# Patient Record
Sex: Female | Born: 1955 | Race: Black or African American | Hispanic: No | Marital: Married | Smoking: Former smoker
Health system: Southern US, Community
[De-identification: ages and names within clinical notes are randomized; demographics above are authoritative.]

## PROBLEM LIST (undated history)

## (undated) DIAGNOSIS — E119 Type 2 diabetes mellitus without complications: Secondary | ICD-10-CM

## (undated) DIAGNOSIS — E118 Type 2 diabetes mellitus with unspecified complications: Secondary | ICD-10-CM

## (undated) DIAGNOSIS — M069 Rheumatoid arthritis, unspecified: Secondary | ICD-10-CM

## (undated) DIAGNOSIS — H269 Unspecified cataract: Secondary | ICD-10-CM

## (undated) DIAGNOSIS — I10 Essential (primary) hypertension: Secondary | ICD-10-CM

## (undated) DIAGNOSIS — E11319 Type 2 diabetes mellitus with unspecified diabetic retinopathy without macular edema: Secondary | ICD-10-CM

## (undated) DIAGNOSIS — M1990 Unspecified osteoarthritis, unspecified site: Secondary | ICD-10-CM

## (undated) HISTORY — DX: Unspecified cataract: H26.9

## (undated) HISTORY — DX: Type 2 diabetes mellitus with unspecified diabetic retinopathy without macular edema: E11.319

## (undated) HISTORY — DX: Type 2 diabetes mellitus with unspecified complications: E11.8

## (undated) HISTORY — PX: OTHER SURGICAL HISTORY: SHX169

---

## 1998-09-16 ENCOUNTER — Encounter: Payer: Self-pay | Admitting: Internal Medicine

## 1998-09-16 ENCOUNTER — Ambulatory Visit (HOSPITAL_COMMUNITY): Admission: RE | Admit: 1998-09-16 | Discharge: 1998-09-16 | Payer: Self-pay | Admitting: Internal Medicine

## 1999-03-02 ENCOUNTER — Inpatient Hospital Stay (HOSPITAL_COMMUNITY): Admission: AD | Admit: 1999-03-02 | Discharge: 1999-03-02 | Payer: Self-pay | Admitting: Obstetrics

## 1999-03-14 ENCOUNTER — Other Ambulatory Visit: Admission: RE | Admit: 1999-03-14 | Discharge: 1999-03-14 | Payer: Self-pay | Admitting: Obstetrics

## 2000-05-04 ENCOUNTER — Inpatient Hospital Stay (HOSPITAL_COMMUNITY): Admission: AD | Admit: 2000-05-04 | Discharge: 2000-05-04 | Payer: Self-pay | Admitting: Obstetrics

## 2003-06-02 ENCOUNTER — Encounter: Admission: RE | Admit: 2003-06-02 | Discharge: 2003-06-02 | Payer: Self-pay | Admitting: Internal Medicine

## 2003-07-09 ENCOUNTER — Encounter: Admission: RE | Admit: 2003-07-09 | Discharge: 2003-07-09 | Payer: Self-pay | Admitting: Internal Medicine

## 2003-07-19 ENCOUNTER — Encounter: Admission: RE | Admit: 2003-07-19 | Discharge: 2003-10-17 | Payer: Self-pay | Admitting: Internal Medicine

## 2003-07-23 ENCOUNTER — Encounter: Admission: RE | Admit: 2003-07-23 | Discharge: 2003-07-23 | Payer: Self-pay | Admitting: Internal Medicine

## 2003-08-16 ENCOUNTER — Encounter: Admission: RE | Admit: 2003-08-16 | Discharge: 2003-08-16 | Payer: Self-pay | Admitting: Internal Medicine

## 2005-02-15 ENCOUNTER — Ambulatory Visit: Payer: Self-pay | Admitting: Internal Medicine

## 2005-03-14 ENCOUNTER — Ambulatory Visit: Payer: Self-pay | Admitting: Internal Medicine

## 2008-08-03 ENCOUNTER — Emergency Department (HOSPITAL_COMMUNITY): Admission: EM | Admit: 2008-08-03 | Discharge: 2008-08-03 | Payer: Self-pay | Admitting: Emergency Medicine

## 2009-08-03 ENCOUNTER — Emergency Department (HOSPITAL_COMMUNITY): Admission: EM | Admit: 2009-08-03 | Discharge: 2009-08-04 | Payer: Self-pay | Admitting: Emergency Medicine

## 2009-10-15 ENCOUNTER — Emergency Department (HOSPITAL_COMMUNITY): Admission: EM | Admit: 2009-10-15 | Discharge: 2009-10-16 | Payer: Self-pay | Admitting: Emergency Medicine

## 2009-10-17 ENCOUNTER — Inpatient Hospital Stay (HOSPITAL_COMMUNITY): Admission: EM | Admit: 2009-10-17 | Discharge: 2009-10-20 | Payer: Self-pay | Admitting: Emergency Medicine

## 2011-02-20 LAB — GLUCOSE, CAPILLARY
Glucose-Capillary: 106 mg/dL — ABNORMAL HIGH (ref 70–99)
Glucose-Capillary: 112 mg/dL — ABNORMAL HIGH (ref 70–99)
Glucose-Capillary: 141 mg/dL — ABNORMAL HIGH (ref 70–99)

## 2011-02-20 LAB — BASIC METABOLIC PANEL
BUN: 6 mg/dL (ref 6–23)
Creatinine, Ser: 0.7 mg/dL (ref 0.4–1.2)
GFR calc non Af Amer: >60 mL/min (ref >60)
Glucose, Bld: 125 mg/dL — ABNORMAL HIGH (ref 70–99)
Potassium: 3.6 mEq/L (ref 3.5–5.1)

## 2011-02-20 LAB — CBC
HCT: 44.1 % (ref 36.0–46.0)
MCV: 89.1 fL (ref 78.0–100.0)
Platelets: 232 10*3/uL (ref 150–400)
RDW: 13.1 % (ref 11.5–15.5)

## 2011-02-21 LAB — COMPREHENSIVE METABOLIC PANEL
ALT: 15 U/L (ref 0–35)
AST: 14 U/L (ref 0–37)
Albumin: 3.8 g/dL (ref 3.5–5.2)
BUN: 9 mg/dL (ref 6–23)
CO2: 27 mEq/L (ref 19–32)
Calcium: 8.8 mg/dL (ref 8.4–10.5)
Calcium: 8.9 mg/dL (ref 8.4–10.5)
Creatinine, Ser: 0.68 mg/dL (ref 0.4–1.2)
GFR calc Af Amer: >60 mL/min (ref >60)
GFR calc non Af Amer: >60 mL/min (ref >60)
Glucose, Bld: 202 mg/dL — ABNORMAL HIGH (ref 70–99)
Glucose, Bld: 218 mg/dL — ABNORMAL HIGH (ref 70–99)
Potassium: 3.5 mEq/L (ref 3.5–5.1)
Sodium: 139 mEq/L (ref 135–145)
Total Protein: 7.9 g/dL (ref 6.0–8.3)

## 2011-02-21 LAB — DIFFERENTIAL
Basophils Absolute: 0 10*3/uL (ref 0.0–0.1)
Basophils Absolute: 0 10*3/uL (ref 0.0–0.1)
Basophils Relative: 0 % (ref 0–1)
Basophils Relative: 0 % (ref 0–1)
Eosinophils Absolute: 0.1 10*3/uL (ref 0.0–0.7)
Eosinophils Relative: 0 % (ref 0–5)
Lymphocytes Relative: 5 % — ABNORMAL LOW (ref 12–46)
Lymphs Abs: 0.6 10*3/uL — ABNORMAL LOW (ref 0.7–4.0)
Neutro Abs: 8.6 10*3/uL — ABNORMAL HIGH (ref 1.7–7.7)
Neutrophils Relative %: 79 % — ABNORMAL HIGH (ref 43–77)
Neutrophils Relative %: 95 % — ABNORMAL HIGH (ref 43–77)

## 2011-02-21 LAB — POCT I-STAT, CHEM 8
BUN: 9 mg/dL (ref 6–23)
Calcium, Ion: 1.11 mmol/L — ABNORMAL LOW (ref 1.12–1.32)
Chloride: 99 mEq/L (ref 96–112)
Glucose, Bld: 206 mg/dL — ABNORMAL HIGH (ref 70–99)
HCT: 48 % — ABNORMAL HIGH (ref 36.0–46.0)
Potassium: 3.5 mEq/L (ref 3.5–5.1)

## 2011-02-21 LAB — BASIC METABOLIC PANEL
BUN: 6 mg/dL (ref 6–23)
CO2: 26 mEq/L (ref 19–32)
Creatinine, Ser: 0.58 mg/dL (ref 0.4–1.2)
GFR calc Af Amer: >60 mL/min (ref >60)
Glucose, Bld: 157 mg/dL — ABNORMAL HIGH (ref 70–99)
Sodium: 136 mEq/L (ref 135–145)

## 2011-02-21 LAB — CBC
HCT: 44.7 % (ref 36.0–46.0)
Hemoglobin: 14.3 g/dL (ref 12.0–15.0)
MCHC: 33.7 g/dL (ref 30.0–36.0)
MCHC: 33.9 g/dL (ref 30.0–36.0)
MCV: 88.9 fL (ref 78.0–100.0)
MCV: 89.2 fL (ref 78.0–100.0)
MCV: 89.8 fL (ref 78.0–100.0)
Platelets: 213 10*3/uL (ref 150–400)
Platelets: 231 10*3/uL (ref 150–400)
RBC: 4.74 MIL/uL (ref 3.87–5.11)
RDW: 12.7 % (ref 11.5–15.5)
RDW: 13.3 % (ref 11.5–15.5)

## 2011-02-21 LAB — URINALYSIS, ROUTINE W REFLEX MICROSCOPIC
Bilirubin Urine: NEGATIVE
Glucose, UA: 500 mg/dL — AB
Nitrite: NEGATIVE
Nitrite: NEGATIVE
Specific Gravity, Urine: 1.019 (ref 1.005–1.030)
Specific Gravity, Urine: 1.021 (ref 1.005–1.030)
pH: 7 (ref 5.0–8.0)
pH: 7.5 (ref 5.0–8.0)

## 2011-02-21 LAB — GLUCOSE, CAPILLARY
Glucose-Capillary: 129 mg/dL — ABNORMAL HIGH (ref 70–99)
Glucose-Capillary: 135 mg/dL — ABNORMAL HIGH (ref 70–99)
Glucose-Capillary: 144 mg/dL — ABNORMAL HIGH (ref 70–99)
Glucose-Capillary: 176 mg/dL — ABNORMAL HIGH (ref 70–99)

## 2011-02-21 LAB — APTT: aPTT: 31 seconds (ref 24–37)

## 2011-02-21 LAB — PROTIME-INR: INR: 1.1 (ref 0.00–1.49)

## 2011-02-21 LAB — URINE CULTURE
Colony Count: NO GROWTH
Culture: NO GROWTH

## 2011-02-21 LAB — HEMOGLOBIN A1C: Hgb A1c MFr Bld: 7.1 % — ABNORMAL HIGH (ref 4.6–6.1)

## 2011-02-21 LAB — LIPASE, BLOOD: Lipase: 15 U/L (ref 11–59)

## 2011-02-23 LAB — BASIC METABOLIC PANEL
BUN: 9 mg/dL (ref 6–23)
CO2: 26 mEq/L (ref 19–32)
Chloride: 106 mEq/L (ref 96–112)
Potassium: 3.7 mEq/L (ref 3.5–5.1)

## 2011-02-23 LAB — DIFFERENTIAL
Eosinophils Absolute: 0.2 10*3/uL (ref 0.0–0.7)
Eosinophils Relative: 2 % (ref 0–5)
Lymphs Abs: 1.9 10*3/uL (ref 0.7–4.0)
Monocytes Relative: 5 % (ref 3–12)

## 2011-02-23 LAB — URINALYSIS, ROUTINE W REFLEX MICROSCOPIC
Bilirubin Urine: NEGATIVE
Glucose, UA: NEGATIVE mg/dL
Hgb urine dipstick: NEGATIVE
Nitrite: POSITIVE — AB
Specific Gravity, Urine: 1.011 (ref 1.005–1.030)
pH: 6.5 (ref 5.0–8.0)

## 2011-02-23 LAB — CBC
HCT: 42.3 % (ref 36.0–46.0)
MCV: 90.1 fL (ref 78.0–100.0)
Platelets: 220 10*3/uL (ref 150–400)
RBC: 4.69 MIL/uL (ref 3.87–5.11)
WBC: 11 10*3/uL — ABNORMAL HIGH (ref 4.0–10.5)

## 2011-02-23 LAB — URINE MICROSCOPIC-ADD ON

## 2011-02-23 LAB — URINE CULTURE

## 2011-08-20 LAB — HEPATIC FUNCTION PANEL
Albumin: 3.7
Total Bilirubin: 0.8
Total Protein: 7.4

## 2011-08-20 LAB — URINALYSIS, ROUTINE W REFLEX MICROSCOPIC
Glucose, UA: NEGATIVE
Ketones, ur: NEGATIVE
Leukocytes, UA: NEGATIVE
Nitrite: POSITIVE — AB
Protein, ur: NEGATIVE
Urobilinogen, UA: 1

## 2011-08-20 LAB — CBC
MCHC: 33.7
MCV: 86.8
Platelets: 236

## 2011-08-20 LAB — DIFFERENTIAL
Basophils Relative: 1
Eosinophils Absolute: 0.2
Neutrophils Relative %: 63

## 2011-08-20 LAB — BASIC METABOLIC PANEL
BUN: 16
CO2: 23
Chloride: 109
Creatinine, Ser: 0.7

## 2011-08-20 LAB — LIPASE, BLOOD: Lipase: 33

## 2012-11-08 ENCOUNTER — Encounter (HOSPITAL_COMMUNITY): Payer: Self-pay | Admitting: *Deleted

## 2012-11-08 ENCOUNTER — Emergency Department (HOSPITAL_COMMUNITY)
Admission: EM | Admit: 2012-11-08 | Discharge: 2012-11-08 | Disposition: A | Discharge status: Home / Self Care | Payer: Self-pay | Attending: Emergency Medicine | Admitting: Emergency Medicine

## 2012-11-08 ENCOUNTER — Emergency Department (HOSPITAL_COMMUNITY): Payer: Self-pay

## 2012-11-08 DIAGNOSIS — F17200 Nicotine dependence, unspecified, uncomplicated: Secondary | ICD-10-CM | POA: Insufficient documentation

## 2012-11-08 DIAGNOSIS — E119 Type 2 diabetes mellitus without complications: Secondary | ICD-10-CM | POA: Insufficient documentation

## 2012-11-08 DIAGNOSIS — M25512 Pain in left shoulder: Secondary | ICD-10-CM

## 2012-11-08 DIAGNOSIS — M25519 Pain in unspecified shoulder: Secondary | ICD-10-CM | POA: Insufficient documentation

## 2012-11-08 DIAGNOSIS — I10 Essential (primary) hypertension: Secondary | ICD-10-CM | POA: Insufficient documentation

## 2012-11-08 DIAGNOSIS — Z8739 Personal history of other diseases of the musculoskeletal system and connective tissue: Secondary | ICD-10-CM | POA: Insufficient documentation

## 2012-11-08 DIAGNOSIS — Z7982 Long term (current) use of aspirin: Secondary | ICD-10-CM | POA: Insufficient documentation

## 2012-11-08 HISTORY — DX: Essential (primary) hypertension: I10

## 2012-11-08 HISTORY — DX: Unspecified osteoarthritis, unspecified site: M19.90

## 2012-11-08 HISTORY — DX: Type 2 diabetes mellitus without complications: E11.9

## 2012-11-08 MED ORDER — IBUPROFEN 800 MG PO TABS: 800.0000 mg | ORAL_TABLET | Freq: Three times a day (TID) | ORAL | Status: DC | PRN

## 2012-11-08 MED ORDER — HYDROCODONE-ACETAMINOPHEN 5-325 MG PO TABS: 1.0000 | ORAL_TABLET | Freq: Four times a day (QID) | ORAL | Status: DC | PRN

## 2012-11-08 MED ORDER — HYDROCODONE-ACETAMINOPHEN 5-325 MG PO TABS
1.0000 | ORAL_TABLET | Freq: Once | ORAL | Status: AC
  Administered 2012-11-08: 1 via ORAL
  Filled 2012-11-08: qty 1

## 2012-11-08 MED ORDER — KETOROLAC TROMETHAMINE 60 MG/2ML IM SOLN
60.0000 mg | Freq: Once | INTRAMUSCULAR | Status: AC
  Administered 2012-11-08: 60 mg via INTRAMUSCULAR
  Filled 2012-11-08: qty 2

## 2012-11-08 NOTE — ED Provider Notes (Signed)
History     CSN: [id number]  Arrival date & time 11/08/12  0114   First MD Initiated Contact with Patient 11/08/12 0139      Chief Complaint  Patient presents with  . Shoulder Pain    Left "shoulder blade"    (Consider location/radiation/quality/duration/timing/severity/associated sxs/prior treatment) HPI Patient presents to the emergency department with left shoulder blade pain. Patient states that she was straining to have a bowel movement 2 days ago, and then noticed shoulder pain several hours later. Patient denies shortness of breath, chest pain, nausea, vomiting, headache, neck pain, weakness, numbness, fever, or syncope.  Patient, states that movement makes the pain, worse.  Nothing seems to make the pain better.  Patient, states she didn't take anything prior to arrival, for her symptoms.  Past Medical History  Diagnosis Date  . Hypertension   . Diabetes mellitus without complication   . Arthritis     History reviewed. No pertinent past surgical history.  History reviewed. No pertinent family history.  History  Substance Use Topics  . Smoking status: Current Every Day Smoker -- 0.5 packs/day  . Smokeless tobacco: Never Used  . Alcohol Use: Yes     Comment: occ    OB History    Grav Para Term Preterm Abortions TAB SAB Ect Mult Living                  Review of Systems All other systems negative except as documented in the HPI. All pertinent positives and negatives as reviewed in the HPI.  Allergies  Review of patient's allergies indicates no known allergies.  Home Medications   Current Outpatient Rx  Name  Route  Sig  Dispense  Refill  . ASPIRIN EC 81 MG PO TBEC   Oral   Take 81 mg by mouth every Monday, Wednesday, and Friday.         . ALEVE PO   Oral   Take 1 tablet by mouth daily as needed. For pain         . PSEUDOEPHEDRINE HCL 30 MG PO TABS   Oral   Take 30 mg by mouth every 4 (four) hours as needed. For sinus         . SALINE 0.65 %  NA SOLN   Nasal   Place 1 spray into the nose daily as needed. For nasal drainage           BP 206/109  Pulse 92  Temp 98.5 F (36.9 C) (Oral)  Resp 15  Ht 5\' 3"  (1.6 m)  Wt 162 lb (73.483 kg)  BMI 28.70 kg/m2  SpO2 100%  Physical Exam  Constitutional: She is oriented to person, place, and time. She appears well-developed and well-nourished. No distress.  HENT:  Head: Normocephalic and atraumatic.  Eyes: Pupils are equal, round, and reactive to light.  Cardiovascular: Normal rate, regular rhythm and normal heart sounds.  Exam reveals no gallop and no friction rub.   No murmur heard. Pulmonary/Chest: Effort normal and breath sounds normal. No respiratory distress. She has no wheezes. She has no rales.  Musculoskeletal:       Arms: Neurological: She is alert and oriented to person, place, and time. She has normal strength. Coordination and gait normal. GCS eye subscore is 4. GCS verbal subscore is 5. GCS motor subscore is 6.  Skin: Skin is warm and dry. No rash noted.    ED Course  Procedures (including critical care time)  Labs Reviewed - No  data to display Dg Chest 2 View  11/08/2012  *RADIOLOGY REPORT*  Clinical Data: Left shoulder pain  CHEST - 2 VIEW  Comparison: 11/23/2009  Findings: Cardiomediastinal contours are unchanged, within normal range.  No confluent airspace opacity, pleural effusion, or pneumothorax.  No acute osseous finding.  IMPRESSION: No radiographic evidence of acute cardiopulmonary process.   Original Report Authenticated By: Jearld Lesch, M.D.      Patient be treated for pain involving the area of her left shoulder blade. the patient is  PERC negative and does not have any chest pain. The patient states that movement makes the pain worse. This seems most likely a musculoskeletal issue of her left shoulder and shoulder blade.   MDM   Date: 11/08/2012  Rate: 89  Rhythm: normal sinus rhythm  QRS Axis: normal  Intervals: normal  ST/T Wave  abnormalities: normal  Conduction Disutrbances:LVH  Narrative Interpretation:   Old EKG Reviewed: none available          Carlyle Dolly, PA-C 11/08/12 0350

## 2012-11-08 NOTE — ED Provider Notes (Signed)
Medical screening examination/treatment/procedure(s) were performed by non-physician practitioner and as supervising physician I was immediately available for consultation/collaboration.   Lyanne Co, MD 11/08/12 [phone]

## 2012-11-08 NOTE — ED Notes (Signed)
Pt from home with reports of left shoulder blade pain that started yesterday. Pt denies other symptoms but reports that she has been constipated with straining over the last few days and wonders if she has pulled a muscle. Pt reports hx of high blood pressure but recently lost health insurance so has not been able to afford medication over the last 6 months.

## 2016-11-29 ENCOUNTER — Encounter (HOSPITAL_COMMUNITY): Payer: Self-pay

## 2016-11-29 ENCOUNTER — Emergency Department (HOSPITAL_COMMUNITY)
Admission: EM | Admit: 2016-11-29 | Discharge: 2016-11-30 | Disposition: A | Discharge status: Home / Self Care | Payer: Medicare Other | Attending: Emergency Medicine | Admitting: Emergency Medicine

## 2016-11-29 ENCOUNTER — Emergency Department (HOSPITAL_COMMUNITY): Payer: Medicare Other

## 2016-11-29 DIAGNOSIS — F17200 Nicotine dependence, unspecified, uncomplicated: Secondary | ICD-10-CM | POA: Insufficient documentation

## 2016-11-29 DIAGNOSIS — Z79899 Other long term (current) drug therapy: Secondary | ICD-10-CM | POA: Insufficient documentation

## 2016-11-29 DIAGNOSIS — R110 Nausea: Secondary | ICD-10-CM | POA: Diagnosis not present

## 2016-11-29 DIAGNOSIS — I10 Essential (primary) hypertension: Secondary | ICD-10-CM | POA: Diagnosis not present

## 2016-11-29 DIAGNOSIS — E119 Type 2 diabetes mellitus without complications: Secondary | ICD-10-CM | POA: Diagnosis not present

## 2016-11-29 DIAGNOSIS — R1033 Periumbilical pain: Secondary | ICD-10-CM | POA: Diagnosis present

## 2016-11-29 DIAGNOSIS — R1084 Generalized abdominal pain: Secondary | ICD-10-CM | POA: Insufficient documentation

## 2016-11-29 DIAGNOSIS — Z7982 Long term (current) use of aspirin: Secondary | ICD-10-CM | POA: Diagnosis not present

## 2016-11-29 DIAGNOSIS — F17210 Nicotine dependence, cigarettes, uncomplicated: Secondary | ICD-10-CM | POA: Diagnosis not present

## 2016-11-29 DIAGNOSIS — R112 Nausea with vomiting, unspecified: Secondary | ICD-10-CM | POA: Diagnosis not present

## 2016-11-29 LAB — CBC WITH DIFFERENTIAL/PLATELET
BASOS ABS: 0 10*3/uL (ref 0.0–0.1)
BASOS PCT: 0 %
Eosinophils Absolute: 0 10*3/uL (ref 0.0–0.7)
Eosinophils Relative: 0 %
HEMATOCRIT: 44.1 % (ref 36.0–46.0)
HEMOGLOBIN: 14.9 g/dL (ref 12.0–15.0)
Lymphocytes Relative: 7 %
Lymphs Abs: 0.9 10*3/uL (ref 0.7–4.0)
MCH: 28.5 pg (ref 26.0–34.0)
MCHC: 33.8 g/dL (ref 30.0–36.0)
MCV: 84.5 fL (ref 78.0–100.0)
Monocytes Absolute: 0.2 10*3/uL (ref 0.1–1.0)
Monocytes Relative: 2 %
NEUTROS ABS: 11.6 10*3/uL — AB (ref 1.7–7.7)
NEUTROS PCT: 91 %
Platelets: 264 10*3/uL (ref 150–400)
RBC: 5.22 MIL/uL — ABNORMAL HIGH (ref 3.87–5.11)
RDW: 14.1 % (ref 11.5–15.5)
WBC: 12.7 10*3/uL — AB (ref 4.0–10.5)

## 2016-11-29 LAB — COMPREHENSIVE METABOLIC PANEL
ALBUMIN: 4 g/dL (ref 3.5–5.0)
ALK PHOS: 120 U/L (ref 38–126)
ALT: 11 U/L — AB (ref 14–54)
ANION GAP: 13 (ref 5–15)
AST: 19 U/L (ref 15–41)
BUN: 13 mg/dL (ref 6–20)
CALCIUM: 9.9 mg/dL (ref 8.9–10.3)
CHLORIDE: 102 mmol/L (ref 101–111)
CO2: 23 mmol/L (ref 22–32)
Creatinine, Ser: 0.95 mg/dL (ref 0.44–1.00)
GFR calc Af Amer: >60 mL/min (ref >60)
GFR calc non Af Amer: >60 mL/min (ref >60)
GLUCOSE: 265 mg/dL — AB (ref 65–99)
Potassium: 3.7 mmol/L (ref 3.5–5.1)
SODIUM: 138 mmol/L (ref 135–145)
Total Bilirubin: 1 mg/dL (ref 0.3–1.2)
Total Protein: 9 g/dL — ABNORMAL HIGH (ref 6.5–8.1)

## 2016-11-29 LAB — URINALYSIS, ROUTINE W REFLEX MICROSCOPIC
Bilirubin Urine: NEGATIVE
Glucose, UA: >=500 mg/dL — AB
KETONES UR: 20 mg/dL — AB
Leukocytes, UA: NEGATIVE
Nitrite: NEGATIVE
PROTEIN: 100 mg/dL — AB
Specific Gravity, Urine: 1.015 (ref 1.005–1.030)
pH: 6 (ref 5.0–8.0)

## 2016-11-29 LAB — I-STAT TROPONIN, ED: Troponin i, poc: 0 ng/mL (ref 0.00–0.08)

## 2016-11-29 LAB — CBC
HEMATOCRIT: 42.4 % (ref 36.0–46.0)
Hemoglobin: 14.3 g/dL (ref 12.0–15.0)
MCH: 28.8 pg (ref 26.0–34.0)
MCHC: 33.7 g/dL (ref 30.0–36.0)
MCV: 85.5 fL (ref 78.0–100.0)
Platelets: 275 10*3/uL (ref 150–400)
RBC: 4.96 MIL/uL (ref 3.87–5.11)
RDW: 14.2 % (ref 11.5–15.5)
WBC: 10.9 10*3/uL — ABNORMAL HIGH (ref 4.0–10.5)

## 2016-11-29 LAB — LIPASE, BLOOD: Lipase: 22 U/L (ref 11–51)

## 2016-11-29 LAB — SEDIMENTATION RATE: SED RATE: 80 mm/h — AB (ref 0–22)

## 2016-11-29 MED ORDER — ONDANSETRON 4 MG PO TBDP
ORAL_TABLET | ORAL | Status: DC
Start: 2016-11-29 — End: 2016-11-30
  Filled 2016-11-29: qty 1

## 2016-11-29 MED ORDER — IOPAMIDOL (ISOVUE-300) INJECTION 61%
INTRAVENOUS | Status: AC
  Administered 2016-11-29: 100 mL
  Filled 2016-11-29: qty 100

## 2016-11-29 MED ORDER — DIPHENHYDRAMINE HCL 50 MG/ML IJ SOLN
12.5000 mg | Freq: Once | INTRAMUSCULAR | Status: AC
  Administered 2016-11-29: 12.5 mg via INTRAVENOUS
  Filled 2016-11-29: qty 1

## 2016-11-29 MED ORDER — METOCLOPRAMIDE HCL 10 MG PO TABS: 5.0000 mg | ORAL_TABLET | Freq: Four times a day (QID) | ORAL | 0 refills | Status: DC | PRN

## 2016-11-29 MED ORDER — AMLODIPINE BESYLATE 10 MG PO TABS: 10.0000 mg | ORAL_TABLET | Freq: Every day | ORAL | 0 refills | Status: DC

## 2016-11-29 MED ORDER — AMLODIPINE BESYLATE 5 MG PO TABS
10.0000 mg | ORAL_TABLET | Freq: Once | ORAL | Status: AC
  Administered 2016-11-29: 10 mg via ORAL
  Filled 2016-11-29: qty 2

## 2016-11-29 MED ORDER — ONDANSETRON 4 MG PO TBDP
4.0000 mg | ORAL_TABLET | Freq: Once | ORAL | Status: AC | PRN
  Administered 2016-11-29: 4 mg via ORAL

## 2016-11-29 MED ORDER — DIPHENHYDRAMINE HCL 25 MG PO TABS: 12.5000 mg | ORAL_TABLET | Freq: Four times a day (QID) | ORAL | 0 refills | Status: DC | PRN

## 2016-11-29 MED ORDER — ONDANSETRON HCL 4 MG/2ML IJ SOLN
4.0000 mg | Freq: Four times a day (QID) | INTRAMUSCULAR | Status: DC | PRN
  Administered 2016-11-29: 4 mg via INTRAVENOUS
  Filled 2016-11-29: qty 2

## 2016-11-29 MED ORDER — METOCLOPRAMIDE HCL 5 MG/ML IJ SOLN
5.0000 mg | Freq: Once | INTRAMUSCULAR | Status: AC
  Administered 2016-11-29: 5 mg via INTRAVENOUS
  Filled 2016-11-29: qty 2

## 2016-11-29 NOTE — ED Provider Notes (Signed)
Patient was given 10 mg of Norvasc in the emergency department, after which she was monitored for a period time, her blood pressure came down to 163/90 at time of discharge, she was also given 5 mg of Reglan and 12.5 mg of Benadryl IV for persistent nausea with resolution of her symptoms.  She is sent home with prescriptions for all of these medicines by mouth.  She and her family state that she will follow-up with Dr. Eula Listen next week   Earley Favor, NP 11/29/16 2351    Nira Conn, MD 12/03/16 [phone]

## 2016-11-29 NOTE — ED Triage Notes (Signed)
Per Pt, Pt is coming from home with complaints of left abdominal pain since yesterday with nausea and vomiting that started today. Reports some diarrhea.

## 2016-11-29 NOTE — ED Notes (Signed)
Pt pulled out iv.

## 2016-11-29 NOTE — Discharge Instructions (Addendum)
It is very important that you take care of blood pressure medicine daily.  You've also been given prescription for medication called Reglan.  You can take this as needed every 6 hours for nausea.  I recommend that you take it with a Benadryl as well

## 2016-11-29 NOTE — ED Provider Notes (Signed)
[facility]-EMERGENCY DEPT Provider Note   CSN: [id number] Arrival date & time: 11/29/16  1447     History   Chief Complaint Chief Complaint  Patient presents with  . Abdominal Pain    HPI Maria Foster is a 61 y.o. female presenting with abdominal pain which started 24 hours ago and has been worsening she states that she woke up this morning and has had multiple nonbloody soft bowel movements and vomiting. She cannot point to a specific area of her stomach or it hurts. She generally points to the periumbilical area. It is worse when she sits up and moves slightly better when she lies on her side. She also endorses sinus congestion, chills and states that she is achy all over. She has not tried anything for pain and hasn't had any antipyretics today. She denies any history of ill contacts. Denies melena, dysuria, hematuria, chest pain, shortness of breath, or blood in her stool.  She states that her hypertension and diabetes had been managed with lifestyle changes she is not currently taking any medications. Her last PCP visit was more than a year ago.  HPI  Past Medical History:  Diagnosis Date  . Arthritis   . Diabetes mellitus without complication (HCC)   . Hypertension     There are no active problems to display for this patient.   History reviewed. No pertinent surgical history.  OB History    No data available       Home Medications    Prior to Admission medications   Medication Sig Start Date End Date Taking? Authorizing Provider  aspirin EC 81 MG tablet Take 81 mg by mouth every Monday, Wednesday, and Friday.   Yes Historical Provider, MD  naproxen sodium (ANAPROX) 220 MG tablet Take 220 mg by mouth daily as needed (pain).   Yes Historical Provider, MD    Family History No family history on file.  Social History Social History  Substance Use Topics  . Smoking status: Current Every Day Smoker    Packs/day: 0.50  . Smokeless tobacco: Never Used  . Alcohol use  Yes     Comment: occ     Allergies   Patient has no known allergies.   Review of Systems Review of Systems  Constitutional: Negative for chills and fever.  HENT: Positive for congestion and sinus pressure. Negative for ear pain, facial swelling, hearing loss, sinus pain, sneezing, sore throat and trouble swallowing.   Eyes: Negative for pain and visual disturbance.  Respiratory: Negative for cough, chest tightness, shortness of breath, wheezing and stridor.   Cardiovascular: Negative for chest pain, palpitations and leg swelling.  Gastrointestinal: Positive for abdominal pain, nausea and vomiting. Negative for abdominal distention, blood in stool and diarrhea.  Genitourinary: Negative for difficulty urinating, dysuria, flank pain, frequency, hematuria, pelvic pain and urgency.  Musculoskeletal: Positive for myalgias. Negative for arthralgias, back pain, neck pain and neck stiffness.  Skin: Negative for color change, pallor, rash and wound.  Neurological: Negative for dizziness, seizures, syncope, facial asymmetry, weakness, light-headedness, numbness and headaches.  All other systems reviewed and are negative.    Physical Exam Updated Vital Signs BP (!) 188/113 (BP Location: Left Arm)   Pulse 97   Temp 97.7 F (36.5 C) (Oral)   Resp 17   Ht 5\' 4"  (1.626 m)   Wt 73.5 kg   SpO2 100%   BMI 27.81 kg/m   Physical Exam  Constitutional: She appears well-developed and well-nourished. No distress.  Afebrile, nontoxic  appearing, lying in bed in discomfort  HENT:  Head: Normocephalic and atraumatic.  Mouth/Throat: Oropharynx is clear and moist. No oropharyngeal exudate.  No sinus tenderness  Eyes: Conjunctivae and EOM are normal. Pupils are equal, round, and reactive to light. Right eye exhibits no discharge. Left eye exhibits no discharge.  Neck: Normal range of motion. Neck supple.  Cardiovascular: Normal rate, regular rhythm and normal heart sounds.   No murmur  heard. Pulmonary/Chest: Effort normal and breath sounds normal. No respiratory distress. She has no wheezes. She has no rales.  Abdominal: Soft. Bowel sounds are normal. She exhibits no distension and no mass. There is tenderness. There is no rebound and no guarding.  Patient reports diffuse discomfort in her abdominal area. Negative Murphy sign, negative McBurney's point tenderness negative rebound.  Musculoskeletal: She exhibits no edema.  Neurological: She is alert. No cranial nerve deficit or sensory deficit. She exhibits normal muscle tone. Coordination normal.  Skin: Skin is warm and dry. No rash noted. She is not diaphoretic. No erythema. No pallor.  Psychiatric: She has a normal mood and affect. Her behavior is normal.  Nursing note and vitals reviewed.    ED Treatments / Results  Labs (all labs ordered are listed, but only abnormal results are displayed) Labs Reviewed  COMPREHENSIVE METABOLIC PANEL - Abnormal; Notable for the following:       Result Value   Glucose, Bld 265 (*)    Total Protein 9.0 (*)    ALT 11 (*)    All other components within normal limits  CBC - Abnormal; Notable for the following:    WBC 10.9 (*)    All other components within normal limits  URINALYSIS, ROUTINE W REFLEX MICROSCOPIC - Abnormal; Notable for the following:    APPearance HAZY (*)    Glucose, UA >=500 (*)    Hgb urine dipstick SMALL (*)    Ketones, ur 20 (*)    Protein, ur 100 (*)    Bacteria, UA RARE (*)    Squamous Epithelial / LPF 0-5 (*)    All other components within normal limits  CBC WITH DIFFERENTIAL/PLATELET - Abnormal; Notable for the following:    WBC 12.7 (*)    RBC 5.22 (*)    Neutro Abs 11.6 (*)    All other components within normal limits  LIPASE, BLOOD  SEDIMENTATION RATE  I-STAT TROPOININ, ED    EKG  EKG Interpretation None       Radiology Ct Abdomen Pelvis W Contrast  Result Date: 11/29/2016 CLINICAL DATA:  Diffuse abdominal pain with nausea vomiting.  EXAM: CT ABDOMEN AND PELVIS WITH CONTRAST TECHNIQUE: Multidetector CT imaging of the abdomen and pelvis was performed using the standard protocol following bolus administration of intravenous contrast. CONTRAST:  ISOVUE-300 IOPAMIDOL (ISOVUE-300) INJECTION 61% COMPARISON:  10/17/2009 FINDINGS: Lower chest:  Unremarkable. Hepatobiliary: Small area of low attenuation in the anterior liver, adjacent to the falciform ligament, is in a characteristic location for focal fatty deposition and is unchanged in the interval. Liver parenchyma otherwise unremarkable. There is no evidence for gallstones, gallbladder wall thickening, or pericholecystic fluid. No intrahepatic or extrahepatic biliary dilation. Pancreas: No focal mass lesion. No dilatation of the main duct. No intraparenchymal cyst. No peripancreatic edema. Spleen: No splenomegaly. No focal mass lesion. Adrenals/Urinary Tract: No adrenal nodule or mass. The kidneys are unremarkable. No evidence for hydroureter. The urinary bladder appears normal for the degree of distention. Stomach/Bowel: Tiny hiatal hernia. Stomach unremarkable. Duodenum is normally positioned as is  the ligament of Treitz. No small bowel wall thickening. No small bowel dilatation. The terminal ileum is normal. The appendix is normal. No gross colonic mass. No colonic wall thickening. No substantial diverticular change. Vascular/Lymphatic: There is abdominal aortic atherosclerosis without aneurysm. There is a cuff of soft tissue attenuation encasing the proximal superior mesenteric artery with some apparent thickening in the wall of the proximal SMA (well seen on axial image 22 of series 201 and coronal image 36 of series 206). Celiac axis appears largely preserved than the IMA has normal imaging features. Reproductive: The uterus has normal CT imaging appearance. There is no adnexal mass. Other: No intraperitoneal free fluid. Musculoskeletal: Bone windows reveal no worrisome lytic or  sclerotic osseous lesions. IMPRESSION: 1. Circumferential cuff of soft tissue attenuation with apparent wall thickening in the proximal SMA. These imaging features can be seen in cases of SMA vasculitis. This appearance can also be seen in cases of pancreatic adenocarcinoma, presenting with only abnormal soft tissue encasing the celiac axis or SMA and no identifiable intra pancreatic lesion. Close follow-up is recommended I discussed these results by telephone with Mathews Robinsons, PA , at approximately 2126 hours on 11/29/2016. Electronically Signed   By: Kennith Center M.D.   On: 11/29/2016 21:26    Procedures Procedures (including critical care time)  Medications Ordered in ED Medications  ondansetron (ZOFRAN-ODT) 4 MG disintegrating tablet (not administered)  amLODipine (NORVASC) tablet 10 mg (not administered)  ondansetron (ZOFRAN) injection 4 mg (4 mg Intravenous Given 11/29/16 2106)  ondansetron (ZOFRAN-ODT) disintegrating tablet 4 mg (4 mg Oral Given 11/29/16 1529)  iopamidol (ISOVUE-300) 61 % injection (100 mLs  Contrast Given 11/29/16 2030)     Initial Impression / Assessment and Plan / ED Course  I have reviewed the triage vital signs and the nursing notes.  Pertinent labs & imaging results that were available during my care of the patient were reviewed by me and considered in my medical decision making (see chart for details).  61 year old female presenting with 1 day of abdominal discomfort and multiple episodes of vomiting and non-bloody soft stool. Hypertension and diabetes not currently being managed and last PCP visit was more than a year ago.  Noted elevated blood pressure and elevated glucose in the Ed. Emphasized the importance of maintaining contact with PCP for management of Blood glucose and hypertension. Given 10mg  norvasc. Ordered set of vitals rechecked.  21:25- spoke to radiologist from CT who states that she has soft tissue surrounding her SMA which may be due to a  vasculitis or sometimes is the only sign of pancreatic cancer. Recommended obtaining a Sed rate and probably follow up with GI with emphasis on ensuring close follow up. Spoke with family regarding Ct results and need for follow up.  Discussed strict return precautions. Patient was advised to return to the emergency department if experiencing any worsening of symptoms. She understood instructions and agreed with anticipated discharge plan.  Transferred patient care at end of shift to Sabino Dick NP pending sed rate, ekg and troponin, bp recheck and PO trial. Anticipate discharge home with follow up with GI and close PCP follow up for management of chronic DM and HTN if all negative.  Patient was discussed with Dr. Eudelia Bunch who agrees with assessment and plan.  Final Clinical Impressions(s) / ED Diagnoses   Final diagnoses:  Nausea  Generalized abdominal pain    New Prescriptions New Prescriptions   No medications on file     Georgiana Shore, PA-C  11/29/16 2219    Nira Conn, MD 12/04/16 0000

## 2016-11-29 NOTE — ED Notes (Signed)
Pt transported to CT ?

## 2016-12-03 DIAGNOSIS — R112 Nausea with vomiting, unspecified: Secondary | ICD-10-CM | POA: Diagnosis not present

## 2016-12-03 DIAGNOSIS — R1084 Generalized abdominal pain: Secondary | ICD-10-CM | POA: Diagnosis not present

## 2016-12-03 DIAGNOSIS — R933 Abnormal findings on diagnostic imaging of other parts of digestive tract: Secondary | ICD-10-CM | POA: Diagnosis not present

## 2017-01-07 DIAGNOSIS — R1084 Generalized abdominal pain: Secondary | ICD-10-CM | POA: Diagnosis not present

## 2017-01-07 DIAGNOSIS — R933 Abnormal findings on diagnostic imaging of other parts of digestive tract: Secondary | ICD-10-CM | POA: Diagnosis not present

## 2017-01-07 DIAGNOSIS — R112 Nausea with vomiting, unspecified: Secondary | ICD-10-CM | POA: Diagnosis not present

## 2017-07-16 DIAGNOSIS — H11152 Pinguecula, left eye: Secondary | ICD-10-CM | POA: Diagnosis not present

## 2017-07-16 DIAGNOSIS — E113593 Type 2 diabetes mellitus with proliferative diabetic retinopathy without macular edema, bilateral: Secondary | ICD-10-CM | POA: Diagnosis not present

## 2017-07-16 DIAGNOSIS — H4313 Vitreous hemorrhage, bilateral: Secondary | ICD-10-CM | POA: Diagnosis not present

## 2017-07-16 DIAGNOSIS — H25813 Combined forms of age-related cataract, bilateral: Secondary | ICD-10-CM | POA: Diagnosis not present

## 2017-07-23 ENCOUNTER — Encounter (INDEPENDENT_AMBULATORY_CARE_PROVIDER_SITE_OTHER): Payer: Self-pay | Admitting: Ophthalmology

## 2017-07-23 ENCOUNTER — Ambulatory Visit (INDEPENDENT_AMBULATORY_CARE_PROVIDER_SITE_OTHER): Payer: Medicare Other | Admitting: Ophthalmology

## 2017-07-23 DIAGNOSIS — H4312 Vitreous hemorrhage, left eye: Secondary | ICD-10-CM

## 2017-07-23 DIAGNOSIS — E113593 Type 2 diabetes mellitus with proliferative diabetic retinopathy without macular edema, bilateral: Secondary | ICD-10-CM

## 2017-07-23 DIAGNOSIS — H25813 Combined forms of age-related cataract, bilateral: Secondary | ICD-10-CM | POA: Diagnosis not present

## 2017-07-23 MED ORDER — PREDNISOLONE ACETATE 1 % OP SUSP: 1.0000 [drp] | Freq: Four times a day (QID) | OPHTHALMIC | 0 refills | Status: DC

## 2017-07-23 NOTE — Progress Notes (Signed)
[facility] - Clinic Note  07/23/2017     CHIEF COMPLAINT Patient presents for Retina Evaluation and Diabetic Eye Exam   HISTORY OF PRESENT ILLNESS: Maria Foster is a 61 y.o. female who presents to the clinic today for:   HPI    Retina Evaluation  In left eye.  This started 1 week ago.  Duration of 1 week (Pt states that floater is constant).  Associated Symptoms Floaters and Blind Spot.  Negative for Flashes, Pain, Distortion, Redness, Trauma, Shoulder/Hip pain, Fatigue, Glare, Jaw Claudication, Weight Loss, Photophobia, Scalp Tenderness and Fever.  Context:  distance vision, mid-range vision, near vision, reading, watching TV, computer work, driving, night driving and dim lighting.  Treatments tried include no treatments.  I, the attending physician,  performed the HPI with the patient and updated documentation appropriately.        Diabetic Eye Exam  Vision is stable.  Associated Symptoms Floaters and Blind Spot.  Negative for Flashes, Photophobia, Fever, Scalp Tenderness, Pain, Glare, Jaw Claudication, Weight Loss, Distortion, Redness, Trauma, Shoulder/Hip pain and Fatigue.  Diabetes characteristics include Type 2.  This started 10 years ago.  Blood sugar level is controlled.  Last Blood Glucose 190.  Last A1C: unknown.  I, the attending physician,  performed the HPI with the patient and updated documentation appropriately.        Comments  Pt states that last week she woke up with a floater OS; Pt states that floater is constant and moves with her; Pt denies flashes, denies wavy VA, denies ocular pain; Pt denies gtts usage, denies eye vits;   Pt states that she has not been seeing a PCP for a few years due to lack of insurance. Is now employed and is scheduled to see her PCP tomorrow.     Last edited by Rennis Chris, MD on 07/23/2017 10:36 AM. (History)      Referring physician: Sallye Lat, MD [address] [place], [place]  [id number]  HISTORICAL INFORMATION:   Selected notes from the MEDICAL RECORD NUMBER Referral from Dr. Sallye Lat for PDR OU w/ [facility] OU. - pt with history of diet controlled DMII, HTN, RA - initially presented to Dr. Dione Booze on 8.28.18 for recent onset floater OS.   CURRENT MEDICATIONS: Current Outpatient Prescriptions (Ophthalmic Drugs)  Medication Sig  . prednisoLONE acetate (PRED FORTE) 1 % ophthalmic suspension Place 1 drop into the right eye 4 (four) times daily.   No current facility-administered medications for this visit.  (Ophthalmic Drugs)   Current Outpatient Prescriptions (Other)  Medication Sig  . amLODipine (NORVASC) 10 MG tablet Take 1 tablet (10 mg total) by mouth daily. (Patient not taking: Reported on 07/23/2017)  . aspirin EC 81 MG tablet Take 81 mg by mouth every Monday, Wednesday, and Friday.  . diphenhydrAMINE (BENADRYL) 25 MG tablet Take 0.5 tablets (12.5 mg total) by mouth every 6 (six) hours as needed (take with Reglan). (Patient not taking: Reported on 07/23/2017)  . metoCLOPramide (REGLAN) 10 MG tablet Take 0.5 tablets (5 mg total) by mouth every 6 (six) hours as needed for nausea. (Patient not taking: Reported on 07/23/2017)  . naproxen sodium (ANAPROX) 220 MG tablet Take 220 mg by mouth daily as needed (pain).   No current facility-administered medications for this visit.  (Other)      REVIEW OF SYSTEMS: ROS    Positive for: Endocrine, Eyes   Negative for: Constitutional, Gastrointestinal, Neurological, Skin, Genitourinary, Musculoskeletal, HENT, Cardiovascular, Respiratory,  Psychiatric, Allergic/Imm, Heme/Lymph   Last edited by Rennis Chris, MD on 07/23/2017  9:59 AM. (History)       ALLERGIES No Known Allergies  PAST MEDICAL HISTORY Past Medical History:  Diagnosis Date  . Arthritis   . Cataract   . Diabetes mellitus without complication (HCC)   . Hypertension    History reviewed. No pertinent surgical history.  FAMILY HISTORY Family History   Problem Relation Age of Onset  . Amblyopia Neg Hx   . Blindness Neg Hx   . Cataracts Neg Hx   . Glaucoma Neg Hx   . Macular degeneration Neg Hx   . Retinal detachment Neg Hx   . Strabismus Neg Hx   . Retinitis pigmentosa Neg Hx     SOCIAL HISTORY Social History  Substance Use Topics  . Smoking status: Current Every Day Smoker    Packs/day: 0.50  . Smokeless tobacco: Never Used  . Alcohol use Yes     Comment: occ         OPHTHALMIC EXAM:  Base Eye Exam    Visual Acuity (Snellen - Linear)      Right Left   Dist [place] 20/25 20/30   Dist ph [place] 20/20 20/20       Tonometry (Applanation, 9:23 AM)      Right Left   Pressure 17 19       Pupils      Dark Light Shape React APD   Right 3 2 Round Sluggish None   Left 3 2 Round Sluggish None       Visual Fields (Counting fingers)      Left Right    Full Full       Extraocular Movement      Right Left    Full, Ortho Full, Ortho       Neuro/Psych    Oriented x3:  Yes       Dilation    Both eyes:  1.0% Mydriacyl, 2.5% Phenylephrine @ 9:23 AM   Phen 10% instilled in OU @ 9:41am        Slit Lamp and Fundus Exam    External Exam      Right Left   External Normal Normal       Slit Lamp Exam      Right Left   Lids/Lashes Normal Normal   Conjunctiva/Sclera Temporal and Nasal Pinguecula; racial melanosis Temporal and Nasal Pinguecula; racial melanosis   Cornea Clear Clear   Anterior Chamber Deep and quiet Deep and quiet   Iris dilated to 69mm; no NVI dilated to 6.31mm; no NVI   Lens 2+ Nuclear sclerosis, 3+ Cortical cataract 2+ Nuclear sclerosis, 3+ Cortical cataract   Vitreous Vitreous syneresis Vitreous syneresis; small clumps of VH inferiorlly        Fundus Exam      Right Left   Disc fine NVD Normal; sharp   C/D Ratio 0.6 0.5   Macula DBH; early NVE temporally cotton wool spots sup; DBH; temporal NVE   Vessels NVE along inferior arcade, superonasal to disc  NVE at 1030   Periphery DBH; attached;  DBH;  attached        Refraction    Manifest Refraction      Sphere Cylinder Dist VA   Right -0.50 Sphere 20/20   Left [place]  20/25          IMAGING AND PROCEDURES  Imaging and Procedures for 07/23/17  OCT, Retina - OU - Both Eyes  Right Eye Central Foveal Thickness: 257. Progression has no prior data. Findings include normal foveal contour, no IRF, no SRF (Mild inf edema).   Left Eye Central Foveal Thickness: 266. Progression has no prior data. Findings include normal foveal contour, no IRF, no SRF.   Notes Images taken, stored on drive   Diagnosis / Impression:  No DME OU  Clinical management:  See below  Abbreviations: NFP - Normal foveal profile. CME - cystoid macular edema. PED - pigment epithelial detachment. IRF - intraretinal fluid. SRF - subretinal fluid. EZ - ellipsoid zone. ERM - epiretinal membrane. ORA - outer retinal atrophy. ORT - outer retinal tubulation. SRHM - subretinal hyper-reflective material       Fluorescein Angiography Optos (Transit OS)     Right Eye Progression has no prior data. Early phase findings include neovascularization disc, microaneurysm, retinal neovascularization, vascular perfusion defect. Mid/Late phase findings include microaneurysm, neovascularization disc, retinal neovascularization, vascular perfusion defect.   Left Eye Progression has no prior data. Early phase findings include microaneurysm, retinal neovascularization, vascular perfusion defect, neovascularization disc. Mid/Late phase findings include microaneurysm, neovascularization disc, retinal neovascularization, vascular perfusion defect.      Panretinal Photocoagulation - OD - Right Eye     Time Out Confirmed correct patient, procedure, site, and patient consented.   Notes LASER PROCEDURE NOTE  Diagnosis:   Proliferative Diabetic Retinopathy, RIGHT EYE  Procedure:  Pan-retinal photocoagulation using slit lamp laser, RIGHT EYE  Anesthesia:   Topical  Surgeon: Rennis Chris, MD, PhD   Informed consent obtained, operative eye marked, and time out performed prior to initiation of laser.   Lumenis [id number] slit lamp laser Pattern: 2x2 square, 3x3 square Power: 240 mW Duration: 30 msec  Spot size: 200 microns  # spots: 1848 spots nasal hemisphere  Complications: None.  Notes: cortical cataract obscuring some peripheral view and preventing laser up take peripherally  RTC: 2 wks for PRP, left eye                ASSESSMENT/PLAN:    ICD-10-CM   1. 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 Vitreous hemorrhage of left eye (HCC) H43.12 Fluorescein Angiography Optos (Transit OS)  3. 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  Proliferative diabetic retinopathy, both eyes The incidence, risk factors for progression, natural history and treatment options for diabetic retinopathy  were discussed with patient.  The need for close monitoring of blood glucose, blood pressure, and serum lipids, avoiding cigarette or any type of tobacco, and the need for long term follow up was also discussed with patient. - significant NVD and NVE OU on exam -- confirmed by fluorescein angiography - significant peripheral nonperfusion / capillary drop out - No CSME, both eyes  - recommend PRP OU -- OD first today 9.4.18 - see procedure note above - start PF QID OD x7 days - f/u in 2 wks for PRP OS (will use subconj lido block), then 2 wks for PRP fill in OD  2. Vitreous hemorrhage OS secondary to #1 above - causing pts symptoms - discussed findings and prognosis - VH precautions reviewed -- minimize activities, keep head elevated, avoid ASA/NSAIDs/blood thinners as able  3. NSC, CC OU - being monitored by Dr.  Sallye Lat - The symptoms  of cataract, surgical options, and treatments and risks were discussed with patient. - discussed diagnosis and progression - not yet visually significant - continue to monitor for now   Ophthalmic Meds Ordered this visit:  Meds ordered this encounter  Medications  . prednisoLONE acetate (PRED FORTE) 1 % ophthalmic suspension    Sig: Place 1 drop into the right eye 4 (four) times daily.    Dispense:  15 mL    Refill:  0       Return in about 2 weeks (around 08/06/2017) for Laser - PRP OS.  There are no Patient Instructions on file for this visit.   Explained the diagnoses, plan, and follow up with the patient and they expressed understanding.  Patient expressed understanding of the importance of proper follow up care.   Karie Chimera, M.D., Ph.D. Vitreoretinal Surgeon [facility] 07/23/17     Abbreviations: M myopia (nearsighted); A astigmatism; H hyperopia (farsighted); P presbyopia; Mrx spectacle prescription;  CTL contact lenses; OD right eye; OS left eye; OU both eyes  XT exotropia; ET esotropia; PEK punctate epithelial keratitis; PEE punctate epithelial erosions; DES dry eye syndrome; MGD meibomian gland dysfunction; ATs artificial tears; PFAT's preservative free artificial tears; NSC nuclear sclerotic cataract; PSC posterior subcapsular cataract; ERM epi-retinal membrane; PVD posterior vitreous detachment; RD retinal detachment; DM diabetes mellitus; DR diabetic retinopathy; NPDR non-proliferative diabetic retinopathy; PDR proliferative diabetic retinopathy; CSME clinically significant macular edema; DME diabetic macular edema; dbh dot blot hemorrhages; CWS cotton wool spot; POAG primary open angle glaucoma; C/D cup-to-disc ratio; HVF humphrey visual field; GVF goldmann visual field; OCT optical coherence tomography; IOP intraocular pressure; BRVO Branch retinal vein occlusion; CRVO central retinal vein occlusion; CRAO  central retinal artery occlusion; BRAO branch retinal artery occlusion; RT retinal tear; SB scleral buckle; PPV pars plana vitrectomy; VH Vitreous hemorrhage; PRP panretinal laser photocoagulation; IVK intravitreal kenalog; VMT vitreomacular traction; MH Macular hole;  NVD neovascularization of the disc; NVE neovascularization elsewhere; AREDS age related eye disease study; ARMD age related macular degeneration; POAG primary open angle glaucoma; EBMD epithelial/anterior basement membrane dystrophy; ACIOL anterior chamber intraocular lens; IOL intraocular lens; PCIOL posterior chamber intraocular lens; Phaco/IOL phacoemulsification with intraocular lens placement; PRK photorefractive keratectomy; LASIK laser assisted in situ keratomileusis; HTN hypertension; DM diabetes mellitus; COPD chronic obstructive pulmonary disease

## 2017-07-30 DIAGNOSIS — E0800 Diabetes mellitus due to underlying condition with hyperosmolarity without nonketotic hyperglycemic-hyperosmolar coma (NKHHC): Secondary | ICD-10-CM | POA: Diagnosis not present

## 2017-07-30 DIAGNOSIS — I10 Essential (primary) hypertension: Secondary | ICD-10-CM | POA: Diagnosis not present

## 2017-08-05 NOTE — Progress Notes (Signed)
[facility] - Clinic Note  08/06/2017     CHIEF COMPLAINT Patient presents for Eye Exam   HISTORY OF PRESENT ILLNESS: Maria Foster is a 61 y.o. female who presents to the clinic today for:   HPI    Eye Exam  In both eyes.  I, the attending physician,  performed the HPI with the patient and updated documentation appropriately.        Comments  Pt presents for PRP OS; Pt wishes to proceed; Pt states that OD is healing well and tolerated procedure well, pt states that OD VA is stable, reports that she finished gtts as directed; Pt reports last CBG 150's; Pt states that CBG is normal; Pt denies floaters, denies flashes, denies wavy VA; Pt denies gtts usage, denies taking eye vits;      Last edited by Rennis Chris, MD on 08/06/2017  8:35 AM. (History)      Referring physician: No referring provider defined for this encounter.  HISTORICAL INFORMATION:   Selected notes from the MEDICAL RECORD NUMBER Referral from Dr. Sallye Lat for PDR OU w/ [facility] OU. - pt with history of diet controlled DMII, HTN, RA - initially presented to Dr. Dione Booze on 8.28.18 for recent onset floater OS.   CURRENT MEDICATIONS: Current Outpatient Prescriptions (Ophthalmic Drugs)  Medication Sig  . prednisoLONE acetate (PRED FORTE) 1 % ophthalmic suspension Place 1 drop into the right eye 4 (four) times daily.   No current facility-administered medications for this visit.  (Ophthalmic Drugs)   Current Outpatient Prescriptions (Other)  Medication Sig  . amLODipine (NORVASC) 10 MG tablet Take 1 tablet (10 mg total) by mouth daily. (Patient not taking: Reported on 07/23/2017)  . aspirin EC 81 MG tablet Take 81 mg by mouth every Monday, Wednesday, and Friday.  . diphenhydrAMINE (BENADRYL) 25 MG tablet Take 0.5 tablets (12.5 mg total) by mouth every 6 (six) hours as needed (take with Reglan). (Patient not taking: Reported on 07/23/2017)  . metoCLOPramide (REGLAN) 10 MG tablet Take 0.5  tablets (5 mg total) by mouth every 6 (six) hours as needed for nausea. (Patient not taking: Reported on 07/23/2017)  . naproxen sodium (ANAPROX) 220 MG tablet Take 220 mg by mouth daily as needed (pain).   No current facility-administered medications for this visit.  (Other)      REVIEW OF SYSTEMS: ROS    Positive for: Endocrine, Eyes   Negative for: Constitutional, Gastrointestinal, Neurological, Skin, Genitourinary, Musculoskeletal, HENT, Cardiovascular, Respiratory, Psychiatric, Allergic/Imm, Heme/Lymph   Last edited by Murtis Sink on 08/06/2017  8:14 AM. (History)       ALLERGIES No Known Allergies  PAST MEDICAL HISTORY Past Medical History:  Diagnosis Date  . Arthritis   . Cataract   . Diabetes mellitus without complication (HCC)   . Hypertension    History reviewed. No pertinent surgical history.  FAMILY HISTORY Family History  Problem Relation Age of Onset  . Amblyopia Neg Hx   . Blindness Neg Hx   . Cataracts Neg Hx   . Glaucoma Neg Hx   . Macular degeneration Neg Hx   . Retinal detachment Neg Hx   . Strabismus Neg Hx   . Retinitis pigmentosa Neg Hx     SOCIAL HISTORY Social History  Substance Use Topics  . Smoking status: Current Every Day Smoker    Packs/day: 0.50  . Smokeless tobacco: Never Used  . Alcohol use Yes     Comment: occ  OPHTHALMIC EXAM:  Base Eye Exam    Visual Acuity (Snellen - Linear)      Right Left   Dist [place] 20/25 20/25 -2   Dist ph [place] 20/20 20/20       Tonometry (Tonopen, 8:19 AM)      Right Left   Pressure 15 16       Pupils      Dark Light Shape React APD   Right 3 2 Round Sluggish None   Left 3 2 Round Sluggish None       Visual Fields      Left Right    Full Full       Extraocular Movement      Right Left    Full, Ortho Full, Ortho       Neuro/Psych    Oriented x3:  Yes   Mood/Affect:  Normal       Dilation    Both eyes:  1.0% Mydriacyl, 2.5% Phenylephrine @ 8:19 AM        Slit  Lamp and Fundus Exam    External Exam      Right Left   External Normal Normal       Slit Lamp Exam      Right Left   Lids/Lashes Normal Normal   Conjunctiva/Sclera Temporal and Nasal Pinguecula; racial melanosis Temporal and Nasal Pinguecula; racial melanosis   Cornea Clear Clear   Anterior Chamber Deep and quiet Deep and quiet   Iris dilated to 29mm; no NVI dilated to 6.55mm; no NVI   Lens 2+ Nuclear sclerosis, 3+ Cortical cataract 2+ Nuclear sclerosis, 3+ Cortical cataract   Vitreous Vitreous syneresis Vitreous syneresis; small clumps of VH inferiorlly        Fundus Exam      Right Left   Disc fine NVD sharp; fine NVD   C/D Ratio 0.6 0.5   Macula DBH; early NVE temporally cotton wool spots sup; DBH; temporal NVE   Vessels NVE along inferior arcade, superonasal to disc  NVE at 1030   Periphery DBH; attached; nasal laser scars DBH; attached          IMAGING AND PROCEDURES  Imaging and Procedures for 08/06/17  Panretinal Photocoagulation - OS - Left Eye     LASER PROCEDURE NOTE  Diagnosis:   Proliferative Diabetic Retinopathy, LEFT EYE  Procedure:  Pan-retinal photocoagulation using slit lamp laser, LEFT EYE  Anesthesia:  Topical tetracaine and subconj lidocaine  Surgeon: Rennis Chris, MD, PhD   Informed consent obtained, operative eye marked, and time out performed prior to initiation of laser.   Lumenis [id number] slit lamp laser Pattern: 2x2 square, 3x3 square Power: 340-460 mW Duration: 30 msec  Spot size: 200 microns  # spots: 1061 spots  Complications: None.  Notes: significant cortical cataract obscuring view and preventing laser uptake peripherally  RTC: 2 wks for PRP fill in, right eye                ASSESSMENT/PLAN:    ICD-10-CM   1. 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  Vitreous hemorrhage of left eye (HCC) H43.12 Panretinal Photocoagulation - OS - Left Eye  3. 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  Proliferative diabetic retinopathy, both eyes The incidence, risk factors for progression, natural history and  treatment options for diabetic retinopathy  were discussed with patient.  The need for close monitoring of blood glucose, blood pressure, and serum lipids, avoiding cigarette or any type of tobacco, and the need for long term follow up was also discussed with patient. - significant NVD and NVE OU on exam -- confirmed by fluorescein angiography - significant peripheral nonperfusion / capillary drop out - No CSME, both eyes  - recommend PRP OU -- OD on 9.4.18 - today, laser OD looks good but needs fill in - here today for PRP OS (9.18.18) - see procedure note above - start PF QID OS x7 days - f/u in 2 wks for fill in PRP OD (will use subconj lido block again)  2. Vitreous hemorrhage OS secondary to #1 above - causing pts symptoms - discussed findings and prognosis - VH precautions reviewed -- minimize activities, keep head elevated, avoid ASA/NSAIDs/blood thinners as able  3. NSC, CC OU - being monitored by Dr. Sallye Lat - The symptoms of cataract, surgical options, and treatments and risks were discussed with patient. - discussed diagnosis and progression - not yet visually significant - continue to monitor for now   Ophthalmic Meds Ordered this visit:  No orders of the defined types were placed in this encounter.      Return in about 2 weeks (around 08/20/2017) for Laser PRP fill in OD.  Patient Instructions  Use Prednisolone acetate (Pred-Forte) -- 1 drop, 4x/day in LEFT EYE    Explained the diagnoses, plan, and follow up with the patient and they expressed understanding.  Patient expressed understanding of the importance of proper follow up care.   Karie Chimera, M.D., Ph.D. Vitreoretinal Surgeon [facility] 08/06/17     Abbreviations: M myopia (nearsighted); A astigmatism; H hyperopia (farsighted); P presbyopia; Mrx spectacle prescription;  CTL contact lenses; OD right eye; OS left eye; OU both eyes  XT exotropia; ET esotropia; PEK punctate epithelial keratitis; PEE punctate epithelial erosions; DES dry eye syndrome; MGD meibomian gland dysfunction; ATs artificial tears; PFAT's preservative free artificial tears; NSC nuclear sclerotic cataract; PSC posterior subcapsular cataract; ERM epi-retinal membrane; PVD posterior vitreous detachment; RD retinal detachment; DM diabetes mellitus; DR diabetic retinopathy; NPDR non-proliferative diabetic retinopathy; PDR proliferative diabetic retinopathy; CSME clinically significant macular edema; DME diabetic macular edema; dbh dot blot hemorrhages; CWS cotton wool spot; POAG primary open angle glaucoma; C/D cup-to-disc ratio; HVF humphrey visual field; GVF goldmann visual field; OCT optical coherence tomography; IOP intraocular pressure; BRVO Branch retinal vein occlusion; CRVO central retinal vein occlusion; CRAO central retinal artery occlusion; BRAO branch retinal artery occlusion; RT retinal tear; SB scleral buckle; PPV pars plana vitrectomy; VH Vitreous hemorrhage; PRP panretinal laser photocoagulation; IVK intravitreal kenalog; VMT vitreomacular traction; MH Macular hole;  NVD neovascularization of the disc; NVE neovascularization elsewhere; AREDS age related eye disease study; ARMD age related macular degeneration; POAG primary open angle glaucoma; EBMD epithelial/anterior basement membrane dystrophy; ACIOL anterior chamber intraocular lens; IOL intraocular lens; PCIOL posterior chamber intraocular lens; Phaco/IOL phacoemulsification with intraocular lens placement; PRK photorefractive keratectomy; LASIK laser assisted in situ keratomileusis; HTN hypertension; DM diabetes mellitus; COPD chronic obstructive pulmonary disease

## 2017-08-06 ENCOUNTER — Ambulatory Visit (INDEPENDENT_AMBULATORY_CARE_PROVIDER_SITE_OTHER): Payer: Medicare Other | Admitting: Ophthalmology

## 2017-08-06 ENCOUNTER — Encounter (INDEPENDENT_AMBULATORY_CARE_PROVIDER_SITE_OTHER): Payer: Self-pay | Admitting: Ophthalmology

## 2017-08-06 ENCOUNTER — Encounter (INDEPENDENT_AMBULATORY_CARE_PROVIDER_SITE_OTHER): Payer: Medicare Other | Admitting: Ophthalmology

## 2017-08-06 DIAGNOSIS — E113593 Type 2 diabetes mellitus with proliferative diabetic retinopathy without macular edema, bilateral: Secondary | ICD-10-CM

## 2017-08-06 DIAGNOSIS — H25813 Combined forms of age-related cataract, bilateral: Secondary | ICD-10-CM | POA: Diagnosis not present

## 2017-08-06 DIAGNOSIS — H4312 Vitreous hemorrhage, left eye: Secondary | ICD-10-CM | POA: Diagnosis not present

## 2017-08-06 NOTE — Patient Instructions (Signed)
Use Prednisolone acetate (Pred-Forte) -- 1 drop, 4x/day in LEFT EYE

## 2017-08-19 NOTE — Progress Notes (Signed)
[facility] - Clinic Note  08/20/2017     CHIEF COMPLAINT Patient presents for Retina Follow Up   HISTORY OF PRESENT ILLNESS: Maria Foster is a 61 y.o. female who presents to the clinic today for:   HPI    Retina Follow Up  Patient presents with  Diabetic Retinopathy.  In both eyes.  This started years ago.  Severity is moderate.  Since onset it is gradually improving.  I, the attending physician,  performed the HPI with the patient and updated documentation appropriately.        Comments  F/U PDR OU; S/P PRP OU (OD 07/23/2017 and OS 08/06/2017);  Pt states she is able to see slightly better; Pt states OS is slightly "tender"; Pt denies floaters, denies flashes, denies wavy VA; Pt reports CBG - unknown (this AM);     Last edited by Rennis Chris, MD on 08/20/2017  2:29 PM. (History)      Referring physician: Sallye Lat, MD [address] [place], [place] [address]  HISTORICAL INFORMATION:   Selected notes from the MEDICAL RECORD NUMBER Referral from Dr. Sallye Lat for PDR OU w/ [facility] OU. - pt with history of diet controlled DMII, HTN, RA - initially presented to Dr. Dione Booze on 8.28.18 for recent onset floater OS.   CURRENT MEDICATIONS: Current Outpatient Prescriptions (Ophthalmic Drugs)  Medication Sig  . prednisoLONE acetate (PRED FORTE) 1 % ophthalmic suspension Place 1 drop into the right eye 4 (four) times daily.   No current facility-administered medications for this visit.  (Ophthalmic Drugs)   Current Outpatient Prescriptions (Other)  Medication Sig  . amLODipine (NORVASC) 10 MG tablet Take 1 tablet (10 mg total) by mouth daily. (Patient not taking: Reported on 07/23/2017)  . aspirin EC 81 MG tablet Take 81 mg by mouth every Monday, Wednesday, and Friday.  . diphenhydrAMINE (BENADRYL) 25 MG tablet Take 0.5 tablets (12.5 mg total) by mouth every 6 (six) hours as needed (take with Reglan). (Patient not taking: Reported on  07/23/2017)  . metoCLOPramide (REGLAN) 10 MG tablet Take 0.5 tablets (5 mg total) by mouth every 6 (six) hours as needed for nausea. (Patient not taking: Reported on 07/23/2017)  . naproxen sodium (ANAPROX) 220 MG tablet Take 220 mg by mouth daily as needed (pain).   No current facility-administered medications for this visit.  (Other)      REVIEW OF SYSTEMS: ROS    Positive for: Endocrine, Eyes   Negative for: Constitutional, Gastrointestinal, Neurological, Skin, Genitourinary, Musculoskeletal, HENT, Cardiovascular, Respiratory, Psychiatric, Allergic/Imm, Heme/Lymph   Last edited by Murtis Sink on 08/20/2017  1:13 PM. (History)       ALLERGIES No Known Allergies  PAST MEDICAL HISTORY Past Medical History:  Diagnosis Date  . Arthritis   . Cataract   . Diabetes mellitus without complication (HCC)   . Hypertension    Past Surgical History:  Procedure Laterality Date  . PRP Bilateral OD - 07/23/2017 OS - 08/06/2017    FAMILY HISTORY Family History  Problem Relation Age of Onset  . Amblyopia Neg Hx   . Blindness Neg Hx   . Cataracts Neg Hx   . Glaucoma Neg Hx   . Macular degeneration Neg Hx   . Retinal detachment Neg Hx   . Strabismus Neg Hx   . Retinitis pigmentosa Neg Hx     SOCIAL HISTORY Social History  Substance Use Topics  . Smoking status: Current Every Day Smoker    Packs/day:  0.50  . Smokeless tobacco: Never Used  . Alcohol use Yes     Comment: occ         OPHTHALMIC EXAM:  Base Eye Exam    Visual Acuity (Snellen - Linear)      Right Left   Dist [place] 20/25 20/25 -1   Dist ph [place] 20/20 20/20       Tonometry (Applanation, 1:16 PM)      Right Left   Pressure 18 18       Visual Fields (Counting fingers)      Left Right    Full Full       Extraocular Movement      Right Left    Full, Ortho Full, Ortho       Neuro/Psych    Oriented x3:  Yes   Mood/Affect:  Normal       Dilation    Both eyes:  1.0% Mydriacyl, 2.5% Phenylephrine @  1:16 PM        Slit Lamp and Fundus Exam    External Exam      Right Left   External Normal Normal       Slit Lamp Exam      Right Left   Lids/Lashes Normal Normal   Conjunctiva/Sclera Temporal and Nasal Pinguecula; racial melanosis Temporal and Nasal Pinguecula; racial melanosis   Cornea Clear Clear   Anterior Chamber Deep and quiet Deep and quiet   Iris dilated to 62mm; no NVI dilated to 6.26mm; no NVI   Lens 2+ Nuclear sclerosis, 3+ Cortical cataract 2+ Nuclear sclerosis, 3+ Cortical cataract   Vitreous Vitreous syneresis Vitreous syneresis; small clumps of VH inferiorlly; VH settling inferiorly        Fundus Exam      Right Left   Disc fine NVD regressing sharp; fine NVD regressing   C/D Ratio 0.6 0.5   Macula DBH; early NVE temporally; punctate heme just nasal to fovea;  cotton wool spots sup; DBH; temporal NVE   Vessels NVE along inferior arcade, superonasal to disc  NVE at 1030   Periphery DBH; attached; scattered dot and blot hemorrhages 360; scattered PRP 360 w/ room for fill-in; pre-retinal heme above disc;  DBH; attached; scattered dot and blot hemorrhages 360; PRP laser, room for fill in temporally and inferiorly;           IMAGING AND PROCEDURES  Imaging and Procedures for 08/20/17  OCT, Retina - OU - Both Eyes     Right Eye Quality was good. Central Foveal Thickness: 262. Progression has been stable. Findings include normal foveal contour, no IRF, no SRF (Mild inf edema).   Left Eye Quality was good. Central Foveal Thickness: 273. Progression has been stable. Findings include normal foveal contour, no IRF, no SRF.   Notes Images taken, stored on drive   Diagnosis / Impression:  No DME OU  Clinical management:  See below  Abbreviations: NFP - Normal foveal profile. CME - cystoid macular edema. PED - pigment epithelial detachment. IRF - intraretinal fluid. SRF - subretinal fluid. EZ - ellipsoid zone. ERM - epiretinal membrane. ORA - outer retinal  atrophy. ORT - outer retinal tubulation. SRHM - subretinal hyper-reflective material       Panretinal Photocoagulation - OD - Right Eye     LASER PROCEDURE NOTE  Diagnosis:   Proliferative Diabetic Retinopathy, RIGHT EYE  Procedure:  Pan-retinal photocoagulation using slit lamp laser, RIGHT EYE, fill-in  Anesthesia:  Topical, subconjunctival block  Surgeon: Rennis Chris,  MD, PhD   Informed consent obtained, operative eye marked, and time out performed prior to initiation of laser.   Lumenis [id number] slit lamp laser Pattern: 3x3 square Power: 260-300 mW Duration: 30 msec  Spot size: 200 microns  # spots: 792 spots fill-in  Complications: None.  Notes: significant cortical cataract obscuring view and preventing laser up take in far periphery  RTC: 4 wks                ASSESSMENT/PLAN:    ICD-10-CM   1. 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 Vitreous hemorrhage of left eye (HCC) H43.12   3. 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  Proliferative diabetic retinopathy, both eyes The incidence, risk factors for progression, natural history and treatment options for diabetic retinopathy  were discussed with patient.  The need for close monitoring of blood glucose, blood pressure, and serum lipids, avoiding cigarette or any type of tobacco, and the need for long term follow up was also discussed with patient. - significant NVD and NVE OU on exam -- confirmed by fluorescein angiography - significant peripheral nonperfusion / capillary drop out - No CSME, both eyes  - s/p PRP OD on 9.4.18, PRP OS 9.18.18 - today, laser looks good OU but needs fill in - here today for fill in PRP OD (10.2.18) - see procedure note above - start PF QID OD x7 days - f/u in 4 wks for DFE, possible fill in  PRP OS with LIO (will use subconj lido block again)  - after this next visit will send back to [facility] for cataract eval  2. Vitreous hemorrhage OS secondary to #1 above - causing pts symptoms - discussed findings and prognosis - VH precautions reviewed -- minimize activities, keep head elevated, avoid ASA/NSAIDs/blood thinners as able  3. NSC, CC OU - being monitored by Dr. Sallye Lat - The symptoms of cataract, surgical options, and treatments and risks were discussed with patient. - discussed diagnosis and progression - approaching visual significance and impeding PRP laser to periphery in the treatment of PDR above - will send back to Dr. Dione Booze once we have as much PRP laser in place as possible   Ophthalmic Meds Ordered this visit:  No orders of the defined types were placed in this encounter.      Return in about 4 weeks (around 09/17/2017) for F/U PDR OU; PRP fill-in OS.  There are no Patient Instructions on file for this visit.   Explained the diagnoses, plan, and follow up with the patient and they expressed understanding.  Patient expressed understanding of the importance of proper follow up care.   Karie Chimera, M.D., Ph.D. Vitreoretinal Surgeon [facility] 08/20/17     Abbreviations: M myopia (nearsighted); A astigmatism; H hyperopia (farsighted); P presbyopia; Mrx spectacle prescription;  CTL contact lenses; OD right eye; OS left eye; OU both eyes  XT exotropia; ET esotropia; PEK punctate epithelial keratitis; PEE punctate epithelial erosions; DES dry eye syndrome; MGD meibomian gland dysfunction; ATs artificial tears; PFAT's preservative free artificial tears; NSC nuclear sclerotic cataract; PSC posterior subcapsular cataract; ERM epi-retinal membrane; PVD posterior vitreous detachment; RD retinal detachment; DM diabetes mellitus; DR diabetic retinopathy; NPDR non-proliferative diabetic retinopathy; PDR proliferative diabetic  retinopathy; CSME clinically significant macular edema;  DME diabetic macular edema; dbh dot blot hemorrhages; CWS cotton wool spot; POAG primary open angle glaucoma; C/D cup-to-disc ratio; HVF humphrey visual field; GVF goldmann visual field; OCT optical coherence tomography; IOP intraocular pressure; BRVO Branch retinal vein occlusion; CRVO central retinal vein occlusion; CRAO central retinal artery occlusion; BRAO branch retinal artery occlusion; RT retinal tear; SB scleral buckle; PPV pars plana vitrectomy; VH Vitreous hemorrhage; PRP panretinal laser photocoagulation; IVK intravitreal kenalog; VMT vitreomacular traction; MH Macular hole;  NVD neovascularization of the disc; NVE neovascularization elsewhere; AREDS age related eye disease study; ARMD age related macular degeneration; POAG primary open angle glaucoma; EBMD epithelial/anterior basement membrane dystrophy; ACIOL anterior chamber intraocular lens; IOL intraocular lens; PCIOL posterior chamber intraocular lens; Phaco/IOL phacoemulsification with intraocular lens placement; [place] photorefractive keratectomy; LASIK laser assisted in situ keratomileusis; HTN hypertension; DM diabetes mellitus; COPD chronic obstructive pulmonary disease

## 2017-08-20 ENCOUNTER — Ambulatory Visit (INDEPENDENT_AMBULATORY_CARE_PROVIDER_SITE_OTHER): Payer: Medicare Other | Admitting: Ophthalmology

## 2017-08-20 ENCOUNTER — Encounter (INDEPENDENT_AMBULATORY_CARE_PROVIDER_SITE_OTHER): Payer: Self-pay | Admitting: Ophthalmology

## 2017-08-20 DIAGNOSIS — H25813 Combined forms of age-related cataract, bilateral: Secondary | ICD-10-CM

## 2017-08-20 DIAGNOSIS — H4312 Vitreous hemorrhage, left eye: Secondary | ICD-10-CM

## 2017-08-20 DIAGNOSIS — E113593 Type 2 diabetes mellitus with proliferative diabetic retinopathy without macular edema, bilateral: Secondary | ICD-10-CM | POA: Diagnosis not present

## 2017-09-03 ENCOUNTER — Encounter (INDEPENDENT_AMBULATORY_CARE_PROVIDER_SITE_OTHER): Payer: Medicare Other | Admitting: Ophthalmology

## 2017-09-19 NOTE — Progress Notes (Deleted)
[facility] - Clinic Note  09/23/2017     CHIEF COMPLAINT Patient presents for No chief complaint on file.   HISTORY OF PRESENT ILLNESS: Maria Foster is a 61 y.o. female who presents to the clinic today for:     Referring physician: No referring provider defined for this encounter.  HISTORICAL INFORMATION:   Selected notes from the MEDICAL RECORD NUMBER Referral from Dr. Sallye Lat for PDR OU w/ [facility] OU. - pt with history of diet controlled DMII, HTN, RA - initially presented to Dr. Dione Booze on 8.28.18 for recent onset floater OS.   CURRENT MEDICATIONS: Current Outpatient Prescriptions (Ophthalmic Drugs)  Medication Sig   prednisoLONE acetate (PRED FORTE) 1 % ophthalmic suspension Place 1 drop into the right eye 4 (four) times daily.   No current facility-administered medications for this visit.  (Ophthalmic Drugs)   Current Outpatient Prescriptions (Other)  Medication Sig   amLODipine (NORVASC) 10 MG tablet Take 1 tablet (10 mg total) by mouth daily. (Patient not taking: Reported on 07/23/2017)   aspirin EC 81 MG tablet Take 81 mg by mouth every Monday, Wednesday, and Friday.   diphenhydrAMINE (BENADRYL) 25 MG tablet Take 0.5 tablets (12.5 mg total) by mouth every 6 (six) hours as needed (take with Reglan). (Patient not taking: Reported on 07/23/2017)   metoCLOPramide (REGLAN) 10 MG tablet Take 0.5 tablets (5 mg total) by mouth every 6 (six) hours as needed for nausea. (Patient not taking: Reported on 07/23/2017)   naproxen sodium (ANAPROX) 220 MG tablet Take 220 mg by mouth daily as needed (pain).   No current facility-administered medications for this visit.  (Other)      REVIEW OF SYSTEMS:    ALLERGIES No Known Allergies  PAST MEDICAL HISTORY Past Medical History:  Diagnosis Date   Arthritis    Cataract    Diabetes mellitus without complication (HCC)    Hypertension    Past Surgical History:  Procedure Laterality Date    PRP Bilateral OD - 07/23/2017 OS - 08/06/2017    FAMILY HISTORY Family History  Problem Relation Age of Onset   Amblyopia Neg Hx    Blindness Neg Hx    Cataracts Neg Hx    Glaucoma Neg Hx    Macular degeneration Neg Hx    Retinal detachment Neg Hx    Strabismus Neg Hx    Retinitis pigmentosa Neg Hx     SOCIAL HISTORY Social History  Substance Use Topics   Smoking status: Current Every Day Smoker    Packs/day: 0.50   Smokeless tobacco: Never Used   Alcohol use Yes     Comment: occ         OPHTHALMIC EXAM:   Not recorded      IMAGING AND PROCEDURES  Imaging and Procedures for 09/19/17           ASSESSMENT/PLAN:    ICD-10-CM   1. 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 Vitreous hemorrhage of left eye (HCC) H43.12   3. 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  Proliferative diabetic retinopathy, both eyes The incidence, risk factors for progression, natural history and treatment options for diabetic retinopathy  were discussed with patient.  The need for close monitoring of blood glucose, blood pressure, and serum lipids, avoiding cigarette or any type of tobacco, and the need for long  term follow up was also discussed with patient. - significant NVD and NVE OU on exam -- confirmed by fluorescein angiography - significant peripheral nonperfusion / capillary drop out - No CSME, both eyes  - s/p PRP OD on 9.4.18, fill-in PRP OD 10.2.18, PRP OS 9.18.18 - today, laser looks good OU but needs fill in - here today for fill in PRP OS (11.5.18) - see procedure note above - start PF QID OD x7 days -  - f/u in 4 wks for DFE, possible fill in PRP OS with LIO (will use subconj lido block again)  - after this next visit will send back to [facility] for cataract eval  2. Vitreous hemorrhage OS secondary to #1 above - causing pts  symptoms - discussed findings and prognosis - VH precautions reviewed -- minimize activities, keep head elevated, avoid ASA/NSAIDs/blood thinners as able  3. NSC, CC OU - being monitored by Dr. Sallye Lat - The symptoms of cataract, surgical options, and treatments and risks were discussed with patient. - discussed diagnosis and progression - approaching visual significance and impeding PRP laser to periphery in the treatment of PDR above - will send back to Dr. Dione Booze once we have as much PRP laser in place as possible   Ophthalmic Meds Ordered this visit:  No orders of the defined types were placed in this encounter.      No Follow-up on file.  There are no Patient Instructions on file for this visit.   Explained the diagnoses, plan, and follow up with the patient and they expressed understanding.  Patient expressed understanding of the importance of proper follow up care.   Karie Chimera, M.D., Ph.D. Diseases & Surgery of the Retina and Vitreous [facility] 09/19/17       Abbreviations: M myopia (nearsighted); A astigmatism; H hyperopia (farsighted); P presbyopia; Mrx spectacle prescription;  CTL contact lenses; OD right eye; OS left eye; OU both eyes  XT exotropia; ET esotropia; PEK punctate epithelial keratitis; PEE punctate epithelial erosions; DES dry eye syndrome; MGD meibomian gland dysfunction; ATs artificial tears; PFAT's preservative free artificial tears; NSC nuclear sclerotic cataract; PSC posterior subcapsular cataract; ERM epi-retinal membrane; PVD posterior vitreous detachment; RD retinal detachment; DM diabetes mellitus; DR diabetic retinopathy; NPDR non-proliferative diabetic retinopathy; PDR proliferative diabetic retinopathy; CSME clinically significant macular edema; DME diabetic macular edema; dbh dot blot hemorrhages; CWS cotton wool spot; POAG primary open angle glaucoma; C/D cup-to-disc ratio; HVF humphrey visual field; GVF  goldmann visual field; OCT optical coherence tomography; IOP intraocular pressure; BRVO Branch retinal vein occlusion; CRVO central retinal vein occlusion; CRAO central retinal artery occlusion; BRAO branch retinal artery occlusion; RT retinal tear; SB scleral buckle; PPV pars plana vitrectomy; VH Vitreous hemorrhage; PRP panretinal laser photocoagulation; IVK intravitreal kenalog; VMT vitreomacular traction; MH Macular hole;  NVD neovascularization of the disc; NVE neovascularization elsewhere; AREDS age related eye disease study; ARMD age related macular degeneration; POAG primary open angle glaucoma; EBMD epithelial/anterior basement membrane dystrophy; ACIOL anterior chamber intraocular lens; IOL intraocular lens; PCIOL posterior chamber intraocular lens; Phaco/IOL phacoemulsification with intraocular lens placement; PRK photorefractive keratectomy; LASIK laser assisted in situ keratomileusis; HTN hypertension; DM diabetes mellitus; COPD chronic obstructive pulmonary disease

## 2017-09-23 ENCOUNTER — Encounter (INDEPENDENT_AMBULATORY_CARE_PROVIDER_SITE_OTHER): Payer: Medicare Other | Admitting: Ophthalmology

## 2018-01-31 IMAGING — CT CT ABD-PELV W/ CM
2 of 5 series · 11 of 46 positions shown, 12 images · IV contrast (Iodine)
Comparison: 10/17/2009

CLINICAL DATA: Diffuse abdominal pain with nausea vomiting.

EXAM:
CT ABDOMEN AND PELVIS WITH CONTRAST
TECHNIQUE: Multidetector CT imaging of the abdomen and pelvis was performed
using the standard protocol following bolus administration of
intravenous contrast.
CONTRAST:  100mL 1K7OJ1-MPP IOPAMIDOL (1K7OJ1-MPP) INJECTION 61%

[Series 201: routine, idose (2) · axial · 0.78mm/px · z∈[+94,+409]mm · 8 of 82 slices shown, 9 images]
[im 10/82  soft-tissue]
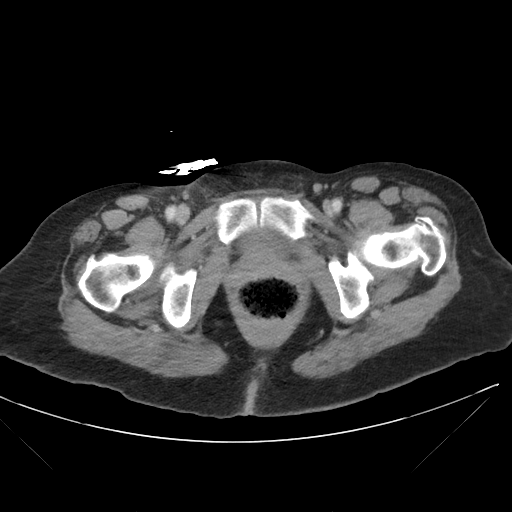
[im 10/82  bone]
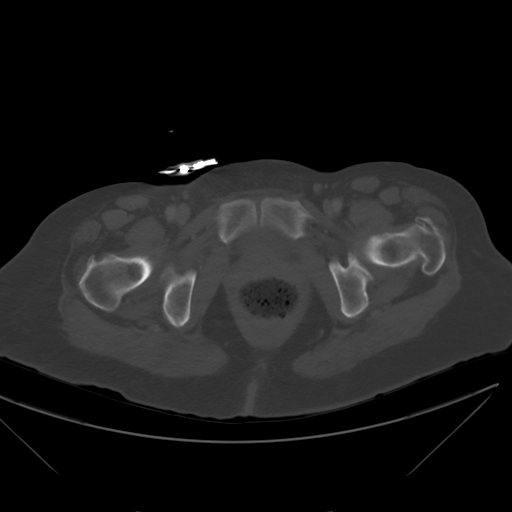
[im 19/82  soft-tissue]
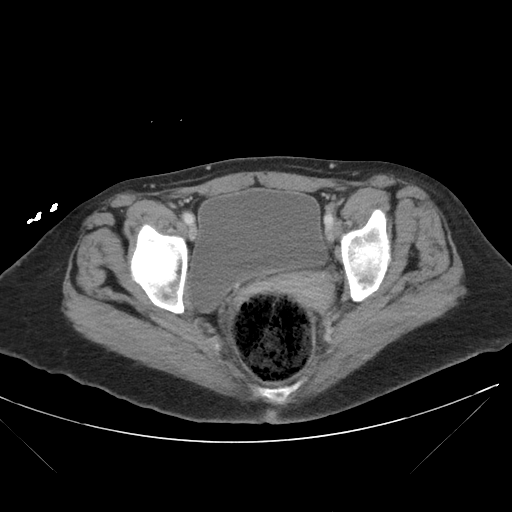
[im 28/82  soft-tissue]
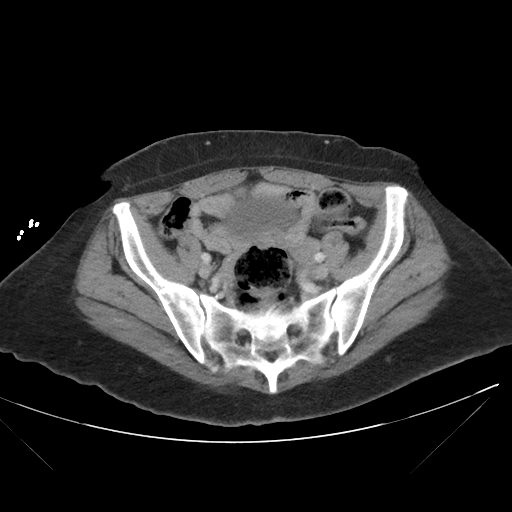
[im 37/82  soft-tissue]
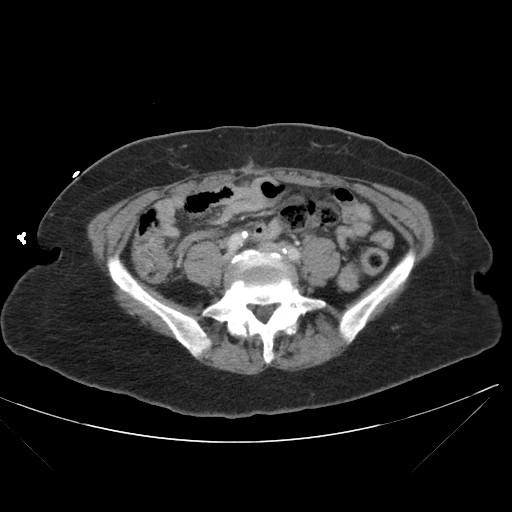
[im 46/82  soft-tissue]
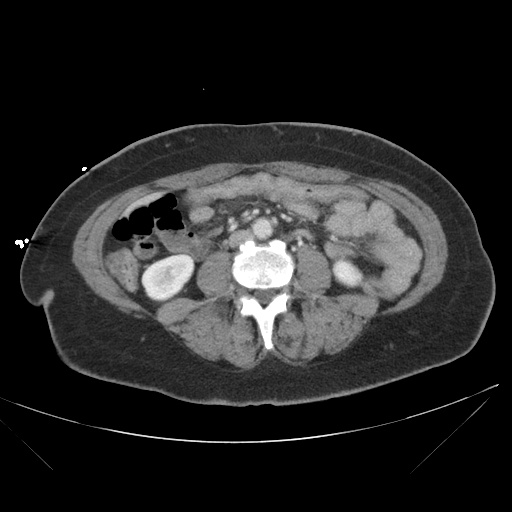
[im 55/82  soft-tissue]
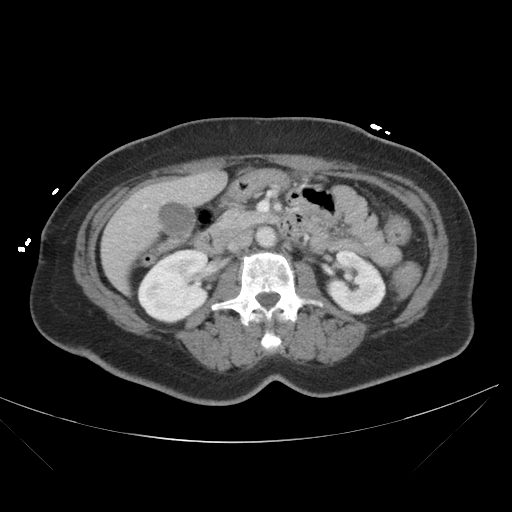
[im 64/82  soft-tissue]
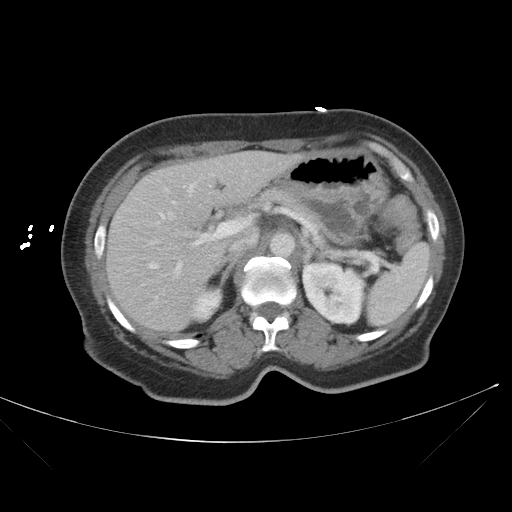
[im 73/82  soft-tissue]
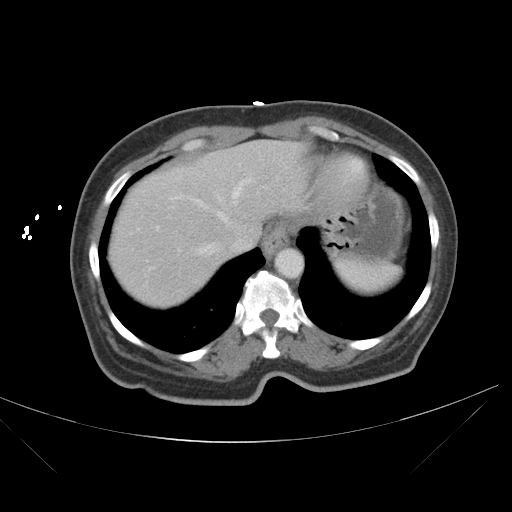

[Series 206: coronals, idose (2) · coronal · 0.45mm/px · 3 of 94 slices shown]
[im 32/94  soft-tissue]
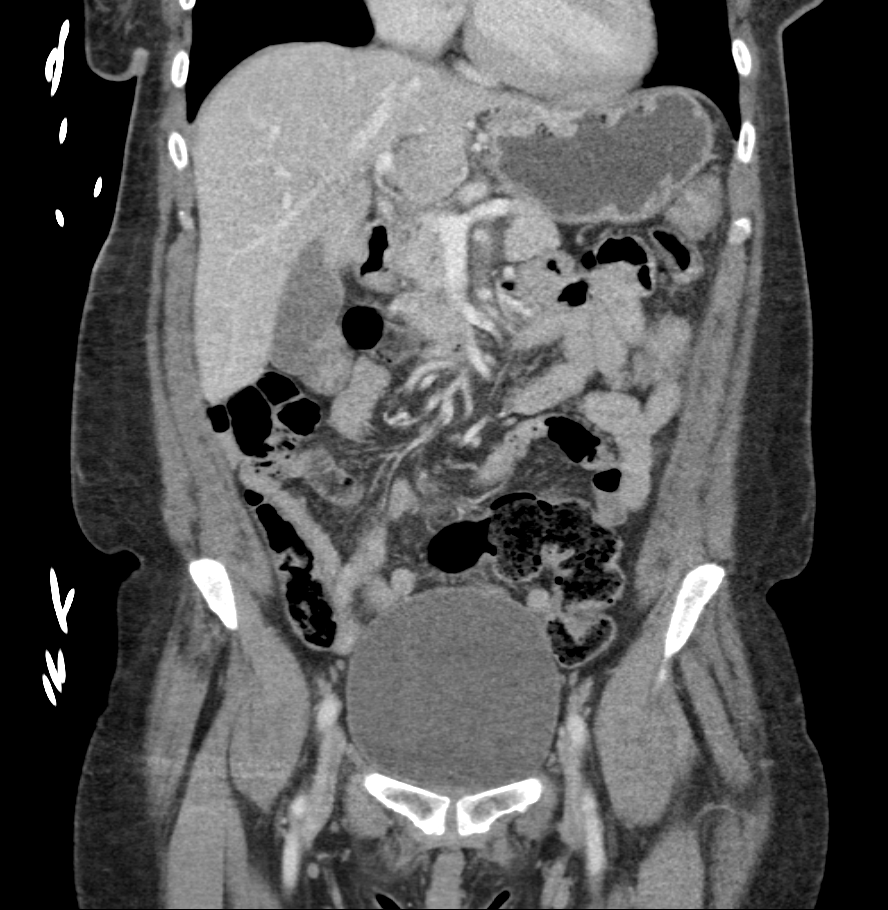
[im 42/94  soft-tissue]
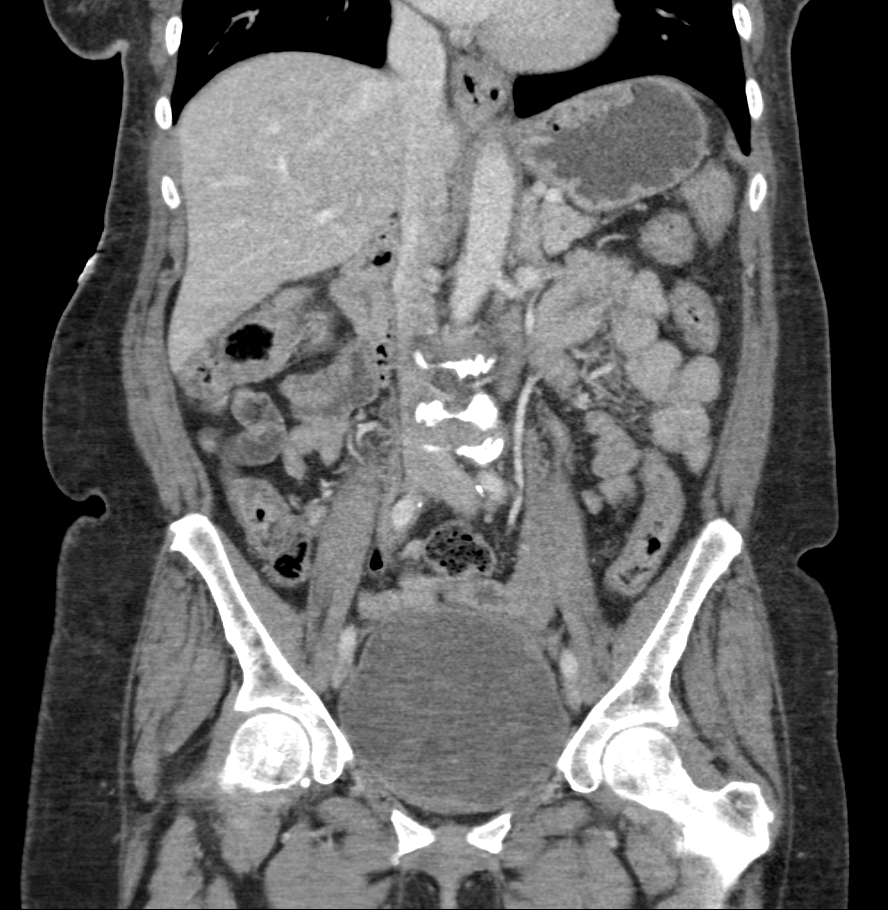
[im 52/94  soft-tissue]
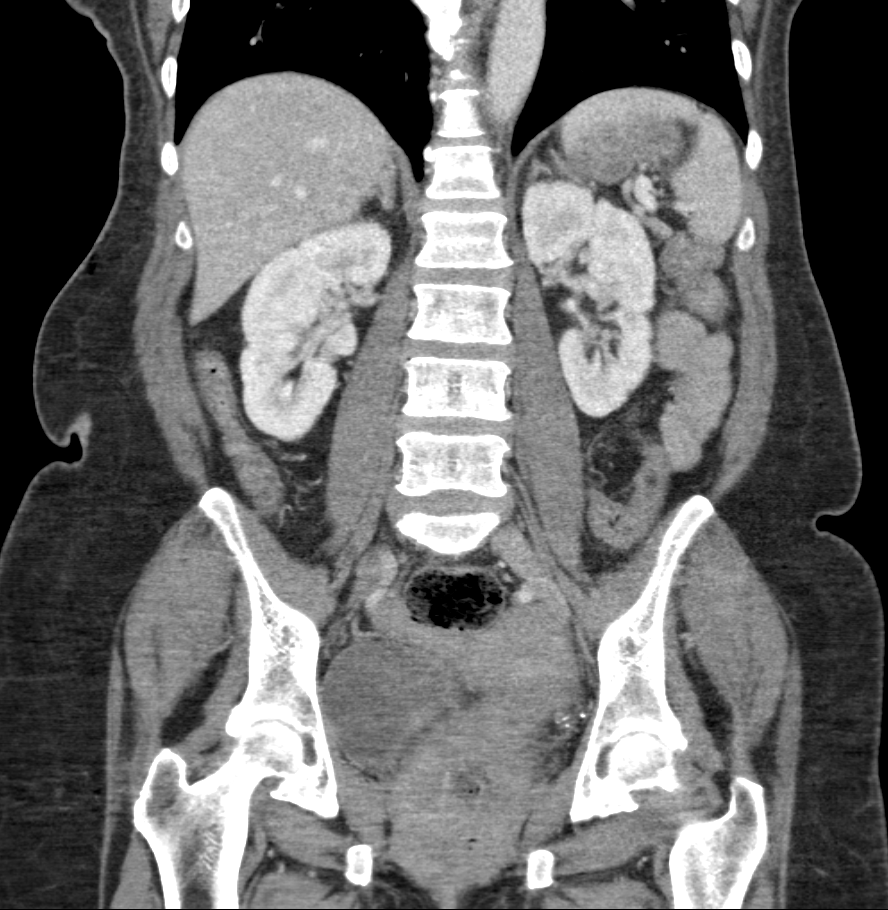

[11 of 46 positions shown; findings below may reference images not displayed]

FINDINGS: Lower chest:  Unremarkable.

Hepatobiliary: Small area of low attenuation in the anterior liver,
adjacent to the falciform ligament, is in a characteristic location
for focal fatty deposition and is unchanged in the interval. Liver
parenchyma otherwise unremarkable. There is no evidence for
gallstones, gallbladder wall thickening, or pericholecystic fluid.
No intrahepatic or extrahepatic biliary dilation.

Pancreas: No focal mass lesion. No dilatation of the main duct. No
intraparenchymal cyst. No peripancreatic edema.

Spleen: No splenomegaly. No focal mass lesion.

Adrenals/Urinary Tract: No adrenal nodule or mass. The kidneys are
unremarkable. No evidence for hydroureter. The urinary bladder
appears normal for the degree of distention.

Stomach/Bowel: Tiny hiatal hernia. Stomach unremarkable. Duodenum is
normally positioned as is the ligament of Treitz. No small bowel
wall thickening. No small bowel dilatation. The terminal ileum is
normal. The appendix is normal. No gross colonic mass. No colonic
wall thickening. No substantial diverticular change.

Vascular/Lymphatic: There is abdominal aortic atherosclerosis
without aneurysm. There is a cuff of soft tissue attenuation
encasing the proximal superior mesenteric artery with some apparent
thickening in the wall of the proximal SMA (well seen on axial image
22 of series 201 and coronal image 36 of series 206). Celiac axis
appears largely preserved than the IMA has normal imaging features.

Reproductive: The uterus has normal CT imaging appearance. There is
no adnexal mass.

Other: No intraperitoneal free fluid.

Musculoskeletal: Bone windows reveal no worrisome lytic or sclerotic
osseous lesions.
IMPRESSION: 1. Circumferential cuff of soft tissue attenuation with apparent
wall thickening in the proximal SMA. These imaging features can be
seen in cases of SMA vasculitis. This appearance can also be seen in
cases of pancreatic adenocarcinoma, presenting with only abnormal
soft tissue encasing the celiac axis or SMA and no identifiable
intra pancreatic lesion. Close follow-up is recommended
I discussed these results by telephone with Botsoe Afriye, PA ,

## 2020-11-05 ENCOUNTER — Ambulatory Visit: Payer: Medicare Other | Attending: Internal Medicine

## 2020-11-05 DIAGNOSIS — Z23 Encounter for immunization: Secondary | ICD-10-CM

## 2020-11-05 NOTE — Progress Notes (Signed)
   Covid-19 Vaccination Clinic  Name:  Maria Foster    MRN: [id number] DOB: [birth year]  11/05/2020  Maria Foster was observed post Covid-19 immunization for 15 minutes without incident. She was provided with Vaccine Information Sheet and instruction to access the V-Safe system.   Maria Foster was instructed to call 911 with any severe reactions post vaccine: Marland Kitchen Difficulty breathing  . Swelling of face and throat  . A fast heartbeat  . A bad rash all over body  . Dizziness and weakness   Immunizations Administered    Name Date Dose VIS Date Route   Pfizer COVID-19 Vaccine 11/05/2020 11:32 AM 0.3 mL 09/07/2020 Intramuscular   Manufacturer: [employer], [employer]   Lot: [id number]   NDC: [address]-2

## 2020-12-02 ENCOUNTER — Other Ambulatory Visit: Payer: Self-pay

## 2020-12-02 ENCOUNTER — Other Ambulatory Visit: Payer: Self-pay | Admitting: Family

## 2020-12-02 ENCOUNTER — Ambulatory Visit
Admission: RE | Admit: 2020-12-02 | Discharge: 2020-12-02 | Disposition: A | Discharge status: Home / Self Care | Payer: Medicare Other | Source: Ambulatory Visit | Attending: Family | Admitting: Family

## 2020-12-02 DIAGNOSIS — K5900 Constipation, unspecified: Secondary | ICD-10-CM

## 2020-12-18 ENCOUNTER — Inpatient Hospital Stay (HOSPITAL_BASED_OUTPATIENT_CLINIC_OR_DEPARTMENT_OTHER)
Admission: EM | Admit: 2020-12-18 | Discharge: 2020-12-23 | DRG: 291 | Disposition: A | Discharge status: Home / Self Care | Payer: Medicare Other | Attending: Family Medicine | Admitting: Family Medicine

## 2020-12-18 ENCOUNTER — Other Ambulatory Visit: Payer: Self-pay

## 2020-12-18 ENCOUNTER — Emergency Department (HOSPITAL_BASED_OUTPATIENT_CLINIC_OR_DEPARTMENT_OTHER): Payer: Medicare Other

## 2020-12-18 ENCOUNTER — Encounter (HOSPITAL_BASED_OUTPATIENT_CLINIC_OR_DEPARTMENT_OTHER): Payer: Self-pay | Admitting: Emergency Medicine

## 2020-12-18 DIAGNOSIS — F17210 Nicotine dependence, cigarettes, uncomplicated: Secondary | ICD-10-CM | POA: Diagnosis present

## 2020-12-18 DIAGNOSIS — N179 Acute kidney failure, unspecified: Secondary | ICD-10-CM | POA: Diagnosis not present

## 2020-12-18 DIAGNOSIS — E11319 Type 2 diabetes mellitus with unspecified diabetic retinopathy without macular edema: Secondary | ICD-10-CM | POA: Diagnosis present

## 2020-12-18 DIAGNOSIS — I130 Hypertensive heart and chronic kidney disease with heart failure and stage 1 through stage 4 chronic kidney disease, or unspecified chronic kidney disease: Secondary | ICD-10-CM | POA: Diagnosis not present

## 2020-12-18 DIAGNOSIS — I10 Essential (primary) hypertension: Secondary | ICD-10-CM | POA: Diagnosis present

## 2020-12-18 DIAGNOSIS — I088 Other rheumatic multiple valve diseases: Secondary | ICD-10-CM | POA: Diagnosis present

## 2020-12-18 DIAGNOSIS — E119 Type 2 diabetes mellitus without complications: Secondary | ICD-10-CM

## 2020-12-18 DIAGNOSIS — M1990 Unspecified osteoarthritis, unspecified site: Secondary | ICD-10-CM | POA: Diagnosis present

## 2020-12-18 DIAGNOSIS — K5900 Constipation, unspecified: Secondary | ICD-10-CM | POA: Diagnosis present

## 2020-12-18 DIAGNOSIS — Z8249 Family history of ischemic heart disease and other diseases of the circulatory system: Secondary | ICD-10-CM | POA: Diagnosis not present

## 2020-12-18 DIAGNOSIS — Z7982 Long term (current) use of aspirin: Secondary | ICD-10-CM | POA: Diagnosis not present

## 2020-12-18 DIAGNOSIS — I5043 Acute on chronic combined systolic (congestive) and diastolic (congestive) heart failure: Secondary | ICD-10-CM | POA: Diagnosis present

## 2020-12-18 DIAGNOSIS — K5909 Other constipation: Secondary | ICD-10-CM | POA: Diagnosis not present

## 2020-12-18 DIAGNOSIS — I272 Pulmonary hypertension, unspecified: Secondary | ICD-10-CM | POA: Diagnosis present

## 2020-12-18 DIAGNOSIS — E1122 Type 2 diabetes mellitus with diabetic chronic kidney disease: Secondary | ICD-10-CM | POA: Diagnosis not present

## 2020-12-18 DIAGNOSIS — H4311 Vitreous hemorrhage, right eye: Secondary | ICD-10-CM | POA: Diagnosis present

## 2020-12-18 DIAGNOSIS — I248 Other forms of acute ischemic heart disease: Secondary | ICD-10-CM | POA: Diagnosis not present

## 2020-12-18 DIAGNOSIS — Z79899 Other long term (current) drug therapy: Secondary | ICD-10-CM | POA: Diagnosis not present

## 2020-12-18 DIAGNOSIS — Z20822 Contact with and (suspected) exposure to covid-19: Secondary | ICD-10-CM | POA: Diagnosis not present

## 2020-12-18 DIAGNOSIS — I5041 Acute combined systolic (congestive) and diastolic (congestive) heart failure: Secondary | ICD-10-CM

## 2020-12-18 DIAGNOSIS — R0602 Shortness of breath: Secondary | ICD-10-CM | POA: Diagnosis present

## 2020-12-18 DIAGNOSIS — N189 Chronic kidney disease, unspecified: Secondary | ICD-10-CM | POA: Diagnosis present

## 2020-12-18 DIAGNOSIS — I161 Hypertensive emergency: Secondary | ICD-10-CM

## 2020-12-18 DIAGNOSIS — I509 Heart failure, unspecified: Secondary | ICD-10-CM

## 2020-12-18 DIAGNOSIS — I5021 Acute systolic (congestive) heart failure: Secondary | ICD-10-CM

## 2020-12-18 DIAGNOSIS — R778 Other specified abnormalities of plasma proteins: Secondary | ICD-10-CM

## 2020-12-18 DIAGNOSIS — R7989 Other specified abnormal findings of blood chemistry: Secondary | ICD-10-CM

## 2020-12-18 NOTE — ED Triage Notes (Signed)
Reports generalized weakness onset this week, reports that she's been taking a lot of medications for constipation. Last bowel movement today.

## 2020-12-19 DIAGNOSIS — K5900 Constipation, unspecified: Secondary | ICD-10-CM | POA: Diagnosis not present

## 2020-12-19 DIAGNOSIS — I248 Other forms of acute ischemic heart disease: Secondary | ICD-10-CM | POA: Diagnosis not present

## 2020-12-19 DIAGNOSIS — Z79899 Other long term (current) drug therapy: Secondary | ICD-10-CM | POA: Diagnosis not present

## 2020-12-19 DIAGNOSIS — I509 Heart failure, unspecified: Secondary | ICD-10-CM

## 2020-12-19 DIAGNOSIS — N179 Acute kidney failure, unspecified: Secondary | ICD-10-CM | POA: Diagnosis not present

## 2020-12-19 DIAGNOSIS — M1990 Unspecified osteoarthritis, unspecified site: Secondary | ICD-10-CM | POA: Diagnosis not present

## 2020-12-19 DIAGNOSIS — Z8249 Family history of ischemic heart disease and other diseases of the circulatory system: Secondary | ICD-10-CM | POA: Diagnosis not present

## 2020-12-19 DIAGNOSIS — H4311 Vitreous hemorrhage, right eye: Secondary | ICD-10-CM | POA: Diagnosis not present

## 2020-12-19 DIAGNOSIS — I10 Essential (primary) hypertension: Secondary | ICD-10-CM | POA: Diagnosis not present

## 2020-12-19 DIAGNOSIS — Z20822 Contact with and (suspected) exposure to covid-19: Secondary | ICD-10-CM | POA: Diagnosis not present

## 2020-12-19 DIAGNOSIS — I130 Hypertensive heart and chronic kidney disease with heart failure and stage 1 through stage 4 chronic kidney disease, or unspecified chronic kidney disease: Secondary | ICD-10-CM | POA: Diagnosis not present

## 2020-12-19 DIAGNOSIS — E11319 Type 2 diabetes mellitus with unspecified diabetic retinopathy without macular edema: Secondary | ICD-10-CM | POA: Diagnosis not present

## 2020-12-19 DIAGNOSIS — K5909 Other constipation: Secondary | ICD-10-CM | POA: Diagnosis not present

## 2020-12-19 DIAGNOSIS — R0602 Shortness of breath: Secondary | ICD-10-CM | POA: Diagnosis present

## 2020-12-19 DIAGNOSIS — F17210 Nicotine dependence, cigarettes, uncomplicated: Secondary | ICD-10-CM | POA: Diagnosis not present

## 2020-12-19 DIAGNOSIS — E119 Type 2 diabetes mellitus without complications: Secondary | ICD-10-CM

## 2020-12-19 DIAGNOSIS — I5043 Acute on chronic combined systolic (congestive) and diastolic (congestive) heart failure: Secondary | ICD-10-CM | POA: Diagnosis not present

## 2020-12-19 DIAGNOSIS — I272 Pulmonary hypertension, unspecified: Secondary | ICD-10-CM | POA: Diagnosis not present

## 2020-12-19 DIAGNOSIS — N189 Chronic kidney disease, unspecified: Secondary | ICD-10-CM | POA: Diagnosis not present

## 2020-12-19 DIAGNOSIS — E1122 Type 2 diabetes mellitus with diabetic chronic kidney disease: Secondary | ICD-10-CM | POA: Diagnosis not present

## 2020-12-19 DIAGNOSIS — Z7982 Long term (current) use of aspirin: Secondary | ICD-10-CM | POA: Diagnosis not present

## 2020-12-19 DIAGNOSIS — I088 Other rheumatic multiple valve diseases: Secondary | ICD-10-CM | POA: Diagnosis not present

## 2020-12-19 LAB — TROPONIN I (HIGH SENSITIVITY)
Troponin I (High Sensitivity): 45 ng/L — ABNORMAL HIGH (ref <18)
Troponin I (High Sensitivity): 47 ng/L — ABNORMAL HIGH (ref <18)

## 2020-12-19 LAB — COMPREHENSIVE METABOLIC PANEL
ALT: 32 U/L (ref 0–44)
AST: 35 U/L (ref 15–41)
Albumin: 3.3 g/dL — ABNORMAL LOW (ref 3.5–5.0)
Alkaline Phosphatase: 122 U/L (ref 38–126)
Anion gap: 12 (ref 5–15)
BUN: 27 mg/dL — ABNORMAL HIGH (ref 8–23)
CO2: 21 mmol/L — ABNORMAL LOW (ref 22–32)
Calcium: 8.7 mg/dL — ABNORMAL LOW (ref 8.9–10.3)
Chloride: 103 mmol/L (ref 98–111)
Creatinine, Ser: 1.47 mg/dL — ABNORMAL HIGH (ref 0.44–1.00)
GFR, Estimated: 40 mL/min — ABNORMAL LOW (ref >60)
Glucose, Bld: 280 mg/dL — ABNORMAL HIGH (ref 70–99)
Potassium: 4.2 mmol/L (ref 3.5–5.1)
Sodium: 136 mmol/L (ref 135–145)
Total Bilirubin: 1.5 mg/dL — ABNORMAL HIGH (ref 0.3–1.2)
Total Protein: 7.5 g/dL (ref 6.5–8.1)

## 2020-12-19 LAB — CBC WITH DIFFERENTIAL/PLATELET
Abs Immature Granulocytes: 0.02 10*3/uL (ref 0.00–0.07)
Basophils Absolute: 0.1 10*3/uL (ref 0.0–0.1)
Basophils Relative: 1 %
Eosinophils Absolute: 0.1 10*3/uL (ref 0.0–0.5)
Eosinophils Relative: 1 %
HCT: 36.2 % (ref 36.0–46.0)
Hemoglobin: 11 g/dL — ABNORMAL LOW (ref 12.0–15.0)
Immature Granulocytes: 0 %
Lymphocytes Relative: 16 %
Lymphs Abs: 1.1 10*3/uL (ref 0.7–4.0)
MCH: 24.4 pg — ABNORMAL LOW (ref 26.0–34.0)
MCHC: 30.4 g/dL (ref 30.0–36.0)
MCV: 80.3 fL (ref 80.0–100.0)
Monocytes Absolute: 0.4 10*3/uL (ref 0.1–1.0)
Monocytes Relative: 6 %
Neutro Abs: 5 10*3/uL (ref 1.7–7.7)
Neutrophils Relative %: 76 %
Platelets: 310 10*3/uL (ref 150–400)
RBC: 4.51 MIL/uL (ref 3.87–5.11)
RDW: 18.3 % — ABNORMAL HIGH (ref 11.5–15.5)
WBC: 6.6 10*3/uL (ref 4.0–10.5)
nRBC: 0 % (ref 0.0–0.2)

## 2020-12-19 LAB — SARS CORONAVIRUS 2 (TAT 6-24 HRS): SARS Coronavirus 2: NEGATIVE

## 2020-12-19 LAB — BRAIN NATRIURETIC PEPTIDE: B Natriuretic Peptide: 3073.4 pg/mL — ABNORMAL HIGH (ref 0.0–100.0)

## 2020-12-19 MED ORDER — DOCUSATE SODIUM 100 MG PO CAPS
100.0000 mg | ORAL_CAPSULE | Freq: Every day | ORAL | Status: DC
Start: 2020-12-20 — End: 2020-12-23
  Administered 2020-12-20 – 2020-12-23 (×4): 100 mg via ORAL
  Filled 2020-12-19 (×4): qty 1

## 2020-12-19 MED ORDER — FUROSEMIDE 10 MG/ML IJ SOLN: 20.0000 mg | Freq: Two times a day (BID) | INTRAMUSCULAR | Status: DC

## 2020-12-19 MED ORDER — ENOXAPARIN SODIUM 40 MG/0.4ML ~~LOC~~ SOLN
40.0000 mg | SUBCUTANEOUS | Status: DC
  Administered 2020-12-20 – 2020-12-21 (×2): 40 mg via SUBCUTANEOUS
  Filled 2020-12-19 (×2): qty 0.4

## 2020-12-19 MED ORDER — SODIUM CHLORIDE 0.9% FLUSH: 3.0000 mL | INTRAVENOUS | Status: DC | PRN

## 2020-12-19 MED ORDER — HYDRALAZINE HCL 20 MG/ML IJ SOLN
10.0000 mg | Freq: Once | INTRAMUSCULAR | Status: AC
  Administered 2020-12-19: 10 mg via INTRAVENOUS
  Filled 2020-12-19: qty 1

## 2020-12-19 MED ORDER — ACETAMINOPHEN 325 MG PO TABS
650.0000 mg | ORAL_TABLET | ORAL | Status: DC | PRN
  Administered 2020-12-20: 650 mg via ORAL
  Filled 2020-12-19: qty 2

## 2020-12-19 MED ORDER — FUROSEMIDE 10 MG/ML IJ SOLN
20.0000 mg | Freq: Once | INTRAMUSCULAR | Status: AC
  Administered 2020-12-19: 20 mg via INTRAVENOUS
  Filled 2020-12-19: qty 2

## 2020-12-19 MED ORDER — ASPIRIN 81 MG PO CHEW
324.0000 mg | CHEWABLE_TABLET | Freq: Once | ORAL | Status: AC
  Administered 2020-12-19: 324 mg via ORAL
  Filled 2020-12-19: qty 4

## 2020-12-19 MED ORDER — SODIUM CHLORIDE 0.9 % IV SOLN: 250.0000 mL | INTRAVENOUS | Status: DC | PRN

## 2020-12-19 MED ORDER — METOPROLOL TARTRATE 25 MG PO TABS
25.0000 mg | ORAL_TABLET | Freq: Two times a day (BID) | ORAL | Status: AC
  Administered 2020-12-20 – 2020-12-22 (×7): 25 mg via ORAL
  Filled 2020-12-19 (×7): qty 1

## 2020-12-19 MED ORDER — FUROSEMIDE 10 MG/ML IJ SOLN
20.0000 mg | Freq: Two times a day (BID) | INTRAMUSCULAR | Status: DC
  Administered 2020-12-19 – 2020-12-21 (×5): 20 mg via INTRAVENOUS
  Filled 2020-12-19 (×5): qty 2

## 2020-12-19 MED ORDER — ASPIRIN EC 81 MG PO TBEC
81.0000 mg | DELAYED_RELEASE_TABLET | ORAL | Status: DC
Start: 2020-12-21 — End: 2020-12-23
  Administered 2020-12-21: 81 mg via ORAL
  Filled 2020-12-19 (×2): qty 1

## 2020-12-19 MED ORDER — SODIUM CHLORIDE 0.9% FLUSH
3.0000 mL | Freq: Two times a day (BID) | INTRAVENOUS | Status: DC
  Administered 2020-12-20 – 2020-12-22 (×7): 3 mL via INTRAVENOUS

## 2020-12-19 MED ORDER — INSULIN ASPART 100 UNIT/ML ~~LOC~~ SOLN
0.0000 [IU] | Freq: Three times a day (TID) | SUBCUTANEOUS | Status: DC
  Administered 2020-12-20: 3 [IU] via SUBCUTANEOUS
  Administered 2020-12-20: 2 [IU] via SUBCUTANEOUS
  Administered 2020-12-20: 3 [IU] via SUBCUTANEOUS
  Administered 2020-12-21: 1 [IU] via SUBCUTANEOUS
  Administered 2020-12-22 (×2): 2 [IU] via SUBCUTANEOUS

## 2020-12-19 MED ORDER — LORAZEPAM 1 MG PO TABS
1.0000 mg | ORAL_TABLET | Freq: Once | ORAL | Status: AC
  Administered 2020-12-19: 1 mg via ORAL
  Filled 2020-12-19: qty 1

## 2020-12-19 MED ORDER — VITAMIN D (ERGOCALCIFEROL) 1.25 MG (50000 UNIT) PO CAPS
50000.0000 [IU] | ORAL_CAPSULE | ORAL | Status: DC
  Administered 2020-12-20: 50000 [IU] via ORAL
  Filled 2020-12-19: qty 1

## 2020-12-19 MED ORDER — HYDRALAZINE HCL 20 MG/ML IJ SOLN
10.0000 mg | Freq: Four times a day (QID) | INTRAMUSCULAR | Status: DC | PRN
  Administered 2020-12-19 – 2020-12-21 (×2): 10 mg via INTRAVENOUS
  Filled 2020-12-19 (×2): qty 1

## 2020-12-19 MED ORDER — ONDANSETRON HCL 4 MG/2ML IJ SOLN: 4.0000 mg | Freq: Four times a day (QID) | INTRAMUSCULAR | Status: DC | PRN

## 2020-12-19 NOTE — ED Notes (Signed)
Pt resting comfortably on stretcher. No complaints of pain. BP elivated.

## 2020-12-19 NOTE — H&P (Signed)
History and Physical   Maria Foster DOB: 02-24-1956 DOA: 12/18/2020  Referring MD/NP/PA: Dr. Wilkie Aye  PCP: Raymon Mutton., FNP   Outpatient Specialists: None  Patient coming from: Home via med [facility]  Chief Complaint: Constipation and shortness of breath  HPI: Maria Foster is a 66 y.o. female with medical history significant of hypertension, diet-controlled diabetes, osteoarthritis who presented to med [facility] mainly complaining of significant constipation.  Patient has been on multiple laxatives without relief.  She was being worked up in the ER at [facility] when she complained of significant exertional dyspnea some PND and orthopnea.  She has longstanding hypertension and diabetes.  Has been taking amlodipine on and off but not consistently.  Patient also noted to have lower extremity edema.  Other laboratory and x-ray findings of new onset congestive heart failure.  She is admitted to the hospital for further evaluation and treatment.  Patient denied any prior heart disease.  No chest pain.  Has family history of heart disease in general but not sure of MI.  Has not been on any medications for her diabetes as well..  ED Course: Temperature 97.5 blood pressure 191/98 pulse 102 respirate 33 and initial oxygen sats 88% on room air.  Glucose is 280 sodium 136 potassium 4.2 chloride 103 CO2 21.  BUN 27 creatinine 1.47.  GFR of 40.  BNP 3073 initial troponin 45-second set is 47.  CBC largely within normal.  Chest x-ray showed enlarged cardiac shadow with mild improved aeration.  This is compared to one on urinary 14 2022 that showed CHF.  Review of Systems: As per HPI otherwise 10 point review of systems negative.    Past Medical History:  Diagnosis Date  . Arthritis   . Cataract   . Diabetes mellitus without complication (HCC)   . Hypertension     Past Surgical History:  Procedure Laterality Date  . PRP Bilateral OD - 07/23/2017 OS -  08/06/2017     reports that she has been smoking. She has been smoking about 0.50 packs per day. She has never used smokeless tobacco. She reports current alcohol use. She reports that she does not use drugs.  No Known Allergies  Family History  Problem Relation Age of Onset  . Amblyopia Neg Hx   . Blindness Neg Hx   . Cataracts Neg Hx   . Glaucoma Neg Hx   . Macular degeneration Neg Hx   . Retinal detachment Neg Hx   . Strabismus Neg Hx   . Retinitis pigmentosa Neg Hx      Prior to Admission medications   Medication Sig Start Date End Date Taking? Authorizing Provider  amLODipine (NORVASC) 10 MG tablet Take 1 tablet (10 mg total) by mouth daily. 11/29/16  Yes Earley Favor, NP  aspirin EC 81 MG tablet Take 81 mg by mouth every Monday, Wednesday, and Friday.   Yes [provider]  docusate sodium (COLACE) 100 MG capsule Take 100 mg by mouth daily. 12/12/20  Yes [provider]  naproxen sodium (ANAPROX) 220 MG tablet Take 220 mg by mouth daily as needed (pain).   Yes [provider]  Vitamin D, Ergocalciferol, (DRISDOL) 1.25 MG (50000 UNIT) CAPS capsule Take 50,000 Units by mouth once a week. 12/09/20  Yes [provider]  diphenhydrAMINE (BENADRYL) 25 MG tablet Take 0.5 tablets (12.5 mg total) by mouth every 6 (six) hours as needed (take with Reglan). Patient not taking:  No sig reported 11/29/16   Earley Favor, NP  metoCLOPramide (REGLAN) 10 MG tablet Take 0.5 tablets (5 mg total) by mouth every 6 (six) hours as needed for nausea. 11/29/16   Earley Favor, NP  prednisoLONE acetate (PRED FORTE) 1 % ophthalmic suspension Place 1 drop into the right eye 4 (four) times daily. 07/23/17   Rennis Chris, MD    Physical Exam: Vitals:   12/19/20 2030 12/19/20 2130 12/19/20 2220 12/19/20 2315  BP: (!) 179/114 (!) 186/92 (!) 183/108 (!) 184/105  Pulse: 98 96 97 (!) 102  Resp: (!) 24 17 16 18   Temp:  98.2 F (36.8 C) 98.2 F (36.8 C) (!) 97.5 F (36.4 C)   TempSrc:  Oral Oral Oral  SpO2: 97% 96% 97% 92%      Constitutional: Acutely ill looking, no distress Vitals:   12/19/20 2030 12/19/20 2130 12/19/20 2220 12/19/20 2315  BP: (!) 179/114 (!) 186/92 (!) 183/108 (!) 184/105  Pulse: 98 96 97 (!) 102  Resp: (!) 24 17 16 18   Temp:  98.2 F (36.8 C) 98.2 F (36.8 C) (!) 97.5 F (36.4 C)  TempSrc:  Oral Oral Oral  SpO2: 97% 96% 97% 92%   Eyes: PERRL, lids and conjunctivae normal ENMT: Mucous membranes are moist. Posterior pharynx clear of any exudate or lesions.Normal dentition.  Neck: normal, supple, no masses, no thyromegaly Respiratory: clear to auscultation bilaterally, no wheezing, no crackles. Normal respiratory effort. No accessory muscle use.  Cardiovascular: Regular rate and rhythm, no murmurs / rubs / gallops.  1+ pedal edema. 2+ pedal pulses. No carotid bruits.  Abdomen: no tenderness, no masses palpated. No hepatosplenomegaly. Bowel sounds positive.  Musculoskeletal: no clubbing / cyanosis. No joint deformity upper and lower extremities. Good ROM, no contractures. Normal muscle tone.  Skin: no rashes, lesions, ulcers. No induration Neurologic: CN 2-12 grossly intact. Sensation intact, DTR normal. Strength 5/5 in all 4.  Psychiatric: Normal judgment and insight. Alert and oriented x 3. Normal mood.     Labs on Admission: I have personally reviewed following labs and imaging studies  CBC: Recent Labs  Lab 12/19/20 0030  WBC 6.6  NEUTROABS 5.0  HGB 11.0*  HCT 36.2  MCV 80.3  PLT 310   Basic Metabolic Panel: Recent Labs  Lab 12/19/20 0030  NA 136  K 4.2  CL 103  CO2 21*  GLUCOSE 280*  BUN 27*  CREATININE 1.47*  CALCIUM 8.7*   GFR: CrCl cannot be calculated (Unknown ideal weight.). Liver Function Tests: Recent Labs  Lab 12/19/20 0030  AST 35  ALT 32  ALKPHOS 122  BILITOT 1.5*  PROT 7.5  ALBUMIN 3.3*   No results for input(s): LIPASE, AMYLASE in the last 168 hours. No results for input(s):  AMMONIA in the last 168 hours. Coagulation Profile: No results for input(s): INR, PROTIME in the last 168 hours. Cardiac Enzymes: No results for input(s): CKTOTAL, CKMB, CKMBINDEX, TROPONINI in the last 168 hours. BNP (last 3 results) No results for input(s): PROBNP in the last 8760 hours. HbA1C: No results for input(s): HGBA1C in the last 72 hours. CBG: No results for input(s): GLUCAP in the last 168 hours. Lipid Profile: No results for input(s): CHOL, HDL, LDLCALC, TRIG, CHOLHDL, LDLDIRECT in the last 72 hours. Thyroid Function Tests: No results for input(s): TSH, T4TOTAL, FREET4, T3FREE, THYROIDAB in the last 72 hours. Anemia Panel: No results for input(s): VITAMINB12, FOLATE, FERRITIN, TIBC, IRON, RETICCTPCT in the last 72 hours. Urine analysis:    Component  Value Date/Time   COLORURINE YELLOW 11/29/2016 1940   APPEARANCEUR HAZY (A) 11/29/2016 1940   LABSPEC 1.015 11/29/2016 1940   PHURINE 6.0 11/29/2016 1940   GLUCOSEU >=500 (A) 11/29/2016 1940   HGBUR SMALL (A) 11/29/2016 1940   BILIRUBINUR NEGATIVE 11/29/2016 1940   KETONESUR 20 (A) 11/29/2016 1940   PROTEINUR 100 (A) 11/29/2016 1940   UROBILINOGEN 1.0 10/17/2009 1021   NITRITE NEGATIVE 11/29/2016 1940   LEUKOCYTESUR NEGATIVE 11/29/2016 1940   Sepsis Labs: @LABRCNTIP (procalcitonin:4,lacticidven:4) ) Recent Results (from the past 240 hour(s))  SARS CORONAVIRUS 2 (TAT 6-24 HRS) Nasopharyngeal Nasopharyngeal Swab     Status: None   Collection Time: 12/19/20 12:32 AM   Specimen: Nasopharyngeal Swab  Result Value Ref Range Status   SARS Coronavirus 2 NEGATIVE NEGATIVE Final    Comment: (NOTE) SARS-CoV-2 target nucleic acids are NOT DETECTED.  The SARS-CoV-2 RNA is generally detectable in upper and lower respiratory specimens during the acute phase of infection. Negative results do not preclude SARS-CoV-2 infection, do not rule out co-infections with other pathogens, and should not be used as the sole basis for  treatment or other patient management decisions. Negative results must be combined with clinical observations, patient history, and epidemiological information. The expected result is Negative.  Fact Sheet for Patients: HairSlick.no  Fact Sheet for Healthcare Providers: quierodirigir.com  This test is not yet approved or cleared by the [place] FDA and  has been authorized for detection and/or diagnosis of SARS-CoV-2 by FDA under an Emergency Use Authorization (EUA). This EUA will remain  in effect (meaning this test can be used) for the duration of the COVID-19 declaration under Se ction 564(b)(1) of the Act, 21 U.S.C. section 360bbb-3(b)(1), unless the authorization is terminated or revoked sooner.  Performed at [facility], [address] [place], [place] [address]      Radiological Exams on Admission: DG Abdomen Acute W/Chest  Result Date: 12/19/2020 CLINICAL DATA:  Shortness of breath and constipation EXAM: DG ABDOMEN ACUTE WITH 1 VIEW CHEST COMPARISON:  12/02/2020 FINDINGS: Cardiac shadow is mildly enlarged. Aortic calcifications are seen. Patchy airspace opacity is noted although significantly improved when compared with prior exam. Scattered large and small bowel gas is noted. No free air is seen. No abnormal mass or abnormal calcifications are noted. Mild retained fecal material consistent with constipation is seen although no obstructive changes noted. No bony abnormality is seen. IMPRESSION: Improved aeration in the lungs bilaterally. Changes of mild constipation. Electronically Signed   By: Alcide Clever M.D.   On: 12/19/2020 00:26    EKG: Independently reviewed.  Sinus rhythm no acute findings  Assessment/Plan Principal Problem:   Acute congestive heart failure (HCC) Active Problems:   Benign essential HTN   Diabetes (HCC)   Constipation   AKI (acute kidney injury) (HCC)     #1 acute congestive  heart failure: Probably diastolic.  No prior echocardiogram.  Patient will be admitted.  Initiate diuresis.  Get echocardiogram in the morning.  May get cardiology consultation if indicated.  #2 hypertension: Currently markedly elevated.  Not in any medicine.  Due to lower extremity edema I will hold off on amlodipine.  Initiate diuretics.  Beta-blocker.  Avoid ACEI due to renal insufficiency.  We may use hydralazine if needed.  #3 diabetes: Not on any medicine but blood sugar markedly elevated.  Initiate sliding scale insulin and monitor.  #4 acute on chronic constipation: Patient will be placed on scheduled laxatives.  She has been treated initially.  Seems to have had some bowel movement so far.  #5 AKI: Suspected chronic kidney disease as well due to diabetes and hypertension.  Monitor renal function.   DVT prophylaxis: Lovenox Code Status: Full code Family Communication: No family at bedside Disposition Plan: Home Consults called: None Admission status: Inpatient  Severity of Illness: The appropriate patient status for this patient is INPATIENT. Inpatient status is judged to be reasonable and necessary in order to provide the required intensity of service to ensure the patient's safety. The patient's presenting symptoms, physical exam findings, and initial radiographic and laboratory data in the context of their chronic comorbidities is felt to place them at high risk for further clinical deterioration. Furthermore, it is not anticipated that the patient will be medically stable for discharge from the hospital within 2 midnights of admission. The following factors support the patient status of inpatient.   " The patient's presenting symptoms include constipation and shortness of breath. " The worrisome physical exam findings include lower extremity edema with basilar crackles in the lung. " The initial radiographic and laboratory data are worrisome because of evidence of CHF with markedly  elevated BNP. " The chronic co-morbidities include hypertension diabetes.   * I certify that at the point of admission it is my clinical judgment that the patient will require inpatient hospital care spanning beyond 2 midnights from the point of admission due to high intensity of service, high risk for further deterioration and high frequency of surveillance required.Lonia Blood MD [facility] Pager [phone]  If 7PM-7AM, please contact night-coverage [email or web address] Password [facility]  12/19/2020, 11:45 PM

## 2020-12-19 NOTE — ED Notes (Signed)
Husband went home to take care of the animals, will call when bed assigned.

## 2020-12-19 NOTE — ED Notes (Signed)
Pt urinated in bed. Complete bed and gown change

## 2020-12-19 NOTE — ED Notes (Signed)
RN rounding on pt. Pt found to be sitting up in bed, very tearful. Pt states she feels hot and feels like "everything is on me". VSS. EDP to bedside to reassess. Orders given.

## 2020-12-19 NOTE — ED Provider Notes (Addendum)
[facility] EMERGENCY DEPARTMENT Provider Note   CSN: [id number] Arrival date & time: 12/18/20  2314     History Chief Complaint  Patient presents with  . Weakness    Maria Foster is a 65 y.o. female.  HPI     This is a 65 year old female with a history of diabetes and hypertension who presents with generalized weakness and constipation.  Patient reports several week history of constipation.  She saw her primary physician and was started on Maalox, MiraLAX, and Metamucil.  She has been able to have a bowel movement with this but "it still does not seem normal."  She reports over the last week she has had progressive generalized weakness.  She describes not being able to get up and go normally.  She also describes some shortness of breath.  She states that she feels short of breath with minimal exertion.  No chest pain.  She has had some lower extremity swelling.  No known history of heart failure.  She denies any recent fevers, cough.  No known sick contacts or Covid exposures.  Past Medical History:  Diagnosis Date  . Arthritis   . Cataract   . Diabetes mellitus without complication (HCC)   . Hypertension     There are no problems to display for this patient.   Past Surgical History:  Procedure Laterality Date  . PRP Bilateral OD - 07/23/2017 OS - 08/06/2017     OB History   No obstetric history on file.     Family History  Problem Relation Age of Onset  . Amblyopia Neg Hx   . Blindness Neg Hx   . Cataracts Neg Hx   . Glaucoma Neg Hx   . Macular degeneration Neg Hx   . Retinal detachment Neg Hx   . Strabismus Neg Hx   . Retinitis pigmentosa Neg Hx     Social History   Tobacco Use  . Smoking status: Current Every Day Smoker    Packs/day: 0.50  . Smokeless tobacco: Never Used  Substance Use Topics  . Alcohol use: Yes    Comment: occ  . Drug use: No    Home Medications Prior to Admission medications   Medication Sig Start Date End Date  Taking? Authorizing Provider  amLODipine (NORVASC) 10 MG tablet Take 1 tablet (10 mg total) by mouth daily. Patient not taking: Reported on 07/23/2017 11/29/16   Earley Favor, NP  aspirin EC 81 MG tablet Take 81 mg by mouth every Monday, Wednesday, and Friday.    [provider]  diphenhydrAMINE (BENADRYL) 25 MG tablet Take 0.5 tablets (12.5 mg total) by mouth every 6 (six) hours as needed (take with Reglan). Patient not taking: Reported on 07/23/2017 11/29/16   Earley Favor, NP  metoCLOPramide (REGLAN) 10 MG tablet Take 0.5 tablets (5 mg total) by mouth every 6 (six) hours as needed for nausea. Patient not taking: Reported on 07/23/2017 11/29/16   Earley Favor, NP  naproxen sodium (ANAPROX) 220 MG tablet Take 220 mg by mouth daily as needed (pain).    [provider]  prednisoLONE acetate (PRED FORTE) 1 % ophthalmic suspension Place 1 drop into the right eye 4 (four) times daily. 07/23/17   Rennis Chris, MD    Allergies    Patient has no known allergies.  Review of Systems   Review of Systems  Constitutional: Negative for fever.  Respiratory: Positive for shortness of breath. Negative for cough.   Cardiovascular: Positive for leg swelling. Negative  for chest pain.  Gastrointestinal: Positive for constipation. Negative for abdominal pain, nausea and vomiting.  Genitourinary: Negative for dysuria.  All other systems reviewed and are negative.   Physical Exam Updated Vital Signs BP (!) 191/112   Pulse 93   Temp 97.6 F (36.4 C)   Resp 19   SpO2 96%   Physical Exam Vitals and nursing note reviewed.  Constitutional:      Appearance: She is well-developed and well-nourished.     Comments: Tearful, nontoxic-appearing  HENT:     Head: Normocephalic and atraumatic.     Nose: Nose normal.     Mouth/Throat:     Mouth: Mucous membranes are moist.  Eyes:     Pupils: Pupils are equal, round, and reactive to light.  Cardiovascular:     Rate and Rhythm: Normal rate and regular  rhythm.     Heart sounds: Normal heart sounds.  Pulmonary:     Effort: Pulmonary effort is normal. No respiratory distress.     Breath sounds: No wheezing.  Abdominal:     General: Bowel sounds are normal.     Palpations: Abdomen is soft.     Tenderness: There is no abdominal tenderness. There is no guarding or rebound.  Musculoskeletal:     Cervical back: Neck supple.     Right lower leg: Edema present.     Left lower leg: Edema present.  Skin:    General: Skin is warm and dry.  Neurological:     Mental Status: She is alert and oriented to person, place, and time.  Psychiatric:        Mood and Affect: Mood and affect normal.     Comments: Anxious and tearful     ED Results / Procedures / Treatments   Labs (all labs ordered are listed, but only abnormal results are displayed) Labs Reviewed  CBC WITH DIFFERENTIAL/PLATELET - Abnormal; Notable for the following components:      Result Value   Hemoglobin 11.0 (*)    MCH 24.4 (*)    RDW 18.3 (*)    All other components within normal limits  COMPREHENSIVE METABOLIC PANEL - Abnormal; Notable for the following components:   CO2 21 (*)    Glucose, Bld 280 (*)    BUN 27 (*)    Creatinine, Ser 1.47 (*)    Calcium 8.7 (*)    Albumin 3.3 (*)    Total Bilirubin 1.5 (*)    GFR, Estimated 40 (*)    All other components within normal limits  BRAIN NATRIURETIC PEPTIDE - Abnormal; Notable for the following components:   B Natriuretic Peptide 3,073.4 (*)    All other components within normal limits  TROPONIN I (HIGH SENSITIVITY) - Abnormal; Notable for the following components:   Troponin I (High Sensitivity) 45 (*)    All other components within normal limits  SARS CORONAVIRUS 2 (TAT 6-24 HRS)  URINALYSIS, ROUTINE W REFLEX MICROSCOPIC  TROPONIN I (HIGH SENSITIVITY)    EKG EKG Interpretation  Date/Time:  Monday December 19 2020 00:03:10 EST Ventricular Rate:  96 PR Interval:    QRS Duration: 104 QT Interval:  390 QTC  Calculation: 493 R Axis:   -66 Text Interpretation: Sinus rhythm LAD, consider left anterior fascicular block Left ventricular hypertrophy Borderline prolonged QT interval Baseline wander in lead(s) V1 Confirmed by Ross Marcus ([id number]) on 12/19/2020 1:01:32 AM   Radiology DG Abdomen Acute W/Chest  Result Date: 12/19/2020 CLINICAL DATA:  Shortness of breath and constipation EXAM:  DG ABDOMEN ACUTE WITH 1 VIEW CHEST COMPARISON:  12/02/2020 FINDINGS: Cardiac shadow is mildly enlarged. Aortic calcifications are seen. Patchy airspace opacity is noted although significantly improved when compared with prior exam. Scattered large and small bowel gas is noted. No free air is seen. No abnormal mass or abnormal calcifications are noted. Mild retained fecal material consistent with constipation is seen although no obstructive changes noted. No bony abnormality is seen. IMPRESSION: Improved aeration in the lungs bilaterally. Changes of mild constipation. Electronically Signed   By: Alcide Clever M.D.   On: 12/19/2020 00:26    Procedures Procedures   CRITICAL CARE Performed by: Shon Baton   Total critical care time:45 minutes  Critical care time was exclusive of separately billable procedures and treating other patients.  Critical care was necessary to treat or prevent imminent or life-threatening deterioration.  Critical care was time spent personally by me on the following activities: development of treatment plan with patient and/or surrogate as well as nursing, discussions with consultants, evaluation of patient's response to treatment, examination of patient, obtaining history from patient or surrogate, ordering and performing treatments and interventions, ordering and review of laboratory studies, ordering and review of radiographic studies, pulse oximetry and re-evaluation of patient's condition.   Medications Ordered in ED Medications  aspirin chewable tablet 324 mg (has no  administration in time range)  hydrALAZINE (APRESOLINE) injection 10 mg (has no administration in time range)  furosemide (LASIX) injection 20 mg (has no administration in time range)  hydrALAZINE (APRESOLINE) injection 10 mg (has no administration in time range)  furosemide (LASIX) injection 20 mg (20 mg Intravenous Given 12/19/20 0219)    ED Course  I have reviewed the triage vital signs and the nursing notes.  Pertinent labs & imaging results that were available during my care of the patient were reviewed by me and considered in my medical decision making (see chart for details).  Clinical Course as of 12/19/20 0339  Mon Dec 19, 2020  0259 Discussed work-up with patient.  She is notably hypertensive.  She states she does not normally take blood pressure medications.  Her work-up is concerning for new onset heart failure versus hypertensive emergency.  Troponin is elevated but she has not had any chest pain and her EKG does not show any acute evidence of ischemia.  May be related to AKI and ongoing hypertension.  She was given 20 mg of Lasix.  Will admit for optimization and further evaluation for echocardiogram.  Patient was given IV hydralazine [CH]  0301 Of note, nursing did note that patient reported some shortness of breath positionally and not being able to get comfortable. [CH]  [phone] Spoke with Dr. Martyn Malay.  We will plan [facility] admission to [facility].  Will start on Lasix 20 mg twice daily and as needed hydralazine for blood pressure. [CH]    Clinical Course User Index [CH] Xaiver Roskelley, Mayer Masker, MD   MDM Rules/Calculators/A&P                          Patient presents with generalized weakness.  She also states she has recently been constipated and short of breath.  She is tearful but nontoxic.  Vital signs notable for blood pressure of 193/105.  History of high blood pressure but does not take blood pressure medications per the patient.  She does have some soft signs of possible heart  failure with edema and shortness of breath.  She is not  hypoxic or in respiratory distress.  She is not currently having active chest pain.  Considerations include but not limited to, heart failure, atypical ACS, pneumonia or infection.  EKG shows no evidence of acute ischemia or arrhythmia.  There are some LVH changes.  BNP is greater than 3000.  Metabolic panel with slightly elevated creatinine at 1.47.  Baseline 0.7 2.9.  She clinically however, appears volume overloaded.  Troponin is 45.  No active chest pain.  May be related to hypertension and demand.  On multiple rechecks, patient is comfortable.  She was given 20 mg of Lasix as she is Lasix nave and has slight AKI.  She was also given full dose aspirin.  She is not currently taking any blood pressure medications although previously has had amlodipine listed.  IV hydralazine ordered for hypertensive urgency/emergency.  Will plan for admission to the hospital for diuresis, echocardiogram, further cardiac evaluation with trending of troponins.  At this time I do not believe her presentation is consistent with ACS but more likely new onset heart failure and complications related to ongoing hypertension.  Doubt infectious process and Covid testing is pending.   Final Clinical Impression(s) / ED Diagnoses Final diagnoses:  Hypertensive emergency  Acute heart failure, unspecified heart failure type (HCC)  Elevated troponin    Rx / DC Orders ED Discharge Orders    None       Yassen Kinnett, Mayer Masker, MD 12/19/20 4193    Shon Baton, MD 12/19/20 [phone]

## 2020-12-20 ENCOUNTER — Observation Stay (HOSPITAL_COMMUNITY): Payer: Medicare Other

## 2020-12-20 DIAGNOSIS — Z79899 Other long term (current) drug therapy: Secondary | ICD-10-CM | POA: Diagnosis not present

## 2020-12-20 DIAGNOSIS — Z7982 Long term (current) use of aspirin: Secondary | ICD-10-CM | POA: Diagnosis not present

## 2020-12-20 DIAGNOSIS — I340 Nonrheumatic mitral (valve) insufficiency: Secondary | ICD-10-CM | POA: Diagnosis not present

## 2020-12-20 DIAGNOSIS — E1122 Type 2 diabetes mellitus with diabetic chronic kidney disease: Secondary | ICD-10-CM | POA: Diagnosis present

## 2020-12-20 DIAGNOSIS — F17210 Nicotine dependence, cigarettes, uncomplicated: Secondary | ICD-10-CM | POA: Diagnosis present

## 2020-12-20 DIAGNOSIS — I130 Hypertensive heart and chronic kidney disease with heart failure and stage 1 through stage 4 chronic kidney disease, or unspecified chronic kidney disease: Secondary | ICD-10-CM | POA: Diagnosis present

## 2020-12-20 DIAGNOSIS — I088 Other rheumatic multiple valve diseases: Secondary | ICD-10-CM | POA: Diagnosis present

## 2020-12-20 DIAGNOSIS — I351 Nonrheumatic aortic (valve) insufficiency: Secondary | ICD-10-CM

## 2020-12-20 DIAGNOSIS — R778 Other specified abnormalities of plasma proteins: Secondary | ICD-10-CM | POA: Diagnosis not present

## 2020-12-20 DIAGNOSIS — E11319 Type 2 diabetes mellitus with unspecified diabetic retinopathy without macular edema: Secondary | ICD-10-CM | POA: Diagnosis present

## 2020-12-20 DIAGNOSIS — I509 Heart failure, unspecified: Secondary | ICD-10-CM

## 2020-12-20 DIAGNOSIS — I248 Other forms of acute ischemic heart disease: Secondary | ICD-10-CM | POA: Diagnosis present

## 2020-12-20 DIAGNOSIS — I5041 Acute combined systolic (congestive) and diastolic (congestive) heart failure: Secondary | ICD-10-CM | POA: Diagnosis not present

## 2020-12-20 DIAGNOSIS — N189 Chronic kidney disease, unspecified: Secondary | ICD-10-CM | POA: Diagnosis present

## 2020-12-20 DIAGNOSIS — Z20822 Contact with and (suspected) exposure to covid-19: Secondary | ICD-10-CM | POA: Diagnosis present

## 2020-12-20 DIAGNOSIS — K5909 Other constipation: Secondary | ICD-10-CM | POA: Diagnosis present

## 2020-12-20 DIAGNOSIS — I272 Pulmonary hypertension, unspecified: Secondary | ICD-10-CM | POA: Diagnosis present

## 2020-12-20 DIAGNOSIS — R0602 Shortness of breath: Secondary | ICD-10-CM | POA: Diagnosis present

## 2020-12-20 DIAGNOSIS — N179 Acute kidney failure, unspecified: Secondary | ICD-10-CM | POA: Diagnosis present

## 2020-12-20 DIAGNOSIS — M1990 Unspecified osteoarthritis, unspecified site: Secondary | ICD-10-CM | POA: Diagnosis present

## 2020-12-20 DIAGNOSIS — H4311 Vitreous hemorrhage, right eye: Secondary | ICD-10-CM | POA: Diagnosis present

## 2020-12-20 DIAGNOSIS — I10 Essential (primary) hypertension: Secondary | ICD-10-CM | POA: Diagnosis not present

## 2020-12-20 DIAGNOSIS — Z8249 Family history of ischemic heart disease and other diseases of the circulatory system: Secondary | ICD-10-CM | POA: Diagnosis not present

## 2020-12-20 DIAGNOSIS — I5043 Acute on chronic combined systolic (congestive) and diastolic (congestive) heart failure: Secondary | ICD-10-CM | POA: Diagnosis present

## 2020-12-20 DIAGNOSIS — I361 Nonrheumatic tricuspid (valve) insufficiency: Secondary | ICD-10-CM

## 2020-12-20 LAB — CBC
HCT: 33.7 % — ABNORMAL LOW (ref 36.0–46.0)
Hemoglobin: 10.4 g/dL — ABNORMAL LOW (ref 12.0–15.0)
MCH: 24.4 pg — ABNORMAL LOW (ref 26.0–34.0)
MCHC: 30.9 g/dL (ref 30.0–36.0)
MCV: 79.1 fL — ABNORMAL LOW (ref 80.0–100.0)
Platelets: 245 10*3/uL (ref 150–400)
RBC: 4.26 MIL/uL (ref 3.87–5.11)
RDW: 18.1 % — ABNORMAL HIGH (ref 11.5–15.5)
WBC: 7.4 10*3/uL (ref 4.0–10.5)
nRBC: 0 % (ref 0.0–0.2)

## 2020-12-20 LAB — GLUCOSE, CAPILLARY
Glucose-Capillary: 180 mg/dL — ABNORMAL HIGH (ref 70–99)
Glucose-Capillary: 226 mg/dL — ABNORMAL HIGH (ref 70–99)
Glucose-Capillary: 232 mg/dL — ABNORMAL HIGH (ref 70–99)
Glucose-Capillary: 238 mg/dL — ABNORMAL HIGH (ref 70–99)
Glucose-Capillary: 79 mg/dL (ref 70–99)
Glucose-Capillary: 82 mg/dL (ref 70–99)

## 2020-12-20 LAB — ECHOCARDIOGRAM COMPLETE
Area-P 1/2: 4.21 cm2
Height: 63 in
P 1/2 time: 391 msec
S' Lateral: 3.7 cm
Weight: 2271.62 oz

## 2020-12-20 LAB — BASIC METABOLIC PANEL
Anion gap: 10 (ref 5–15)
BUN: 23 mg/dL (ref 8–23)
CO2: 23 mmol/L (ref 22–32)
Calcium: 8.5 mg/dL — ABNORMAL LOW (ref 8.9–10.3)
Chloride: 103 mmol/L (ref 98–111)
Creatinine, Ser: 1.23 mg/dL — ABNORMAL HIGH (ref 0.44–1.00)
GFR, Estimated: 49 mL/min — ABNORMAL LOW (ref >60)
Glucose, Bld: 284 mg/dL — ABNORMAL HIGH (ref 70–99)
Potassium: 3.3 mmol/L — ABNORMAL LOW (ref 3.5–5.1)
Sodium: 136 mmol/L (ref 135–145)

## 2020-12-20 LAB — HEMOGLOBIN A1C
Hgb A1c MFr Bld: 8.6 % — ABNORMAL HIGH (ref 4.8–5.6)
Mean Plasma Glucose: 200.12 mg/dL

## 2020-12-20 LAB — HIV ANTIBODY (ROUTINE TESTING W REFLEX): HIV Screen 4th Generation wRfx: NONREACTIVE

## 2020-12-20 MED ORDER — INSULIN GLARGINE 100 UNIT/ML ~~LOC~~ SOLN
12.0000 [IU] | Freq: Every day | SUBCUTANEOUS | Status: DC
  Administered 2020-12-20 – 2020-12-21 (×2): 12 [IU] via SUBCUTANEOUS
  Filled 2020-12-20 (×2): qty 0.12

## 2020-12-20 MED ORDER — POTASSIUM CHLORIDE CRYS ER 20 MEQ PO TBCR
40.0000 meq | EXTENDED_RELEASE_TABLET | Freq: Every day | ORAL | Status: DC
  Administered 2020-12-20 – 2020-12-23 (×4): 40 meq via ORAL
  Filled 2020-12-20 (×4): qty 2

## 2020-12-20 MED ORDER — METFORMIN HCL 850 MG PO TABS
850.0000 mg | ORAL_TABLET | Freq: Two times a day (BID) | ORAL | Status: DC
  Administered 2020-12-20 – 2020-12-21 (×2): 850 mg via ORAL
  Filled 2020-12-20 (×3): qty 1

## 2020-12-20 NOTE — Plan of Care (Signed)
  Problem: Activity: Goal: Capacity to carry out activities will improve Outcome: Progressing   Problem: Clinical Measurements: Goal: Diagnostic test results will improve Outcome: Progressing   Problem: Pain Managment: Goal: General experience of comfort will improve Outcome: Progressing   Problem: Elimination: Goal: Will not experience complications related to bowel motility Outcome: Progressing

## 2020-12-20 NOTE — Progress Notes (Signed)
  Echocardiogram 2D Echocardiogram has been performed.  Maria Foster 12/20/2020, 11:32 AM

## 2020-12-20 NOTE — Progress Notes (Signed)
Inpatient Diabetes Program Recommendations  AACE/ADA: New Consensus Statement on Inpatient Glycemic Control (2015)  Target Ranges:  Prepandial:   less than 140 mg/dL      Peak postprandial:   less than 180 mg/dL (1-2 hours)      Critically ill patients:  140 - 180 mg/dL   Lab Results  Component Value Date   GLUCAP 232 (H) 12/20/2020   HGBA1C 8.6 (H) 12/20/2020    Review of Glycemic Control  Diabetes history: DM2 Outpatient Diabetes medications: None Current orders for Inpatient glycemic control: Novolog 0-9 units TID with meals  HgbA1C - 8.6% Blood sugars elevated above goal of 140-180 mg/dL. Needs basal and meal coverage insulin.  Inpatient Diabetes Program Recommendations:     Add Lantus 10 units QD Add Novolog 3 units TID for meal coverage insulin if eating > 50% meal Add CHO mod to heart healthy diet.  Continue to follow.  Thank you. Ailene Ards, RD, LDN, CDE Inpatient Diabetes Coordinator [phone]

## 2020-12-20 NOTE — Progress Notes (Signed)
Initial Nutrition Assessment  DOCUMENTATION CODES:   Not applicable  INTERVENTION:  - will monitor for nutrition-related needs prior to d/c.  NUTRITION DIAGNOSIS:   Increased nutrient needs related to acute illness as evidenced by estimated needs.  GOAL:   Patient will meet greater than or equal to 90% of their needs  MONITOR:   PO intake,Labs,Weight trends,I & O's  REASON FOR ASSESSMENT:   Malnutrition Screening Tool  ASSESSMENT:   65 y.o. female with medical history of HTN, diet-controlled DM, and osteoarthritis. She presented to [facility] complaining of constipation; she tried multiple laxatives without relief. In the ED she was noted to have BLE edema and CXR showed new onset CHF.  She consumed 100% of breakfast this AM (262 kcal, 13 grams protein). Lunch had not yet been delivered at the time of visit.   Patient reports severe constipation with no BM for several days prior to ED visit. Due to this, she was having abdominal distention and cramping which led to decreased appetite/desire to eat and associated decreased PO intake.   Weight today is 142 lb and PTA the most recently documented weight was on 11/08/12 when she weighed 162 lb. Non-pitting edema noted to BLE; flow sheet documentation indicates the had very deep pitting edema to BLE in the ED.   DM Coordinator saw patient this AM.    Labs reviewed; CBGs: 238, 232, 226 mg/dl, K: 3.3 mmol/l, creatinine: 1.23 mg/dl, Ca: 8.5 mg/dl, GFR: 49 ml/min.  Medications reviewed; 100 mg colace once/day, 20 mg IV lasix BID, sliding scale novolog, 12 units lantus/day, 850 mg metformin BID, 40 mEq Klor-Con/day, 50000 units drisdol weekly.    Diet Order:   Diet Order            Diet Heart Room service appropriate? Yes; Fluid consistency: Thin  Diet effective now                 EDUCATION NEEDS:   Not appropriate for education at this time  Skin:  Skin Assessment: Reviewed RN Assessment  Last BM:   1/30  Height:   Ht Readings from Last 1 Encounters:  12/20/20 5\' 3"  (1.6 m)    Weight:   Wt Readings from Last 1 Encounters:  12/20/20 64.4 kg    Estimated Nutritional Needs:  Kcal:  1400-1600 kcal Protein:  60-75 grams Fluid:  >/= 1.5 L/day      02/17/21, MS, RD, LDN, CNSC Inpatient Clinical Dietitian RD pager # available in AMION  After hours/weekend pager # available in [facility]

## 2020-12-20 NOTE — Progress Notes (Signed)
PROGRESS NOTE   Maria Foster  ZGY:[id number] DOB: 26-Apr-1956 DOA: 12/18/2020 PCP: Raymon Mutton., FNP  Brief Narrative:  28 fem from home DM TY 2 with retinopathy both eyes and vitreous hemorrhage on the right, prior admission for diverticulitis in 2011 HTN Prior colonoscopy Dr. Elnoria Howard in the past Admitted to med [facility] mainly with constipation found to have lower extremity edema that is subacute bnp was 3000 troponin 45 cbc normal Oxygen saturation 88% on room air     Assessment & Plan:   Principal Problem:   Acute congestive heart failure (HCC) Active Problems:   Benign essential HTN   Diabetes (HCC)   Constipation   AKI (acute kidney injury) (HCC)   1. Acute decompensated HFpEF likely a. Give Lasix 20 IV twice daily as does not take Lasix at home continue metoprolol 25 twice daily b. Consider addition of ACE inhibitor c. Strict I's and O's d. Last weight according to the patient was 2. Diabetes mellitus with retinopathy-A1c 8.6 a. Continue sliding scale coverage sensitive going forward b. Not on home meds previously has been on Metformin which we will resume c. Recommending Lantus 12 units to start in addition to the Metformin 850 twice daily started this admission and will need teaching of the same 3. Vitreous hemorrhage a. Follow-up as needed with Dr. Kerry Fort prednisolone eyedrops 4. HTN a. As above  DVT prophylaxis: Lovenox Code Status: Full Family Communication: None Disposition:  Status is: Observation  The patient will require care spanning > 2 midnights and should be moved to inpatient because: Persistent severe electrolyte disturbances, IV treatments appropriate due to intensity of illness or inability to take PO and Inpatient level of care appropriate due to severity of illness  Dispo: The patient is from: Home              Anticipated d/c is to: Home              Anticipated d/c date is: 3 days              Patient currently is  not medically stable to d/c.   Difficult to place patient No       Consultants:   None  Procedures: No  Antimicrobials: None   Subjective: Awake coherent no distress getting echo at this time no chest pain no fever has had swelling in the lower extremity for about 3 weeks no nausea no vomiting  Objective: Vitals:   12/20/20 0043 12/20/20 0048 12/20/20 0400 12/20/20 0444  BP: (!) 181/89 (!) 181/89 139/84   Pulse: (!) 102 (!) 103 83   Resp:   18   Temp:   97.7 F (36.5 C)   TempSrc:   Oral   SpO2:  98% 97%   Weight:    64.4 kg  Height:    5\' 3"  (1.6 m)    Intake/Output Summary (Last 24 hours) at 12/20/2020 02/17/2021 Last data filed at 12/20/2020 0400 Gross per 24 hour  Intake 3 ml  Output 1700 ml  Net -1697 ml   Filed Weights   12/19/20 2350 12/20/20 0444  Weight: 64.4 kg 64.4 kg    Examination: EOMI Arcus senilis no distress  S1-S2 no murmur no rub no gallop monitor is benign CTA B no added sound no rales no rhonchi mild lower extremity edema Neurologically intact no focal deficit Has various cysts on right upper extremity and forearm and on finger   Data Reviewed: I have personally reviewed following  labs and imaging studies  Potassium 4.2-->3.3 BUN/creatinine 27/1.4-->1.2 White count 7.4 hemoglobin 10.4 platelet 245  COVID-19 Labs  No results for input(s): DDIMER, FERRITIN, LDH, CRP in the last 72 hours.  Lab Results  Component Value Date   SARSCOV2NAA NEGATIVE 12/19/2020     Radiology Studies: DG Abdomen Acute W/Chest  Result Date: 12/19/2020 CLINICAL DATA:  Shortness of breath and constipation EXAM: DG ABDOMEN ACUTE WITH 1 VIEW CHEST COMPARISON:  12/02/2020 FINDINGS: Cardiac shadow is mildly enlarged. Aortic calcifications are seen. Patchy airspace opacity is noted although significantly improved when compared with prior exam. Scattered large and small bowel gas is noted. No free air is seen. No abnormal mass or abnormal calcifications are noted. Mild  retained fecal material consistent with constipation is seen although no obstructive changes noted. No bony abnormality is seen. IMPRESSION: Improved aeration in the lungs bilaterally. Changes of mild constipation. Electronically Signed   By: Alcide Clever M.D.   On: 12/19/2020 00:26     Scheduled Meds: . [START ON 12/21/2020] aspirin EC  81 mg Oral Q M,W,F  . docusate sodium  100 mg Oral Daily  . enoxaparin (LOVENOX) injection  40 mg Subcutaneous Q24H  . furosemide  20 mg Intravenous BID  . insulin aspart  0-9 Units Subcutaneous TID WC  . metoprolol tartrate  25 mg Oral BID  . sodium chloride flush  3 mL Intravenous Q12H  . Vitamin D (Ergocalciferol)  50,000 Units Oral Weekly   Continuous Infusions: . sodium chloride       LOS: 0 days    Time spent: 54  Rhetta Mura, MD [facility] To contact the attending provider between 7A-7P or the covering provider during after hours 7P-7A, please log into the web site [email or web address] and access using universal [place] password for that web site. If you do not have the password, please call the hospital operator.  12/20/2020, 7:29 AM

## 2020-12-21 DIAGNOSIS — N179 Acute kidney failure, unspecified: Secondary | ICD-10-CM | POA: Diagnosis not present

## 2020-12-21 DIAGNOSIS — E11319 Type 2 diabetes mellitus with unspecified diabetic retinopathy without macular edema: Secondary | ICD-10-CM | POA: Diagnosis not present

## 2020-12-21 DIAGNOSIS — R778 Other specified abnormalities of plasma proteins: Secondary | ICD-10-CM

## 2020-12-21 DIAGNOSIS — I5041 Acute combined systolic (congestive) and diastolic (congestive) heart failure: Secondary | ICD-10-CM

## 2020-12-21 DIAGNOSIS — I5021 Acute systolic (congestive) heart failure: Secondary | ICD-10-CM

## 2020-12-21 DIAGNOSIS — R7989 Other specified abnormal findings of blood chemistry: Secondary | ICD-10-CM

## 2020-12-21 LAB — CBC WITH DIFFERENTIAL/PLATELET
Abs Immature Granulocytes: 0.03 10*3/uL (ref 0.00–0.07)
Basophils Absolute: 0.1 10*3/uL (ref 0.0–0.1)
Basophils Relative: 1 %
Eosinophils Absolute: 0 10*3/uL (ref 0.0–0.5)
Eosinophils Relative: 1 %
HCT: 37.1 % (ref 36.0–46.0)
Hemoglobin: 11 g/dL — ABNORMAL LOW (ref 12.0–15.0)
Immature Granulocytes: 0 %
Lymphocytes Relative: 19 %
Lymphs Abs: 1.3 10*3/uL (ref 0.7–4.0)
MCH: 24.5 pg — ABNORMAL LOW (ref 26.0–34.0)
MCHC: 29.6 g/dL — ABNORMAL LOW (ref 30.0–36.0)
MCV: 82.6 fL (ref 80.0–100.0)
Monocytes Absolute: 0.6 10*3/uL (ref 0.1–1.0)
Monocytes Relative: 9 %
Neutro Abs: 4.8 10*3/uL (ref 1.7–7.7)
Neutrophils Relative %: 70 %
Platelets: 311 10*3/uL (ref 150–400)
RBC: 4.49 MIL/uL (ref 3.87–5.11)
RDW: 18.7 % — ABNORMAL HIGH (ref 11.5–15.5)
WBC: 6.8 10*3/uL (ref 4.0–10.5)
nRBC: 0 % (ref 0.0–0.2)

## 2020-12-21 LAB — COMPREHENSIVE METABOLIC PANEL
ALT: 31 U/L (ref 0–44)
AST: 30 U/L (ref 15–41)
Albumin: 3.1 g/dL — ABNORMAL LOW (ref 3.5–5.0)
Alkaline Phosphatase: 119 U/L (ref 38–126)
Anion gap: 12 (ref 5–15)
BUN: 26 mg/dL — ABNORMAL HIGH (ref 8–23)
CO2: 25 mmol/L (ref 22–32)
Calcium: 8.9 mg/dL (ref 8.9–10.3)
Chloride: 103 mmol/L (ref 98–111)
Creatinine, Ser: 1.46 mg/dL — ABNORMAL HIGH (ref 0.44–1.00)
GFR, Estimated: 40 mL/min — ABNORMAL LOW (ref >60)
Glucose, Bld: 56 mg/dL — ABNORMAL LOW (ref 70–99)
Potassium: 3.8 mmol/L (ref 3.5–5.1)
Sodium: 140 mmol/L (ref 135–145)
Total Bilirubin: 1.8 mg/dL — ABNORMAL HIGH (ref 0.3–1.2)
Total Protein: 7.5 g/dL (ref 6.5–8.1)

## 2020-12-21 LAB — GLUCOSE, CAPILLARY
Glucose-Capillary: 105 mg/dL — ABNORMAL HIGH (ref 70–99)
Glucose-Capillary: 133 mg/dL — ABNORMAL HIGH (ref 70–99)
Glucose-Capillary: 71 mg/dL (ref 70–99)
Glucose-Capillary: 140 mg/dL — ABNORMAL HIGH (ref 70–99)

## 2020-12-21 LAB — TSH: TSH: 1.514 u[IU]/mL (ref 0.350–4.500)

## 2020-12-21 MED ORDER — INSULIN GLARGINE 100 UNIT/ML ~~LOC~~ SOLN
8.0000 [IU] | Freq: Every day | SUBCUTANEOUS | Status: DC
  Administered 2020-12-22: 8 [IU] via SUBCUTANEOUS
  Filled 2020-12-21 (×2): qty 0.08

## 2020-12-21 MED ORDER — FUROSEMIDE 10 MG/ML IJ SOLN
40.0000 mg | Freq: Two times a day (BID) | INTRAMUSCULAR | Status: DC
  Administered 2020-12-21 – 2020-12-22 (×2): 40 mg via INTRAVENOUS
  Filled 2020-12-21 (×2): qty 4

## 2020-12-21 NOTE — Consult Note (Addendum)
Cardiology Consultation:   Patient ID: KEVA DARTY MRN: [id number]; DOB: 22-Aug-1956  Admit date: 12/18/2020 Date of Consult: 12/21/2020  Primary Care Provider: Raymon Mutton., FNP [facility] Cardiologist: Chilton Si, MD new [facility] Electrophysiologist:  None    Patient Profile:   Maria Foster is a 65 y.o. female with a hx of chronic diastolic heart failure, DM with bilateral diabetic retinopathy, and HTN who is being seen today for the evaluation of new CHF at the request of Dr. Irene Limbo.  History of Present Illness:   Ms. Profeta does not currently follow with a cardiologist. Pt presented with weakness and constipation found to be hypertensive. She also reported shortness of breath that was positional. She does not take BP medications at home.   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   She was started on 20 mg IV lasix BID and 25 mg metoprolol. 1.7 L urine output was recorded yesterday. Cardiology was consulted for new systolic heart failure.   She has struggled with constipation in the past. She reports feeling "weaker" over the past two weeks. When questioned further, she describes dyspnea on exertion, lower extremity swelling, abdominal fullness, and palpitations. She started getting constipated Jan 14 and visited her PCP. She has been increasing her fiber and water intake. Over the last two weeks, she started having palpitations 2-3 times per week that lasted a few minutes. Over the last week, she reports swelling in both feet, orthopnea, and abdominal fullness that she attributed to increased water intake for constipation. She feels her breathing is improved after lasix. She denies chest pain and  prior cardiac history.   She is married and has two children, 5 grandchildren, and 1 great grandchild. She lives at home with her husband. She cooks and rarely eats out. She has a family history of heart disease in her mother and 2 sisters. She has not traveled outside of the [place].     Past Medical History:  Diagnosis Date  . Arthritis   . Cataract   . Diabetes mellitus without complication (HCC)   . Hypertension     Past Surgical History:  Procedure Laterality Date  . PRP Bilateral OD - 07/23/2017 OS - 08/06/2017     Home Medications:  Prior to Admission medications   Medication Sig Start Date End Date Taking? Authorizing Provider  amLODipine (NORVASC) 10 MG tablet Take 1 tablet (10 mg total) by mouth daily. 11/29/16  Yes Earley Favor, NP  aspirin EC 81 MG tablet Take 81 mg by mouth every Monday, Wednesday, and Friday.   Yes [provider]  docusate sodium (COLACE) 100 MG capsule Take 100 mg by mouth daily. 12/12/20  Yes [provider]  naproxen sodium (ANAPROX) 220 MG tablet Take 220 mg by mouth daily as needed (pain).   Yes [provider]  Vitamin D, Ergocalciferol, (DRISDOL) 1.25 MG (50000 UNIT) CAPS capsule Take 50,000 Units by mouth once a week. 12/09/20  Yes [provider]  diphenhydrAMINE (BENADRYL) 25 MG tablet Take 0.5 tablets (12.5 mg total) by mouth every 6 (six) hours as needed (take with Reglan). Patient not taking: No sig reported 11/29/16   Earley Favor, NP  metoCLOPramide [facility])  10 MG tablet Take 0.5 tablets (5 mg total) by mouth every 6 (six) hours as needed for nausea. 11/29/16   Earley Favor, NP  prednisoLONE acetate (PRED FORTE) 1 % ophthalmic suspension Place 1 drop into the right eye 4 (four) times daily. 07/23/17   Rennis Chris, MD    Inpatient Medications: Scheduled Meds: . aspirin EC  81 mg Oral Q M,W,F  . docusate sodium  100 mg Oral Daily  . enoxaparin (LOVENOX) injection  40 mg Subcutaneous Q24H  . furosemide  20 mg  Intravenous BID  . insulin aspart  0-9 Units Subcutaneous TID WC  . insulin glargine  12 Units Subcutaneous Daily  . metFORMIN  850 mg Oral BID WC  . metoprolol tartrate  25 mg Oral BID  . potassium chloride  40 mEq Oral Daily  . sodium chloride flush  3 mL Intravenous Q12H  . Vitamin D (Ergocalciferol)  50,000 Units Oral Weekly   Continuous Infusions: . sodium chloride     PRN Meds: sodium chloride, acetaminophen, hydrALAZINE, ondansetron (ZOFRAN) IV, sodium chloride flush  Allergies:   No Known Allergies  Social History:   Social History   Socioeconomic History  . Marital status: Married    Spouse name: Not on file  . Number of children: Not on file  . Years of education: Not on file  . Highest education level: Not on file  Occupational History  . Not on file  Tobacco Use  . Smoking status: Current Every Day Smoker    Packs/day: 0.50  . Smokeless tobacco: Never Used  Substance and Sexual Activity  . Alcohol use: Yes    Comment: occ  . Drug use: No  . Sexual activity: Not on file  Other Topics Concern  . Not on file  Social History Narrative  . Not on file   Social Determinants of Health   Financial Resource Strain: Not on file  Food Insecurity: Not on file  Transportation Needs: Not on file  Physical Activity: Not on file  Stress: Not on file  Social Connections: Not on file  Intimate Partner Violence: Not on file    Family History:    Family History  Problem Relation Age of Onset  . Amblyopia Neg Hx   . Blindness Neg Hx   . Cataracts Neg Hx   . Glaucoma Neg Hx   . Macular degeneration Neg Hx   . Retinal detachment Neg Hx   . Strabismus Neg Hx   . Retinitis pigmentosa Neg Hx      ROS:  Please see the history of present illness.   All other ROS reviewed and negative.     Physical Exam/Data:   Vitals:   12/20/20 2046 12/21/20 0533 12/21/20 0700 12/21/20 0840  BP: 133/82 (!) 159/91  131/83  Pulse: 73 70    Resp: 18 20    Temp: 98.2 F (36.8  C) 97.6 F (36.4 C)    TempSrc: Oral Oral    SpO2: 100% 100%    Weight:   64.5 kg   Height:        Intake/Output Summary (Last 24 hours) at 12/21/2020 1026 Last data filed at 12/21/2020 0700 Gross per 24 hour  Intake 450 ml  Output 50 ml  Net 400 ml   Last 3 Weights 12/21/2020 12/20/2020 12/19/2020  Weight (lbs) 142 lb 3.2 oz 141 lb 15.6 oz 141 lb 15.6 oz  Weight (kg) 64.5 kg 64.4 kg 64.4 kg     Body mass index  is 25.19 kg/m.  General:  Well nourished, well developed, in no acute distress HEENT: normal Neck: + JVD Vascular: No carotid bruits Cardiac:  normal S1, S2; RRR; + murmur Lungs:  clear to auscultation bilaterally, no wheezing, rhonchi or rales  Abd: soft, nontender, no hepatomegaly  Ext: no edema Musculoskeletal:  No deformities, BUE and BLE strength normal and equal Skin: warm and dry  Neuro:  CNs 2-12 intact, no focal abnormalities noted Psych:  Normal affect   EKG:  The EKG was personally reviewed and demonstrates:  Sinus rhythm with HR 96, LVH Telemetry:  Telemetry was personally reviewed and demonstrates:  Sinus rhythm in the 60-70s  Relevant CV Studies:  Echo 12/20/20: 1. Left ventricular ejection fraction, by estimation, is 35 to 40%. The  left ventricle has moderately decreased function. The left ventricle  demonstrates global hypokinesis. Left ventricular diastolic parameters are  consistent with Grade II diastolic  dysfunction (pseudonormalization). Elevated left atrial pressure. There is  the interventricular septum is flattened in diastole ('D' shaped left  ventricle), consistent with right ventricular volume overload.  2. Right ventricular systolic function is mildly reduced. The right  ventricular size is mildly enlarged. There is moderately elevated  pulmonary artery systolic pressure.  3. Left atrial size was moderately dilated.  4. 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  5. Tricuspid valve regurgitation is moderate to  severe.  6. The aortic valve is tricuspid. Aortic valve regurgitation is mild to  moderate. No aortic stenosis is present.  7. The inferior vena cava is dilated in size with <50% respiratory  variability, suggesting right atrial pressure of 15 mmHg.   Laboratory Data:  High Sensitivity Troponin:   Recent Labs  Lab 12/19/20 0216 12/19/20 0522  TROPONINIHS 45* 47*     Chemistry Recent Labs  Lab 12/19/20 0030 12/20/20 0551 12/21/20 0608  NA 136 136 140  K 4.2 3.3* 3.8  CL 103 103 103  CO2 21* 23 25  GLUCOSE 280* 284* 56*  BUN 27* 23 26*  CREATININE 1.47* 1.23* 1.46*  CALCIUM 8.7* 8.5* 8.9  GFRNONAA 40* 49* 40*  ANIONGAP Recent Labs  Lab 12/19/20 0030 12/21/20 0608  PROT 7.5 7.5  ALBUMIN 3.3* 3.1*  AST 35 30  ALT 32 31  ALKPHOS 122 119  BILITOT 1.5* 1.8*   Hematology Recent Labs  Lab 12/19/20 0030 12/20/20 0551 12/21/20 0608  WBC 6.6 7.4 6.8  RBC 4.51 4.26 4.49  HGB 11.0* 10.4* 11.0*  HCT 36.2 33.7* 37.1  MCV 80.3 79.1* 82.6  MCH 24.4* 24.4* 24.5*  MCHC 30.4 30.9 29.6*  RDW 18.3* 18.1* 18.7*  PLT 310 245 311   BNP Recent Labs  Lab 12/19/20 0030  BNP 3,073.4*    DDimer No results for input(s): DDIMER in the last 168 hours.   Radiology/Studies:  DG Abdomen Acute W/Chest  Result Date: 12/19/2020 CLINICAL DATA:  Shortness of breath and constipation EXAM: DG ABDOMEN ACUTE WITH 1 VIEW CHEST COMPARISON:  12/02/2020 FINDINGS: Cardiac shadow is mildly enlarged. Aortic calcifications are seen. Patchy airspace opacity is noted although significantly improved when compared with prior exam. Scattered large and small bowel gas is noted. No free air is seen. No abnormal mass or abnormal calcifications are noted. Mild retained fecal material consistent with constipation is seen although no obstructive changes noted. No bony abnormality is seen. IMPRESSION: Improved aeration in the lungs bilaterally. Changes of mild constipation. Electronically Signed    By: Loraine Leriche  Lukens M.D.   On: 12/19/2020 00:26   ECHOCARDIOGRAM COMPLETE  Result Date: 12/20/2020    ECHOCARDIOGRAM REPORT   Patient Name:   ANGELEEN HORNEY Date of Exam: 12/20/2020 Medical Rec #:  [id number]       Height:       63.0 in Accession #:    [id number]      Weight:       142.0 lb Date of Birth:  03/31/1956       BSA:          1.672 m Patient Age:    64 years        BP:           143/88 mmHg Patient Gender: F               HR:           75 bpm. Exam Location:  Inpatient Procedure: 2D Echo, Cardiac Doppler and Color Doppler Indications:    CHF  History:        Patient has no prior history of Echocardiogram examinations.                 CHF, Signs/Symptoms:Shortness of Breath; Risk Factors:Diabetes,                 LE edema and Hypertension.  Sonographer:    Lavenia Atlas Referring Phys: [id number] MOHAMMAD L GARBA IMPRESSIONS  1. Left ventricular ejection fraction, by estimation, is 35 to 40%. The left ventricle has moderately decreased function. The left ventricle demonstrates global hypokinesis. Left ventricular diastolic parameters are consistent with Grade II diastolic dysfunction (pseudonormalization). Elevated left atrial pressure. There is the interventricular septum is flattened in diastole ('D' shaped left ventricle), consistent with right ventricular volume overload.  2. Right ventricular systolic function is mildly reduced. The right ventricular size is mildly enlarged. There is moderately elevated pulmonary artery systolic pressure.  3. Left atrial size was moderately dilated.  4. 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  5. Tricuspid valve regurgitation is moderate to severe.  6. The aortic valve is tricuspid. Aortic valve regurgitation is mild to moderate. No aortic stenosis is present.  7. The inferior vena cava is dilated in size with <50% respiratory variability, suggesting right atrial pressure of 15 mmHg. FINDINGS  Left Ventricle: Left ventricular ejection fraction,  by estimation, is 35 to 40%. The left ventricle has moderately decreased function. The left ventricle demonstrates global hypokinesis. The left ventricular internal cavity size was normal in size. There is borderline concentric left ventricular hypertrophy. The interventricular septum is flattened in diastole ('D' shaped left ventricle), consistent with right ventricular volume overload. Left ventricular diastolic parameters are consistent with Grade II diastolic dysfunction (pseudonormalization). Elevated left atrial pressure. Right Ventricle: The right ventricular size is mildly enlarged. No increase in right ventricular wall thickness. Right ventricular systolic function is mildly reduced. There is moderately elevated pulmonary artery systolic pressure. The tricuspid regurgitant velocity is 3.12 m/s, and with an assumed right atrial pressure of 15 mmHg, the estimated right ventricular systolic pressure is 53.9 mmHg. Left Atrium: Left atrial size was moderately dilated. Right Atrium: Right atrial size was normal in size. Pericardium: There is no evidence of pericardial effusion. Mitral Valve: The mitral valve is rheumatic. There is mild thickening of the mitral valve leaflet(s). Moderate mitral valve regurgitation, with centrally-directed jet. Tricuspid Valve: The tricuspid valve is normal in structure. Tricuspid valve regurgitation is moderate to severe. Aortic Valve: The aortic valve is  tricuspid. Aortic valve regurgitation is mild to moderate. Aortic regurgitation PHT measures 391 msec. No aortic stenosis is present. Pulmonic Valve: The pulmonic valve was normal in structure. Pulmonic valve regurgitation is mild to moderate. Aorta: The aortic root and ascending aorta are structurally normal, with no evidence of dilitation. Venous: The inferior vena cava is dilated in size with less than 50% respiratory variability, suggesting right atrial pressure of 15 mmHg. IAS/Shunts: No atrial level shunt detected by color  flow Doppler.  LEFT VENTRICLE PLAX 2D LVIDd:         4.80 cm  Diastology LVIDs:         3.70 cm  LV e' medial:    4.24 cm/s LV PW:         1.10 cm  LV E/e' medial:  31.4 LV IVS:        1.20 cm  LV e' lateral:   6.53 cm/s LVOT diam:     1.90 cm  LV E/e' lateral: 20.4 LV SV:         43 LV SV Index:   25 LVOT Area:     2.84 cm  RIGHT VENTRICLE RV Basal diam:  3.50 cm RV S prime:     8.92 cm/s TAPSE (M-mode): 2.3 cm LEFT ATRIUM             Index       RIGHT ATRIUM           Index LA diam:        3.70 cm 2.21 cm/m  RA Area:     18.30 cm LA Vol (A2C):   69.7 ml 41.70 ml/m RA Volume:   54.40 ml  32.54 ml/m LA Vol (A4C):   76.7 ml 45.88 ml/m LA Biplane Vol: 76.5 ml 45.76 ml/m  AORTIC VALVE LVOT Vmax:   86.70 cm/s LVOT Vmean:  47.500 cm/s LVOT VTI:    0.150 m AI PHT:      391 msec  AORTA Ao Root diam: 2.60 cm MITRAL VALVE                TRICUSPID VALVE MV Area (PHT): 4.21 cm     TR Peak grad:   38.9 mmHg MV Decel Time: 180 msec     TR Vmax:        312.00 cm/s MV E velocity: 133.00 cm/s MV A velocity: 67.80 cm/s   SHUNTS MV E/A ratio:  1.96         Systemic VTI:  0.15 m                             Systemic Diam: 1.90 cm Mihai Croitoru MD Electronically signed by Thurmon Fair MD Signature Date/Time: 12/20/2020/3:22:00 PM    Final      Assessment and Plan:   Acute systolic and diastolic heart failure - new diagnosis Mild RV dysfunction - BNP > 3000 - echo with EF 35-40%, grade 2 DD, moderately elevated PA pressure - diuresing on 20 mg IV lasix BID with only modest diuresis - no reduction in weight - appears hypervolemic on exam - consider increasing lasix - 40 mg IV BID - discussed low salt diet - consider ischemic evaluation once more euvolemic and renal function has normalized, may need NST - mild troponin elevation consistent with demand ischemia in the setting of CHF   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 - review echo with attending -  will need to monitor MR with echo in  6-12 months   Hypertension Hypertensive urgency - required IV hydralazine x 3 - now on 25 mg lopressor BID  - consider bidil if pressure still elevated - given DM, would ideally start on ARB/ARNI +/- spiro when renal function improves - follow pressures   Palpitations - she reports 1 month of palpitations, no syncope - no ectopy or arrhythmia on telemetry, although she does report brief palpitations since admission - consider zio patch at discharge - will check a TSH give palpitations and constipation   DM - complications with retinopathy  - A1c 8.1% - insulin and metformin per primary    Risk Assessment/Risk Scores:   New York Heart Association (NYHA) Functional Class NYHA Class III-IV        For questions or updates, please contact [facility] Please consult [email or web address] for contact info under    Signed, Marcelino Duster, PA  12/21/2020 10:26 AM

## 2020-12-21 NOTE — Hospital Course (Addendum)
64yow PMH diabetes not on medication, hypertension presented with constipation, exertional dyspnea, admitted for presumed new onset CHF.  Echo revealed new systolic dysfunction.  A & P   Acute systolic/diastolic CHF, new dx; moderate to severe TR. Echo Echo LVEF 35-40%, global hypokinesis, grade II diastolic dysfunction, mod pulm HTN, mod MR, moderate to sever TR --Clinically improved.  Weight down 4 kg.  Renal function has bumped.  Stop diuretic. --Continue metoprolol.  BiDil added by cardiology. --Heart cath tentatively planned for tomorrow --Follow-up in 3 months with cardiology to reassess valves.  Acute kidney injury presumed, last creatinine on file 2018 was 0.95. --Creatinine slightly up with diuresis.  Diuretics stopped.  Repeat BMP in a.m.  DM type 2 w/ bilateral retinopathy, vitreous hemorrhage on right. Hgb A1c 8.6. Not on home medications.  Avoid Metformin. --CBG well controlled.  Continue long-acting insulin.  Aggressive diabetic teaching. --Continue prednisolone drops  Essential hypertension not on any medications --Continue metoprolol.  Now changed to succinate.  Acute on chronic constipation --Aggressive bowel regimen

## 2020-12-21 NOTE — Plan of Care (Signed)
  Problem: Activity: Goal: Capacity to carry out activities will improve Outcome: Progressing   Problem: Cardiac: Goal: Ability to achieve and maintain adequate cardiopulmonary perfusion will improve Outcome: Progressing   Problem: Health Behavior/Discharge Planning: Goal: Ability to manage health-related needs will improve Outcome: Progressing   Problem: Activity: Goal: Risk for activity intolerance will decrease Outcome: Progressing

## 2020-12-21 NOTE — Progress Notes (Signed)
PROGRESS NOTE  Maria Foster YQM:[id number] DOB: [birth year] DOA: 12/18/2020 PCP: Raymon Mutton., FNP  Brief History   64yow PMH diabetes not on medication, hypertension presented with constipation, exertional dyspnea, admitted for presumed new onset CHF. A & P   Acute systolic/diastolic CHF, new dx; moderate to severe TR. Echo Echo LVEF 35-40%, global hypokinesis, grade II diastolic dysfunction, mod pulm HTN, mod MR, moderate to sever TR --Continue diuresis.  Subjectively improved. --Heart cath planned when more euvolemic and renal function improved. --Follow-up in 3 months with cardiology to reassess valves.  Acute kidney injury presumed, last creatinine on file 2018 was 0.95. --Diuresis may improve renal function.  Continue daily BMP.  DM type 2 w/ bilateral retinopathy, vitreous hemorrhage on right. Hgb A1c 8.6. Hemoglobin A1c 8.6.  Not on home medications.  Avoid Metformin. --Blood sugars now well controlled with fasting blood sugar 105 and afternoon blood sugar was 71.  Decrease Lantus.  Aggressive diabetic teaching. --Continue prednisolone drops  Essential hypertension not on any medications --Continue metoprolol.  Acute on chronic constipation --Aggressive of bowel regimen  Nutritional Assessment: Body mass index is 25.19 kg/m.Marland Kitchen Seen by dietician.  I agree with the assessment and plan as outlined below: Nutrition Status: Nutrition Problem: Increased nutrient needs Etiology: acute illness Signs/Symptoms: estimated needs Interventions: Refer to RD note for recommendations  Disposition Plan:  Discussion: As above  Status is: Inpatient  Remains inpatient appropriate because:IV treatments appropriate due to intensity of illness or inability to take PO and Inpatient level of care appropriate due to severity of illness   Dispo: The patient is from: Home              Anticipated d/c is to: Home              Anticipated d/c date is: 3 days              Patient  currently is not medically stable to d/c.   Difficult to place patient No DVT prophylaxis: enoxaparin (LOVENOX) injection 40 mg Start: 12/20/20 1000   Code Status: Full Code Level of care: Telemetry Family Communication: none  Brendia Sacks, MD  [facility] Direct contact: see [email or web address] (further directions at bottom of note if needed) 7PM-7AM contact night coverage as at bottom of note 12/21/2020, 5:13 PM  LOS: 1 day   Significant Hospital Events   .    Consults:  . Cardiology    Procedures:  .   Significant Diagnostic Tests:  . Echo LVEF 35-40%, global hypokinesis, grade II diastolic dysfunction, mod pulm HTN, mod MR, moderate to sever TR   Micro Data:  .    Antimicrobials:  .   Interval History/Subjective  CC: f/u SOB  Feels better, SOB at times. Eating well. No n/v. No swelling. No PMH of heart problems, MI or CHF.  Objective   Vitals:  Vitals:   12/21/20 0840 12/21/20 1300  BP: 131/83 128/82  Pulse:  67  Resp:    Temp:  98.2 F (36.8 C)  SpO2:  99%    Exam:  Constitutional:   . Appears calm and comfortable in bed eating breakfast ENMT:  . grossly normal hearing  . Lips appear normal . Tongue appears normal Respiratory:  . CTA bilaterally, no w/r/r.  . Respiratory effort normal. Cardiovascular:  . RRR, no m/r/g . No LE extremity edema   . Telemetry SR Musculoskeletal:  . Digits/nails BUE: cyanosis, petechiae, infection; MCP distortion bilaterally, most prominent in first bilaterally Psychiatric:  .  Mental status o Mood, affect appropriate  I have personally reviewed the following:   Today's Data  . Creatinine stable 1.46 . T bili 1.8 . Hgb stable 11.0  Scheduled Meds: . aspirin EC  81 mg Oral Q M,W,F  . docusate sodium  100 mg Oral Daily  . enoxaparin (LOVENOX) injection  40 mg Subcutaneous Q24H  . furosemide  40 mg Intravenous BID  . insulin aspart  0-9 Units Subcutaneous TID WC  . [START ON 12/22/2020] insulin glargine  8  Units Subcutaneous Daily  . metoprolol tartrate  25 mg Oral BID  . potassium chloride  40 mEq Oral Daily  . sodium chloride flush  3 mL Intravenous Q12H  . Vitamin D (Ergocalciferol)  50,000 Units Oral Weekly   Continuous Infusions: . sodium chloride      Principal Problem:   Acute combined systolic and diastolic heart failure (HCC) Active Problems:   Benign essential HTN   Diabetes (HCC)   Constipation   AKI (acute kidney injury) (HCC)   Elevated troponin   LOS: 1 day   How to contact the [facility] Attending or Consulting provider 7A - 7P or covering provider during after hours 7P -7A, for this patient?  1. Check the care team in [facility] and look for a) attending/consulting TR[facility] provider listed and b) the [facility] team listed 2. Log into [email or web address] and use [place]'s universal password to access. If you do not have the password, please contact the hospital operator. 3. Locate the [facility] provider you are looking for under [facility] and page to a number that you can be directly reached. 4. If you still have difficulty reaching the provider, please page the [facility] (Director on Call) for the Hospitalists listed on amion for assistance.

## 2020-12-21 NOTE — Plan of Care (Signed)
  Problem: Activity: Goal: Capacity to carry out activities will improve Outcome: Progressing   Problem: Cardiac: Goal: Ability to achieve and maintain adequate cardiopulmonary perfusion will improve Outcome: Progressing   Problem: Activity: Goal: Risk for activity intolerance will decrease Outcome: Progressing   Problem: Education: Goal: Knowledge of General Education information will improve Description: Including pain rating scale, medication(s)/side effects and non-pharmacologic comfort measures Outcome: Completed/Met

## 2020-12-22 DIAGNOSIS — E11319 Type 2 diabetes mellitus with unspecified diabetic retinopathy without macular edema: Secondary | ICD-10-CM | POA: Diagnosis not present

## 2020-12-22 DIAGNOSIS — N179 Acute kidney failure, unspecified: Secondary | ICD-10-CM | POA: Diagnosis not present

## 2020-12-22 DIAGNOSIS — I5041 Acute combined systolic (congestive) and diastolic (congestive) heart failure: Secondary | ICD-10-CM | POA: Diagnosis not present

## 2020-12-22 LAB — BASIC METABOLIC PANEL
Anion gap: 14 (ref 5–15)
BUN: 33 mg/dL — ABNORMAL HIGH (ref 8–23)
CO2: 22 mmol/L (ref 22–32)
Calcium: 8.6 mg/dL — ABNORMAL LOW (ref 8.9–10.3)
Chloride: 101 mmol/L (ref 98–111)
Creatinine, Ser: 1.64 mg/dL — ABNORMAL HIGH (ref 0.44–1.00)
GFR, Estimated: 35 mL/min — ABNORMAL LOW (ref >60)
Glucose, Bld: 87 mg/dL (ref 70–99)
Potassium: 4 mmol/L (ref 3.5–5.1)
Sodium: 137 mmol/L (ref 135–145)

## 2020-12-22 LAB — GLUCOSE, CAPILLARY
Glucose-Capillary: 78 mg/dL (ref 70–99)
Glucose-Capillary: 180 mg/dL — ABNORMAL HIGH (ref 70–99)
Glucose-Capillary: 137 mg/dL — ABNORMAL HIGH (ref 70–99)
Glucose-Capillary: 164 mg/dL — ABNORMAL HIGH (ref 70–99)

## 2020-12-22 MED ORDER — SENNA 8.6 MG PO TABS
1.0000 | ORAL_TABLET | Freq: Every day | ORAL | Status: DC
  Administered 2020-12-22: 8.6 mg via ORAL
  Filled 2020-12-22: qty 1

## 2020-12-22 MED ORDER — SODIUM CHLORIDE 0.9 % IV SOLN: INTRAVENOUS | Status: DC

## 2020-12-22 MED ORDER — ASPIRIN 81 MG PO CHEW
81.0000 mg | CHEWABLE_TABLET | ORAL | Status: AC
  Administered 2020-12-23: 81 mg via ORAL
  Filled 2020-12-22: qty 1

## 2020-12-22 MED ORDER — PREDNISOLONE ACETATE 1 % OP SUSP
1.0000 [drp] | Freq: Four times a day (QID) | OPHTHALMIC | Status: DC
  Filled 2020-12-22: qty 5

## 2020-12-22 MED ORDER — SODIUM CHLORIDE 0.9% FLUSH
3.0000 mL | Freq: Two times a day (BID) | INTRAVENOUS | Status: DC
  Administered 2020-12-22 – 2020-12-23 (×2): 3 mL via INTRAVENOUS

## 2020-12-22 MED ORDER — ENOXAPARIN SODIUM 30 MG/0.3ML ~~LOC~~ SOLN
30.0000 mg | SUBCUTANEOUS | Status: DC
  Administered 2020-12-22 – 2020-12-23 (×2): 30 mg via SUBCUTANEOUS
  Filled 2020-12-22 (×2): qty 0.3

## 2020-12-22 MED ORDER — POLYETHYLENE GLYCOL 3350 17 G PO PACK
17.0000 g | PACK | Freq: Two times a day (BID) | ORAL | Status: DC
  Administered 2020-12-22 – 2020-12-23 (×2): 17 g via ORAL
  Filled 2020-12-22 (×2): qty 1

## 2020-12-22 MED ORDER — SODIUM CHLORIDE 0.9% FLUSH: 3.0000 mL | INTRAVENOUS | Status: DC | PRN

## 2020-12-22 MED ORDER — SODIUM CHLORIDE 0.9 % IV SOLN: 250.0000 mL | INTRAVENOUS | Status: DC | PRN

## 2020-12-22 MED ORDER — ISOSORB DINITRATE-HYDRALAZINE 20-37.5 MG PO TABS
1.0000 | ORAL_TABLET | Freq: Three times a day (TID) | ORAL | Status: DC
  Administered 2020-12-22 (×2): 1 via ORAL
  Filled 2020-12-22 (×4): qty 1

## 2020-12-22 MED ORDER — METOPROLOL SUCCINATE ER 50 MG PO TB24
50.0000 mg | ORAL_TABLET | Freq: Every day | ORAL | Status: DC
  Administered 2020-12-23: 50 mg via ORAL
  Filled 2020-12-22: qty 1

## 2020-12-22 NOTE — Progress Notes (Signed)
Received report from [facility]. Patient alert, resting in bed with respirations even and unlabored, bed low and locked. Call bell within reach. Will continue to monitor.

## 2020-12-22 NOTE — TOC Initial Note (Signed)
Transition of Care [facility]) - Initial/Assessment Note    Patient Details  Name: Maria Foster MRN: [id number] Date of Birth: 29-Sep-1956  Transition of Care [facility]) CM/SW Contact:    Lanier Clam, RN Phone Number: 12/22/2020, 11:51 AM  Clinical Narrative: Provided with pcp resource for [facility]-patient can make own appt-patient voiced understanding.                   Expected Discharge Plan: Home/Self Care Barriers to Discharge: No Barriers Identified   Patient Goals and CMS Choice Patient states their goals for this hospitalization and ongoing recovery are:: go home CMS Medicare.gov Compare Post Acute Care list provided to:: Patient    Expected Discharge Plan and Services Expected Discharge Plan: Home/Self Care   Discharge Planning Services: CM Consult   Living arrangements for the past 2 months: Single Family Home                                      Prior Living Arrangements/Services Living arrangements for the past 2 months: Single Family Home Lives with:: Spouse Patient language and need for interpreter reviewed:: Yes Do you feel safe going back to the place where you live?: Yes      Need for Family Participation in Patient Care: No (Comment) Care giver support system in place?: Yes (comment)   Criminal Activity/Legal Involvement Pertinent to Current Situation/Hospitalization: No - Comment as needed  Activities of Daily Living Home Assistive Devices/Equipment: None ADL Screening (condition at time of admission) Patient's cognitive ability adequate to safely complete daily activities?: No Is the patient deaf or have difficulty hearing?: No Does the patient have difficulty seeing, even when wearing glasses/contacts?: No Does the patient have difficulty concentrating, remembering, or making decisions?: No Patient able to express need for assistance with ADLs?: Yes Does the patient have difficulty dressing or bathing?: Yes Independently performs ADLs?:  No Communication: Appropriate for developmental age Dressing (OT): Needs assistance Is this a change from baseline?: Pre-admission baseline Grooming: Needs assistance Is this a change from baseline?: Pre-admission baseline Feeding: Independent Toileting: Needs assistance Is this a change from baseline?: Pre-admission baseline In/Out Bed: Needs assistance Is this a change from baseline?: Pre-admission baseline Walks in Home: Independent Does the patient have difficulty walking or climbing stairs?: No Weakness of Legs: Right Weakness of Arms/Hands: None  Permission Sought/Granted Permission sought to share information with : Case Manager Permission granted to share information with : Yes, Verbal Permission Granted  Share Information with NAME: Case Management           Emotional Assessment Appearance:: Appears stated age Attitude/Demeanor/Rapport: Gracious Affect (typically observed): Accepting Orientation: : Oriented to Self,Oriented to Place,Oriented to  Time,Oriented to Situation Alcohol / Substance Use: Not Applicable Psych Involvement: No (comment)  Admission diagnosis:  Acute congestive heart failure (HCC) [I50.9] Elevated troponin [R77.8] Hypertensive emergency [I16.1] Acute heart failure, unspecified heart failure type (HCC) [I50.9] Acute decompensated heart failure (HCC) [I50.9] Patient Active Problem List   Diagnosis Date Noted  . Elevated troponin   . Acute combined systolic and diastolic heart failure (HCC)   . Benign essential HTN 12/19/2020  . Diabetes (HCC) 12/19/2020  . Constipation 12/19/2020  . AKI (acute kidney injury) (HCC) 12/19/2020   PCP:  Raymon Mutton., FNP Pharmacy:   [facility] [phone] - [place], [place] - [address] AT [address] [place]  [address] Phone: [phone] Fax: [phone]  [id number] [place],  - [address] AT [facility] [address] [place] [address] Phone: [phone] Fax: [phone]     Social Determinants of Health (SDOH) Interventions    Readmission Risk Interventions No flowsheet data found.

## 2020-12-22 NOTE — Progress Notes (Signed)
Progress Note  Patient Name: Maria Foster Date of Encounter: 12/22/2020  [facility] Cardiologist: Chilton Si, MD   Subjective   She feels much better, much less "weak." I positioned the bed flat and she was comfortable without dyspnea. She is ambulating to the bathroom well, but has not walked in halls yet.  She is down 9 lbs.   Inpatient Medications    Scheduled Meds: . aspirin EC  81 mg Oral Q M,W,F  . docusate sodium  100 mg Oral Daily  . enoxaparin (LOVENOX) injection  30 mg Subcutaneous Q24H  . insulin aspart  0-9 Units Subcutaneous TID WC  . insulin glargine  8 Units Subcutaneous Daily  . metoprolol tartrate  25 mg Oral BID  . potassium chloride  40 mEq Oral Daily  . sodium chloride flush  3 mL Intravenous Q12H  . Vitamin D (Ergocalciferol)  50,000 Units Oral Weekly   Continuous Infusions: . sodium chloride     PRN Meds: sodium chloride, acetaminophen, hydrALAZINE, ondansetron (ZOFRAN) IV, sodium chloride flush   Vital Signs    Vitals:   12/21/20 2155 12/22/20 0507 12/22/20 0800 12/22/20 1019  BP: (!) 147/87 (!) 148/83 132/86 (!) 150/86  Pulse: 71 70 69 70  Resp: 20 18    Temp:  97.9 F (36.6 C) 98 F (36.7 C)   TempSrc: Oral Oral Oral   SpO2: 96% 98% 100% 100%  Weight:  60.3 kg    Height:        Intake/Output Summary (Last 24 hours) at 12/22/2020 1107 Last data filed at 12/22/2020 0936 Gross per 24 hour  Intake 600 ml  Output 1250 ml  Net -650 ml   Last 3 Weights 12/22/2020 12/21/2020 12/20/2020  Weight (lbs) 133 lb 142 lb 3.2 oz 141 lb 15.6 oz  Weight (kg) 60.328 kg 64.5 kg 64.4 kg      Telemetry    Sinus rhythm in the 60s - Personally Reviewed  ECG    No new tracings - Personally Reviewed  Physical Exam   GEN: No acute distress.   Neck: + JVD Cardiac: RRR, no murmurs, rubs, or gallops.  Respiratory: Clear to auscultation bilaterally. GI: Soft, nontender, non-distended  MS: No edema; No deformity. Neuro:  Nonfocal  Psych:  Normal affect   Labs    High Sensitivity Troponin:   Recent Labs  Lab 12/19/20 0216 12/19/20 0522  TROPONINIHS 45* 47*      Chemistry Recent Labs  Lab 12/19/20 0030 12/20/20 0551 12/21/20 0608 12/22/20 0500  NA 136 136 140 137  K 4.2 3.3* 3.8 4.0  CL 103 103 103 101  CO2 21* 23 25 22   GLUCOSE 280* 284* 56* 87  BUN 27* 23 26* 33*  CREATININE 1.47* 1.23* 1.46* 1.64*  CALCIUM 8.7* 8.5* 8.9 8.6*  PROT 7.5  --  7.5  --   ALBUMIN 3.3*  --  3.1*  --   AST 35  --  30  --   ALT 32  --  31  --   ALKPHOS 122  --  119  --   BILITOT 1.5*  --  1.8*  --   GFRNONAA 40* 49* 40* 35*  ANIONGAP 12 10 12 14      Hematology Recent Labs  Lab 12/19/20 0030 12/20/20 0551 12/21/20 0608  WBC 6.6 7.4 6.8  RBC 4.51 4.26 4.49  HGB 11.0* 10.4* 11.0*  HCT 36.2 33.7* 37.1  MCV 80.3 79.1* 82.6  MCH 24.4* 24.4* 24.5*  MCHC  30.4 30.9 29.6*  RDW 18.3* 18.1* 18.7*  PLT 310 245 311    BNP Recent Labs  Lab 12/19/20 0030  BNP 3,073.4*     DDimer No results for input(s): DDIMER in the last 168 hours.   Radiology    ECHOCARDIOGRAM COMPLETE  Result Date: 12/20/2020    ECHOCARDIOGRAM REPORT   Patient Name:   Maria Foster Date of Exam: 12/20/2020 Medical Rec #:  [id number]       Height:       63.0 in Accession #:    [id number]      Weight:       142.0 lb Date of Birth:  04/03/1956       BSA:          1.672 m Patient Age:    64 years        BP:           143/88 mmHg Patient Gender: F               HR:           75 bpm. Exam Location:  Inpatient Procedure: 2D Echo, Cardiac Doppler and Color Doppler Indications:    CHF  History:        Patient has no prior history of Echocardiogram examinations.                 CHF, Signs/Symptoms:Shortness of Breath; Risk Factors:Diabetes,                 LE edema and Hypertension.  Sonographer:    Lavenia Atlas Referring Phys: [id number] MOHAMMAD L GARBA IMPRESSIONS  1. Left ventricular ejection fraction, by estimation, is 35 to 40%. The left ventricle has moderately  decreased function. The left ventricle demonstrates global hypokinesis. Left ventricular diastolic parameters are consistent with Grade II diastolic dysfunction (pseudonormalization). Elevated left atrial pressure. There is the interventricular septum is flattened in diastole ('D' shaped left ventricle), consistent with right ventricular volume overload.  2. Right ventricular systolic function is mildly reduced. The right ventricular size is mildly enlarged. There is moderately elevated pulmonary artery systolic pressure.  3. Left atrial size was moderately dilated.  4. 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  5. Tricuspid valve regurgitation is moderate to severe.  6. The aortic valve is tricuspid. Aortic valve regurgitation is mild to moderate. No aortic stenosis is present.  7. The inferior vena cava is dilated in size with <50% respiratory variability, suggesting right atrial pressure of 15 mmHg. FINDINGS  Left Ventricle: Left ventricular ejection fraction, by estimation, is 35 to 40%. The left ventricle has moderately decreased function. The left ventricle demonstrates global hypokinesis. The left ventricular internal cavity size was normal in size. There is borderline concentric left ventricular hypertrophy. The interventricular septum is flattened in diastole ('D' shaped left ventricle), consistent with right ventricular volume overload. Left ventricular diastolic parameters are consistent with Grade II diastolic dysfunction (pseudonormalization). Elevated left atrial pressure. Right Ventricle: The right ventricular size is mildly enlarged. No increase in right ventricular wall thickness. Right ventricular systolic function is mildly reduced. There is moderately elevated pulmonary artery systolic pressure. The tricuspid regurgitant velocity is 3.12 m/s, and with an assumed right atrial pressure of 15 mmHg, the estimated right ventricular systolic pressure is 53.9 mmHg. Left Atrium:  Left atrial size was moderately dilated. Right Atrium: Right atrial size was normal in size. Pericardium: There is no evidence of pericardial effusion. Mitral Valve: The mitral valve is  rheumatic. There is mild thickening of the mitral valve leaflet(s). Moderate mitral valve regurgitation, with centrally-directed jet. Tricuspid Valve: The tricuspid valve is normal in structure. Tricuspid valve regurgitation is moderate to severe. Aortic Valve: The aortic valve is tricuspid. Aortic valve regurgitation is mild to moderate. Aortic regurgitation PHT measures 391 msec. No aortic stenosis is present. Pulmonic Valve: The pulmonic valve was normal in structure. Pulmonic valve regurgitation is mild to moderate. Aorta: The aortic root and ascending aorta are structurally normal, with no evidence of dilitation. Venous: The inferior vena cava is dilated in size with less than 50% respiratory variability, suggesting right atrial pressure of 15 mmHg. IAS/Shunts: No atrial level shunt detected by color flow Doppler.  LEFT VENTRICLE PLAX 2D LVIDd:         4.80 cm  Diastology LVIDs:         3.70 cm  LV e' medial:    4.24 cm/s LV PW:         1.10 cm  LV E/e' medial:  31.4 LV IVS:        1.20 cm  LV e' lateral:   6.53 cm/s LVOT diam:     1.90 cm  LV E/e' lateral: 20.4 LV SV:         43 LV SV Index:   25 LVOT Area:     2.84 cm  RIGHT VENTRICLE RV Basal diam:  3.50 cm RV S prime:     8.92 cm/s TAPSE (M-mode): 2.3 cm LEFT ATRIUM             Index       RIGHT ATRIUM           Index LA diam:        3.70 cm 2.21 cm/m  RA Area:     18.30 cm LA Vol (A2C):   69.7 ml 41.70 ml/m RA Volume:   54.40 ml  32.54 ml/m LA Vol (A4C):   76.7 ml 45.88 ml/m LA Biplane Vol: 76.5 ml 45.76 ml/m  AORTIC VALVE LVOT Vmax:   86.70 cm/s LVOT Vmean:  47.500 cm/s LVOT VTI:    0.150 m AI PHT:      391 msec  AORTA Ao Root diam: 2.60 cm MITRAL VALVE                TRICUSPID VALVE MV Area (PHT): 4.21 cm     TR Peak grad:   38.9 mmHg MV Decel Time: 180 msec      TR Vmax:        312.00 cm/s MV E velocity: 133.00 cm/s MV A velocity: 67.80 cm/s   SHUNTS MV E/A ratio:  1.96         Systemic VTI:  0.15 m                             Systemic Diam: 1.90 cm Thurmon Fair MD Electronically signed by Thurmon Fair MD Signature Date/Time: 12/20/2020/3:22:00 PM    Final     Cardiac Studies   Echo 12/20/20: 1. Left ventricular ejection fraction, by estimation, is 35 to 40%. The  left ventricle has moderately decreased function. The left ventricle  demonstrates global hypokinesis. Left ventricular diastolic parameters are  consistent with Grade II diastolic  dysfunction (pseudonormalization). Elevated left atrial pressure. There is  the interventricular septum is flattened in diastole ('D' shaped left  ventricle), consistent with right ventricular volume overload.  2. Right ventricular systolic function  is mildly reduced. The right  ventricular size is mildly enlarged. There is moderately elevated  pulmonary artery systolic pressure.  3. Left atrial size was moderately dilated.  4. 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  5. Tricuspid valve regurgitation is moderate to severe.  6. The aortic valve is tricuspid. Aortic valve regurgitation is mild to  moderate. No aortic stenosis is present.  7. The inferior vena cava is dilated in size with <50% respiratory  variability, suggesting right atrial pressure of 15 mmHg.   Patient Profile     65 y.o. female with a hx of chronic diastolic heart failure, DM with bilateral diabetic retinopathy, and HTN who was seen for new systolic heart failure.    Assessment & Plan    Acute systolic and diastolic heart failure Mild RV dysfunction - new diagnosis this admission - BNP > 3000 - EF 35-40%, grade 2 DD, moderately elevated PA pressure - lasix were increased to 40 mg IV BID yesterday, she received a dose last evening and this morning, no further doses are currently scheduled  - sCr  increased to 1.64 (1.46), K 4.0 - she feels much better and is able to lie flat - she reports good diuresis, but only about 1000 cc urine output reported - she is down 9 lbs overnight - can consider reducing lasix to 40 mg IV daily starting tomorrow - if renal function stable in the morning, may consider angiography tomorrow   Acute kidney insufficiency - sCr 1.64 (1.46) - K 4.0 - unclear if she now has baseline CKD, will need to follow closely   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

## 2020-12-22 NOTE — Progress Notes (Signed)
Pt is scheduled tomorrow at noon for right and left heart cath pending renal function. Will review BMP in the morning. NPO at MN.

## 2020-12-22 NOTE — Progress Notes (Signed)
PROGRESS NOTE  Maria Foster URK:[id number] DOB: January 16, 1956 DOA: 12/18/2020 PCP: Raymon Mutton., FNP  Brief History   64yow PMH diabetes not on medication, hypertension presented with constipation, exertional dyspnea, admitted for presumed new onset CHF.  Echo revealed new systolic dysfunction.  A & P   Acute systolic/diastolic CHF, new dx; moderate to severe TR. Echo Echo LVEF 35-40%, global hypokinesis, grade II diastolic dysfunction, mod pulm HTN, mod MR, moderate to sever TR --Clinically improved.  Weight down 4 kg.  Renal function has bumped.  Stop diuretic. --Continue metoprolol.  BiDil added by cardiology. --Heart cath tentatively planned for tomorrow --Follow-up in 3 months with cardiology to reassess valves.  Acute kidney injury presumed, last creatinine on file 2018 was 0.95. --Creatinine slightly up with diuresis.  Diuretics stopped.  Repeat BMP in a.m.  DM type 2 w/ bilateral retinopathy, vitreous hemorrhage on right. Hgb A1c 8.6. Not on home medications.  Avoid Metformin. --CBG well controlled.  Continue long-acting insulin.  Aggressive diabetic teaching. --Continue prednisolone drops  Essential hypertension not on any medications --Continue metoprolol.  Now changed to succinate.  Acute on chronic constipation --Aggressive bowel regimen  Nutritional Assessment: Body mass index is 23.56 kg/m.Marland Kitchen Seen by dietician.  I agree with the assessment and plan as outlined below: Nutrition Status: Nutrition Problem: Increased nutrient needs Etiology: acute illness Signs/Symptoms: estimated needs Interventions: Refer to RD note for recommendations  Disposition Plan:  Discussion: As above  Status is: Inpatient  Remains inpatient appropriate because:IV treatments appropriate due to intensity of illness or inability to take PO and Inpatient level of care appropriate due to severity of illness   Dispo: The patient is from: Home              Anticipated d/c is to: Home               Anticipated d/c date is: 3 days              Patient currently is not medically stable to d/c.   Difficult to place patient No DVT prophylaxis: enoxaparin (LOVENOX) injection 30 mg Start: 12/22/20 1000   Code Status: Full Code Level of care: Telemetry Family Communication: none  Brendia Sacks, MD  [facility] Direct contact: see [email or web address] (further directions at bottom of note if needed) 7PM-7AM contact night coverage as at bottom of note 12/22/2020, 5:31 PM  LOS: 2 days   Significant Hospital Events   .    Consults:  . Cardiology    Procedures:  .   Significant Diagnostic Tests:  . Echo LVEF 35-40%, global hypokinesis, grade II diastolic dysfunction, mod pulm HTN, mod MR, moderate to sever TR   Micro Data:  .    Antimicrobials:  .   Interval History/Subjective  CC: f/u SOB  Feels good, breathing well, no complaints.  Objective   Vitals:  Vitals:   12/22/20 1339 12/22/20 1628  BP: 138/77 139/89  Pulse: 68 66  Resp: 20   Temp: (!) 97.4 F (36.3 C) 97.6 F (36.4 C)  SpO2: 100% 100%    Exam:  Constitutional:   . Appears calm and comfortable ENMT:  . grossly normal hearing  Respiratory:  . CTA bilaterally, no w/r/r.  Respiratory effort normal.  Cardiovascular:  . RRR, no m/r/g . No LE extremity edema   . Telemetry SR Psychiatric:  . Mental status o Mood, affect appropriate  I have personally reviewed the following:   Today's Data  . CBG stable . Creatinine  up to 1.64  Scheduled Meds: . aspirin EC  81 mg Oral Q M,W,F  . docusate sodium  100 mg Oral Daily  . enoxaparin (LOVENOX) injection  30 mg Subcutaneous Q24H  . insulin aspart  0-9 Units Subcutaneous TID WC  . insulin glargine  8 Units Subcutaneous Daily  . isosorbide-hydrALAZINE  1 tablet Oral TID  . [START ON 12/23/2020] metoprolol succinate  50 mg Oral Daily  . metoprolol tartrate  25 mg Oral BID  . polyethylene glycol  17 g Oral BID  . potassium chloride  40 mEq  Oral Daily  . senna  1 tablet Oral QHS  . sodium chloride flush  3 mL Intravenous Q12H  . Vitamin D (Ergocalciferol)  50,000 Units Oral Weekly   Continuous Infusions: . sodium chloride      Principal Problem:   Acute combined systolic and diastolic heart failure (HCC) Active Problems:   Benign essential HTN   Diabetes (HCC)   Constipation   AKI (acute kidney injury) (HCC)   Elevated troponin   LOS: 2 days   How to contact the [facility] Attending or Consulting provider 7A - 7P or covering provider during after hours 7P -7A, for this patient?  1. Check the care team in [facility] and look for a) attending/consulting TRH provider listed and b) the [facility] team listed 2. Log into [email or web address] and use 's universal password to access. If you do not have the password, please contact the hospital operator. 3. Locate the [facility] provider you are looking for under [facility] and page to a number that you can be directly reached. 4. If you still have difficulty reaching the provider, please page the [facility] (Director on Call) for the Hospitalists listed on amion for assistance.

## 2020-12-23 ENCOUNTER — Encounter (HOSPITAL_COMMUNITY): Payer: Self-pay | Admitting: Family Medicine

## 2020-12-23 ENCOUNTER — Telehealth: Payer: Self-pay | Admitting: Physician Assistant

## 2020-12-23 DIAGNOSIS — I5041 Acute combined systolic (congestive) and diastolic (congestive) heart failure: Secondary | ICD-10-CM | POA: Diagnosis not present

## 2020-12-23 DIAGNOSIS — N179 Acute kidney failure, unspecified: Secondary | ICD-10-CM | POA: Diagnosis not present

## 2020-12-23 DIAGNOSIS — E11319 Type 2 diabetes mellitus with unspecified diabetic retinopathy without macular edema: Secondary | ICD-10-CM | POA: Diagnosis not present

## 2020-12-23 LAB — GLUCOSE, CAPILLARY
Glucose-Capillary: 51 mg/dL — ABNORMAL LOW (ref 70–99)
Glucose-Capillary: 120 mg/dL — ABNORMAL HIGH (ref 70–99)
Glucose-Capillary: 70 mg/dL (ref 70–99)

## 2020-12-23 LAB — BASIC METABOLIC PANEL
Anion gap: 10 (ref 5–15)
BUN: 35 mg/dL — ABNORMAL HIGH (ref 8–23)
CO2: 25 mmol/L (ref 22–32)
Calcium: 8.6 mg/dL — ABNORMAL LOW (ref 8.9–10.3)
Chloride: 102 mmol/L (ref 98–111)
Creatinine, Ser: 1.69 mg/dL — ABNORMAL HIGH (ref 0.44–1.00)
GFR, Estimated: 34 mL/min — ABNORMAL LOW (ref >60)
Glucose, Bld: 57 mg/dL — ABNORMAL LOW (ref 70–99)
Potassium: 4 mmol/L (ref 3.5–5.1)
Sodium: 137 mmol/L (ref 135–145)

## 2020-12-23 SURGERY — RIGHT/LEFT HEART CATH AND CORONARY ANGIOGRAPHY
Anesthesia: LOCAL

## 2020-12-23 MED ORDER — DAPAGLIFLOZIN PROPANEDIOL 10 MG PO TABS: 10.0000 mg | ORAL_TABLET | Freq: Every day | ORAL | Status: DC

## 2020-12-23 MED ORDER — HYDRALAZINE HCL 50 MG PO TABS
50.0000 mg | ORAL_TABLET | Freq: Three times a day (TID) | ORAL | Status: DC
  Administered 2020-12-23: 50 mg via ORAL
  Filled 2020-12-23: qty 1

## 2020-12-23 MED ORDER — ISOSORB DINITRATE-HYDRALAZINE 20-37.5 MG PO TABS
2.0000 | ORAL_TABLET | Freq: Three times a day (TID) | ORAL | Status: DC
  Filled 2020-12-23: qty 2

## 2020-12-23 MED ORDER — LINAGLIPTIN 5 MG PO TABS
5.0000 mg | ORAL_TABLET | Freq: Every day | ORAL | 1 refills | Status: DC
Start: 2020-12-23 — End: 2021-03-07

## 2020-12-23 MED ORDER — ISOSORBIDE MONONITRATE ER 60 MG PO TB24: 60.0000 mg | ORAL_TABLET | Freq: Every day | ORAL | 2 refills | Status: DC

## 2020-12-23 MED ORDER — METOPROLOL SUCCINATE ER 50 MG PO TB24: 50.0000 mg | ORAL_TABLET | Freq: Every day | ORAL | 2 refills | Status: DC

## 2020-12-23 MED ORDER — DEXTROSE 50 % IV SOLN
25.0000 g | INTRAVENOUS | Status: AC
  Administered 2020-12-23: 25 g via INTRAVENOUS

## 2020-12-23 MED ORDER — HYDRALAZINE HCL 50 MG PO TABS: 50.0000 mg | ORAL_TABLET | Freq: Three times a day (TID) | ORAL | 2 refills | Status: DC

## 2020-12-23 MED ORDER — ISOSORB DINITRATE-HYDRALAZINE 20-37.5 MG PO TABS: 2.0000 | ORAL_TABLET | Freq: Three times a day (TID) | ORAL | 6 refills | Status: DC

## 2020-12-23 MED ORDER — DEXTROSE 50 % IV SOLN
INTRAVENOUS | Status: AC
  Filled 2020-12-23: qty 50

## 2020-12-23 MED ORDER — ISOSORBIDE MONONITRATE ER 60 MG PO TB24
60.0000 mg | ORAL_TABLET | Freq: Every day | ORAL | Status: DC
  Administered 2020-12-23: 60 mg via ORAL
  Filled 2020-12-23: qty 1

## 2020-12-23 NOTE — Progress Notes (Addendum)
Heart Failure Nurse Navigator Progress Note  PCP: Raymon Mutton., FNP PCP-Cardiologist: Chilton Si, MD Admission Diagnosis: CHF Admitted from: home with spouse  Referral from A. Duke, PA appreciated.  Presentation:   [name] presented with [facility] and constipation via [facility]. Noted to have BLE edema. Planned DC today and f/u with outpt R/L heart cath as SCr improves. Referral for pharmacy assistance with medication cost.   ECHO/ LVEF: 35-40%  Clinical Course:  Past Medical History:  Diagnosis Date  . Arthritis   . Cataract   . Diabetes mellitus without complication (HCC)   . Hypertension     Social History   Socioeconomic History  . Marital status: Married    Spouse name: Not on file  . Number of children: Not on file  . Years of education: Not on file  . Highest education level: Not on file  Occupational History  . Not on file  Tobacco Use  . Smoking status: Former Smoker    Packs/day: 0.50    Types: Cigarettes    Quit date: 11/19/2020    Years since quitting: 0.0  . Smokeless tobacco: Never Used  Vaping Use  . Vaping Use: Never used  Substance and Sexual Activity  . Alcohol use: Not Currently  . Drug use: No  . Sexual activity: Not on file  Other Topics Concern  . Not on file  Social History Narrative  . Not on file   Social Determinants of Health   Financial Resource Strain: Medium Risk  . Difficulty of Paying Living Expenses: Somewhat hard  Food Insecurity: No Food Insecurity  . Worried About Programme researcher, broadcasting/film/video in the Last Year: Never true  . Ran Out of Food in the Last Year: Never true  Transportation Needs: No Transportation Needs  . Lack of Transportation (Medical): No  . Lack of Transportation (Non-Medical): No  Physical Activity: Not on file  Stress: Not on file  Social Connections: Not on file    High Risk Criteria for Readmission and/or Poor Patient Outcomes:  Heart failure hospital admissions (last 6 months): 1  No Show  rate: 0%  Difficult social situation: no  Demonstrates medication adherence: yes  Primary Language: English  Literacy level: able to read/write and comprehend  Barriers of Care:   -medication regimen changes  Considerations/Referrals:   Referral made to Heart Failure Pharmacist Stewardship: yes, appreciated Referral made to Heart Impact TOC clinic: yes, appt 2/9 @ 11  Items for Follow-up on DC/TOC: -needs scale -needs pill box -medication cost/assistance -education  Ozella Rocks, RN, BSN Heart Failure TEFL teacher Team [phone]

## 2020-12-23 NOTE — TOC Transition Note (Signed)
Transition of Care [facility]) - CM/SW Discharge Note   Patient Details  Name: Maria Foster MRN: [id number] Date of Birth: 09/14/1956  [facility] CM/SW Contact:  Lanier Clam, RN Phone Number: 12/23/2020, 12:38 PM   Clinical Narrative: Spoke to patient again-patient will make own pcp appt-Monday;she has prescription coverage-informed her she is obligated to her co pay-she voiced understanding.No further CM needs.      Final next level of care: Home/Self Care Barriers to Discharge: No Barriers Identified   Patient Goals and CMS Choice Patient states their goals for this hospitalization and ongoing recovery are:: go home CMS Medicare.gov Compare Post Acute Care list provided to:: Patient    Discharge Placement                       Discharge Plan and Services   Discharge Planning Services: CM Consult                                 Social Determinants of Health (SDOH) Interventions     Readmission Risk Interventions No flowsheet data found.

## 2020-12-23 NOTE — Progress Notes (Signed)
Pt discharged home today per Dr. Goodrich. Pt's IV site D/C'd and WDL. Pt's VSS. Pt provided with home medication list, discharge instructions and prescriptions. Verbalized understanding. Pt left floor via WC in stable condition accompanied by NT. 

## 2020-12-23 NOTE — Discharge Summary (Signed)
Physician Discharge Summary  JERICCA RUSSETT AOZ:[id number] DOB: Mar 22, 1956 DOA: 12/18/2020  PCP: Raymon Mutton., FNP  Admit date: 12/18/2020 Discharge date: 12/23/2020  Recommendations for Outpatient Follow-up:   Acute systolic/diastolic CHF, new dx; moderate to severe TR. Echo Echo LVEF 35-40%, global hypokinesis, grade II diastolic dysfunction, mod pulm HTN, mod MR, moderate to sever TR --Responded well to diuresis.  Outpatient work-up per cardiology including catheterization.  Outpatient echocardiogram and follow-up to reassess valves.  Acute kidney injury presumed, last creatinine on file 2018 was 0.95. --Creatinine appears to be plateauing.  Expect will improve as outpatient.  No diuretic at this point.  Follow-up with cardiology with repeat BMP next week.  DM type 2 w/ bilateral retinopathy, vitreous hemorrhage on right. Hgb A1c 8.6. Not on home medications.  Avoid Metformin. --Well-controlled with small dose of insulin.  As above patient desires oral therapy at this point.  Close monitoring, consider initiating insulin if fails to meet goal.    Follow-up Information    Primary Care Physician. Schedule an appointment as soon as possible for a visit.        Chilton Si, MD. Schedule an appointment as soon as possible for a visit in 1 week(s).   Specialty: Cardiology Why: 1 week for lab draw and make appointment Contact information: [address] [place] [address] [place] [address] [phone]        [facility]. Go on 12/26/2020.   Specialty: Cardiology Why: AT 10AM with [facility]. Entrance C (free valet parking)  Please bring all medicaitons with you to your appointment. You will see a medical provider, pharmacist at your 1 hour appt.  Contact information: [address] [id number] m[place] [address] [phone]               Discharge Diagnoses: Principal diagnosis is  #1 Principal Problem:   Acute combined systolic and diastolic heart failure (HCC) Active Problems:   Benign essential HTN   Diabetes (HCC)   Constipation   AKI (acute kidney injury) (HCC)   Elevated troponin   Discharge Condition: improved Disposition: home  Diet recommendation:  Diet Orders (From admission, onward)    Start     Ordered   12/23/20 1048  Diet heart healthy/carb modified Room service appropriate? Yes; Fluid consistency: Thin  Diet effective now       Question Answer Comment  Diet-HS Snack? Nothing   Room service appropriate? Yes   Fluid consistency: Thin      12/23/20 1047   12/23/20 0000  Diet - low sodium heart healthy        12/23/20 1416   12/23/20 0000  Diet Carb Modified        12/23/20 1416           Filed Weights   12/22/20 0507 12/23/20 0504 12/23/20 0823  Weight: 60.3 kg 65.5 kg 65.7 kg    HPI/Hospital Course:   65yow PMH diabetes not on medication, hypertension presented with constipation, exertional dyspnea, admitted for presumed new onset CHF.  Echo revealed new systolic dysfunction.  Responded very well to diuresis with mild elevation of creatinine.  Seen by cardiology with adjustment of medications, plan for outpatient follow-up and optimization then left and right heart catheterization.  We will follow-up with cardiology next week, repeat BMP.  In regard to diabetes, patient was not on any medications, hemoglobin A1c was elevated but she required very little insulin to maintain her blood  sugars.  After long discussion, she does not desire to start insulin at this point, start Tradjenta, recommend close outpatient follow-up.  Cardiology considering [name].  A & P  Acute systolic/diastolic CHF, new dx; moderate to severe TR. Echo Echo LVEF 35-40%, global hypokinesis, grade II diastolic dysfunction, mod pulm HTN, mod MR, moderate to sever TR --Responded well to diuresis.  Outpatient work-up per cardiology including catheterization.  Outpatient  echocardiogram and follow-up to reassess valves.  Acute kidney injury presumed, last creatinine on file 2018 was 0.95. --Creatinine appears to be plateauing.  Expect will improve as outpatient.  No diuretic at this point.  Follow-up with cardiology with repeat BMP next week.  DM type 2 w/ bilateral retinopathy, vitreous hemorrhage on right. Hgb A1c 8.6. Not on home medications.  Avoid Metformin. --Well-controlled with small dose of insulin.  As above patient desires oral therapy at this point.  Close monitoring, consider initiating insulin if fails to meet goal.  Essential hypertension not on any medications --Continue metoprolol.  Now changed to succinate.  Acute on chronic constipation --Aggressive bowel regimen    Consults:  . Cardiology  Today's assessment: S: CC: f/u CHF  Feels well, breathing well, ready to go home  O: Vitals:  Vitals:   12/23/20 0504 12/23/20 1336  BP: (!) 155/91 (!) 149/90  Pulse: 67 68  Resp: 18 18  Temp: (!) 97.5 F (36.4 C) (!) 97.4 F (36.3 C)  SpO2: 100% 100%    Constitutional:  . Appears calm and comfortable ENMT:  . grossly normal hearing  Respiratory:  . CTA bilaterally, no w/r/r.  . Respiratory effort normal.  Cardiovascular:  . RRR, no m/r/g Psychiatric:  . judgement and insight appear normal . Mental status o Mood, affect appropriate  CBG 1 low at 51 Creatinine stable 1.69, potassium within normal limits.  Discharge Instructions  Discharge Instructions    (HEART FAILURE PATIENTS) Call MD:  Anytime you have any of the following symptoms: 1) 3 pound weight gain in 24 hours or 5 pounds in 1 week 2) shortness of breath, with or without a dry hacking cough 3) swelling in the hands, feet or stomach 4) if you have to sleep on extra pillows at night in order to breathe.   Complete by: As directed    Diet - low sodium heart healthy   Complete by: As directed    Diet Carb Modified   Complete by: As directed    Discharge  instructions   Complete by: As directed    Call your physician or seek immediate medical attention for chest pain, shortness of breath, weight gain, or worsening of condition.   Increase activity slowly   Complete by: As directed      Allergies as of 12/23/2020   No Known Allergies     Medication List    STOP taking these medications   amLODipine 10 MG tablet Commonly known as: NORVASC   diphenhydrAMINE 25 MG tablet Commonly known as: BENADRYL   metoCLOPramide 10 MG tablet Commonly known as: REGLAN   naproxen sodium 220 MG tablet Commonly known as: ALEVE   prednisoLONE acetate 1 % ophthalmic suspension Commonly known as: PRED FORTE     TAKE these medications   aspirin EC 81 MG tablet Take 81 mg by mouth every Monday, Wednesday, and Friday.   docusate sodium 100 MG capsule Commonly known as: COLACE Take 100 mg by mouth daily.   hydrALAZINE 50 MG tablet Commonly known as: APRESOLINE Take 1 tablet (50  mg total) by mouth every 8 (eight) hours.   isosorbide mononitrate 60 MG 24 hr tablet Commonly known as: IMDUR Take 1 tablet (60 mg total) by mouth daily. Start taking on: December 24, 2020   linagliptin 5 MG Tabs tablet Commonly known as: TRADJENTA Take 1 tablet (5 mg total) by mouth daily.   metoprolol succinate 50 MG 24 hr tablet Commonly known as: TOPROL-XL Take 1 tablet (50 mg total) by mouth daily. Take with or immediately following a meal. Start taking on: December 24, 2020   Vitamin D (Ergocalciferol) 1.25 MG (50000 UNIT) Caps capsule Commonly known as: DRISDOL Take 50,000 Units by mouth once a week.      No Known Allergies  The results of significant diagnostics from this hospitalization (including imaging, microbiology, ancillary and laboratory) are listed below for reference.    Significant Diagnostic Studies: DG Abd 2 Views  Result Date: 12/02/2020 CLINICAL DATA:  Constipation times with 1 week after receiving booster. EXAM: ABDOMEN - 2 VIEW  COMPARISON:  Radiographs October 17, 2009. FINDINGS: The bowel gas pattern is normal. There is no evidence of free air. Moderate rectal stool ball with a small volume of formed stool through the remainder of the colon. IMPRESSION: No evidence of bowel obstruction or free air. Moderate rectal stool ball with a small volume of formed stool through the remainder of the colon. Electronically Signed   By: Maudry Mayhew MD   On: 12/02/2020 19:17   DG Abdomen Acute W/Chest  Result Date: 12/19/2020 CLINICAL DATA:  Shortness of breath and constipation EXAM: DG ABDOMEN ACUTE WITH 1 VIEW CHEST COMPARISON:  12/02/2020 FINDINGS: Cardiac shadow is mildly enlarged. Aortic calcifications are seen. Patchy airspace opacity is noted although significantly improved when compared with prior exam. Scattered large and small bowel gas is noted. No free air is seen. No abnormal mass or abnormal calcifications are noted. Mild retained fecal material consistent with constipation is seen although no obstructive changes noted. No bony abnormality is seen. IMPRESSION: Improved aeration in the lungs bilaterally. Changes of mild constipation. Electronically Signed   By: Alcide Clever M.D.   On: 12/19/2020 00:26   ECHOCARDIOGRAM COMPLETE  Result Date: 12/20/2020    ECHOCARDIOGRAM REPORT   Patient Name:   Maria Foster Date of Exam: 12/20/2020 Medical Rec #:  [id number]       Height:       63.0 in Accession #:    [id number]      Weight:       142.0 lb Date of Birth:  [birth year]       BSA:          1.672 m Patient Age:    64 years        BP:           143/88 mmHg Patient Gender: F               HR:           75 bpm. Exam Location:  Inpatient Procedure: 2D Echo, Cardiac Doppler and Color Doppler Indications:    CHF  History:        Patient has no prior history of Echocardiogram examinations.                 CHF, Signs/Symptoms:Shortness of Breath; Risk Factors:Diabetes,                 LE edema and Hypertension.  Sonographer:    Lavenia Atlas Referring Phys: [phone]  MOHAMMAD L GARBA IMPRESSIONS  1. Left ventricular ejection fraction, by estimation, is 35 to 40%. The left ventricle has moderately decreased function. The left ventricle demonstrates global hypokinesis. Left ventricular diastolic parameters are consistent with Grade II diastolic dysfunction (pseudonormalization). Elevated left atrial pressure. There is the interventricular septum is flattened in diastole ('D' shaped left ventricle), consistent with right ventricular volume overload.  2. Right ventricular systolic function is mildly reduced. The right ventricular size is mildly enlarged. There is moderately elevated pulmonary artery systolic pressure.  3. Left atrial size was moderately dilated.  4. 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  5. Tricuspid valve regurgitation is moderate to severe.  6. The aortic valve is tricuspid. Aortic valve regurgitation is mild to moderate. No aortic stenosis is present.  7. The inferior vena cava is dilated in size with <50% respiratory variability, suggesting right atrial pressure of 15 mmHg. FINDINGS  Left Ventricle: Left ventricular ejection fraction, by estimation, is 35 to 40%. The left ventricle has moderately decreased function. The left ventricle demonstrates global hypokinesis. The left ventricular internal cavity size was normal in size. There is borderline concentric left ventricular hypertrophy. The interventricular septum is flattened in diastole ('D' shaped left ventricle), consistent with right ventricular volume overload. Left ventricular diastolic parameters are consistent with Grade II diastolic dysfunction (pseudonormalization). Elevated left atrial pressure. Right Ventricle: The right ventricular size is mildly enlarged. No increase in right ventricular wall thickness. Right ventricular systolic function is mildly reduced. There is moderately elevated pulmonary artery systolic pressure. The tricuspid  regurgitant velocity is 3.12 m/s, and with an assumed right atrial pressure of 15 mmHg, the estimated right ventricular systolic pressure is 53.9 mmHg. Left Atrium: Left atrial size was moderately dilated. Right Atrium: Right atrial size was normal in size. Pericardium: There is no evidence of pericardial effusion. Mitral Valve: The mitral valve is rheumatic. There is mild thickening of the mitral valve leaflet(s). Moderate mitral valve regurgitation, with centrally-directed jet. Tricuspid Valve: The tricuspid valve is normal in structure. Tricuspid valve regurgitation is moderate to severe. Aortic Valve: The aortic valve is tricuspid. Aortic valve regurgitation is mild to moderate. Aortic regurgitation PHT measures 391 msec. No aortic stenosis is present. Pulmonic Valve: The pulmonic valve was normal in structure. Pulmonic valve regurgitation is mild to moderate. Aorta: The aortic root and ascending aorta are structurally normal, with no evidence of dilitation. Venous: The inferior vena cava is dilated in size with less than 50% respiratory variability, suggesting right atrial pressure of 15 mmHg. IAS/Shunts: No atrial level shunt detected by color flow Doppler.  LEFT VENTRICLE PLAX 2D LVIDd:         4.80 cm  Diastology LVIDs:         3.70 cm  LV e' medial:    4.24 cm/s LV PW:         1.10 cm  LV E/e' medial:  31.4 LV IVS:        1.20 cm  LV e' lateral:   6.53 cm/s LVOT diam:     1.90 cm  LV E/e' lateral: 20.4 LV SV:         43 LV SV Index:   25 LVOT Area:     2.84 cm  RIGHT VENTRICLE RV Basal diam:  3.50 cm RV S prime:     8.92 cm/s TAPSE (M-mode): 2.3 cm LEFT ATRIUM             Index       RIGHT  ATRIUM           Index LA diam:        3.70 cm 2.21 cm/m  RA Area:     18.30 cm LA Vol (A2C):   69.7 ml 41.70 ml/m RA Volume:   54.40 ml  32.54 ml/m LA Vol (A4C):   76.7 ml 45.88 ml/m LA Biplane Vol: 76.5 ml 45.76 ml/m  AORTIC VALVE LVOT Vmax:   86.70 cm/s LVOT Vmean:  47.500 cm/s LVOT VTI:    0.150 m AI PHT:       391 msec  AORTA Ao Root diam: 2.60 cm MITRAL VALVE                TRICUSPID VALVE MV Area (PHT): 4.21 cm     TR Peak grad:   38.9 mmHg MV Decel Time: 180 msec     TR Vmax:        312.00 cm/s MV E velocity: 133.00 cm/s MV A velocity: 67.80 cm/s   SHUNTS MV E/A ratio:  1.96         Systemic VTI:  0.15 m                             Systemic Diam: 1.90 cm Rachelle Hora Croitoru MD Electronically signed by Thurmon Fair MD Signature Date/Time: 12/20/2020/3:22:00 PM    Final     Microbiology: Recent Results (from the past 240 hour(s))  SARS CORONAVIRUS 2 (TAT 6-24 HRS) Nasopharyngeal Nasopharyngeal Swab     Status: None   Collection Time: 12/19/20 12:32 AM   Specimen: Nasopharyngeal Swab  Result Value Ref Range Status   SARS Coronavirus 2 NEGATIVE NEGATIVE Final    Comment: (NOTE) SARS-CoV-2 target nucleic acids are NOT DETECTED.  The SARS-CoV-2 RNA is generally detectable in upper and lower respiratory specimens during the acute phase of infection. Negative results do not preclude SARS-CoV-2 infection, do not rule out co-infections with other pathogens, and should not be used as the sole basis for treatment or other patient management decisions. Negative results must be combined with clinical observations, patient history, and epidemiological information. The expected result is Negative.  Fact Sheet for Patients: HairSlick.no  Fact Sheet for Healthcare Providers: quierodirigir.com  This test is not yet approved or cleared by the [place] FDA and  has been authorized for detection and/or diagnosis of SARS-CoV-2 by FDA under an Emergency Use Authorization (EUA). This EUA will remain  in effect (meaning this test can be used) for the duration of the COVID-19 declaration under Se ction 564(b)(1) of the Act, 21 U.S.C. section 360bbb-3(b)(1), unless the authorization is terminated or revoked sooner.  Performed at [facility], [address], [place], [place] [address]      Labs: Basic Metabolic Panel: Recent Labs  Lab 12/19/20 0030 12/20/20 0551 12/21/20 0608 12/22/20 0500 12/23/20 0514  NA 136 136 140 137 137  K 4.2 3.3* 3.8 4.0 4.0  CL 103 103 103 101 102  CO2 21* 23 25 22 25   GLUCOSE 280* 284* 56* 87 57*  BUN 27* 23 26* 33* 35*  CREATININE 1.47* 1.23* 1.46* 1.64* 1.69*  CALCIUM 8.7* 8.5* 8.9 8.6* 8.6*   Liver Function Tests: Recent Labs  Lab 12/19/20 0030 12/21/20 0608  AST 35 30  ALT 32 31  ALKPHOS 122 119  BILITOT 1.5* 1.8*  PROT 7.5 7.5  ALBUMIN 3.3* 3.1*   CBC: Recent Labs  Lab 12/19/20 0030  12/20/20 0551 12/21/20 0608  WBC 6.6 7.4 6.8  NEUTROABS 5.0  --  4.8  HGB 11.0* 10.4* 11.0*  HCT 36.2 33.7* 37.1  MCV 80.3 79.1* 82.6  PLT 310 245 311    Recent Labs    12/19/20 0030  BNP 3,073.4*    CBG: Recent Labs  Lab 12/22/20 1604 12/22/20 2054 12/23/20 0720 12/23/20 0814 12/23/20 1143  GLUCAP 137* 164* 51* 120* 70    Principal Problem:   Acute combined systolic and diastolic heart failure (HCC) Active Problems:   Benign essential HTN   Diabetes (HCC)   Constipation   AKI (acute kidney injury) (HCC)   Elevated troponin   Time coordinating discharge: 40 minutes  Signed:  Brendia Sacks, MD  [facility]  12/23/2020, 7:37 PM

## 2020-12-23 NOTE — Progress Notes (Signed)
   Hypoglycemic Event  CBG: 51  Treatment: D50 50 mL (25 gm)  Symptoms: Hungry  Follow-up CBG: Time: 0814 CBG Result: 120  Possible Reasons for Event: Medication regimen: Lantus  Comments/MD notified: Dr. Elisha Headland, Joyice Faster

## 2020-12-23 NOTE — Plan of Care (Signed)
  Problem: Education: Goal: Ability to demonstrate management of disease process will improve Outcome: Completed/Met Goal: Ability to verbalize understanding of medication therapies will improve Outcome: Completed/Met Goal: Individualized Educational Video(s) Outcome: Completed/Met   Problem: Activity: Goal: Capacity to carry out activities will improve Outcome: Completed/Met   Problem: Cardiac: Goal: Ability to achieve and maintain adequate cardiopulmonary perfusion will improve Outcome: Completed/Met   Problem: Health Behavior/Discharge Planning: Goal: Ability to manage health-related needs will improve Outcome: Completed/Met   Problem: Clinical Measurements: Goal: Ability to maintain clinical measurements within normal limits will improve Outcome: Completed/Met Goal: Will remain free from infection Outcome: Completed/Met Goal: Diagnostic test results will improve Outcome: Completed/Met Goal: Respiratory complications will improve Outcome: Completed/Met Goal: Cardiovascular complication will be avoided Outcome: Completed/Met   Problem: Activity: Goal: Risk for activity intolerance will decrease Outcome: Completed/Met   Problem: Nutrition: Goal: Adequate nutrition will be maintained Outcome: Completed/Met   Problem: Coping: Goal: Level of anxiety will decrease Outcome: Completed/Met   Problem: Elimination: Goal: Will not experience complications related to bowel motility Outcome: Completed/Met Goal: Will not experience complications related to urinary retention Outcome: Completed/Met   Problem: Pain Managment: Goal: General experience of comfort will improve Outcome: Completed/Met   Problem: Safety: Goal: Ability to remain free from injury will improve Outcome: Completed/Met   Problem: Skin Integrity: Goal: Risk for impaired skin integrity will decrease Outcome: Completed/Met

## 2020-12-23 NOTE — Progress Notes (Signed)
Progress Note  Patient Name: Maria Foster Date of Encounter: 12/23/2020  [facility] Cardiologist: Chilton Si, MD   Subjective   She reports lying flat for an extended period of time this morning without dyspnea. She has also ambulated to the bathroom without dyspnea. She feels well today, no chest pain.   Inpatient Medications    Scheduled Meds: . aspirin EC  81 mg Oral Q M,W,F  . docusate sodium  100 mg Oral Daily  . enoxaparin (LOVENOX) injection  30 mg Subcutaneous Q24H  . insulin aspart  0-9 Units Subcutaneous TID WC  . isosorbide-hydrALAZINE  1 tablet Oral TID  . metoprolol succinate  50 mg Oral Daily  . polyethylene glycol  17 g Oral BID  . potassium chloride  40 mEq Oral Daily  . prednisoLONE acetate  1 drop Right Eye QID  . senna  1 tablet Oral QHS  . sodium chloride flush  3 mL Intravenous Q12H  . sodium chloride flush  3 mL Intravenous Q12H  . Vitamin D (Ergocalciferol)  50,000 Units Oral Weekly   Continuous Infusions: . sodium chloride    . sodium chloride    . sodium chloride 10 mL/hr at 12/23/20 0550   PRN Meds: sodium chloride, sodium chloride, acetaminophen, hydrALAZINE, ondansetron (ZOFRAN) IV, sodium chloride flush, sodium chloride flush   Vital Signs    Vitals:   12/22/20 1628 12/22/20 2127 12/23/20 0504 12/23/20 0823  BP: 139/89 (!) 142/82 (!) 155/91   Pulse: 66 68 67   Resp:  18 18   Temp: 97.6 F (36.4 C) 97.9 F (36.6 C) (!) 97.5 F (36.4 C)   TempSrc: Oral Oral Oral   SpO2: 100% 100% 100%   Weight:   65.5 kg 65.7 kg  Height:        Intake/Output Summary (Last 24 hours) at 12/23/2020 1132 Last data filed at 12/23/2020 1022 Gross per 24 hour  Intake 240.03 ml  Output 1000 ml  Net -759.97 ml   Last 3 Weights 12/23/2020 12/23/2020 12/22/2020  Weight (lbs) 144 lb 13.5 oz 144 lb 6.4 oz 133 lb  Weight (kg) 65.7 kg 65.5 kg 60.328 kg      Telemetry    Sinus rhythm in the 60s - Personally Reviewed  ECG    No new tracings -  Personally Reviewed  Physical Exam   GEN: No acute distress.   Neck: improved JVD Cardiac: RRR, no murmurs, rubs, or gallops.  Respiratory: Clear to auscultation bilaterally. GI: Soft, nontender, non-distended  MS: No edema; No deformity. Neuro:  Nonfocal  Psych: Normal affect   Labs    High Sensitivity Troponin:   Recent Labs  Lab 12/19/20 0216 12/19/20 0522  TROPONINIHS 45* 47*      Chemistry Recent Labs  Lab 12/19/20 0030 12/20/20 0551 12/21/20 0608 12/22/20 0500 12/23/20 0514  NA 136   < > 140 137 137  K 4.2   < > 3.8 4.0 4.0  CL 103   < > 103 101 102  CO2 21*   < > 25 22 25   GLUCOSE 280*   < > 56* 87 57*  BUN 27*   < > 26* 33* 35*  CREATININE 1.47*   < > 1.46* 1.64* 1.69*  CALCIUM 8.7*   < > 8.9 8.6* 8.6*  PROT 7.5  --  7.5  --   --   ALBUMIN 3.3*  --  3.1*  --   --   AST 35  --  30  --   --  ALT 32  --  31  --   --   ALKPHOS 122  --  119  --   --   BILITOT 1.5*  --  1.8*  --   --   GFRNONAA 40*   < > 40* 35* 34*  ANIONGAP 12   < > 12 14 10    < > = values in this interval not displayed.     Hematology Recent Labs  Lab 12/19/20 0030 12/20/20 0551 12/21/20 0608  WBC 6.6 7.4 6.8  RBC 4.51 4.26 4.49  HGB 11.0* 10.4* 11.0*  HCT 36.2 33.7* 37.1  MCV 80.3 79.1* 82.6  MCH 24.4* 24.4* 24.5*  MCHC 30.4 30.9 29.6*  RDW 18.3* 18.1* 18.7*  PLT 310 245 311    BNP Recent Labs  Lab 12/19/20 0030  BNP 3,073.4*     DDimer No results for input(s): DDIMER in the last 168 hours.   Radiology    No results found.  Cardiac Studies   Echo 12/20/20: 1. Left ventricular ejection fraction, by estimation, is 35 to 40%. The  left ventricle has moderately decreased function. The left ventricle  demonstrates global hypokinesis. Left ventricular diastolic parameters are  consistent with Grade II diastolic  dysfunction (pseudonormalization). Elevated left atrial pressure. There is  the interventricular septum is flattened in diastole ('D' shaped left   ventricle), consistent with right ventricular volume overload.  2. Right ventricular systolic function is mildly reduced. The right  ventricular size is mildly enlarged. There is moderately elevated  pulmonary artery systolic pressure.  3. Left atrial size was moderately dilated.  4. 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  5. Tricuspid valve regurgitation is moderate to severe.  6. The aortic valve is tricuspid. Aortic valve regurgitation is mild to  moderate. No aortic stenosis is present.  7. The inferior vena cava is dilated in size with <50% respiratory  variability, suggesting right atrial pressure of 15 mmHg.   Patient Profile     65 y.o. female with a hx of chronic diastolic heart failure, DM with bilateral diabetic retinopathy, and HTNwho was seen for new systolic heart failure.  Assessment & Plan    Acute systolic and diastolic heart failure Mild RV dysfunction - new diagnosis this admission - EF 35-40%, grade 2 DD - she has diuresed well, still has some JVD - diuresis held yesterday afternoon for worsening renal function - volume status stable - although weight is up form admission, stable from yesterday - somewhat labile - she is able to lay flat for an extended period of time and ambulates short distances without dyspnea - will hold off on angiography today - will continue to stabilize her volume status for discharge - will plan to see in follow up and plan for OP right and left heart cath - will discuss possible farxiga with attending - CrCl 30    Acute on chronic renal insufficiency - sCr 1.69 (1.64) - held lasix yesterday   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

## 2020-12-23 NOTE — Progress Notes (Signed)
I have sent her bidil 2 tabs TID for 30 days with 6 refills to [facility] on [address] - she will have a zero dollar copay for this.

## 2020-12-23 NOTE — Telephone Encounter (Signed)
David from [facility] [address] Andrey Cota he is calling back to speak with Micah Flesher. He states he spoke with her today. Phone: [phone]

## 2020-12-23 NOTE — Progress Notes (Signed)
History and all data above reviewed.  Patient examined.  I agree with the findings as above.  All available labs, radiology testing, previous records reviewed. Agree with documented assessment and plan.  Maria Foster is a 18F with chronic diastolic heart failure, diabetes, and hypertension admitted with new onset acute systolic and diastolic heart failure. Echo this admission reveals global systolic dysfunction with LVEF 35 to 40%.  Right ventricular function is mildly reduced as well.  Right atrial pressure was 15 mmHg.  She reports a family history of cardiomyopathy.  Her mother had a heart attack.  We plan to get a left heart catheterization.  However her renal function is not at baseline.  She is anxious to go home.  She has no ischemic symptoms.  Therefore we will plan for outpatient left and right heart catheterization.  She is feeling much better with diuresis.  She was started on BiDil given her renal dysfunction.  However this is not affordable.  We will switch it to hydralazine 50 mg every 8 hours.  Add isosorbide mononitrate 60 mg daily.  We discussed getting a scale and weighing herself daily at home.  She will call our office if her weight goes up by 2 pounds in a day or 5 pounds over the course of a week.  We also discussed the importance of sodium intake.  She was started on metoprolol succinate this admission.  Plan to switch hydralazine and Imdur to an ARB or Entresto as an outpatient.  Also plan to start [name] as an outpatient if we can get her enrolled in a assistance program.  She was given a list of primary care physicians and will establish with them upon discharge.  She will be seen in the office by me next week.  We will check a basic metabolic panel at that time.  [facility] will sign off.   Medication Recommendations:  Switch BIdil to hydralazine and Imdur Other recommendations (labs, testing, etc):  BMP in 1 week Follow up as an outpatient: with Ryen Heitmeyer C. Duke Salvia, MD, [facility] on  2/9   Jarvin Ogren C. Duke Salvia, MD, [facility]  12/23/2020 1:47 PM

## 2020-12-26 ENCOUNTER — Encounter (HOSPITAL_COMMUNITY): Payer: Medicare Other

## 2020-12-27 ENCOUNTER — Telehealth (HOSPITAL_COMMUNITY): Payer: Self-pay | Admitting: *Deleted

## 2020-12-27 NOTE — Telephone Encounter (Signed)
Pt sch in HF TOC clinic on 2/9 at 11 am, called pt to confirm appt, she is agreeable and states she will be here, directions provided and pt advised to bring medications with her.

## 2020-12-28 ENCOUNTER — Ambulatory Visit (HOSPITAL_COMMUNITY)
Admit: 2020-12-28 | Discharge: 2020-12-28 | Disposition: A | Discharge status: Home / Self Care | Payer: Medicare Other | Attending: Internal Medicine | Admitting: Internal Medicine

## 2020-12-28 ENCOUNTER — Ambulatory Visit: Payer: Medicare Other | Admitting: Cardiovascular Disease

## 2020-12-28 ENCOUNTER — Other Ambulatory Visit: Payer: Self-pay

## 2020-12-28 ENCOUNTER — Other Ambulatory Visit (HOSPITAL_COMMUNITY): Payer: Self-pay | Admitting: *Deleted

## 2020-12-28 VITALS — BP 106/70 | HR 70 | Wt 150.2 lb

## 2020-12-28 DIAGNOSIS — I110 Hypertensive heart disease with heart failure: Secondary | ICD-10-CM | POA: Diagnosis not present

## 2020-12-28 DIAGNOSIS — I5042 Chronic combined systolic (congestive) and diastolic (congestive) heart failure: Secondary | ICD-10-CM

## 2020-12-28 DIAGNOSIS — N179 Acute kidney failure, unspecified: Secondary | ICD-10-CM

## 2020-12-28 DIAGNOSIS — D509 Iron deficiency anemia, unspecified: Secondary | ICD-10-CM | POA: Insufficient documentation

## 2020-12-28 DIAGNOSIS — I700 Atherosclerosis of aorta: Secondary | ICD-10-CM | POA: Diagnosis not present

## 2020-12-28 DIAGNOSIS — I5022 Chronic systolic (congestive) heart failure: Secondary | ICD-10-CM

## 2020-12-28 DIAGNOSIS — E119 Type 2 diabetes mellitus without complications: Secondary | ICD-10-CM | POA: Diagnosis not present

## 2020-12-28 DIAGNOSIS — I082 Rheumatic disorders of both aortic and tricuspid valves: Secondary | ICD-10-CM | POA: Insufficient documentation

## 2020-12-28 DIAGNOSIS — I5082 Biventricular heart failure: Secondary | ICD-10-CM | POA: Diagnosis present

## 2020-12-28 DIAGNOSIS — E11319 Type 2 diabetes mellitus with unspecified diabetic retinopathy without macular edema: Secondary | ICD-10-CM | POA: Diagnosis not present

## 2020-12-28 DIAGNOSIS — Z87891 Personal history of nicotine dependence: Secondary | ICD-10-CM | POA: Diagnosis not present

## 2020-12-28 LAB — BASIC METABOLIC PANEL
Anion gap: 10 (ref 5–15)
BUN: 24 mg/dL — ABNORMAL HIGH (ref 8–23)
CO2: 26 mmol/L (ref 22–32)
Calcium: 8.7 mg/dL — ABNORMAL LOW (ref 8.9–10.3)
Chloride: 104 mmol/L (ref 98–111)
Creatinine, Ser: 1.59 mg/dL — ABNORMAL HIGH (ref 0.44–1.00)
GFR, Estimated: 36 mL/min — ABNORMAL LOW (ref >60)
Glucose, Bld: 121 mg/dL — ABNORMAL HIGH (ref 70–99)
Potassium: 4.4 mmol/L (ref 3.5–5.1)
Sodium: 140 mmol/L (ref 135–145)

## 2020-12-28 LAB — CBC
HCT: 35.8 % — ABNORMAL LOW (ref 36.0–46.0)
Hemoglobin: 10.8 g/dL — ABNORMAL LOW (ref 12.0–15.0)
MCH: 24 pg — ABNORMAL LOW (ref 26.0–34.0)
MCHC: 30.2 g/dL (ref 30.0–36.0)
MCV: 79.6 fL — ABNORMAL LOW (ref 80.0–100.0)
Platelets: 331 10*3/uL (ref 150–400)
RBC: 4.5 MIL/uL (ref 3.87–5.11)
RDW: 17.9 % — ABNORMAL HIGH (ref 11.5–15.5)
WBC: 6.5 10*3/uL (ref 4.0–10.5)
nRBC: 0 % (ref 0.0–0.2)

## 2020-12-28 LAB — IRON AND TIBC
Iron: 23 ug/dL — ABNORMAL LOW (ref 28–170)
Saturation Ratios: 6 % — ABNORMAL LOW (ref 10.4–31.8)
TIBC: 392 ug/dL (ref 250–450)
UIBC: 369 ug/dL

## 2020-12-28 LAB — FERRITIN: Ferritin: 71 ng/mL (ref 11–307)

## 2020-12-28 MED ORDER — DAPAGLIFLOZIN PROPANEDIOL 10 MG PO TABS: 10.0000 mg | ORAL_TABLET | Freq: Every day | ORAL | 6 refills | Status: DC

## 2020-12-28 MED ORDER — SODIUM CHLORIDE 0.9% FLUSH: 3.0000 mL | Freq: Two times a day (BID) | INTRAVENOUS | Status: DC

## 2020-12-28 MED ORDER — POTASSIUM CHLORIDE CRYS ER 20 MEQ PO TBCR: 20.0000 meq | EXTENDED_RELEASE_TABLET | Freq: Every day | ORAL | 3 refills | Status: DC | PRN

## 2020-12-28 MED ORDER — FUROSEMIDE 40 MG PO TABS: 40.0000 mg | ORAL_TABLET | Freq: Every day | ORAL | 3 refills | Status: DC | PRN

## 2020-12-28 NOTE — Addendum Note (Signed)
Encounter addended by: Dolores Patty, MD on: 12/28/2020 11:27 PM  Actions taken: Level of Service modified, Clinical Note Signed

## 2020-12-28 NOTE — Progress Notes (Signed)
[facility]  Heart Failure Pharmacist Encounter  Counseled patient and her daughter on new prescription for Farxiga 10 mg daily. Test claim was performed and her copay will be $9.85 per month. Confirmed this was affordable for her. All questions were answered. Instructed patient to call back to the clinic if she has further questions.   Sharen Hones, PharmD, BCPS Heart Failure [facility] Pharmacist [phone]

## 2020-12-28 NOTE — H&P (View-Only) (Signed)
[facility]  XBJ:YNWGN, Fred Primary Cardiologist: Chilton Si  HPI:  Maria Foster is a 65 y.o.  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     Primarily she was managed outpatient for HTN and T2DM. She had intermittent adherence to her amlodipine.       She was admitted 12/19/20 with increasing dyspnea and lower extremity edema for a month before her presentation was also found to be hypertensive and with acute kidney injury vs progression of CKD due to poorly controlled HTN and T2DM.  She denied any preceding chest pain or discomfort.  She was started on IV lasix at low dosage give she was naive, cardiology was consulted.  They continued diuresis with plans to perform [facility] during her hospitalization however creatinine worsened slightly with diuresis and patient was feeling better and anxious to go home so plans were made to arrange this in the outpatient setting. She received an ECHO during her stay:  1. Left ventricular ejection fraction, by estimation, is 35 to 40%. The  left ventricle has moderately decreased function. The left ventricle  demonstrates global hypokinesis. Left ventricular diastolic parameters are  consistent with Grade II diastolic  dysfunction (pseudonormalization). Elevated left atrial pressure. There is  the interventricular septum is flattened in diastole ('D' shaped left  ventricle), consistent with right ventricular volume overload.  2. Right ventricular systolic function is mildly reduced. The right  ventricular size is mildly enlarged. There is moderately elevated  pulmonary artery systolic pressure.  3. Left atrial size was moderately dilated.  4. 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  5. Tricuspid valve regurgitation is moderate to severe.  6. The aortic valve is tricuspid. Aortic valve regurgitation is mild to  moderate. No aortic stenosis is  present.  7. The inferior vena cava is dilated in size with <50% respiratory  variability, suggesting right atrial pressure of 15 mmHg.     Today was the first day she exerted herself since hospitalization.  She went back and forth to the mail box without issue, denies any dyspnea.  Last time she was able to do that was over one month ago.  She has not been weighing herself at home.  She denies any chest pain and had no chest pain leading up to or during hospitalization.    ROS: All systems negative except as listed in HPI, PMH and Problem List.  SH:  Social History   Socioeconomic History  . Marital status: Married    Spouse name: Not on file  . Number of children: Not on file  . Years of education: Not on file  . Highest education level: Not on file  Occupational History  . Not on file  Tobacco Use  . Smoking status: Former Smoker    Packs/day: 0.50    Types: Cigarettes    Quit date: 11/19/2020    Years since quitting: 0.1  . Smokeless tobacco: Never Used  Vaping Use  . Vaping Use: Never used  Substance and Sexual Activity  . Alcohol use: Not Currently  . Drug use: No  . Sexual activity: Not on file  Other Topics Concern  . Not on file  Social History Narrative  . Not on file   Social Determinants of Health   Financial Resource Strain: Medium Risk  . Difficulty of Paying Living Expenses: Somewhat hard  Food Insecurity: No Food Insecurity  . Worried About [employer] of  Food in the Last Year: Never true  . Ran Out of Food in the Last Year: Never true  Transportation Needs: No Transportation Needs  . Lack of Transportation (Medical): No  . Lack of Transportation (Non-Medical): No  Physical Activity: Not on file  Stress: Not on file  Social Connections: Not on file  Intimate Partner Violence: Not on file    FH:  Family History  Problem Relation Age of Onset  . Amblyopia Neg Hx   . Blindness Neg Hx   . Cataracts Neg Hx   . Glaucoma Neg Hx   . Macular  degeneration Neg Hx   . Retinal detachment Neg Hx   . Strabismus Neg Hx   . Retinitis pigmentosa Neg Hx     Past Medical History:  Diagnosis Date  . Arthritis   . Cataract   . Diabetes mellitus without complication (HCC)   . Hypertension     Current Outpatient Medications  Medication Sig Dispense Refill  . aspirin EC 81 MG tablet Take 81 mg by mouth every Monday, Wednesday, and Friday.    . docusate sodium (COLACE) 100 MG capsule Take 100 mg by mouth daily as needed for mild constipation.    . hydrALAZINE (APRESOLINE) 50 MG tablet Take 1 tablet (50 mg total) by mouth every 8 (eight) hours. 90 tablet 2  . isosorbide mononitrate (IMDUR) 60 MG 24 hr tablet Take 1 tablet (60 mg total) by mouth daily. 30 tablet 2  . linagliptin (TRADJENTA) 5 MG TABS tablet Take 1 tablet (5 mg total) by mouth daily. 30 tablet 1  . metoprolol succinate (TOPROL-XL) 50 MG 24 hr tablet Take 1 tablet (50 mg total) by mouth daily. Take with or immediately following a meal. 30 tablet 2  . Vitamin D, Ergocalciferol, (DRISDOL) 1.25 MG (50000 UNIT) CAPS capsule Take 50,000 Units by mouth once a week.     No current facility-administered medications for this encounter.    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 -Aortic Atherosclerosis on prior imaging and diabetic retinopathy so likely has CAD but likely  nonobstructive  -NYHA Class I-II -Currently on metoprolol succinate 50mg, IMDUR 60 and hydralazine 50mg -Add Farxiga today and repeat bmp  -lungs completely clear, bp well controlled, some extra volume looks like mainly right sided volume so will give lasix PRN with K, farxiga will also help -will order compression stockings for LE edema -will schedule R/LHC   T2DM: -A1C 8.6 on 12/20/20 -she was started on tradjenta -will add farxiga  AKI likely on CKD3a: -recheck renal function today -add farxiga  Microcytic Anemia: -check iron studies, likely iron deficient and will benefit from IV iron, perhaps she can get this on the day of her cath  Follow up with [facility]  William Winfrey, MD  Patient seen and examined with the above-signed Advanced Practice Provider and/or Housestaff. I personally reviewed laboratory   data, imaging studies and relevant notes. I independently examined the patient and formulated the important aspects of the plan. I have edited the note to reflect any of my changes or salient points. I have personally discussed the plan with the patient and/or family.  65 y/o woman with DM2, HTN recently admitted with new onset HF. Echo (reviewed personally) shows EF 35-40% with global HK, LVH and moderate RV dysfunction. Cath not done in hospital due to AKI (which is improving).   Denies CP. Continues to have DOE and edema. Daughter says she snores heavily.   General:  Sitting in chair No resp difficulty HEENT: normal Neck: supple. JVP 8 with prominent CV waves . Carotids 2+ bilat; no bruits. No lymphadenopathy or thryomegaly appreciated. Cor: PMI nondisplaced. Regular rate & rhythm. 2/6 TR Lungs: clear Abdomen: soft, nontender, nondistended. No hepatosplenomegaly. No bruits or masses. Good bowel sounds. Extremities: no cyanosis, clubbing, rash, 1-2+ edema Neuro: alert & orientedx3, cranial nerves grossly intact. moves all 4 extremities w/o difficulty. Affect pleasant  ECG: NSR  with LVH No q waves or ischemic changes. 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  She remains volume overloaded. Agree with adding SGLT2i and likely needs spiro soon as well.   Will plan R/L cath next week and will need sleep study. F/u in HF Clinic.   Arvilla Meres, MD  11:26 PM

## 2020-12-28 NOTE — Progress Notes (Addendum)
[facility]  XBJ:YNWGN, Fred Primary Cardiologist: Chilton Si  HPI:  Maria Foster is a 65 y.o.  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     Primarily she was managed outpatient for HTN and T2DM. She had intermittent adherence to her amlodipine.       She was admitted 12/19/20 with increasing dyspnea and lower extremity edema for a month before her presentation was also found to be hypertensive and with acute kidney injury vs progression of CKD due to poorly controlled HTN and T2DM.  She denied any preceding chest pain or discomfort.  She was started on IV lasix at low dosage give she was naive, cardiology was consulted.  They continued diuresis with plans to perform [facility] during her hospitalization however creatinine worsened slightly with diuresis and patient was feeling better and anxious to go home so plans were made to arrange this in the outpatient setting. She received an ECHO during her stay:  1. Left ventricular ejection fraction, by estimation, is 35 to 40%. The  left ventricle has moderately decreased function. The left ventricle  demonstrates global hypokinesis. Left ventricular diastolic parameters are  consistent with Grade II diastolic  dysfunction (pseudonormalization). Elevated left atrial pressure. There is  the interventricular septum is flattened in diastole ('D' shaped left  ventricle), consistent with right ventricular volume overload.  2. Right ventricular systolic function is mildly reduced. The right  ventricular size is mildly enlarged. There is moderately elevated  pulmonary artery systolic pressure.  3. Left atrial size was moderately dilated.  4. 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  5. Tricuspid valve regurgitation is moderate to severe.  6. The aortic valve is tricuspid. Aortic valve regurgitation is mild to  moderate. No aortic stenosis is  present.  7. The inferior vena cava is dilated in size with <50% respiratory  variability, suggesting right atrial pressure of 15 mmHg.     Today was the first day she exerted herself since hospitalization.  She went back and forth to the mail box without issue, denies any dyspnea.  Last time she was able to do that was over one month ago.  She has not been weighing herself at home.  She denies any chest pain and had no chest pain leading up to or during hospitalization.    ROS: All systems negative except as listed in HPI, PMH and Problem List.  SH:  Social History   Socioeconomic History  . Marital status: Married    Spouse name: Not on file  . Number of children: Not on file  . Years of education: Not on file  . Highest education level: Not on file  Occupational History  . Not on file  Tobacco Use  . Smoking status: Former Smoker    Packs/day: 0.50    Types: Cigarettes    Quit date: 11/19/2020    Years since quitting: 0.1  . Smokeless tobacco: Never Used  Vaping Use  . Vaping Use: Never used  Substance and Sexual Activity  . Alcohol use: Not Currently  . Drug use: No  . Sexual activity: Not on file  Other Topics Concern  . Not on file  Social History Narrative  . Not on file   Social Determinants of Health   Financial Resource Strain: Medium Risk  . Difficulty of Paying Living Expenses: Somewhat hard  Food Insecurity: No Food Insecurity  . Worried About [employer] of  Food in the Last Year: Never true  . Ran Out of Food in the Last Year: Never true  Transportation Needs: No Transportation Needs  . Lack of Transportation (Medical): No  . Lack of Transportation (Non-Medical): No  Physical Activity: Not on file  Stress: Not on file  Social Connections: Not on file  Intimate Partner Violence: Not on file    FH:  Family History  Problem Relation Age of Onset  . Amblyopia Neg Hx   . Blindness Neg Hx   . Cataracts Neg Hx   . Glaucoma Neg Hx   . Macular  degeneration Neg Hx   . Retinal detachment Neg Hx   . Strabismus Neg Hx   . Retinitis pigmentosa Neg Hx     Past Medical History:  Diagnosis Date  . Arthritis   . Cataract   . Diabetes mellitus without complication (HCC)   . Hypertension     Current Outpatient Medications  Medication Sig Dispense Refill  . aspirin EC 81 MG tablet Take 81 mg by mouth every Monday, Wednesday, and Friday.    . docusate sodium (COLACE) 100 MG capsule Take 100 mg by mouth daily as needed for mild constipation.    . hydrALAZINE (APRESOLINE) 50 MG tablet Take 1 tablet (50 mg total) by mouth every 8 (eight) hours. 90 tablet 2  . isosorbide mononitrate (IMDUR) 60 MG 24 hr tablet Take 1 tablet (60 mg total) by mouth daily. 30 tablet 2  . linagliptin (TRADJENTA) 5 MG TABS tablet Take 1 tablet (5 mg total) by mouth daily. 30 tablet 1  . metoprolol succinate (TOPROL-XL) 50 MG 24 hr tablet Take 1 tablet (50 mg total) by mouth daily. Take with or immediately following a meal. 30 tablet 2  . Vitamin D, Ergocalciferol, (DRISDOL) 1.25 MG (50000 UNIT) CAPS capsule Take 50,000 Units by mouth once a week.     No current facility-administered medications for this encounter.    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 -Aortic Atherosclerosis on prior imaging and diabetic retinopathy so likely has CAD but likely  nonobstructive  -NYHA Class I-II -Currently on metoprolol succinate 50mg , IMDUR 60 and hydralazine 50mg  -Add [place] today and repeat bmp  -lungs completely clear, bp well controlled, some extra volume looks like mainly right sided volume so will give lasix PRN with K, farxiga will also help -will order compression stockings for LE edema -will schedule R/LHC   T2DM: -A1C 8.6 on 12/20/20 -she was started on tradjenta -will add farxiga  AKI likely on CKD3a: -recheck renal function today -add farxiga  Microcytic Anemia: -check iron studies, likely iron deficient and will benefit from IV iron, perhaps she can get this on the day of her cath  Follow up with [facility]  Chana Bode, MD  Patient seen and examined with the above-signed Advanced Practice Provider and/or Housestaff. I personally reviewed laboratory  data, imaging studies and relevant notes. I independently examined the patient and formulated the important aspects of the plan. I have edited the note to reflect any of my changes or salient points. I have personally discussed the plan with the patient and/or family.  65 y/o woman with DM2, HTN recently admitted with new onset HF. Echo (reviewed personally) shows EF 35-40% with global HK, LVH and moderate RV dysfunction. Cath not done in hospital due to AKI (which is improving).   Denies CP. Continues to have DOE and edema. Daughter says she snores heavily.   General:  Sitting in chair No resp difficulty HEENT: normal Neck: supple. JVP 8 with prominent CV waves . Carotids 2+ bilat; no bruits. No lymphadenopathy or thryomegaly appreciated. Cor: PMI nondisplaced. Regular rate & rhythm. 2/6 TR Lungs: clear Abdomen: soft, nontender, nondistended. No hepatosplenomegaly. No bruits or masses. Good bowel sounds. Extremities: no cyanosis, clubbing, rash, 1-2+ edema Neuro: alert & orientedx3, cranial nerves grossly intact. moves all 4 extremities w/o difficulty. Affect pleasant  ECG: NSR  with LVH No q waves or ischemic changes. 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  She remains volume overloaded. Agree with adding SGLT2i and likely needs spiro soon as well.   Will plan R/L cath next week and will need sleep study. F/u in HF Clinic.   Arvilla Meres, MD  11:26 PM

## 2020-12-28 NOTE — Patient Instructions (Addendum)
Start Farxiga 10 mg Daily  Take Furosemide 40 mg (1 tab) AS NEEDED for fluid retention  Any time you take Furosemide take Klor-Con (potassium) 20 meq (1 tab)  Your provider has prescribed Farxiga for you. Please be aware the most common side effect of this medication is urinary tract infections and yeast infections. Please practice good hygiene and keep this area clean and dry to help prevent this. If you do begin to have symptoms of these infections, such as difficulty urinating or painful urination,  please let us know.  Please wear your compression hose daily, place them on as soon as you get up in the morning and remove before you go to bed at night.  Only take your water pill (Furosemide) if you notice a weight gain of 3 pounds in 1 day. If you notice that you need to use your water pill often, call the clinic.  Do the following things EVERYDAY: 1) Weigh yourself in the morning before breakfast. Write it down and keep it in a log. 2) Take your medicines as prescribed 3) Eat low salt foods-Limit salt (sodium) to 2000 mg per day.  4) Stay as active as you can everyday 5) Limit all fluids for the day to less than 2 liters  Weigh yourself EVERY morning after you go to the bathroom but before you eat or drink anything. Write this number down in a weight log/diary. If you gain 3 pounds overnight or 5 pounds in a week, call the heart failure clinic  Thank you for allowing [place] to provider your heart failure care after your recent hospitalization. Please follow-up with our [facility] in 4 weeks  If you have any questions or concerns before your next appointment please send [place] a message through [place] or call our office at [phone].    TO LEAVE A MESSAGE FOR THE NURSE SELECT OPTION 2, PLEASE LEAVE A MESSAGE INCLUDING: . YOUR NAME . DATE OF BIRTH . CALL BACK NUMBER . REASON FOR CALL**this is important as we prioritize the call backs  YOU WILL RECEIVE A CALL BACK THE  SAME DAY AS LONG AS YOU CALL BEFORE 4:00 PM  At the [facility], you and your health needs are our priority. As part of our continuing mission to provide you with exceptional heart care, we have created designated Provider Care Teams. These Care Teams include your primary Cardiologist (physician) and Advanced Practice Providers (APPs- Physician Assistants and Nurse Practitioners) who all work together to provide you with the care you need, when you need it.   You may see any of the following providers on your designated Care Team at your next follow up: Marland Kitchen Dr Arvilla Meres . Dr Marca Ancona . Tonye Becket, NP . Robbie Lis, PA . Shanda Bumps [place],NP . [name], PharmD   Please be sure to bring in all your medications bottles to every appointment.    CARDIAC CATHETERIZATION INSTRUCTIONS:  You are scheduled for a Cardiac Catheterization on Thursday, February 17 with Dr. Arvilla Meres.  1. Please arrive at the [facility] (Main Entrance A) at [facility]: [address] [place], [place] [address] at 8:30 AM (This time is two hours before your procedure to ensure your preparation). Free valet parking service is available.   Special note: Every effort is made to have your procedure done on time. Please understand that emergencies sometimes delay scheduled procedures.  2. Diet: Do not eat solid foods after midnight.  The patient  may have clear liquids until 5am upon the day of the procedure.  3. COVID TEST: Tuesday 01/03/21 AT 11:15 AT:    [address], [place]  4. Medication instructions in preparation for your procedure:    Thursday 2/17 AM DO NOT TAKE:   TRADJENTA OR FARXIGA  On the morning of your procedure, take your Aspirin and any morning medicines NOT listed above.  You may use sips of water.  5. Plan for one night stay--bring personal belongings. 6. Bring a current list of your medications and current insurance cards. 7. You MUST  have a responsible person to drive you home. 8. Someone MUST be with you the first 24 hours after you arrive home or your discharge will be delayed. 9. Please wear clothes that are easy to get on and off and wear slip-on shoes.  Thank you for allowing [place] to care for you!   -- [facility]

## 2021-01-03 ENCOUNTER — Other Ambulatory Visit (HOSPITAL_COMMUNITY)
Admission: RE | Admit: 2021-01-03 | Discharge: 2021-01-03 | Disposition: A | Discharge status: Home / Self Care | Payer: Medicare Other | Source: Ambulatory Visit | Attending: Internal Medicine | Admitting: Internal Medicine

## 2021-01-03 DIAGNOSIS — Z01812 Encounter for preprocedural laboratory examination: Secondary | ICD-10-CM | POA: Insufficient documentation

## 2021-01-03 DIAGNOSIS — Z20822 Contact with and (suspected) exposure to covid-19: Secondary | ICD-10-CM | POA: Insufficient documentation

## 2021-01-03 LAB — SARS CORONAVIRUS 2 (TAT 6-24 HRS): SARS Coronavirus 2: NEGATIVE

## 2021-01-05 ENCOUNTER — Encounter (HOSPITAL_COMMUNITY)
Admission: RE | Disposition: A | Discharge status: Home / Self Care | Payer: Self-pay | Source: Home / Self Care | Attending: Internal Medicine

## 2021-01-05 ENCOUNTER — Encounter (HOSPITAL_COMMUNITY): Payer: Self-pay | Admitting: Internal Medicine

## 2021-01-05 ENCOUNTER — Other Ambulatory Visit: Payer: Self-pay

## 2021-01-05 ENCOUNTER — Ambulatory Visit (HOSPITAL_COMMUNITY)
Admission: RE | Admit: 2021-01-05 | Discharge: 2021-01-05 | Disposition: A | Discharge status: Home / Self Care | Payer: Medicare Other | Attending: Internal Medicine | Admitting: Internal Medicine

## 2021-01-05 DIAGNOSIS — E1122 Type 2 diabetes mellitus with diabetic chronic kidney disease: Secondary | ICD-10-CM | POA: Diagnosis not present

## 2021-01-05 DIAGNOSIS — I2510 Atherosclerotic heart disease of native coronary artery without angina pectoris: Secondary | ICD-10-CM | POA: Diagnosis not present

## 2021-01-05 DIAGNOSIS — I130 Hypertensive heart and chronic kidney disease with heart failure and stage 1 through stage 4 chronic kidney disease, or unspecified chronic kidney disease: Secondary | ICD-10-CM | POA: Insufficient documentation

## 2021-01-05 DIAGNOSIS — I5022 Chronic systolic (congestive) heart failure: Secondary | ICD-10-CM | POA: Diagnosis not present

## 2021-01-05 DIAGNOSIS — Z87891 Personal history of nicotine dependence: Secondary | ICD-10-CM | POA: Insufficient documentation

## 2021-01-05 DIAGNOSIS — Z7982 Long term (current) use of aspirin: Secondary | ICD-10-CM | POA: Insufficient documentation

## 2021-01-05 DIAGNOSIS — I5042 Chronic combined systolic (congestive) and diastolic (congestive) heart failure: Secondary | ICD-10-CM | POA: Diagnosis not present

## 2021-01-05 DIAGNOSIS — N189 Chronic kidney disease, unspecified: Secondary | ICD-10-CM | POA: Diagnosis not present

## 2021-01-05 DIAGNOSIS — D509 Iron deficiency anemia, unspecified: Secondary | ICD-10-CM | POA: Diagnosis not present

## 2021-01-05 DIAGNOSIS — Z79899 Other long term (current) drug therapy: Secondary | ICD-10-CM | POA: Diagnosis not present

## 2021-01-05 DIAGNOSIS — I2582 Chronic total occlusion of coronary artery: Secondary | ICD-10-CM | POA: Insufficient documentation

## 2021-01-05 DIAGNOSIS — I5082 Biventricular heart failure: Secondary | ICD-10-CM | POA: Insufficient documentation

## 2021-01-05 HISTORY — PX: RIGHT/LEFT HEART CATH AND CORONARY ANGIOGRAPHY: CATH118266

## 2021-01-05 LAB — POCT I-STAT EG7
Acid-Base Excess: 0 mmol/L (ref 0.0–2.0)
Acid-base deficit: 2 mmol/L (ref 0.0–2.0)
Bicarbonate: 23.9 mmol/L (ref 20.0–28.0)
Bicarbonate: 25.7 mmol/L (ref 20.0–28.0)
Calcium, Ion: 1.03 mmol/L — ABNORMAL LOW (ref 1.15–1.40)
Calcium, Ion: 1.16 mmol/L (ref 1.15–1.40)
HCT: 30 % — ABNORMAL LOW (ref 36.0–46.0)
HCT: 30 % — ABNORMAL LOW (ref 36.0–46.0)
Hemoglobin: 10.2 g/dL — ABNORMAL LOW (ref 12.0–15.0)
Hemoglobin: 10.2 g/dL — ABNORMAL LOW (ref 12.0–15.0)
O2 Saturation: 67 %
O2 Saturation: 61 %
Potassium: 3.3 mmol/L — ABNORMAL LOW (ref 3.5–5.1)
Potassium: 3.6 mmol/L (ref 3.5–5.1)
Sodium: 146 mmol/L — ABNORMAL HIGH (ref 135–145)
Sodium: 143 mmol/L (ref 135–145)
TCO2: 25 mmol/L (ref 22–32)
TCO2: 27 mmol/L (ref 22–32)
pCO2, Ven: 42.8 mmHg — ABNORMAL LOW (ref 44.0–60.0)
pCO2, Ven: 46.4 mmHg (ref 44.0–60.0)
pH, Ven: 7.356 (ref 7.250–7.430)
pH, Ven: 7.352 (ref 7.250–7.430)
pO2, Ven: 37 mmHg (ref 32.0–45.0)
pO2, Ven: 34 mmHg (ref 32.0–45.0)

## 2021-01-05 LAB — POCT I-STAT 7, (LYTES, BLD GAS, ICA,H+H)
Acid-base deficit: 2 mmol/L (ref 0.0–2.0)
Bicarbonate: 23.7 mmol/L (ref 20.0–28.0)
Calcium, Ion: 1.07 mmol/L — ABNORMAL LOW (ref 1.15–1.40)
HCT: 30 % — ABNORMAL LOW (ref 36.0–46.0)
Hemoglobin: 10.2 g/dL — ABNORMAL LOW (ref 12.0–15.0)
O2 Saturation: 97 %
Potassium: 3.5 mmol/L (ref 3.5–5.1)
Sodium: 144 mmol/L (ref 135–145)
TCO2: 25 mmol/L (ref 22–32)
pCO2 arterial: 42 mmHg (ref 32.0–48.0)
pH, Arterial: 7.359 (ref 7.350–7.450)
pO2, Arterial: 100 mmHg (ref 83.0–108.0)

## 2021-01-05 LAB — GLUCOSE, CAPILLARY: Glucose-Capillary: 103 mg/dL — ABNORMAL HIGH (ref 70–99)

## 2021-01-05 SURGERY — RIGHT/LEFT HEART CATH AND CORONARY ANGIOGRAPHY
Anesthesia: LOCAL

## 2021-01-05 MED ORDER — FENTANYL CITRATE (PF) 100 MCG/2ML IJ SOLN
INTRAMUSCULAR | Status: DC | PRN
  Administered 2021-01-05: 25 ug via INTRAVENOUS

## 2021-01-05 MED ORDER — LIDOCAINE HCL (PF) 1 % IJ SOLN
INTRAMUSCULAR | Status: DC | PRN
  Administered 2021-01-05 (×2): 2 mL

## 2021-01-05 MED ORDER — HEPARIN SODIUM (PORCINE) 1000 UNIT/ML IJ SOLN
INTRAMUSCULAR | Status: DC | PRN
  Administered 2021-01-05: 3500 [IU] via INTRAVENOUS

## 2021-01-05 MED ORDER — SODIUM CHLORIDE 0.9 % IV SOLN: INTRAVENOUS | Status: DC

## 2021-01-05 MED ORDER — SODIUM CHLORIDE 0.9% FLUSH: 3.0000 mL | INTRAVENOUS | Status: DC | PRN

## 2021-01-05 MED ORDER — LABETALOL HCL 5 MG/ML IV SOLN: 10.0000 mg | INTRAVENOUS | Status: DC | PRN

## 2021-01-05 MED ORDER — SODIUM CHLORIDE 0.9 % IV SOLN: 250.0000 mL | INTRAVENOUS | Status: DC | PRN

## 2021-01-05 MED ORDER — ONDANSETRON HCL 4 MG/2ML IJ SOLN: 4.0000 mg | Freq: Four times a day (QID) | INTRAMUSCULAR | Status: DC | PRN

## 2021-01-05 MED ORDER — SODIUM CHLORIDE 0.9% FLUSH: 3.0000 mL | Freq: Two times a day (BID) | INTRAVENOUS | Status: DC

## 2021-01-05 MED ORDER — HYDRALAZINE HCL 20 MG/ML IJ SOLN
10.0000 mg | INTRAMUSCULAR | Status: DC | PRN
  Administered 2021-01-05: 10 mg via INTRAVENOUS
  Filled 2021-01-05: qty 1

## 2021-01-05 MED ORDER — HEPARIN (PORCINE) IN NACL 1000-0.9 UT/500ML-% IV SOLN
INTRAVENOUS | Status: DC | PRN
  Administered 2021-01-05 (×2): 500 mL

## 2021-01-05 MED ORDER — FENTANYL CITRATE (PF) 100 MCG/2ML IJ SOLN
INTRAMUSCULAR | Status: AC
  Filled 2021-01-05: qty 2

## 2021-01-05 MED ORDER — MIDAZOLAM HCL 2 MG/2ML IJ SOLN
INTRAMUSCULAR | Status: AC
  Filled 2021-01-05: qty 2

## 2021-01-05 MED ORDER — HEPARIN SODIUM (PORCINE) 1000 UNIT/ML IJ SOLN
INTRAMUSCULAR | Status: AC
  Filled 2021-01-05: qty 1

## 2021-01-05 MED ORDER — IOHEXOL 350 MG/ML SOLN
INTRAVENOUS | Status: DC | PRN
  Administered 2021-01-05: 50 mL

## 2021-01-05 MED ORDER — HEPARIN (PORCINE) IN NACL 1000-0.9 UT/500ML-% IV SOLN
INTRAVENOUS | Status: AC
  Filled 2021-01-05: qty 1000

## 2021-01-05 MED ORDER — VERAPAMIL HCL 2.5 MG/ML IV SOLN
INTRAVENOUS | Status: AC
  Filled 2021-01-05: qty 2

## 2021-01-05 MED ORDER — VERAPAMIL HCL 2.5 MG/ML IV SOLN
INTRAVENOUS | Status: DC | PRN
  Administered 2021-01-05: 10 mL via INTRA_ARTERIAL

## 2021-01-05 MED ORDER — ASPIRIN 81 MG PO CHEW
CHEWABLE_TABLET | ORAL | Status: AC
  Administered 2021-01-05: 81 mg
  Filled 2021-01-05: qty 1

## 2021-01-05 MED ORDER — ASPIRIN 81 MG PO CHEW: 81.0000 mg | CHEWABLE_TABLET | Freq: Once | ORAL | Status: DC

## 2021-01-05 MED ORDER — FUROSEMIDE 10 MG/ML IJ SOLN
INTRAMUSCULAR | Status: AC
  Filled 2021-01-05: qty 4

## 2021-01-05 MED ORDER — MIDAZOLAM HCL 2 MG/2ML IJ SOLN
INTRAMUSCULAR | Status: DC | PRN
  Administered 2021-01-05: 1 mg via INTRAVENOUS

## 2021-01-05 MED ORDER — FUROSEMIDE 10 MG/ML IJ SOLN
INTRAMUSCULAR | Status: DC | PRN
  Administered 2021-01-05: 80 mg via INTRAVENOUS

## 2021-01-05 MED ORDER — ACETAMINOPHEN 325 MG PO TABS: 650.0000 mg | ORAL_TABLET | ORAL | Status: DC | PRN

## 2021-01-05 MED ORDER — LIDOCAINE HCL (PF) 1 % IJ SOLN
INTRAMUSCULAR | Status: AC
  Filled 2021-01-05: qty 30

## 2021-01-05 SURGICAL SUPPLY — 10 items
CATH 5FR JL3.5 JR4 ANG PIG MP (CATHETERS) ×1 IMPLANT
CATH BALLN WEDGE 5F 110CM (CATHETERS) ×1 IMPLANT
CATH INFINITI 5 FR 3DRC (CATHETERS) ×1 IMPLANT
DEVICE RAD COMP TR BAND LRG (VASCULAR PRODUCTS) ×1 IMPLANT
GLIDESHEATH SLEND SS 6F .021 (SHEATH) ×1 IMPLANT
GUIDEWIRE INQWIRE 1.5J.035X260 (WIRE) IMPLANT
INQWIRE 1.5J .035X260CM (WIRE) ×2
PACK CARDIAC CATHETERIZATION (CUSTOM PROCEDURE TRAY) ×2 IMPLANT
SHEATH GLIDE SLENDER 4/5FR (SHEATH) ×1 IMPLANT
TRANSDUCER W/STOPCOCK (MISCELLANEOUS) ×2 IMPLANT

## 2021-01-05 NOTE — Discharge Instructions (Signed)
Radial Site Care  This sheet gives you information about how to care for yourself after your procedure. Your health care provider may also give you more specific instructions. If you have problems or questions, contact your health care provider. What can I expect after the procedure? After the procedure, it is common to have:  Bruising and tenderness at the catheter insertion area. Follow these instructions at home: Medicines  Take over-the-counter and prescription medicines only as told by your health care provider. Insertion site care  Follow instructions from your health care provider about how to take care of your insertion site. Make sure you: ? Wash your hands with soap and water before you change your bandage (dressing). If soap and water are not available, use hand sanitizer. ? Change your dressing as told by your health care provider. ? Leave stitches (sutures), skin glue, or adhesive strips in place. These skin closures may need to stay in place for 2 weeks or longer. If adhesive strip edges start to loosen and curl up, you may trim the loose edges. Do not remove adhesive strips completely unless your health care provider tells you to do that.  Check your insertion site every day for signs of infection. Check for: ? Redness, swelling, or pain. ? Fluid or blood. ? Pus or a bad smell. ? Warmth.  Do not take baths, swim, or use a hot tub until your health care provider approves.  You may shower 24-48 hours after the procedure, or as directed by your health care provider. ? Remove the dressing and gently wash the site with plain soap and water. ? Pat the area dry with a clean towel. ? Do not rub the site. That could cause bleeding.  Do not apply powder or lotion to the site. Activity  For 24 hours after the procedure, or as directed by your health care provider: ? Do not flex or bend the affected arm. ? Do not push or pull heavy objects with the affected arm. ? Do not drive  yourself home from the hospital or clinic. You may drive 24 hours after the procedure unless your health care provider tells you not to. ? Do not operate machinery or power tools.  Do not lift anything that is heavier than 10 lb (4.5 kg), or the limit that you are told, until your health care provider says that it is safe.  Ask your health care provider when it is okay to: ? Return to work or school. ? Resume usual physical activities or sports. ? Resume sexual activity.   General instructions  If the catheter site starts to bleed, raise your arm and put firm pressure on the site. If the bleeding does not stop, get help right away. This is a medical emergency.  If you went home on the same day as your procedure, a responsible adult should be with you for the first 24 hours after you arrive home.  Keep all follow-up visits as told by your health care provider. This is important. Contact a health care provider if:  You have a fever.  You have redness, swelling, or yellow drainage around your insertion site. Get help right away if:  You have unusual pain at the radial site.  The catheter insertion area swells very fast.  The insertion area is bleeding, and the bleeding does not stop when you hold steady pressure on the area.  Your arm or hand becomes pale, cool, tingly, or numb. These symptoms may represent a serious   problem that is an emergency. Do not wait to see if the symptoms will go away. Get medical help right away. Call your local emergency services (911 in the [place] Do not drive yourself to the hospital. Summary  After the procedure, it is common to have bruising and tenderness at the site.  Follow instructions from your health care provider about how to take care of your radial site wound. Check the wound every day for signs of infection.  Do not lift anything that is heavier than 10 lb (4.5 kg), or the limit that you are told, until your health care provider says that it  is safe. This information is not intended to replace advice given to you by your health care provider. Make sure you discuss any questions you have with your health care provider. Document Revised: 12/11/2017 Document Reviewed: 12/11/2017 Elsevier Patient Education  2021 [employer]  

## 2021-01-05 NOTE — Interval H&P Note (Signed)
History and Physical Interval Note:  01/05/2021 9:58 AM  Maria Foster  has presented today for surgery, with the diagnosis of heart failure.  The various methods of treatment have been discussed with the patient and family. After consideration of risks, benefits and other options for treatment, the patient has consented to  Procedure(s): RIGHT/LEFT HEART CATH AND CORONARY ANGIOGRAPHY (N/A) and possible coronary angioplasty as a surgical intervention.  The patient's history has been reviewed, patient examined, no change in status, stable for surgery.  I have reviewed the patient's chart and labs.  Questions were answered to the patient's satisfaction.     Edrei Norgaard

## 2021-01-11 ENCOUNTER — Other Ambulatory Visit: Payer: Self-pay

## 2021-01-11 ENCOUNTER — Ambulatory Visit (INDEPENDENT_AMBULATORY_CARE_PROVIDER_SITE_OTHER): Payer: Medicare Other | Admitting: Medical

## 2021-01-11 ENCOUNTER — Encounter: Payer: Self-pay | Admitting: Medical

## 2021-01-11 VITALS — BP 150/87 | HR 71 | Ht 63.0 in

## 2021-01-11 DIAGNOSIS — E11319 Type 2 diabetes mellitus with unspecified diabetic retinopathy without macular edema: Secondary | ICD-10-CM | POA: Diagnosis not present

## 2021-01-11 DIAGNOSIS — N179 Acute kidney failure, unspecified: Secondary | ICD-10-CM | POA: Diagnosis not present

## 2021-01-11 DIAGNOSIS — Z87891 Personal history of nicotine dependence: Secondary | ICD-10-CM | POA: Insufficient documentation

## 2021-01-11 DIAGNOSIS — B373 Candidiasis of vulva and vagina: Secondary | ICD-10-CM | POA: Diagnosis not present

## 2021-01-11 DIAGNOSIS — M1A9XX1 Chronic gout, unspecified, with tophus (tophi): Secondary | ICD-10-CM

## 2021-01-11 DIAGNOSIS — I5041 Acute combined systolic (congestive) and diastolic (congestive) heart failure: Secondary | ICD-10-CM

## 2021-01-11 DIAGNOSIS — I10 Essential (primary) hypertension: Secondary | ICD-10-CM | POA: Diagnosis not present

## 2021-01-11 DIAGNOSIS — J3489 Other specified disorders of nose and nasal sinuses: Secondary | ICD-10-CM | POA: Insufficient documentation

## 2021-01-11 DIAGNOSIS — R197 Diarrhea, unspecified: Secondary | ICD-10-CM | POA: Diagnosis not present

## 2021-01-11 DIAGNOSIS — B3731 Acute candidiasis of vulva and vagina: Secondary | ICD-10-CM | POA: Insufficient documentation

## 2021-01-11 DIAGNOSIS — M069 Rheumatoid arthritis, unspecified: Secondary | ICD-10-CM | POA: Insufficient documentation

## 2021-01-11 MED ORDER — FLUCONAZOLE 100 MG PO TABS: 100.0000 mg | ORAL_TABLET | Freq: Every day | ORAL | 0 refills | Status: DC

## 2021-01-11 NOTE — Progress Notes (Unsigned)
Subjective:  Maria Foster is a 65 y.o. female who presents for Chief Complaint  Patient presents with  . Hospitalization Follow-up     Here as a new patient to establish care.  Just recently had hospitalization for heart failure.  Upon discharge was advised to establish with primary care.  Admitted 12/18/20 - 12/23/20, [facility] for heart failure, new diagnosis, moderate to severe TR, acute kidney injury diabetes with retinopathy.  She was a diabetic prior to this hospitalization.    Just saw cardiology last week for hospital f/u.   No medications were changed last week.  Sees them again in march 2.    Been having some diarrhea and yeast infection.  Was started on a new medicaiton 2 weeks ago including Farxiga, thinks this could be giving her problems.   Just started with diarrhea yesterday, several times, maybe 6 times of diarrhea yesterday and several today so far.  No blood in stool.  No sick contacts other than hospital.  No recent travel.    Has some vaginal itching.  No discharge.  No vaginal bleeding.  No urinary issues.     Lives with husband.   Not working currently.    Does not have a glucometer.     Her daughter helps check her weights and BPs.   Still feels weak from hospitalization.   No new cough, congestion, sore throat.  Has slight runny nose.    In last few days sugars 123.    No other aggravating or relieving factors.    No other c/o.  Past Medical History:  Diagnosis Date  . Arthritis   . Cataract   . Diabetes mellitus type 2 with complications (HCC)    diagnosed probably 2012 per patient  . Diabetic retinopathy (HCC)   . Hypertension     The following portions of the patient's history were reviewed and updated as appropriate: allergies, current medications, past family history, past medical history, past social history, past surgical history and problem list.  ROS Otherwise as in subjective above    Objective: BP (!) 150/87   Pulse 71   Ht  5\' 3"  (1.6 m)   SpO2 96%   BMI 26.57 kg/m    General appearance: alert, no distress, well developed, well nourished, African American female HEENT: normocephalic, sclerae anicteric, conjunctiva pink and moist, TMs pearly, nares patent, no discharge or erythema, pharynx normal Oral cavity: MMM, no lesions Neck: supple, no lymphadenopathy, no thyromegaly, no masses Heart: 2/6 brief systolic murmur heart best in left upper sternal border, otherwise normal S1, S2 Lungs: CTA bilaterally, no wheezes, rhonchi, or rales Abdomen: +bs, soft, non tender, non distended, no masses, no hepatomegaly, no splenomegaly Bilateral index fingers with tophi raised nodules as well as bilateral elbows with tophi, bony arthritic changes noted of hands Pulses: 2+ radial pulses, 2+ pedal pulses, normal cap refill Ext: no edema Gyn exam declined/deferred, she did a self wet prep   Notes from Dr. Gala Romney heart cath 01/05/21: Assessment: 1. 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 80% lesion in mLCX 3. Mild non-obstructive CAD in LAD 4. 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 Treat medically for now. If develops anginal symptoms can consider PCI mLCX. Needs diuresis first. Give lasix 80 IV x1. Increase home lasix to 80 daily. F/u HF Clinic.    12/20/20 Echocardiogram:  IMPRESSIONS    1. Left ventricular ejection fraction, by estimation,  is 35 to 40%. The  left ventricle has moderately decreased function. The left ventricle  demonstrates global hypokinesis. Left ventricular diastolic parameters are  consistent with Grade II diastolic  dysfunction (pseudonormalization). Elevated left atrial pressure. There is  the interventricular septum is flattened in diastole ('D' shaped left  ventricle), consistent with right ventricular volume overload.  2. Right ventricular systolic function is mildly reduced. The right  ventricular  size is mildly enlarged. There is moderately elevated  pulmonary artery systolic pressure.  3. Left atrial size was moderately dilated.  4. 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  5. Tricuspid valve regurgitation is moderate to severe.  6. The aortic valve is tricuspid. Aortic valve regurgitation is mild to  moderate. No aortic stenosis is present.  7. The inferior vena cava is dilated in size with <50% respiratory  variability, suggesting right atrial pressure of 15 mmHg.      Assessment: Encounter Diagnoses  Name Primary?  . Acute combined systolic and diastolic heart failure (HCC) Yes  . AKI (acute kidney injury) (HCC)   . Benign essential HTN   . Type 2 diabetes mellitus with retinopathy of both eyes, without long-term current use of insulin, macular edema presence unspecified, unspecified retinopathy severity (HCC)   . Yeast vaginitis   . Diarrhea, unspecified type   . Former smoker   . Rhinorrhea   . Tophi   . Rheumatoid arthritis involving both hands, unspecified whether rheumatoid factor present [facility])      Plan: I reviewed her hospital discharge summary records.  Reconciled medications.  I reviewed her recent echocardiogram from February 1, catheterization records from February 17, recent labs including hemoglobin's A1c of 8.6% 3 weeks ago and other hospital records from her recent hospitalization  Current medicines include the following  Aspirin 81 mg daily  Farxiga 10 mg daily for heart failure and diabetes, recently started  Colace stool softener 100 mg twice daily as needed  Furosemide 40 mg daily  Imdur 60 mg daily  Hydralazine 50 mg every 8 hours  Toprol 50 mg XL daily  Potassium [place]lor-Con 20 mEq daily  Vitamin D 50,000 units weekly    Diabetes-diagnosed around 10 years ago or longer per patient.  She was on Metformin and Tradjenta before going to the hospital.  Metformin was discontinued due to acute kidney  injury.  They recommended basal insulin but she declined any injections for now.  She was begun on Farxiga and continued on Tradjenta.  I wrote her prescription for glucometer to start testing her home sugars.  Counseled on diet, compliance, and I will see her back in 1 to 2 weeks  Hypertension-not at goal today.  Continue current medication.  Follow-up with cardiology in 2 weeks as planned  Acute kidney injury-I reviewed the recent blood work done through cardiology.  Plan to recheck labs in 1 to 2 weeks  New diagnosis of heart failure, tricuspid regurgitation-she had recent catheterization, currently being managed by cardiology, she is doing daily weights, she is limiting salt and trying to make changes with lifestyle.  She has short follow-up with them in March in a few weeks  Yeast vaginitis-begin Diflucan.  Self wet prep showed yeast and hyphae.  We will have to continue to monitor things on Farxiga since this can put her at risk for yeast vaginitis  Diarrhea, rhinorrhea -she has 1 day history of diarrhea and slight runny nose.  We did Covid testing.  Rapid test negative.  I suspect more  of a stomach virus.  We discussed hydration, rest, avoid spicy or fried foods or foods that would make the diarrhea worse.  If any new changes such as blood in the stool, fever or feeling nauseated and body aches then call back by Friday in 2 days.  Former smoker  She has obvious tophi on exam.  Upon her next visit we can consider other eval and management for gout versus potential rheumatoid arthritis.  She notes a history of rheumatoid disease but I do not have a lot of records on her.  It does not appear that she has been on any DMARDs recently.  Maria Foster was seen today for hospitalization follow-up.  Diagnoses and all orders for this visit:  Acute combined systolic and diastolic heart failure (HCC)  AKI (acute kidney injury) (HCC)  Benign essential HTN  Type 2 diabetes mellitus with retinopathy of  both eyes, without long-term current use of insulin, macular edema presence unspecified, unspecified retinopathy severity (HCC)  Yeast vaginitis -     POCT Wet Prep ([place])  Diarrhea, unspecified type -     Cancel: Novel Coronavirus, NAA ([facility]) -     POC COVID-19 BinaxNow  Former smoker  Rhinorrhea -     Cancel: Novel Coronavirus, NAA ([facility]) -     POC COVID-19 BinaxNow  Tophi  Rheumatoid arthritis involving both hands, unspecified whether rheumatoid factor present (HCC)  Other orders -     fluconazole (DIFLUCAN) 100 MG tablet; Take 1 tablet (100 mg total) by mouth daily.   Spent > 45 minutes face to face with patient in discussion of symptoms, evaluation, plan and recommendations.    Follow up: 1-2 weeks

## 2021-01-12 LAB — POC COVID19 BINAXNOW: SARS Coronavirus 2 Ag: NEGATIVE

## 2021-01-12 LAB — POCT WET PREP (WET MOUNT)

## 2021-01-17 NOTE — Progress Notes (Signed)
[facility]  PCP: Fatima Sanger, FNP Cardiologist: Dr. Duke Salvia HF Cardiologist: Dr. Gala Romney  HPI: Maria Foster is a 65 y.o.  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     Primarily she was managed outpatient for HTN and T2DM. She had intermittent adherence to her amlodipine.       She was admitted 12/19/20 with increasing dyspnea and lower extremity edema for a month, also found to be hypertensive with AKI vs progression of CKD due to poorly controlled HTN and T2DM.  She was diuresed with IV lasix and cardiology was consulted.  Planned to perform [facility] but creatinine worsened. 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 Discharge weight was 144.5 lbs.  She was seen in the [facility] last week, [name] was added and scheduled for [facility].  Today she returns for HF follow up. Overall feeling fine. Denies increasing SOB, CP, dizziness, edema, or PND/Orthopnea. Appetite ok. No fever or chills. Weight at home 147-149 pounds. Taking lasix daily now.   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  Mid Cx lesion is 80% stenosed.  1st Mrg lesion is 100% stenosed.  Ramus lesion is 30% stenosed.  Mid LM to Dist LM lesion is 20% stenosed.  Prox LAD lesion is 30% stenosed.  Dist RCA lesion is 100% stenosed. Findings: Ao = 154.73 (104) LV = 143/20 RA =  13 RV = 65/15 PA = 60/24 (37)  PCW = 27 (v = 40) Fick cardiac output/index = 4.7/2.7 PVR = 2.1 WU SVR = 1552 Ao sat = 97% PA sat = 64%, 64% Assessment: 1. 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 80% lesion in mLCX 3. Mild non-obstructive CAD in LAD 4. 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 Treat medically for now. If develops anginal symptoms can consider PCI mLCX. Needs diuresis first.   ROS: All systems negative except as listed in HPI, PMH and Problem List.  SH:  Social History   Socioeconomic History  . Marital status: Married    Spouse name: Not on file  . Number of children: Not on file  . Years of education: Not on file  . Highest education level: Not on file  Occupational History  . Not on file  Tobacco Use  . Smoking status: Former Smoker    Packs/day: 0.50    Types: Cigarettes    Quit date: 11/19/2020    Years since quitting: 0.1  . Smokeless tobacco: Never Used  Vaping Use  . Vaping Use: Never used  Substance and Sexual Activity  . Alcohol use: Not Currently  . Drug use: No  . Sexual activity: Not on file  Other Topics Concern  . Not on file  Social History Narrative  . Not on file   Social Determinants of Health   Financial Resource Strain: Medium Risk  . Difficulty of Paying Living Expenses: Somewhat hard  Food Insecurity: No Food Insecurity  . Worried About Programme researcher, broadcasting/film/video in the Last Year: Never true  . Ran Out of Food in the Last Year: Never true  Transportation Needs: No Transportation  Needs  . Lack of Transportation (Medical): No  . Lack of Transportation (Non-Medical): No  Physical Activity: Not on file  Stress: Not on file  Social Connections: Not on file  Intimate Partner Violence: Not on file   FH:  Family History  Problem Relation Age of Onset  . Amblyopia Neg Hx   . Blindness Neg Hx   . Cataracts Neg Hx   . Glaucoma Neg Hx   . Macular degeneration Neg Hx   . Retinal detachment Neg Hx   . Strabismus Neg Hx   . Retinitis pigmentosa Neg Hx    Past Medical History:  Diagnosis Date  . Arthritis   . Cataract   . Diabetes mellitus type 2 with complications (HCC)    diagnosed probably 2012 per patient  . Diabetic  retinopathy (HCC)   . Hypertension    Current Outpatient Medications  Medication Sig Dispense Refill  . aspirin EC 81 MG tablet Take 81 mg by mouth every Monday, Wednesday, and Friday.    . dapagliflozin propanediol (FARXIGA) 10 MG TABS tablet Take 1 tablet (10 mg total) by mouth daily before breakfast. 30 tablet 6  . furosemide (LASIX) 40 MG tablet Take 40 mg by mouth daily.    . hydrALAZINE (APRESOLINE) 50 MG tablet Take 1 tablet (50 mg total) by mouth every 8 (eight) hours. 90 tablet 2  . isosorbide mononitrate (IMDUR) 60 MG 24 hr tablet Take 1 tablet (60 mg total) by mouth daily. 30 tablet 2  . linagliptin (TRADJENTA) 5 MG TABS tablet Take 1 tablet (5 mg total) by mouth daily. 30 tablet 1  . metoprolol succinate (TOPROL-XL) 50 MG 24 hr tablet Take 1 tablet (50 mg total) by mouth daily. Take with or immediately following a meal. 30 tablet 2  . potassium chloride SA (KLOR-CON) 20 MEQ tablet Take 20 mEq by mouth every other day.    . Vitamin D, Ergocalciferol, (DRISDOL) 1.25 MG (50000 UNIT) CAPS capsule Take 50,000 Units by mouth once a week.    . docusate sodium (COLACE) 100 MG capsule Take 100 mg by mouth daily as needed for mild constipation. (Patient not taking: Reported on 01/18/2021)     No current facility-administered medications for this encounter.   Vitals:   01/18/21 1129  BP: (!) 176/82  Pulse: 75  SpO2: 96%  Weight: 66 kg (145 lb 9.6 oz)   Wt Readings from Last 3 Encounters:  01/18/21 66 kg (145 lb 9.6 oz)  01/05/21 68 kg (150 lb)  12/28/20 68.2 kg (150 lb 4 oz)    PHYSICAL EXAM: General:  NAD. No resp difficulty HEENT: Normal Neck: Supple. No JVD, prominent V waves. Carotids 2+ bilat; no bruits. No lymphadenopathy or thryomegaly appreciated. Cor: PMI nondisplaced. Regular rate & rhythm. No rubs, gallops, III/VI TR. Lungs: Clear Abdomen: Soft, nontender, nondistended. No hepatosplenomegaly. No bruits or masses. Good bowel sounds. Extremities: No cyanosis, clubbing,  rash, trace pedal edema; scattered tophi on bilateral hands/arms. Neuro: Alert & oriented x 3, cranial nerves grossly intact. Moves all 4 extremities w/o difficulty. Affect pleasant.  ASSESSMENT & PLAN:  1. Combined Systolic and Diastolic CHF, Biventricular CHF  - Long standing poorly controlled HTN - Concurrent Valvular disease moderate MR and mod-severe TR - Echo (2/22): EF 35-40% with global HK, LVH and moderate RV dysfunction - NYHA Class I-II, volume good today, weight down 5 lbs. - Start spiro 12.5 mg daily. Stop KCl supplement. - Continue Toprol XL 50 mg daily. - Continue hydralazine 50  mg tid + Imdur 60 mg daily. - Continue Farxiga 10 mg daily. - Continue lasix 40 mg daily. - Consider switching hydral/imdur to [facility] next visit. - Continue compression hose for edema. - BMET today, repeat 7-10 days.  2. CAD  - R/LHC (2/22) 3v CAD with totally occluded RCA with L -> R collaterals, 80% lesion in mLCX, mild non-obs in LAD - On ASA. Needs statin. - No CP. - Lipids today.  3. 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 T2DM: - a1C 8.6 (2/22) - On tradjenta + Farxiga - Yeast infection 2 weeks ago, resolved with fluconazole; will follow, may need to stop [place].  5. CKD3a: - Baseline Scr~1.2-1.5 - BMET today.  6. Snoring - Will order sleep study.  7. HTN - Elevated today, but has not had morning meds yet. - Keep BP log and bring to next visit. - Add spiro as above. - May benefit from increasing hydral/Imdur vs adding/switching to [facility].  - Follow up in 3 weeks for further medication titration (consider increasing spiro or switching hydral/imdur to [facility]).  - Follow back in 6 weeks in APP clinic. Echo ~3 months after GDMT optimized.  Prince Rome, FNP-BC 01/18/21

## 2021-01-18 ENCOUNTER — Other Ambulatory Visit: Payer: Self-pay

## 2021-01-18 ENCOUNTER — Ambulatory Visit (HOSPITAL_COMMUNITY)
Admission: RE | Admit: 2021-01-18 | Discharge: 2021-01-18 | Disposition: A | Discharge status: Home / Self Care | Payer: Medicare Other | Source: Ambulatory Visit | Attending: Family Medicine | Admitting: Family Medicine

## 2021-01-18 ENCOUNTER — Encounter (HOSPITAL_COMMUNITY): Payer: Self-pay

## 2021-01-18 VITALS — BP 176/82 | HR 75 | Wt 145.6 lb

## 2021-01-18 DIAGNOSIS — I5042 Chronic combined systolic (congestive) and diastolic (congestive) heart failure: Secondary | ICD-10-CM | POA: Diagnosis not present

## 2021-01-18 DIAGNOSIS — E1136 Type 2 diabetes mellitus with diabetic cataract: Secondary | ICD-10-CM | POA: Diagnosis not present

## 2021-01-18 DIAGNOSIS — E1122 Type 2 diabetes mellitus with diabetic chronic kidney disease: Secondary | ICD-10-CM | POA: Diagnosis not present

## 2021-01-18 DIAGNOSIS — Z8742 Personal history of other diseases of the female genital tract: Secondary | ICD-10-CM | POA: Insufficient documentation

## 2021-01-18 DIAGNOSIS — I340 Nonrheumatic mitral (valve) insufficiency: Secondary | ICD-10-CM | POA: Diagnosis not present

## 2021-01-18 DIAGNOSIS — I2510 Atherosclerotic heart disease of native coronary artery without angina pectoris: Secondary | ICD-10-CM | POA: Diagnosis not present

## 2021-01-18 DIAGNOSIS — I2582 Chronic total occlusion of coronary artery: Secondary | ICD-10-CM | POA: Insufficient documentation

## 2021-01-18 DIAGNOSIS — Z7984 Long term (current) use of oral hypoglycemic drugs: Secondary | ICD-10-CM | POA: Diagnosis not present

## 2021-01-18 DIAGNOSIS — I071 Rheumatic tricuspid insufficiency: Secondary | ICD-10-CM | POA: Diagnosis not present

## 2021-01-18 DIAGNOSIS — Z596 Low income: Secondary | ICD-10-CM | POA: Diagnosis not present

## 2021-01-18 DIAGNOSIS — R0683 Snoring: Secondary | ICD-10-CM | POA: Diagnosis not present

## 2021-01-18 DIAGNOSIS — I10 Essential (primary) hypertension: Secondary | ICD-10-CM

## 2021-01-18 DIAGNOSIS — E11319 Type 2 diabetes mellitus with unspecified diabetic retinopathy without macular edema: Secondary | ICD-10-CM

## 2021-01-18 DIAGNOSIS — Z7982 Long term (current) use of aspirin: Secondary | ICD-10-CM | POA: Insufficient documentation

## 2021-01-18 DIAGNOSIS — N1831 Chronic kidney disease, stage 3a: Secondary | ICD-10-CM | POA: Diagnosis not present

## 2021-01-18 DIAGNOSIS — Z79899 Other long term (current) drug therapy: Secondary | ICD-10-CM | POA: Diagnosis not present

## 2021-01-18 DIAGNOSIS — E1165 Type 2 diabetes mellitus with hyperglycemia: Secondary | ICD-10-CM | POA: Insufficient documentation

## 2021-01-18 DIAGNOSIS — I130 Hypertensive heart and chronic kidney disease with heart failure and stage 1 through stage 4 chronic kidney disease, or unspecified chronic kidney disease: Secondary | ICD-10-CM | POA: Insufficient documentation

## 2021-01-18 DIAGNOSIS — Z87891 Personal history of nicotine dependence: Secondary | ICD-10-CM | POA: Insufficient documentation

## 2021-01-18 LAB — BASIC METABOLIC PANEL
Anion gap: 11 (ref 5–15)
BUN: 17 mg/dL (ref 8–23)
CO2: 24 mmol/L (ref 22–32)
Calcium: 8.7 mg/dL — ABNORMAL LOW (ref 8.9–10.3)
Chloride: 103 mmol/L (ref 98–111)
Creatinine, Ser: 1.23 mg/dL — ABNORMAL HIGH (ref 0.44–1.00)
GFR, Estimated: 49 mL/min — ABNORMAL LOW (ref >60)
Glucose, Bld: 200 mg/dL — ABNORMAL HIGH (ref 70–99)
Potassium: 3 mmol/L — ABNORMAL LOW (ref 3.5–5.1)
Sodium: 138 mmol/L (ref 135–145)

## 2021-01-18 LAB — LIPID PANEL
Cholesterol: 162 mg/dL (ref 0–200)
HDL: 45 mg/dL (ref >40)
LDL Cholesterol: 106 mg/dL — ABNORMAL HIGH (ref 0–99)
Total CHOL/HDL Ratio: 3.6 RATIO
Triglycerides: 54 mg/dL (ref <150)
VLDL: 11 mg/dL (ref 0–40)

## 2021-01-18 MED ORDER — SPIRONOLACTONE 25 MG PO TABS: 12.5000 mg | ORAL_TABLET | Freq: Every day | ORAL | 3 refills | Status: DC

## 2021-01-18 NOTE — Progress Notes (Signed)
Patient instructed and sent home with home sleep study device.  She understands and will await a call confirming insurance prior authorization.

## 2021-01-18 NOTE — Patient Instructions (Signed)
STOP POTASSIUM START Spironolactone 12.5 mg, one half tab daily at bedtime  Labs today We will only contact you if something comes back abnormal or we need to make some changes. Otherwise no news is good news!  Labs needed in 7-10 days  Your provider has recommended that you have a home sleep study.  We have provided you with the equipment in our office today. Please download the app and follow the instructions. YOUR PIN NUMBER IS: [id number]. Once you have completed the test you just dispose of the equipment, the information is automatically uploaded to [place] via blue-tooth technology. If your test is positive for sleep apnea and you need a home CPAP machine you will be contacted by Dr Norris Cross office [facility]) to set this up.  Your physician recommends that you schedule a follow-up appointment in: 3 weeks with the pharmacy team and in 6 weeks  in the Advanced Practitioners (PA/NP) Clinic   Do the following things EVERYDAY: 1) Weigh yourself in the morning before breakfast. Write it down and keep it in a log. 2) Take your medicines as prescribed 3) Eat low salt foods--Limit salt (sodium) to 2000 mg per day.  4) Stay as active as you can everyday 5) Limit all fluids for the day to less than 2 liters  At the [facility], you and your health needs are our priority. As part of our continuing mission to provide you with exceptional heart care, we have created designated Provider Care Teams. These Care Teams include your primary Cardiologist (physician) and Advanced Practice Providers (APPs- Physician Assistants and Nurse Practitioners) who all work together to provide you with the care you need, when you need it.   You may see any of the following providers on your designated Care Team at your next follow up: Marland Kitchen Dr Arvilla Meres . Dr Marca Ancona . Dr Thornell Mule . Tonye Becket, NP . Robbie Lis, PA . Shanda Bumps [place],NP . [name], PharmD   Please be sure to  bring in all your medications bottles to every appointment.    If you have any questions or concerns before your next appointment please send [place] a message through [place] or call our office at [phone].    TO LEAVE A MESSAGE FOR THE NURSE SELECT OPTION 2, PLEASE LEAVE A MESSAGE INCLUDING: . YOUR NAME . DATE OF BIRTH . CALL BACK NUMBER . REASON FOR CALL**this is important as we prioritize the call backs  YOU WILL RECEIVE A CALL BACK THE SAME DAY AS LONG AS YOU CALL BEFORE 4:00 PM  Please see our updated No Show and Same Day Appointment Cancellation Policy attached to your AVS.

## 2021-01-19 ENCOUNTER — Telehealth (HOSPITAL_COMMUNITY): Payer: Self-pay

## 2021-01-19 ENCOUNTER — Telehealth (HOSPITAL_COMMUNITY): Payer: Self-pay | Admitting: Surgery

## 2021-01-19 DIAGNOSIS — E11319 Type 2 diabetes mellitus with unspecified diabetic retinopathy without macular edema: Secondary | ICD-10-CM

## 2021-01-19 MED ORDER — ATORVASTATIN CALCIUM 40 MG PO TABS: 40.0000 mg | ORAL_TABLET | Freq: Every day | ORAL | 3 refills | Status: DC

## 2021-01-19 NOTE — Telephone Encounter (Signed)
Samara Snide, RN  01/19/2021 10:24 AM EST Back to Top     Patient advised and verbalized understanding. Med list updated. Labs will be drawn at follow up appointment. Lab orders placed

## 2021-01-19 NOTE — Addendum Note (Signed)
Encounter addended by: Theresia Bough, CMA on: 01/19/2021 4:24 PM  Actions taken: Clinical Note Signed

## 2021-01-19 NOTE — Progress Notes (Signed)
Patient Name: Maria Foster        DOB: 09-30-1956      Height: 5'3"     Weight: 145 lbs  Office Name:[facility]         Referring Provider:Daniel Bensihon, MD/ Prince Rome, NP  Today's Date:01/18/21   STOP BANG RISK ASSESSMENT S (snore) Have you been told that you snore?     NO   T (tired) Are you often tired, fatigued, or sleepy during the day?   NO  O (obstruction) Do you stop breathing, choke, or gasp during sleep? NO   P (pressure) Do you have or are you being treated for high blood pressure? YES   B (BMI) Is your body index greater than 35 kg/m? NO   A (age) Are you 65 years old or older? YES   N (neck) Do you have a neck circumference greater than 16 inches?   NO   G (gender) Are you a female? NO   TOTAL STOP/BANG "YES" ANSWERS                                                                        For Office Use Only              Procedure Order Form    YES to 3+ Stop Bang questions OR two clinical symptoms - patient qualifies for WatchPAT (CPT 95800)             Clinical Notes: Will consult Sleep Specialist and refer for management of therapy due to patient increased risk of Sleep Apnea. Ordering a sleep study due to the following two clinical symptoms: Excessive daytime sleepiness G47.10  Loud snoring R06.83 Unrefreshed by sleep G47.8  History of high blood pressure R03.0    I understand that I am proceeding with a home sleep apnea test as ordered by my treating physician. I understand that untreated sleep apnea is a serious cardiovascular risk factor and it is my responsibility to perform the test and seek management for sleep apnea. I will be contacted with the results and be managed for sleep apnea by a local sleep physician. I will be receiving equipment and further instructions from [facility]. I shall promptly ship back the equipment via the included mailing label. I understand my insurance will be billed for the test and as the patient I  am responsible for any insurance related out-of-pocket costs incurred. I have been provided with written instructions and can call for additional video or telephonic instruction, with 24-hour availability of qualified personnel to answer any questions: Patient Help Desk [phone].  Patient Signature ______________________________________________________   Date______________________ Patient Telemedicine Verbal Consent

## 2021-01-19 NOTE — Telephone Encounter (Signed)
-----   Message from Jacklynn Ganong, [place] sent at 01/18/2021  1:48 PM EST ----- LDL goal <70, her's is 106. Please start atorvastatin 40 mg q hs. Will recheck lipids in 6-8 weeks.

## 2021-01-19 NOTE — Telephone Encounter (Signed)
I called Ms Corne to instruct her to proceed with home sleep study.  Insurance prior authorization is not needed.  She acknowledges and tells me that she will complete within 48 hours.

## 2021-01-21 ENCOUNTER — Encounter (INDEPENDENT_AMBULATORY_CARE_PROVIDER_SITE_OTHER): Payer: Medicare Other | Admitting: Cardiology

## 2021-01-21 DIAGNOSIS — G4733 Obstructive sleep apnea (adult) (pediatric): Secondary | ICD-10-CM

## 2021-01-25 ENCOUNTER — Ambulatory Visit (HOSPITAL_COMMUNITY)
Admission: RE | Admit: 2021-01-25 | Discharge: 2021-01-25 | Disposition: A | Discharge status: Home / Self Care | Payer: Medicare Other | Source: Ambulatory Visit | Attending: Cardiology | Admitting: Cardiology

## 2021-01-25 ENCOUNTER — Other Ambulatory Visit: Payer: Self-pay

## 2021-01-25 ENCOUNTER — Ambulatory Visit: Payer: Medicare Other

## 2021-01-25 DIAGNOSIS — I5042 Chronic combined systolic (congestive) and diastolic (congestive) heart failure: Secondary | ICD-10-CM | POA: Diagnosis not present

## 2021-01-25 DIAGNOSIS — E11319 Type 2 diabetes mellitus with unspecified diabetic retinopathy without macular edema: Secondary | ICD-10-CM | POA: Diagnosis not present

## 2021-01-25 LAB — BASIC METABOLIC PANEL
Anion gap: 13 (ref 5–15)
BUN: 17 mg/dL (ref 8–23)
CO2: 22 mmol/L (ref 22–32)
Calcium: 8.9 mg/dL (ref 8.9–10.3)
Chloride: 103 mmol/L (ref 98–111)
Creatinine, Ser: 1.24 mg/dL — ABNORMAL HIGH (ref 0.44–1.00)
GFR, Estimated: 49 mL/min — ABNORMAL LOW (ref >60)
Glucose, Bld: 203 mg/dL — ABNORMAL HIGH (ref 70–99)
Potassium: 3.2 mmol/L — ABNORMAL LOW (ref 3.5–5.1)
Sodium: 138 mmol/L (ref 135–145)

## 2021-01-25 LAB — LIPID PANEL
Cholesterol: 129 mg/dL (ref 0–200)
HDL: 41 mg/dL (ref >40)
LDL Cholesterol: 78 mg/dL (ref 0–99)
Total CHOL/HDL Ratio: 3.1 RATIO
Triglycerides: 49 mg/dL (ref <150)
VLDL: 10 mg/dL (ref 0–40)

## 2021-01-25 NOTE — Procedures (Signed)
   Sleep Study Report  Patient Information Name: Maria Foster  ID: [id number] Birth Date: 12-Dec-1955  Age: 64  Gender: Female Study Date:01/21/2021 Referring Physician:  Marca Ancona, MD  TEST DESCRIPTION: Home sleep apnea testing was completed using the WatchPat, a Type 1 device, utilizing  peripheral arterial tonometry (PAT), chest movement, actigraphy, pulse oximetry, pulse rate, body position and snore.  AHI was calculated with apnea and hypopnea using valid sleep time as the denominator. RDI includes apneas,  hypopneas, and RERAs. The data acquired and the scoring of sleep and all associated events were performed in  accordance with the recommended standards and specifications as outlined in the AASM Manual for the Scoring of  Sleep and Associated Events 2.2.0 (2015).  FINDINGS: 1. Severe Obstructive Sleep Apnea with AHI 60/hr.  2. Moderate Central Sleep Apnea with pAHIc 26/hr. Cheyne Stokes respirations 42.1%. 3. Oxygen desaturations as low as 76%. 4. Moderate snoring was present. O2 sats were < 88% for 16.5min. 5. Total sleep time was 7 hrs and 10 min. 6. 19.7% of total sleep time was spent in REM sleep.  7. Normal sleep onset latency at 18 min.  8. Prolonged REM sleep onset latency at 114 min.  9. Total awakenings were 22.   DIAGNOSIS:  Severe Complex  Sleep Apnea (G47.33) Nocturnal Hypoxemia  RECOMMENDATIONS: 1. Clinical correlation of these findings is necessary. The decision to treat obstructive sleep apnea (OSA) is usually  based on the presence of apnea symptoms or the presence of associated medical conditions such as Hypertension,  Congestive Heart Failure, Atrial Fibrillation or Obesity. The most common symptoms of OSA are snoring, gasping for  breath while sleeping, daytime sleepiness and fatigue.   2. Initiating apnea therapy is recommended given the presence of symptoms and/or associated conditions.  Recommend proceeding with one of the following:   a.  Auto-CPAP therapy with a pressure range of 5-20cm H2O.   b. An oral appliance (OA) that can be obtained from certain dentists with expertise in sleep medicine. These are  primarily of use in non-obese patients with mild and moderate disease.   c. An ENT consultation which may be useful to look for specific causes of obstruction and possible treatment  Options.   d. If patient is intolerant to PAP therapy, consider referral to ENT for evaluation for hypoglossal nerve stimulator.   3. Close follow-up is necessary to ensure success with CPAP or oral appliance therapy for maximum benefit .  4. A follow-up oximetry study on CPAP is recommended to assess the adequacy of therapy and determine the need  for supplemental oxygen or the potential need for Bi-level therapy. An arterial blood gas to determine the adequacy of  baseline ventilation and oxygenation should also be considered.  5. Healthy sleep recommendations include: adequate nightly sleep (normal 7-9 hrs/night), avoidance of caffeine after  noon and alcohol near bedtime, and maintaining a sleep environment that is cool, dark and quiet.  6. Weight loss for overweight patients is recommended. Even modest amounts of weight loss can significantly  improve the severity of sleep apnea.  7. Snoring recommendations include: weight loss where appropriate, side sleeping, and avoidance of alcohol before  Bed.  8. Operation of motor vehicle or dangerous equipment must be avoided when feeling drowsy, excessively sleepy, or  mentally fatigued.  Report prepared by: Signature: Armanda Magic, MD [facility], Diplomat American Board of Sleep Medicine Electronically Signed: Mar 9, 202

## 2021-01-26 ENCOUNTER — Telehealth: Payer: Self-pay | Admitting: *Deleted

## 2021-01-26 DIAGNOSIS — G4733 Obstructive sleep apnea (adult) (pediatric): Secondary | ICD-10-CM

## 2021-01-26 NOTE — Telephone Encounter (Signed)
-----   Message from Quintella Reichert, MD sent at 01/25/2021  7:28 PM EST ----- Please let patient know that they have severe complex sleep apnea and recommend CPAP titration - needs to be done ASAP. Please set up titration in the sleep lab.

## 2021-01-26 NOTE — Telephone Encounter (Signed)
Informed patient of sleep study results and patient understanding was verbalized. Patient understands her sleep study showed  they have severe complex sleep apnea and recommend CPAP titration - needs to be done ASAP. Please set up titration in the sleep lab.   PER DPR Left detailed message on voicemail and informed patient to call back with questions

## 2021-01-27 ENCOUNTER — Encounter: Payer: Self-pay | Admitting: Medical

## 2021-01-27 ENCOUNTER — Ambulatory Visit (INDEPENDENT_AMBULATORY_CARE_PROVIDER_SITE_OTHER): Payer: Medicare Other | Admitting: Medical

## 2021-01-27 ENCOUNTER — Telehealth: Payer: Self-pay | Admitting: *Deleted

## 2021-01-27 ENCOUNTER — Other Ambulatory Visit: Payer: Self-pay

## 2021-01-27 VITALS — BP 174/80 | HR 67 | Ht 63.0 in | Wt 143.6 lb

## 2021-01-27 DIAGNOSIS — M1A9XX1 Chronic gout, unspecified, with tophus (tophi): Secondary | ICD-10-CM

## 2021-01-27 DIAGNOSIS — B373 Candidiasis of vulva and vagina: Secondary | ICD-10-CM

## 2021-01-27 DIAGNOSIS — M069 Rheumatoid arthritis, unspecified: Secondary | ICD-10-CM | POA: Diagnosis not present

## 2021-01-27 DIAGNOSIS — E11319 Type 2 diabetes mellitus with unspecified diabetic retinopathy without macular edema: Secondary | ICD-10-CM

## 2021-01-27 DIAGNOSIS — N179 Acute kidney failure, unspecified: Secondary | ICD-10-CM

## 2021-01-27 DIAGNOSIS — Z1211 Encounter for screening for malignant neoplasm of colon: Secondary | ICD-10-CM | POA: Insufficient documentation

## 2021-01-27 DIAGNOSIS — I5041 Acute combined systolic (congestive) and diastolic (congestive) heart failure: Secondary | ICD-10-CM | POA: Diagnosis not present

## 2021-01-27 DIAGNOSIS — D509 Iron deficiency anemia, unspecified: Secondary | ICD-10-CM

## 2021-01-27 DIAGNOSIS — I10 Essential (primary) hypertension: Secondary | ICD-10-CM

## 2021-01-27 DIAGNOSIS — B3731 Acute candidiasis of vulva and vagina: Secondary | ICD-10-CM

## 2021-01-27 DIAGNOSIS — Z87891 Personal history of nicotine dependence: Secondary | ICD-10-CM

## 2021-01-27 MED ORDER — FREESTYLE LIBRE 14 DAY SENSOR MISC: 1.0000 | 11 refills | Status: DC

## 2021-01-27 MED ORDER — FERROUS GLUCONATE 324 (38 FE) MG PO TABS: 324.0000 mg | ORAL_TABLET | Freq: Two times a day (BID) | ORAL | 2 refills | Status: DC

## 2021-01-27 NOTE — Patient Instructions (Signed)
Recommendations We are going to refer you for colonoscopy given the anemia and for screening  Begin potassium every day not just 3 days/week, also called Klor-Con  Anemia-begin iron/ferrous gluconate twice daily with food  Diabetes-continue [name] and Jearld Lesch, try and get the freestyle libre monitor covered by insurance so you can use this which will be much easier to check your sugars  Check your weights daily to make sure you are not gaining fluid weight  Continue rest of medicines as usual

## 2021-01-27 NOTE — Telephone Encounter (Signed)
Staff message sent to [place] ok to schedule CPAP titration. Per [facility] web portal no PA is required. Decision [id number].

## 2021-01-27 NOTE — Progress Notes (Signed)
Subjective:  Maria Foster is a 65 y.o. female who presents for Chief Complaint  Patient presents with  . Follow-up    Following up in systolic and diastolic heart failure- pt need colonoscopy/colon screening. Need to see eye doctor for diabetic eye exam-need mammogram done. Need yearly foot exam. Pt needs to return for a fasting physical      Here for recheck.  I saw her as a new patient 01/11/21 for hospital f/u.    History of recent heart failure, hospitalization, hypertension-seeing cardiology for this.  She is doing daily weights.  No fluctuation recently, no shortness of breath, no chest pain.  Compliant with medicines  Diabetes-her glucometer malfunction.  She needs to get a different glucometer.  She is compliant with Tradjenta and [place] without complaints.  She has had prior problems with gastric issues with Metformin and this was stopped with her recent hospitalization.  No polyuria or polydipsia no vision change.   Anemia per hospital records-she is not on iron therapy.  She denies blood in the stool.  No prior colonoscopy over  Hypokalemia on recent labs, she is taking Lasix and other medicines daily, but she has only been taking potassium every few days   Last visit we treated for yeast vaginitis which cleared up   No other aggravating or relieving factors.    No other c/o.  Past Medical History:  Diagnosis Date  . Arthritis   . Cataract   . Diabetes mellitus type 2 with complications ([place])    diagnosed probably 2012 per patient  . Diabetic retinopathy ([place])   . Hypertension    Current Outpatient Medications on File Prior to Visit  Medication Sig Dispense Refill  . aspirin EC 81 MG tablet Take 81 mg by mouth every Monday, Wednesday, and Friday.    Marland Kitchen atorvastatin (LIPITOR) 40 MG tablet Take 1 tablet (40 mg total) by mouth daily. 90 tablet 3  . dapagliflozin propanediol (FARXIGA) 10 MG TABS tablet Take 1 tablet (10 mg total) by mouth daily before breakfast. 30 tablet  6  . furosemide (LASIX) 40 MG tablet Take 40 mg by mouth daily.    . hydrALAZINE (APRESOLINE) 50 MG tablet Take 1 tablet (50 mg total) by mouth every 8 (eight) hours. 90 tablet 2  . isosorbide mononitrate (IMDUR) 60 MG 24 hr tablet Take 1 tablet (60 mg total) by mouth daily. 30 tablet 2  . linagliptin (TRADJENTA) 5 MG TABS tablet Take 1 tablet (5 mg total) by mouth daily. 30 tablet 1  . metoprolol succinate (TOPROL-XL) 50 MG 24 hr tablet Take 1 tablet (50 mg total) by mouth daily. Take with or immediately following a meal. 30 tablet 2  . potassium chloride SA (KLOR-CON) 20 MEQ tablet Take 20 mEq by mouth daily.    Marland Kitchen spironolactone (ALDACTONE) 25 MG tablet Take 0.5 tablets (12.5 mg total) by mouth at bedtime. 45 tablet 3  . Vitamin D, Ergocalciferol, (DRISDOL) 1.25 MG (50000 UNIT) CAPS capsule Take 50,000 Units by mouth once a week.    Marland Kitchen ACCU-CHEK GUIDE test strip See admin instructions.    . Accu-Chek Softclix Lancets lancets See admin instructions.    . Blood Glucose Monitoring Suppl (ACCU-CHEK GUIDE ME) w/Device KIT See admin instructions.    . docusate sodium (COLACE) 100 MG capsule Take 100 mg by mouth daily as needed for mild constipation. (Patient not taking: No sig reported)     No current facility-administered medications on file prior to visit.  The following portions of the patient's history were reviewed and updated as appropriate: allergies, current medications, past family history, past medical history, past social history, past surgical history and problem list.  ROS Otherwise as in subjective above    Objective: BP (!) 174/80   Pulse 67   Ht $R'5\' 3"'Ul$  (1.6 m)   Wt 143 lb 9.6 oz (65.1 kg)   SpO2 99%   BMI 25.44 kg/m    BP Readings from Last 3 Encounters:  01/27/21 (!) 174/80  01/18/21 (!) 176/82  01/11/21 (!) 150/87    General appearance: alert, no distress, well developed, well nourished, African American female Neck: supple, no lymphadenopathy, no thyromegaly,  no masses Heart: 2/6 brief systolic murmur heart best in left upper sternal border, otherwise normal S1, S2 Lungs: CTA bilaterally, no wheezes, rhonchi, or rales Bilateral index fingers with tophi raised nodules as well as bilateral elbows with tophi, bony arthritic changes noted of hands Pulses: 2+ radial pulses, 2+ pedal pulses, normal cap refill Ext: no edema    Assessment: Encounter Diagnoses  Name Primary?  . Type 2 diabetes mellitus with retinopathy of both eyes, without long-term current use of insulin, macular edema presence unspecified, unspecified retinopathy severity ([place]) Yes  . Rheumatoid arthritis involving both hands, unspecified whether rheumatoid factor present ([place])   . Benign essential HTN   . Acute combined systolic and diastolic heart failure ([place])   . AKI (acute kidney injury) ([place])   . Yeast vaginitis   . Former smoker   . Tophi   . Iron deficiency anemia, unspecified iron deficiency anemia type   . Screen for colon cancer      Plan: Diabetes-she had trouble with a glucometer malfunction.  We are going to look at freestyle libre since she really has difficulty sticking her fingers, needle phobia.  For now continue current medications Farxiga and Tradjenta.  Metformin was discontinued in the recent hospitalization due to acute kidney injury and she notes that she had lots of GI side effects of this medication.  Advised glucose monitoring, plan to follow-up in 1-3 months depending on sugar readings  Hypertension-not at goal, follow-up with cardiology who is managing her blood pressure  Heart failure-follow-up with cardiology  Yeast vaginitis resolved  Tophi-uric acid level today  Acute kidney injury on recent hospitalization-lab was just rechecked last week and was stable  Iron deficiency anemia-begin iron therapy, referral to GI for first colonoscopy  Hypokalemia-she has only been taking potassium every 2 days.  Advise she go ahead and change this to  daily given her recent low potassium reading and the fact that she is on Lasix daily  Rheumatoid arthritis history-I do not have any specific documentation of this diagnosis but it is mention in some other records so she may need to get in with rheumatoid specialist.  Sed rate level today.   Maria Foster was seen today for follow-up.  Diagnoses and all orders for this visit:  Type 2 diabetes mellitus with retinopathy of both eyes, without long-term current use of insulin, macular edema presence unspecified, unspecified retinopathy severity ([facility]) -     Insulin and C-Peptide  Rheumatoid arthritis involving both hands, unspecified whether rheumatoid factor present ([facility]) -     Uric acid -     Sedimentation rate  Benign essential HTN  Acute combined systolic and diastolic heart failure ([facility])  AKI (acute kidney injury) ([place])  Yeast vaginitis  Former smoker  Tophi -     Uric acid -  Sedimentation rate  Iron deficiency anemia, unspecified iron deficiency anemia type -     Ambulatory referral to Gastroenterology  Screen for colon cancer -     Ambulatory referral to Gastroenterology  Other orders -     ferrous gluconate (FERGON) 324 MG tablet; Take 1 tablet (324 mg total) by mouth 2 (two) times daily with a meal. -     Continuous Blood Gluc Sensor (FREESTYLE LIBRE 14 DAY SENSOR) MISC; 1 each by Does not apply route every 14 (fourteen) days.   F/u pending labs

## 2021-01-28 LAB — INSULIN AND C-PEPTIDE, SERUM
C-Peptide: 3.6 ng/mL (ref 1.1–4.4)
INSULIN: 7.6 u[IU]/mL (ref 2.6–24.9)

## 2021-01-28 LAB — URIC ACID: Uric Acid: 6.6 mg/dL (ref 3.0–7.2)

## 2021-01-28 LAB — SEDIMENTATION RATE: Sed Rate: 105 mm/hr — ABNORMAL HIGH (ref 0–40)

## 2021-02-01 ENCOUNTER — Other Ambulatory Visit: Payer: Self-pay

## 2021-02-01 DIAGNOSIS — M069 Rheumatoid arthritis, unspecified: Secondary | ICD-10-CM

## 2021-02-02 NOTE — Telephone Encounter (Signed)
Maria Foster, CMA  Reesa Chew, CMA Ok to schedule titration study. Per [facility] web portal no PA is required.

## 2021-02-02 NOTE — Telephone Encounter (Signed)
Patient is scheduled for CPAP Titration on 02/09/21. Patient understands her titration study will be done at [facility] sleep lab. Patient understands she will receive a letter in a week or so detailing appointment, date, time, and location. Patient understands to call if she does not receive the letter  in a timely manner.  PER DPR Left detailed message on voicemail and informed patient to call back with questions

## 2021-02-09 ENCOUNTER — Encounter (HOSPITAL_BASED_OUTPATIENT_CLINIC_OR_DEPARTMENT_OTHER): Payer: Medicare Other | Admitting: Cardiology

## 2021-02-13 NOTE — Progress Notes (Signed)
PCP: Fatima Sanger, FNP Cardiologist: Dr. Duke Salvia HF Cardiologist: Dr. Gala Romney  HPI:  Maria Foster is a 65 y.o. female with a PMH significant for T2DM, HTN, recently diagnosed combined systolic and diastolic CHF.      Primarily she was managed outpatient for HTN and T2DM. She had intermittent adherence to her amlodipine.       She was admitted 12/19/20 with increasing dyspnea and lower extremity edema for a month, also found to be hypertensive with AKI vs progression of CKD due to poorly controlled HTN and T2DM.  She was diuresed with IV furosemide and cardiology was consulted.  Planned to perform [facility] but creatinine worsened. 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 Discharge weight was 144.5 lbs.   She was seen in the [facility] 12/28/20, Maria Foster was added and [facility].   Recently presented to HF Clinic for follow up on 01/18/21. Overall was feeling fine. Denied increasing SOB, CP, dizziness, edema, or PND/Orthopnea. Appetite was ok. No fever or chills. Weight at home was 147-149 pounds. Reported taking furosemide daily.  Today she returns to HF clinic for pharmacist medication titration. At last visit with APP, spironolactone 12.5 mg daily was initiated and potassium supplement was discontinued. Additionally, atorvastatin 40 mg daily was initiated for LDL 106 (goal <70). Patient reports today that she has been taking her potassium supplement at 20 mEq daily (re-started at PCP visit on 01/27/21). However, she has not yet started spironolactone or gotten it filled because the prescription was mistakenly sent to [facility] instead of her usual pharmacy [facility].  Overall, patient is feeling well. She denies dizziness, lightheadedness and fatigue. No chest pain or palpitations. Patient has no issues with breathing. She is now able to walk up and down driveway to mailbox without SOB.  Reports home weight has been stable at around 144-145 lbs. Patient currently takes furosemide 40 mg daily and has not needed to take any extra doses. Patient reports some LEE only when she eats more salt than usual. She is aware of importance of low-salt diet and is trying to adhere, but husband sometimes buys groceries that do not have reduced salt. 1+ bilateral LEE on exam today. Patient reports eating vegetable soup last night, which was saltier than her usual diet. Denies PND/orthopnea.   HF Medications: Metoprolol succinate 50 mg daily Farxiga 10 mg daily Hydralazine 50 mg TID Isosorbide mononitrate 60 mg daily Furosemide 40 mg daily  Has the patient been experiencing any side effects to the medications prescribed?  no  Does the patient have any problems obtaining medications due to transportation or finances?   No - has [facility] Medicare with [place].  Understanding of regimen: fair Understanding of indications: fair Potential of compliance: good Patient understands to avoid NSAIDs. Patient understands to avoid decongestants.  Pertinent Lab Values: . 01/25/21: Scr 1.24, BUN 17, potassium 3.2, sodium 138  Vital Signs: . Weight: 146 lbs (last clinic weight: 145.6 lbs) . Blood pressure: 182/92  . Heart rate: 77  Assessment/Plan: 1. Combined Systolic and Diastolic CHF, Biventricular CHF  - Long standing poorly controlled HTN - Concurrent Valvular disease moderate MR and mod-severe TR - Echo (2/22): EF 35-40% with global HK, LVH and moderate RV dysfunction - NYHA Class I-II, +1 bilateral LEE on exam (ate soup high in sodium last night). Weight has been stable. - Decrease furosemide to 20 mg daily with initiation of Entresto.  Monitor volume status closely given she had some LEE on exam today.  - Continue metoprolol succinate 50 mg daily - Start Entresto 24/26 mg BID. Repeat BMET in 2 weeks. Monitor renal function closely with recent AKI. - Continue hydralazine 50 mg TID + Imdur 60 mg  daily - Continue Farxiga 10 mg daily. - Never started spironolactone as instructed last visit. Will hold off on initiation today given multiple medication changes and risk of confusion. Removed from medication list.  - Continue compression hose for edema - Pharmacy f/u visit scheduled 03/06/21. Recheck BMET then. Consider initiating spironolactone as well at next visit. - Patient will see APP on 5/3   2. CAD  - R/LHC (2/22) 3v CAD with totally occluded RCA with L -> R collaterals, 80% lesion in mLCX, mild non-obs in LAD - On ASA. - Continue atorvastatin 40 mg daily - No CP.   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 T2DM: - a1C 8.6 (2/22) - On tradjenta + Farxiga - Yeast infection on 01/11/21 - resolved    5. CKD3a: - Baseline Scr~1.2-1.5   6. Snoring - Sleep study previously ordered.   7. HTN - Elevated today. Reports taking metoprolol succinate and hydralazine this morning. - Keep BP log and bring to next visit. - Start Entresto 24/26 mg BID - Continue hydralazine 50 mg TID + Imdur 60 mg daily   [name] (PharmD Candidate 2022)  [name], PharmD, BCPS, BCCP, CPP Heart Failure Clinic Pharmacist [phone]

## 2021-02-21 ENCOUNTER — Ambulatory Visit (HOSPITAL_COMMUNITY)
Admission: RE | Admit: 2021-02-21 | Discharge: 2021-02-21 | Disposition: A | Discharge status: Home / Self Care | Payer: Medicare Other | Source: Ambulatory Visit | Attending: Cardiology | Admitting: Cardiology

## 2021-02-21 ENCOUNTER — Other Ambulatory Visit: Payer: Self-pay

## 2021-02-21 VITALS — BP 182/92 | HR 77 | Wt 146.2 lb

## 2021-02-21 DIAGNOSIS — E1122 Type 2 diabetes mellitus with diabetic chronic kidney disease: Secondary | ICD-10-CM | POA: Diagnosis not present

## 2021-02-21 DIAGNOSIS — I5042 Chronic combined systolic (congestive) and diastolic (congestive) heart failure: Secondary | ICD-10-CM | POA: Diagnosis not present

## 2021-02-21 DIAGNOSIS — I2582 Chronic total occlusion of coronary artery: Secondary | ICD-10-CM | POA: Diagnosis not present

## 2021-02-21 DIAGNOSIS — N1831 Chronic kidney disease, stage 3a: Secondary | ICD-10-CM | POA: Diagnosis not present

## 2021-02-21 DIAGNOSIS — Z7984 Long term (current) use of oral hypoglycemic drugs: Secondary | ICD-10-CM | POA: Insufficient documentation

## 2021-02-21 DIAGNOSIS — Z79899 Other long term (current) drug therapy: Secondary | ICD-10-CM | POA: Insufficient documentation

## 2021-02-21 DIAGNOSIS — I081 Rheumatic disorders of both mitral and tricuspid valves: Secondary | ICD-10-CM | POA: Diagnosis not present

## 2021-02-21 DIAGNOSIS — I2584 Coronary atherosclerosis due to calcified coronary lesion: Secondary | ICD-10-CM | POA: Diagnosis not present

## 2021-02-21 DIAGNOSIS — R0683 Snoring: Secondary | ICD-10-CM | POA: Diagnosis not present

## 2021-02-21 DIAGNOSIS — I2510 Atherosclerotic heart disease of native coronary artery without angina pectoris: Secondary | ICD-10-CM | POA: Diagnosis not present

## 2021-02-21 DIAGNOSIS — I130 Hypertensive heart and chronic kidney disease with heart failure and stage 1 through stage 4 chronic kidney disease, or unspecified chronic kidney disease: Secondary | ICD-10-CM | POA: Insufficient documentation

## 2021-02-21 DIAGNOSIS — I110 Hypertensive heart disease with heart failure: Secondary | ICD-10-CM | POA: Diagnosis present

## 2021-02-21 DIAGNOSIS — I5022 Chronic systolic (congestive) heart failure: Secondary | ICD-10-CM

## 2021-02-21 MED ORDER — FUROSEMIDE 40 MG PO TABS: 20.0000 mg | ORAL_TABLET | Freq: Every day | ORAL | 11 refills | Status: DC

## 2021-02-21 MED ORDER — ENTRESTO 24-26 MG PO TABS: 1.0000 | ORAL_TABLET | Freq: Two times a day (BID) | ORAL | 11 refills | Status: DC

## 2021-02-21 NOTE — Patient Instructions (Addendum)
It was a pleasure seeing you today!  MEDICATIONS: -We are changing your medications today -Decrease furosemide (Lasix) to 20 mg (1/2 tablet) daily -Start Entresto 24/26 mg (1 tablet) twice daily -Call if you have questions about your medications.  NEXT APPOINTMENT: Return to clinic in 2 weeks with Pharmacy Clinic.  In general, to take care of your heart failure: -Limit your fluid intake to 2 Liters (half-gallon) per day.   -Limit your salt intake to ideally 2-3 grams (2000-3000 mg) per day. -Weigh yourself daily and record, and bring that "weight diary" to your next appointment.  (Weight gain of 2-3 pounds in 1 day typically means fluid weight.) -The medications for your heart are to help your heart and help you live longer.   -Please contact us before stopping any of your heart medications.  Call the clinic at [phone] with questions or to reschedule future appointments.

## 2021-03-05 NOTE — Progress Notes (Incomplete)
***In Progress*** PNT:IRWE Katrinka Blazing, FNP Cardiologist:Dr. Duke Salvia HF Cardiologist: Dr. 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   Primarily she was managed outpatient for HTN and T2DM. She had intermittent adherence to her amlodipine.   She was admitted 12/19/20 with increasing dyspnea and lower extremity edema for a month, also found to be hypertensivewith AKIvs progression of CKD due to poorly controlled HTN and T2DM. She was diuresed with IV furosemide andcardiology was consulted.Plannedto perform R/LHC butcreatinine worsened. 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 Discharge weight was 144.5 lbs.  She was seen in the [facility] 12/28/20, [name] was added and scheduled for [facility].  She presented to HF Clinic for follow-up on 01/18/21. Spironolactone 12.5 mg daily was prescribed at that time. Atorvastatin 40 mg daily was also initiated for LDL 106 (goal <70).   Returned to HF Clinic for follow up on 02/21/21. Overall, patient was feeling well. Denied dizziness, lightheadedness, fatigue, chest pain, or palpitations. Patient had no issues with breathing. Was able to walk up and down driveway to mailbox without SOB. Reported home weight had been stable at around 144-145 lbs. Was taking furosemide 40 mg daily and had not needed any extra doses. Reported some LEE only when she ate more salt than usual. Was aware of importance of low-salt diet and trying to adhere, but husband sometimes buys groceries that do not have reduced salt. 1+ bilateral LEE on exam, which may have been due to eating vegetable soup the night before. Denies PND/orthopnea.  On 02/21/21, Entresto 24/26 mg BID was initiated and furosemide was decreased to 20 mg daily. Patient also reported she had not started  taking spironolactone prescribed on 01/18/21 because the prescription was mistakenly sent to [facility] instead of her usual pharmacy [facility]. Did not initiate spironolactone on 02/21/21 to avoid confusion with multiple medication changes.  Today she returns to HF clinic for pharmacist medication titration. ***     Overall feeling ***. Dizziness, lightheadedness, fatigue:  Chest pain or palpitations:  How is your breathing?: *** SOB: Able to complete all ADLs. Activity level ***  Weight at home pounds. Takes furosemide/torsemide/bumex *** mg *** daily.  LEE PND/Orthopnea  Appetite *** Low-salt diet:   Physical Exam Cost/affordability of meds    -BMET today (following Entresto initiation 4/5) -start spiro to 12.5 mg daily; keep KCl 20 mEq d/t low K on 3/9 -incr Entresto to 49/51 mg BID -incr metoprolol to 100 mg daily -f/u with APP on 5/3     HF Medications: Metoprolol succinate 50 mg daily Entresto 24/26 mg BID Farxiga 10 mg daily Hydralazine 50 mg TID Isosorbide mononitrate 60 mg daily Furosemide 20 mg daily  Has the patient been experiencing any side effects to the medications prescribed?  {YES NO:22349}  Does the patient have any problems obtaining medications due to transportation or finances?   {YES NO:22349}  Understanding of regimen: {excellent/good/fair/poor:19665} Understanding of indications: {excellent/good/fair/poor:19665} Potential of compliance: {excellent/good/fair/poor:19665} Patient understands to avoid NSAIDs. Patient understands to avoid decongestants.    Pertinent Lab Values: . 01/25/21: Serum creatinine 1.24, Potassium 3.2 . 03/06/21: ***  Vital Signs: . Weight: *** (last clinic weight: 146 lbs) . Blood pressure: ***  . Heart rate: ***   Assessment/Plan: 1.Combined Systolic and Diastolic CHF, Biventricular CHF  -Long standing poorly controlled HTN - Concurrent Valvular disease  moderate MR and mod-severe TR - Echo (2/22):EF 35-40% with  global HK, LVH and moderate RV dysfunction - NYHA Class I-II, +1 bilateral LEE on exam (ate soup high in sodium last night). Weight has been stable. - Continue furosemide to 20 mg daily. Monitor volume status closely given she had some LEE on exam today. *** - Continue metoprolol succinate 50 mg daily - Continue Entresto 24/26 mg BID. *** - Continue Farxiga 10 mg daily. - Continue hydralazine 50 mg TID + Imdur 60 mg daily - Continue compression hose for edema - Patient will see APP on 03/21/21  2. CAD  - R/LHC (2/22)3v CAD with totally occluded RCA with L -> R collaterals, 80% lesion in mLCX, mild non-obs in LAD - On ASA. - Continue atorvastatin 40 mg daily - No CP.  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 Snoring - Sleep study previously ordered.  7. HTN - Elevated today. Reports taking metoprolol succinate and hydralazine this morning. - Keep BP log and bring to next visit. - Continue Entresto 24/26 mg BID - Continue hydralazine 50 mg TID + Imdur 60 mg daily    [name] (PharmD Candidate 2022)  [name], PharmD, BCPS, BCCP, CPP Heart Failure Clinic Pharmacist [phone]

## 2021-03-06 ENCOUNTER — Ambulatory Visit (HOSPITAL_COMMUNITY)
Admission: RE | Admit: 2021-03-06 | Discharge: 2021-03-06 | Disposition: A | Discharge status: Home / Self Care | Payer: Medicare Other | Source: Ambulatory Visit | Attending: Internal Medicine | Admitting: Internal Medicine

## 2021-03-06 ENCOUNTER — Other Ambulatory Visit: Payer: Self-pay

## 2021-03-06 VITALS — BP 162/94

## 2021-03-06 DIAGNOSIS — I130 Hypertensive heart and chronic kidney disease with heart failure and stage 1 through stage 4 chronic kidney disease, or unspecified chronic kidney disease: Secondary | ICD-10-CM | POA: Insufficient documentation

## 2021-03-06 DIAGNOSIS — I5082 Biventricular heart failure: Secondary | ICD-10-CM | POA: Insufficient documentation

## 2021-03-06 DIAGNOSIS — I051 Rheumatic mitral insufficiency: Secondary | ICD-10-CM | POA: Insufficient documentation

## 2021-03-06 DIAGNOSIS — N1831 Chronic kidney disease, stage 3a: Secondary | ICD-10-CM | POA: Diagnosis not present

## 2021-03-06 DIAGNOSIS — I5041 Acute combined systolic (congestive) and diastolic (congestive) heart failure: Secondary | ICD-10-CM | POA: Diagnosis not present

## 2021-03-06 DIAGNOSIS — I2510 Atherosclerotic heart disease of native coronary artery without angina pectoris: Secondary | ICD-10-CM | POA: Insufficient documentation

## 2021-03-06 DIAGNOSIS — R0683 Snoring: Secondary | ICD-10-CM | POA: Diagnosis not present

## 2021-03-06 LAB — BASIC METABOLIC PANEL
Anion gap: 10 (ref 5–15)
BUN: 16 mg/dL (ref 8–23)
CO2: 24 mmol/L (ref 22–32)
Calcium: 9.2 mg/dL (ref 8.9–10.3)
Chloride: 106 mmol/L (ref 98–111)
Creatinine, Ser: 1.2 mg/dL — ABNORMAL HIGH (ref 0.44–1.00)
GFR, Estimated: 51 mL/min — ABNORMAL LOW (ref >60)
Glucose, Bld: 136 mg/dL — ABNORMAL HIGH (ref 70–99)
Potassium: 3.7 mmol/L (ref 3.5–5.1)
Sodium: 140 mmol/L (ref 135–145)

## 2021-03-06 MED ORDER — SPIRONOLACTONE 25 MG PO TABS: 12.5000 mg | ORAL_TABLET | Freq: Every day | ORAL | 11 refills | Status: DC

## 2021-03-06 MED ORDER — ISOSORBIDE MONONITRATE ER 60 MG PO TB24: 60.0000 mg | ORAL_TABLET | Freq: Every day | ORAL | 11 refills | Status: DC

## 2021-03-06 MED ORDER — ENTRESTO 49-51 MG PO TABS: 1.0000 | ORAL_TABLET | Freq: Two times a day (BID) | ORAL | 11 refills | Status: DC

## 2021-03-06 NOTE — Patient Instructions (Addendum)
It was a pleasure seeing you today!  MEDICATIONS: -We are changing your medications today -Increase Entresto to 49/51 mg (1 tablet) twice daily. You can take 2 tablets of your current supply twice daily until your current supply runs out. -Start spironolactone 12.5 mg (half-tablet) daily -Continue furosemide 20 mg (half-tablet) daily -Continue potassium 20 mEq (1 tablet) daily -Call if you have questions about your medications.  LABS: -We will call you if your labs need attention.  NEXT APPOINTMENT: Return to clinic in 2 weeks with APP Clinic.  In general, to take care of your heart failure: -Limit your fluid intake to 2 Liters (half-gallon) per day.   -Limit your salt intake to ideally 2-3 grams (2000-3000 mg) per day. -Weigh yourself daily and record, and bring that "weight diary" to your next appointment.  (Weight gain of 2-3 pounds in 1 day typically means fluid weight.) -The medications for your heart are to help your heart and help you live longer.   -Please contact us before stopping any of your heart medications.  Call the clinic at [phone] with questions or to reschedule future appointments.

## 2021-03-06 NOTE — Progress Notes (Signed)
TOI:ZTIW Katrinka Blazing, FNP Cardiologist:Dr. Duke Salvia HF Cardiologist: Dr. 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   Primarily she was managed outpatient for HTN and T2DM. She had intermittent adherence to her amlodipine.   She was admitted 12/19/20 with increasing dyspnea and lower extremity edema for a month, also found to be hypertensivewith AKIvs progression of CKD due to poorly controlled HTN and T2DM. She was diuresed with IV furosemide andcardiology was consulted.Plannedto perform R/LHC butcreatinine worsened. 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 Discharge weight was 144.5 lbs.  She was seen in the [facility] 12/28/20, [name] was added and scheduled for [facility].   She presented to HF Clinic for follow-up on 01/18/21. Spironolactone 12.5 mg daily was prescribed at that time. Atorvastatin 40 mg daily was also initiated after labs returned with LDL 106 (goal <70).   Returned to HF Clinic for follow up on 02/21/21. Overall, patient was feeling well. Denied dizziness, lightheadedness, fatigue, chest pain, or palpitations. Patient had no issues with breathing. Was able to walk up and down driveway to mailbox without SOB. Reported home weight had been stable at around 144-145 lbs. Was taking furosemide 40 mg daily and had not needed any extra doses. Reported some LEE only when she ate more salt than usual. Was aware of importance of low-salt diet and trying to adhere, but husband sometimes buys groceries that do not have reduced salt. 1+ bilateral LEE on exam, which may have been due to eating vegetable soup the night before. Denies PND/orthopnea.  On 02/21/21, Entresto 24/26 mg BID was initiated and furosemide was decreased to 20 mg daily. Patient also reported she had not started  taking spironolactone prescribed on 01/18/21 because the [facility] of [facility]. Did not initiate spironolactone on 02/21/21 to avoid confusion with multiple medication changes.  Today, she returns to HF clinic for pharmacist medication titration. Overall, she has been feeling about the same since last visit. Denies dizziness, lightheadedness, fatigue, chest pain, palpitations. Denies SOB, LEE, PND, orthopnea. Patient reports urinating more frequently and in larger quantities since starting Entresto. She has also lost a few pounds since last visit. Patient reports she has been out of isosorbide mononitrate and Tradjenta for the past week. Otherwise, she has been adherent to her medication regimen, including medication changes from last visit. She reports usually cooking for herself and uses Ms. Dash rather than adding salt. Patient is euvolemic on exam today.   HF Medications: Metoprolol succinate 50 mg daily Entresto 24/26 mg BID Farxiga 10 mg daily Hydralazine 50 mg TID Isosorbide mononitrate 60 mg daily Furosemide 20 mg daily  Has the patient been experiencing any side effects to the medications prescribed?  No   Does the patient have any problems obtaining medications due to transportation or finances?   No, has [facility] Medicare with [employer]. Sent in refill for isosorbide mononitrate today. Counseled patient to reach out to PCP for refill on Tradjenta.  Understanding of regimen: fair Understanding of indications: good Potential of compliance: good Patient understands to avoid NSAIDs. Patient understands to avoid decongestants.    Pertinent Lab Values: . 01/25/21: Serum creatinine 1.24, Potassium 3.2 . 03/06/21: Serum creatinine 1.2, Potassium 3.7, Sodium 140, BUN 16  Vital Signs: . Weight: 138 lbs (last clinic weight: 146 lbs) .  Blood pressure: 162/94 . Heart rate: 68   Assessment/Plan: 1.Combined Systolic and Diastolic CHF,  Biventricular CHF  -Long standing poorly controlled HTN - Concurrent Valvular disease moderate MR and mod-severe TR - Echo (2/22):EF 35-40% with global HK, LVH and moderate RV dysfunction - NYHA Class I-II, euvolemic on exam today. Weight has decreased 8 lbs since last visit. - Continue furosemide to 20 mg daily. - Continue KCL 20 mEq daily. Potassium improved to 3.7 today.  - Continue metoprolol succinate 50 mg daily - Increase Entresto to 49/51 mg BID. Serum creatinine stable at 1.2 today.  - Start spironolactone 12.5 mg daily.  - Continue Farxiga 10 mg daily. - Continue hydralazine 50 mg TID + isosorbide mononitrate 60 mg daily. Sent in refills for isosorbide mononitrate. - Continue compression hose for edema - Follow-up office visit and BMET on 03/21/21 with APP Clinic  2. CAD  - R/LHC (2/22)3v CAD with totally occluded RCA with L -> R collaterals, 80% lesion in mLCX, mild non-obs in LAD - On ASA. - Continue atorvastatin 40 mg daily - No CP.  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 Patient will reach out to PCP for Tradjenta refills. - Yeast infection on 01/11/21 - resolved  5.CKD3a: - Baseline Scr ~1.2-1.5. Stable at 1.2 today.  6. Snoring - Sleep study previously ordered.  7. HTN - Elevated today. - Keep BP log and bring to next visit. - Increase Entresto to 49/51 mg BID and start spironolactone 12.5 mg daily as stated above - Continue hydralazine and metoprolol as above.  Patient will follow up with APP in 2 weeks on 03/21/21.  [name] (PharmD Candidate 2022)  [name], PharmD, BCPS PGY2 Cardiology Pharmacy Resident  [name], PharmD, BCPS, [facility], CPP Heart Failure Clinic Pharmacist [phone]

## 2021-03-07 ENCOUNTER — Telehealth: Payer: Self-pay

## 2021-03-07 ENCOUNTER — Other Ambulatory Visit: Payer: Self-pay

## 2021-03-07 MED ORDER — LINAGLIPTIN 5 MG PO TABS: 5.0000 mg | ORAL_TABLET | Freq: Every day | ORAL | 1 refills | Status: DC

## 2021-03-07 NOTE — Telephone Encounter (Signed)
Pt left message needs refill on Tradjenta

## 2021-03-07 NOTE — Telephone Encounter (Signed)
Medication has been sent.  

## 2021-03-10 NOTE — Telephone Encounter (Signed)
done

## 2021-03-14 ENCOUNTER — Encounter (HOSPITAL_COMMUNITY): Payer: Medicare Other

## 2021-03-20 NOTE — Progress Notes (Signed)
Advanced [facility] Failure Clinic  PCP: Dr Elonda Husky  Cardiologist: Dr. Oval Linsey HF Cardiologist: Dr. Haroldine Laws  HPI: Maria Foster is a 65 y.o.  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     Primarily she was managed outpatient for HTN and T2DM. She had intermittent adherence to her amlodipine.       She was admitted 12/19/20 with increasing dyspnea and lower extremity edema for a month, also found to be hypertensive with AKI vs progression of CKD due to poorly controlled HTN and T2DM.  She was diuresed with IV lasix and cardiology was consulted.  Planned to perform [facility] but creatinine worsened. 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 Discharge weight was 144.5 lbs.  Today she returns for HF follow up.Overall feeling fine. Denies SOB/PND/Orthopnea. Says she is limited by joint pain related to gout. No chest pain. Appetite ok. No fever or chills. Weight at home has been stable. Taking all medications but ha not had entresto. Lives with her husband.   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  Mid Cx lesion is 80% stenosed.  1st Mrg lesion is 100% stenosed.  Ramus lesion is 30% stenosed.  Mid LM to Dist LM lesion is 20% stenosed.  Prox LAD lesion is 30% stenosed.  Dist RCA lesion is 100% stenosed. Findings: Ao = 154.73 (104) LV = 143/20 RA =  13 RV = 65/15 PA = 60/24 (37)  PCW = 27 (v = 40) Fick cardiac output/index = 4.7/2.7 PVR = 2.1 WU SVR = 1552 Ao sat = 97% PA sat = 64%, 64% Assessment: 1. 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 80% lesion in mLCX 3. Mild non-obstructive CAD in LAD 4. 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 Treat medically for now. If develops anginal symptoms can consider PCI mLCX. Needs diuresis first.   ROS: All systems negative except as listed in HPI, PMH and Problem List.  SH:  Social History   Socioeconomic History  . Marital status: Married    Spouse name: Not on file  . Number of children: Not on file  . Years of education: Not on file  . Highest education level: Not on file  Occupational History  . Not on file  Tobacco Use  . Smoking status: Former Smoker    Packs/day: 0.50    Types: Cigarettes    Quit date: 11/19/2020    Years since quitting: 0.3  . Smokeless tobacco: Never Used  Vaping Use  . Vaping Use: Never used  Substance and Sexual Activity  . Alcohol use: Not Currently  . Drug use: No  . Sexual activity: Not on file  Other Topics Concern  . Not on file  Social History Narrative  . Not on file   Social Determinants of Health   Financial Resource Strain: Medium Risk  . Difficulty of Paying Living Expenses: Somewhat hard  Food Insecurity: No Food Insecurity  . Worried About Charity fundraiser in the Last Year: Never true  . Ran Out of Food in the Last Year: Never true  Transportation Needs: No Transportation Needs  .  Lack of Transportation (Medical): No  . Lack of Transportation (Non-Medical): No  Physical Activity: Not on file  Stress: Not on file  Social Connections: Not on file  Intimate Partner Violence: Not on file   FH:  Family History  Problem Relation Age of Onset  . Amblyopia Neg Hx   . Blindness Neg Hx   . Cataracts Neg Hx   . Glaucoma Neg Hx   . Macular degeneration Neg Hx   . Retinal detachment Neg Hx   . Strabismus Neg Hx   . Retinitis pigmentosa Neg Hx    Past Medical History:  Diagnosis Date  . Arthritis   . Cataract   . Diabetes mellitus type 2 with complications ([place])    diagnosed probably 2012 per patient  . Diabetic retinopathy ([place])   . Hypertension     Current Outpatient Medications  Medication Sig Dispense Refill  . ACCU-CHEK GUIDE test strip See admin instructions.    . Accu-Chek Softclix Lancets lancets See admin instructions.    Marland Kitchen aspirin EC 81 MG tablet Take 81 mg by mouth every Monday, Wednesday, and Friday.    Marland Kitchen atorvastatin (LIPITOR) 40 MG tablet Take 1 tablet (40 mg total) by mouth daily. 90 tablet 3  . Blood Glucose Monitoring Suppl (ACCU-CHEK GUIDE ME) w/Device KIT See admin instructions.    . Continuous Blood Gluc Sensor (FREESTYLE LIBRE 14 DAY SENSOR) MISC 1 each by Does not apply route every 14 (fourteen) days. 2 each 11  . dapagliflozin propanediol (FARXIGA) 10 MG TABS tablet Take 1 tablet (10 mg total) by mouth daily before breakfast. 30 tablet 6  . docusate sodium (COLACE) 100 MG capsule Take 100 mg by mouth daily as needed for mild constipation.    . ferrous gluconate (FERGON) 324 MG tablet Take 1 tablet (324 mg total) by mouth 2 (two) times daily with a meal. 60 tablet 2  . furosemide (LASIX) 40 MG tablet Take 0.5 tablets (20 mg total) by mouth daily. 15 tablet 11  . hydrALAZINE (APRESOLINE) 50 MG tablet Take 1 tablet (50 mg total) by mouth every 8 (eight) hours. 90 tablet 2  . isosorbide mononitrate (IMDUR) 60 MG 24 hr tablet Take 1 tablet (60 mg total) by mouth daily. 30 tablet 11  . linagliptin (TRADJENTA) 5 MG TABS tablet Take 1 tablet (5 mg total) by mouth daily. 30 tablet 1  . metoprolol succinate (TOPROL-XL) 50 MG 24 hr tablet Take 1 tablet (50 mg total) by mouth daily. Take with or immediately following a meal. 30 tablet 2  . potassium chloride SA (KLOR-CON) 20 MEQ tablet Take 20 mEq by mouth daily.    . sacubitril-valsartan (ENTRESTO) 49-51 MG Take 1 tablet by mouth 2 (two) times daily. 60 tablet 11  . spironolactone (ALDACTONE) 25 MG tablet Take 0.5 tablets (12.5 mg total) by mouth daily. 15 tablet 11  . Vitamin D, Ergocalciferol, (DRISDOL) 1.25 MG (50000 UNIT) CAPS capsule Take 50,000 Units by mouth once a  week.     No current facility-administered medications for this encounter.   Vitals:   03/21/21 1110  BP: (!) 172/80  Pulse: 76  SpO2: 98%  Weight: 63 kg (138 lb 12.8 oz)   Wt Readings from Last 3 Encounters:  03/21/21 63 kg (138 lb 12.8 oz)  02/21/21 66.3 kg (146 lb 3.2 oz)  01/27/21 65.1 kg (143 lb 9.6 oz)    PHYSICAL EXAM: General:  Well appearing. No resp difficulty. Walked in the clinic.  HEENT: normal Neck:  supple. no JVD. Carotids 2+ bilat; no bruits. No lymphadenopathy or thryomegaly appreciated. Cor: PMI nondisplaced. Regular rate & rhythm. No rubs, gallops or murmurs. Lungs: clear Abdomen: soft, nontender, nondistended. No hepatosplenomegaly. No bruits or masses. Good bowel sounds. Extremities: no cyanosis, clubbing, rash, edema. LUE fingers with Tophi Neuro: alert & orientedx3, cranial nerves grossly intact. moves all 4 extremities w/o difficulty. Affect pleasant  ASSESSMENT & PLAN:  1. Combined Systolic and Diastolic CHF, Biventricular CHF  - Long standing poorly controlled HTN - Concurrent Valvular disease moderate MR and mod-severe TR - Echo (2/22): EF 35-40% with global HK, LVH and moderate RV dysfunction -NYHA II. Volume status stable.  - Increase spiro to 25 mg daily. BMET today and in 7 days.   - Continue Toprol XL 50 mg daily. - Continue hydralazine 50 mg tid + Imdur 60 mg daily. - Continue entresto 49-51 mg twice a day.  - Continue Farxiga 10 mg daily. - Continue lasix 40 mg daily. -Set up repeat ECHO   2. CAD  - R/LHC (2/22) 3v CAD with totally occluded RCA with L -> R collaterals, 80% lesion in mLCX, mild non-obs in LAD - No chest pain.  - On ASA.  - Continue atorvastatin 40 mg daily.   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   4. T2DM: - a1C 8.6 (2/22) - On tradjenta + Farxiga - - No recent yeast infections.  -Continue farxiga.   5. CKD3a: - Baseline Scr~1.2-1.5 - check BMET   6. Severe Sleep Apnea . -AHI 60/hr -Says  she is not comfortable with CPAP.  -Follows with Dr Radford Pax.    7. HTN -Elevated has not had entresto today.  -Continue current regimen and increase spiro.   Follow with Dr Haroldine Laws in 6-8 weeks with an ECHO.  Marland Kitchen     Nason Conradt NP-C   03/21/21

## 2021-03-21 ENCOUNTER — Other Ambulatory Visit: Payer: Self-pay

## 2021-03-21 ENCOUNTER — Ambulatory Visit (HOSPITAL_COMMUNITY)
Admission: RE | Admit: 2021-03-21 | Discharge: 2021-03-21 | Disposition: A | Discharge status: Home / Self Care | Payer: Medicare Other | Source: Ambulatory Visit | Attending: Adult Health | Admitting: Adult Health

## 2021-03-21 ENCOUNTER — Telehealth (HOSPITAL_COMMUNITY): Payer: Self-pay | Admitting: Cardiology

## 2021-03-21 ENCOUNTER — Encounter (HOSPITAL_COMMUNITY): Payer: Self-pay

## 2021-03-21 VITALS — BP 172/80 | HR 76 | Wt 138.8 lb

## 2021-03-21 DIAGNOSIS — Z7984 Long term (current) use of oral hypoglycemic drugs: Secondary | ICD-10-CM | POA: Diagnosis not present

## 2021-03-21 DIAGNOSIS — I081 Rheumatic disorders of both mitral and tricuspid valves: Secondary | ICD-10-CM | POA: Diagnosis not present

## 2021-03-21 DIAGNOSIS — E1122 Type 2 diabetes mellitus with diabetic chronic kidney disease: Secondary | ICD-10-CM | POA: Insufficient documentation

## 2021-03-21 DIAGNOSIS — G4730 Sleep apnea, unspecified: Secondary | ICD-10-CM | POA: Diagnosis not present

## 2021-03-21 DIAGNOSIS — I5041 Acute combined systolic (congestive) and diastolic (congestive) heart failure: Secondary | ICD-10-CM

## 2021-03-21 DIAGNOSIS — I2584 Coronary atherosclerosis due to calcified coronary lesion: Secondary | ICD-10-CM | POA: Diagnosis not present

## 2021-03-21 DIAGNOSIS — I5022 Chronic systolic (congestive) heart failure: Secondary | ICD-10-CM | POA: Diagnosis not present

## 2021-03-21 DIAGNOSIS — Z87891 Personal history of nicotine dependence: Secondary | ICD-10-CM | POA: Insufficient documentation

## 2021-03-21 DIAGNOSIS — G4733 Obstructive sleep apnea (adult) (pediatric): Secondary | ICD-10-CM

## 2021-03-21 DIAGNOSIS — I2510 Atherosclerotic heart disease of native coronary artery without angina pectoris: Secondary | ICD-10-CM

## 2021-03-21 DIAGNOSIS — Z79899 Other long term (current) drug therapy: Secondary | ICD-10-CM | POA: Diagnosis not present

## 2021-03-21 DIAGNOSIS — I130 Hypertensive heart and chronic kidney disease with heart failure and stage 1 through stage 4 chronic kidney disease, or unspecified chronic kidney disease: Secondary | ICD-10-CM | POA: Diagnosis not present

## 2021-03-21 DIAGNOSIS — E1165 Type 2 diabetes mellitus with hyperglycemia: Secondary | ICD-10-CM | POA: Diagnosis not present

## 2021-03-21 DIAGNOSIS — N1831 Chronic kidney disease, stage 3a: Secondary | ICD-10-CM

## 2021-03-21 DIAGNOSIS — I5082 Biventricular heart failure: Secondary | ICD-10-CM | POA: Diagnosis not present

## 2021-03-21 DIAGNOSIS — Z7982 Long term (current) use of aspirin: Secondary | ICD-10-CM | POA: Diagnosis not present

## 2021-03-21 DIAGNOSIS — Z596 Low income: Secondary | ICD-10-CM | POA: Insufficient documentation

## 2021-03-21 DIAGNOSIS — I2582 Chronic total occlusion of coronary artery: Secondary | ICD-10-CM | POA: Insufficient documentation

## 2021-03-21 DIAGNOSIS — I5042 Chronic combined systolic (congestive) and diastolic (congestive) heart failure: Secondary | ICD-10-CM | POA: Diagnosis not present

## 2021-03-21 LAB — BASIC METABOLIC PANEL
Anion gap: 7 (ref 5–15)
BUN: 6 mg/dL — ABNORMAL LOW (ref 8–23)
CO2: 26 mmol/L (ref 22–32)
Calcium: 8.7 mg/dL — ABNORMAL LOW (ref 8.9–10.3)
Chloride: 105 mmol/L (ref 98–111)
Creatinine, Ser: 0.9 mg/dL (ref 0.44–1.00)
GFR, Estimated: >60 mL/min (ref >60)
Glucose, Bld: 140 mg/dL — ABNORMAL HIGH (ref 70–99)
Potassium: 2.8 mmol/L — ABNORMAL LOW (ref 3.5–5.1)
Sodium: 138 mmol/L (ref 135–145)

## 2021-03-21 MED ORDER — SPIRONOLACTONE 25 MG PO TABS: 25.0000 mg | ORAL_TABLET | Freq: Every day | ORAL | 11 refills | Status: DC

## 2021-03-21 MED ORDER — POTASSIUM CHLORIDE CRYS ER 20 MEQ PO TBCR: 40.0000 meq | EXTENDED_RELEASE_TABLET | Freq: Two times a day (BID) | ORAL | 3 refills | Status: DC

## 2021-03-21 NOTE — Patient Instructions (Signed)
Labs today in one week. We will call with any abnormals  Increase Spironolactone 25 mg daily

## 2021-03-21 NOTE — Telephone Encounter (Signed)
Pt aware and voiced understanding 

## 2021-03-21 NOTE — Addendum Note (Signed)
Encounter addended by: Crissie Figures, RN on: 03/21/2021 12:04 PM  Actions taken: Order list changed, Diagnosis association updated

## 2021-03-21 NOTE — Telephone Encounter (Signed)
-----   Message from Sherald Hess, NP sent at 03/21/2021  2:11 PM EDT ----- K low. Please call increase 40 meq twice a day. Check BMEt in 7 days.

## 2021-03-28 ENCOUNTER — Other Ambulatory Visit: Payer: Self-pay

## 2021-03-28 ENCOUNTER — Ambulatory Visit (HOSPITAL_COMMUNITY)
Admission: RE | Admit: 2021-03-28 | Discharge: 2021-03-28 | Disposition: A | Discharge status: Home / Self Care | Payer: Medicare Other | Source: Ambulatory Visit | Attending: Cardiology | Admitting: Cardiology

## 2021-03-28 DIAGNOSIS — I5041 Acute combined systolic (congestive) and diastolic (congestive) heart failure: Secondary | ICD-10-CM | POA: Insufficient documentation

## 2021-03-28 LAB — BASIC METABOLIC PANEL
Anion gap: 6 (ref 5–15)
BUN: 17 mg/dL (ref 8–23)
CO2: 25 mmol/L (ref 22–32)
Calcium: 8.8 mg/dL — ABNORMAL LOW (ref 8.9–10.3)
Chloride: 106 mmol/L (ref 98–111)
Creatinine, Ser: 1.07 mg/dL — ABNORMAL HIGH (ref 0.44–1.00)
GFR, Estimated: 58 mL/min — ABNORMAL LOW (ref >60)
Glucose, Bld: 136 mg/dL — ABNORMAL HIGH (ref 70–99)
Potassium: 3.7 mmol/L (ref 3.5–5.1)
Sodium: 137 mmol/L (ref 135–145)

## 2021-03-29 ENCOUNTER — Ambulatory Visit: Payer: Medicare Other | Admitting: Internal Medicine

## 2021-03-29 ENCOUNTER — Ambulatory Visit: Payer: Self-pay

## 2021-03-29 ENCOUNTER — Encounter: Payer: Self-pay | Admitting: Internal Medicine

## 2021-03-29 ENCOUNTER — Ambulatory Visit (INDEPENDENT_AMBULATORY_CARE_PROVIDER_SITE_OTHER): Payer: Medicare Other

## 2021-03-29 VITALS — BP 166/70 | HR 66 | Ht 63.0 in | Wt 141.0 lb

## 2021-03-29 DIAGNOSIS — M79671 Pain in right foot: Secondary | ICD-10-CM

## 2021-03-29 DIAGNOSIS — M069 Rheumatoid arthritis, unspecified: Secondary | ICD-10-CM | POA: Diagnosis not present

## 2021-03-29 DIAGNOSIS — M79642 Pain in left hand: Secondary | ICD-10-CM | POA: Diagnosis not present

## 2021-03-29 DIAGNOSIS — Z79899 Other long term (current) drug therapy: Secondary | ICD-10-CM

## 2021-03-29 DIAGNOSIS — M79672 Pain in left foot: Secondary | ICD-10-CM

## 2021-03-29 DIAGNOSIS — M79641 Pain in right hand: Secondary | ICD-10-CM

## 2021-03-29 DIAGNOSIS — Z87891 Personal history of nicotine dependence: Secondary | ICD-10-CM

## 2021-03-29 DIAGNOSIS — M1A9XX1 Chronic gout, unspecified, with tophus (tophi): Secondary | ICD-10-CM

## 2021-03-29 NOTE — Progress Notes (Signed)
Office Visit Note  Patient: Maria Foster             Date of Birth: Jan 08, 1956           MRN: [id number]             PCP: Jac Canavan, PA-C Referring: Jac Canavan, PA-C Visit Date: 03/29/2021 Occupation: Retired day care work  Subjective:  New Patient (Initial Visit) (Patient has hx of RA, is not currently/has not been on treatment recently. Patient complains of occasional bilateral hand pain and bilateral knee pain- worse with rainy weather. )   History of Present Illness: Maria Foster is a 65 y.o. female referred here for evaluation of rheumatoid arthritis.  She has a longstanding history of bilateral hand and knee pain that has been going on for years with some progressive worsening and deformity over time.  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  She also has chronic knee pain and stiffness bilaterally without any large nodular changes.  She does not have much joint pain most days however experiences some episodically and also feels that her hands and knees hurt worse with cold and rainy weather.  She describes a history of rheumatoid arthritis but has never been on treatment plans for this.  She states she has never had any episode of gout involving the toes or feet but has been told by her cardiologist this is a problem that she has.  She does not recall ever undergoing bone density screening for osteoporosis. She has a long smoking history but in recent months is either not smoking or very reduced usage most days.  Activities of Daily Living:  Patient reports morning stiffness for 5-10 minutes.   Patient Denies nocturnal pain.  Difficulty dressing/grooming: Reports Difficulty climbing stairs: Reports Difficulty getting out of chair: Reports Difficulty using hands for taps,  buttons, cutlery, and/or writing: Reports  Review of Systems  Constitutional: Negative for fatigue.  HENT: Negative for mouth sores, mouth dryness and nose dryness.   Eyes: Negative for pain, itching, visual disturbance and dryness.  Respiratory: Negative for cough, hemoptysis, shortness of breath and difficulty breathing.   Cardiovascular: Positive for swelling in legs/feet. Negative for chest pain and palpitations.  Gastrointestinal: Negative for abdominal pain, blood in stool, constipation and diarrhea.  Endocrine: Negative for increased urination.  Genitourinary: Negative for painful urination.  Musculoskeletal: Positive for arthralgias, joint pain, joint swelling and morning stiffness. Negative for myalgias, muscle weakness, muscle tenderness and myalgias.  Skin: Negative for color change, rash and redness.  Allergic/Immunologic: Negative for susceptible to infections.  Neurological: Negative for dizziness, numbness, headaches, memory loss and weakness.  Hematological: Negative for swollen glands.  Psychiatric/Behavioral: Negative for confusion and sleep disturbance.    PMFS History:  Patient Active Problem List   Diagnosis Date Noted  . High risk medication use 03/29/2021  . Iron deficiency anemia 01/27/2021  . Screen for colon cancer 01/27/2021  . Yeast vaginitis 01/11/2021  . Diarrhea 01/11/2021  . Former smoker 01/11/2021  . Rhinorrhea 01/11/2021  . Tophi 01/11/2021  . Rheumatoid arthritis involving both hands (HCC) 01/11/2021  . Elevated troponin   . Acute combined systolic and diastolic heart failure (HCC)   . Benign essential HTN 12/19/2020  . Diabetes (HCC) 12/19/2020  . Constipation 12/19/2020  .  AKI (acute kidney injury) (HCC) 12/19/2020    Past Medical History:  Diagnosis Date  . Arthritis   . Cataract   . Diabetes mellitus type 2 with complications (HCC)    diagnosed probably 2012 per patient  . Diabetic retinopathy (HCC)   . Hypertension     Family  History  Problem Relation Age of Onset  . Diabetes Mother   . Hypertension Mother   . Heart Problems Sister   . Amblyopia Neg Hx   . Blindness Neg Hx   . Cataracts Neg Hx   . Glaucoma Neg Hx   . Macular degeneration Neg Hx   . Retinal detachment Neg Hx   . Strabismus Neg Hx   . Retinitis pigmentosa Neg Hx    Past Surgical History:  Procedure Laterality Date  . PRP Bilateral OD - 07/23/2017 OS - 08/06/2017  . RIGHT/LEFT HEART CATH AND CORONARY ANGIOGRAPHY N/A 01/05/2021   Procedure: RIGHT/LEFT HEART CATH AND CORONARY ANGIOGRAPHY;  Surgeon: Dolores Patty, MD;  Location: [facility];  Service: Cardiovascular;  Laterality: N/A;   Social History   Social History Narrative  . Not on file   Immunization History  Administered Date(s) Administered  . PFIZER(Purple Top)SARS-COV-2 Vaccination 02/09/2020, 03/01/2020, 11/05/2020     Objective: Vital Signs: BP (!) 166/70 (BP Location: Right Arm, Patient Position: Sitting, Cuff Size: Normal)   Pulse 66   Ht 5\' 3"  (1.6 m)   Wt 141 lb (64 kg)   BMI 24.98 kg/m    Physical Exam HENT:     Right Ear: External ear normal.     Left Ear: External ear normal.     Mouth/Throat:     Mouth: Mucous membranes are moist.     Pharynx: Oropharynx is clear.  Eyes:     Conjunctiva/sclera: Conjunctivae normal.  Cardiovascular:     Rate and Rhythm: Normal rate and regular rhythm.     Comments: Loud S2 heart sound Pulmonary:     Effort: Pulmonary effort is normal.     Breath sounds: Normal breath sounds.  Skin:    General: Skin is warm and dry.     Findings: No rash.     Comments: Trace pedal pitting edema Firm nodule or skin changes right anterior shin  Neurological:     General: No focal deficit present.     Mental Status: She is alert.  Psychiatric:        Mood and Affect: Mood normal.     Musculoskeletal Exam:  Large painless firm nodules in forearm extensor surfaces, dorsum of wrists, over MCP, PIP, and DIP joints of both  hands, no palpable synovitis in elbows, wrists, or fingers and normal ROM except in several finger joints with deviation and bony nodule changes No paraspinal tenderness to palpation over upper and lower back Hip normal internal and external rotation without pain, no tenderness to lateral hip palpation Knees full ROM no tenderness or swelling, bilateral patellofemoral crepitus Ankles full ROM no tenderness or swelling MTPs 1st digits decreased ROM rest appear normal   Investigation: No additional findings.  Imaging: XR Foot 2 Views Left  Result Date: 03/29/2021 X-ray left foot 2 views Tibiotalar joint space appears normal.  Plantar and posterior calcaneal enthesophytes are present.  Small dorsal midfoot joint osteophytes are present.  Erosive and cystic lesions are seen in the MTP and IP joints worst in the first IP joint. 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  Posterior and plantar calcaneal enthesophytes are present.  Midfoot degenerative arthritis with dorsal osteophytes at multiple levels.  Erosive joint changes present at MTPs and IP's most severe at the first IP joint and fifth MTP. Impression Moderate degenerative arthritis change in the hindfoot with chronic erosive gout changes in MTP and IP joint  XR Hand 2 View Left  Result Date: 03/29/2021 X-ray left hand 2 views Extensive erosions and cystic changes throughout the distal radius distal ulna and carpal bones.  There are large mildly hyperdense soft tissue swellings around the wrist.  Extensive erosive damage throughout the hand joints especially second and fifth MCPs and DIP joint involvements with significant subluxation with erosions.  Severe osteopenia is present. Impression Very extensive, tophaceous, erosive gouty arthritis throughout the  hand joints  XR Hand 2 View Right  Result Date: 03/29/2021 X-ray right hand 2 views Radiocarpal and carpal joints with extensive erosive and cystic changes including overhanging edge erosions.  There are numerous areas of adjacent soft tissue swelling.  Severe well demarcated erosive lesions in MCP, PIP, and DIP joints.  Severe diffuse osteopenia is present. 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  Procedures:  No procedures performed Allergies: Metformin and related   Assessment / Plan:     Visit Diagnoses: Rheumatoid arthritis involving both hands, unspecified whether rheumatoid factor present (HCC) Tophi  History of RA reported but exam looks most consistent with an extensive tophaceous chronic gout rather than rheumatoid nodules.  We will check rheumatoid factor and CCP antibody test.  Checking bilateral hand and feet x-rays for evidence of erosive disease changes.  Check uric acid level for gout.  We will need to follow-up discussed treatment options based on these findings.  Former smoker  She is working smoking reduction and/or cessation.  We discussed relationship between cigarette smoking and arthritis.  High risk medication use - Plan: Hepatitis B core antibody, IgM, Hepatitis B surface antigen, Hepatitis C antibody  Anticipating need to start maintenance medication given very chronic joint changes  seen on exam.  We will check hepatitis screen today considering possible rheumatoid arthritis treatment.  She has had recent CBC and CMP look okay.  Orders: Orders Placed This Encounter  Procedures  . XR Hand 2 View Right  . XR Hand 2 View Left  . XR Foot 2 Views Right  . XR Foot 2 Views Left  . Rheumatoid factor  . Cyclic citrul peptide antibody, IgG  . Uric acid  . Hepatitis B core antibody, IgM  . Hepatitis B surface antigen  . Hepatitis C antibody   No orders of the defined types were placed in this encounter.    Follow-Up Instructions: Return in about  2 weeks (around 04/12/2021) for New pt f/u RA/gout.   Fuller Plan, MD  Note - This record has been created using [employer].  Chart creation errors have been sought, but may not always  have been located. Such creation errors do not reflect on  the standard of medical care.

## 2021-03-30 LAB — RHEUMATOID FACTOR: Rheumatoid fact SerPl-aCnc: >1000 IU/mL — ABNORMAL HIGH (ref <14)

## 2021-03-30 LAB — HEPATITIS B CORE ANTIBODY, IGM: Hep B C IgM: NONREACTIVE

## 2021-03-30 LAB — HEPATITIS C ANTIBODY
Hepatitis C Ab: NONREACTIVE
SIGNAL TO CUT-OFF: 0.05 (ref <1.00)

## 2021-03-30 LAB — HEPATITIS B SURFACE ANTIGEN: Hepatitis B Surface Ag: NONREACTIVE

## 2021-03-30 LAB — URIC ACID: Uric Acid, Serum: 5.2 mg/dL (ref 2.5–7.0)

## 2021-03-30 LAB — CYCLIC CITRUL PEPTIDE ANTIBODY, IGG: Cyclic Citrullin Peptide Ab: >250 UNITS — ABNORMAL HIGH

## 2021-03-30 NOTE — Progress Notes (Signed)
Maria Foster hand xrays look most like damage from maybe gout arthritis but her blood tests are extremely positive for the rheumatoid arthritis antibodies. At her follow up visit I would like to try aspirating any inflamed joint or possible  a large nodule to better confirm the diagnosis. Her screening test for hepatitis is negative so no problems if we need to stat RA medicines.

## 2021-03-31 ENCOUNTER — Telehealth: Payer: Self-pay

## 2021-03-31 ENCOUNTER — Other Ambulatory Visit: Payer: Self-pay | Admitting: Medical

## 2021-03-31 MED ORDER — METOPROLOL SUCCINATE ER 50 MG PO TB24: 50.0000 mg | ORAL_TABLET | Freq: Every day | ORAL | 2 refills | Status: DC

## 2021-03-31 NOTE — Telephone Encounter (Signed)
Pt left message needs refill Metoprolol 50mg 

## 2021-03-31 NOTE — Telephone Encounter (Signed)
done

## 2021-04-01 ENCOUNTER — Telehealth: Payer: Self-pay

## 2021-04-01 NOTE — Telephone Encounter (Signed)
P.A. FREESTYLE LIBRE  

## 2021-04-18 ENCOUNTER — Ambulatory Visit: Payer: Medicare Other | Admitting: Internal Medicine

## 2021-05-04 ENCOUNTER — Encounter: Payer: Self-pay | Admitting: Medical

## 2021-05-04 ENCOUNTER — Ambulatory Visit (INDEPENDENT_AMBULATORY_CARE_PROVIDER_SITE_OTHER): Payer: Medicare Other | Admitting: Medical

## 2021-05-04 ENCOUNTER — Other Ambulatory Visit: Payer: Self-pay

## 2021-05-04 VITALS — BP 176/80 | HR 67 | Ht 63.0 in | Wt 134.4 lb

## 2021-05-04 DIAGNOSIS — N898 Other specified noninflammatory disorders of vagina: Secondary | ICD-10-CM | POA: Diagnosis not present

## 2021-05-04 DIAGNOSIS — I10 Essential (primary) hypertension: Secondary | ICD-10-CM | POA: Insufficient documentation

## 2021-05-04 DIAGNOSIS — Z720 Tobacco use: Secondary | ICD-10-CM | POA: Insufficient documentation

## 2021-05-04 DIAGNOSIS — Z79899 Other long term (current) drug therapy: Secondary | ICD-10-CM

## 2021-05-04 DIAGNOSIS — I208 Other forms of angina pectoris: Secondary | ICD-10-CM | POA: Insufficient documentation

## 2021-05-04 DIAGNOSIS — R634 Abnormal weight loss: Secondary | ICD-10-CM | POA: Insufficient documentation

## 2021-05-04 DIAGNOSIS — Z1231 Encounter for screening mammogram for malignant neoplasm of breast: Secondary | ICD-10-CM | POA: Insufficient documentation

## 2021-05-04 DIAGNOSIS — Z1211 Encounter for screening for malignant neoplasm of colon: Secondary | ICD-10-CM

## 2021-05-04 DIAGNOSIS — Z23 Encounter for immunization: Secondary | ICD-10-CM | POA: Diagnosis not present

## 2021-05-04 DIAGNOSIS — D509 Iron deficiency anemia, unspecified: Secondary | ICD-10-CM | POA: Diagnosis not present

## 2021-05-04 DIAGNOSIS — I509 Heart failure, unspecified: Secondary | ICD-10-CM | POA: Insufficient documentation

## 2021-05-04 DIAGNOSIS — Q845 Enlarged and hypertrophic nails: Secondary | ICD-10-CM

## 2021-05-04 DIAGNOSIS — E11319 Type 2 diabetes mellitus with unspecified diabetic retinopathy without macular edema: Secondary | ICD-10-CM

## 2021-05-04 DIAGNOSIS — E1169 Type 2 diabetes mellitus with other specified complication: Secondary | ICD-10-CM

## 2021-05-04 DIAGNOSIS — I5040 Unspecified combined systolic (congestive) and diastolic (congestive) heart failure: Secondary | ICD-10-CM | POA: Insufficient documentation

## 2021-05-04 DIAGNOSIS — Z7185 Encounter for immunization safety counseling: Secondary | ICD-10-CM

## 2021-05-04 DIAGNOSIS — E785 Hyperlipidemia, unspecified: Secondary | ICD-10-CM

## 2021-05-04 DIAGNOSIS — M069 Rheumatoid arthritis, unspecified: Secondary | ICD-10-CM

## 2021-05-04 LAB — POCT URINALYSIS DIP (PROADVANTAGE DEVICE)
Bilirubin, UA: NEGATIVE
Blood, UA: NEGATIVE
Glucose, UA: >=1000 mg/dL — AB
Ketones, POC UA: NEGATIVE mg/dL
Leukocytes, UA: NEGATIVE
Nitrite, UA: NEGATIVE
Specific Gravity, Urine: 1.015
Urobilinogen, Ur: 0.2
pH, UA: 6 (ref 5.0–8.0)

## 2021-05-04 MED ORDER — FLUCONAZOLE 150 MG PO TABS: 150.0000 mg | ORAL_TABLET | Freq: Once | ORAL | 0 refills | Status: AC

## 2021-05-04 NOTE — Progress Notes (Signed)
Subjective:  Maria Foster is a 65 y.o. female who presents for Chief Complaint  Patient presents with   Follow-up    Has vaginal itching after increasing spironolactone to 1 pill daily. No problems when she was taking 1/2 pill     Medical team: Dr. Vernelle Emerald, rheumatology Dr. Glori Bickers, cardiology Dr. Bernarda Caffey, retinal specialist Dr. Warden Fillers, ophthalmology Dr. Carol Ada, gastroenterology     Concerns: Here today for med check and follow-up on chronic issues  Maria Foster has a history of diabetes, rheumatoid arthritis, hypertension, history of heart failure, former smoker, and history of iron deficiency anemia.  Diabetes Currently taking Tradjenta 5 mg daily, Farxiga 10 mg daily.  Glucose readings recently <130.  Checks a few mornings per week.   No foot lesions  Insurance did not approve freestyle libre, but is checking glucose with her other glucometer.  Hyperlipidemia Compliant with Lipitor 40 mg daily and aspirin 81 mg MWF  Hypertension and heart failure managed by cardiology  She is taking vitamin D supplement 50,000 units weekly  Iron deficiency anemia She continues on Fergon 324 mg BID  Rheumatoid arthritis We referred to rheumatology and she recently establish care.  Has follow up with rheumatology soon.  Not currently on medication.   She notes some vaginal itching  Concerned about weight loss.  Prior weights was 150lb.  Last time at this weight maybe was 2018.  No fever, no night sweats.    She has colonoscopy consult coming up soon.  No other aggravating or relieving factors.    No other c/o.  Past Medical History:  Diagnosis Date   Arthritis    Cataract    Diabetes mellitus type 2 with complications ([place])    diagnosed probably 2012 per patient   Diabetic retinopathy ([place])    Hypertension    Current Outpatient Medications on File Prior to Visit  Medication Sig Dispense Refill   ACCU-CHEK GUIDE test strip See admin  instructions.     Accu-Chek Softclix Lancets lancets See admin instructions.     aspirin EC 81 MG tablet Take 81 mg by mouth every Monday, Wednesday, and Friday.     atorvastatin (LIPITOR) 40 MG tablet Take 1 tablet (40 mg total) by mouth daily. 90 tablet 3   Blood Glucose Monitoring Suppl (ACCU-CHEK GUIDE ME) w/Device KIT See admin instructions.     Continuous Blood Gluc Sensor (FREESTYLE LIBRE 14 DAY SENSOR) MISC 1 each by Does not apply route every 14 (fourteen) days. 2 each 11   dapagliflozin propanediol (FARXIGA) 10 MG TABS tablet Take 1 tablet (10 mg total) by mouth daily before breakfast. 30 tablet 6   ferrous gluconate (FERGON) 324 MG tablet Take 1 tablet (324 mg total) by mouth 2 (two) times daily with a meal. 60 tablet 2   furosemide (LASIX) 40 MG tablet Take 0.5 tablets (20 mg total) by mouth daily. 15 tablet 11   hydrALAZINE (APRESOLINE) 50 MG tablet Take 1 tablet (50 mg total) by mouth every 8 (eight) hours. 90 tablet 2   isosorbide mononitrate (IMDUR) 60 MG 24 hr tablet Take 1 tablet (60 mg total) by mouth daily. 30 tablet 11   linagliptin (TRADJENTA) 5 MG TABS tablet Take 1 tablet (5 mg total) by mouth daily. 30 tablet 1   metoprolol succinate (TOPROL-XL) 50 MG 24 hr tablet Take 1 tablet (50 mg total) by mouth daily. Take with or immediately following a meal. 30 tablet 2   potassium chloride SA (KLOR-CON)  20 MEQ tablet Take 2 tablets (40 mEq total) by mouth 2 (two) times daily. 120 tablet 3   sacubitril-valsartan (ENTRESTO) 49-51 MG Take 1 tablet by mouth 2 (two) times daily. 60 tablet 11   spironolactone (ALDACTONE) 25 MG tablet Take 1 tablet (25 mg total) by mouth daily. 15 tablet 11   Vitamin D, Ergocalciferol, (DRISDOL) 1.25 MG (50000 UNIT) CAPS capsule Take 50,000 Units by mouth once a week.     docusate sodium (COLACE) 100 MG capsule Take 100 mg by mouth daily as needed for mild constipation. (Patient not taking: Reported on 05/04/2021)     No current facility-administered  medications on file prior to visit.    The following portions of the patient's history were reviewed and updated as appropriate: allergies, current medications, past family history, past medical history, past social history, past surgical history and problem list.  ROS Otherwise as in subjective above  Objective: BP (!) 176/80   Pulse 67   Ht $R'5\' 3"'Vq$  (1.6 m)   Wt 134 lb 6.4 oz (61 kg)   SpO2 98%   BMI 23.81 kg/m   BP Readings from Last 3 Encounters:  05/04/21 (!) 176/80  03/29/21 (!) 166/70  03/21/21 (!) 172/80   Wt Readings from Last 3 Encounters:  05/04/21 134 lb 6.4 oz (61 kg)  03/29/21 141 lb (64 kg)  03/21/21 138 lb 12.8 oz (63 kg)    General appearance: alert, no distress, well developed, well nourished neck: supple, no lymphadenopathy, no thyromegaly, no masses Heart: RRR, normal S1, S2, no murmurs Lungs: CTA bilaterally, no wheezes, rhonchi, or rales Abdomen: +bs, soft, non tender, non distended, no masses, no hepatomegaly, no splenomegaly Pulses: 2+ radial pulses, 2+ pedal pulses, normal cap refill Ext: no edema  Diabetic Foot Exam - Simple   Simple Foot Form Diabetic Foot exam was performed with the following findings: Yes 05/04/2021 12:12 PM  Visual Inspection See comments: Yes Sensation Testing See comments: Yes Pulse Check See comments: Yes Comments 1+ pedal pulses, several thickened hypertrophic nails on both feet, monofilament sensation normal right foot but seems decreased somewhat on several spots of the left foot       Assessment: Encounter Diagnoses  Name Primary?   Type 2 diabetes mellitus with retinopathy of both eyes, without long-term current use of insulin, macular edema presence unspecified, unspecified retinopathy severity (HCC) Yes   Essential hypertension, benign    High risk medication use    Iron deficiency anemia, unspecified iron deficiency anemia type    Rheumatoid arthritis involving both hands, unspecified whether rheumatoid  factor present ([place])    Hyperlipidemia associated with type 2 diabetes mellitus (HCC)    Combined systolic and diastolic congestive heart failure, unspecified HF chronicity (HCC)    Stable angina ([place])    Screen for colon cancer    Need for pneumococcal vaccination    Vaginal itching    Encounter for screening mammogram for malignant neoplasm of breast    Enlarged and hypertrophic nails    Vaccine counseling    Weight loss    Tobacco use      Plan: Diabetes Advise yearly eye doctor follow-up Advised daily foot checks Advised glucose monitoring Updated labs today and continue medication compliance  Dyslipidemia Continue statin and aspirin daily  Hypertension and history of heart failure -continue routine follow-up with cardiology who manages those medications  Vitamin D deficiency Continue supplement  Rheumatoid arthritis I reviewed the recent rheumatology notes from 03/2021.  She has follow-up soon  to discuss medications.  Not currently on medication for rheumatoid  Vaginal itching-self-report shows no obvious yeast or abnormality and UA shows glucose but no infection.   She does have risk of yeast vaginitis.  Hydrate well.   I sent script for Diflucan in the event of discharge or worse itching in the meantime.   Screening for breast cancer Advise she go ahead and schedule mammogram.  Order placed  She has concerns of weight loss-I suspect this is partly due to SGLT2 medication along with overall changes in health and medications over the last year.  No recent significant weight loss.  Continue to monitor.  We discussed other potential causes.  She is in the process of getting colonoscopy consult and we will go ahead and get the mammogram out of the way  Tobacco use - advise she quit smoking completely  Hypertrophic nails-we can consider podiatry going forward for diabetic foot care.  Vaccines Immunization History  Administered Date(s) Administered   PFIZER(Purple  Top)SARS-COV-2 Vaccination 02/09/2020, 03/01/2020, 11/05/2020   I recommend a yearly flu shot, pneumococcal 23 vaccine, Shingrix vaccine, tetanus every 10 years  Counseled on the pneumococcal vaccine.  Vaccine information sheet given.  Pneumococcal vaccine PPSV23 given after consent obtained.    Keeshia was seen today for follow-up.  Diagnoses and all orders for this visit:  Type 2 diabetes mellitus with retinopathy of both eyes, without long-term current use of insulin, macular edema presence unspecified, unspecified retinopathy severity (HCC) -     Hemoglobin A1c -     Microalbumin/Creatinine Ratio, Urine  Essential hypertension, benign  High risk medication use -     POCT Urinalysis DIP (Proadvantage Device)  Iron deficiency anemia, unspecified iron deficiency anemia type -     Iron, TIBC and Ferritin Panel -     CBC  Rheumatoid arthritis involving both hands, unspecified whether rheumatoid factor present ([place])  Hyperlipidemia associated with type 2 diabetes mellitus (HCC)  Combined systolic and diastolic congestive heart failure, unspecified HF chronicity (HCC)  Stable angina ([place])  Screen for colon cancer  Need for pneumococcal vaccination  Vaginal itching -     POCT Urinalysis DIP (Proadvantage Device)  Encounter for screening mammogram for malignant neoplasm of breast -     MM Digital Screening; Future  Enlarged and hypertrophic nails  Vaccine counseling  Weight loss  Tobacco use  Other orders -     Pneumococcal polysaccharide vaccine 23-valent greater than or equal to 2yo subcutaneous/IM  Spent > 45 minutes face to face with patient in discussion of symptoms, evaluation, plan and recommendations.     Follow up: pending labs

## 2021-05-05 LAB — HEMOGLOBIN A1C
Est. average glucose Bld gHb Est-mCnc: 154 mg/dL
Hgb A1c MFr Bld: 7 % — ABNORMAL HIGH (ref 4.8–5.6)

## 2021-05-05 LAB — IRON,TIBC AND FERRITIN PANEL
Ferritin: 71 ng/mL (ref 15–150)
Iron Saturation: 9 % — CL (ref 15–55)
Iron: 27 ug/dL (ref 27–139)
Total Iron Binding Capacity: 297 ug/dL (ref 250–450)
UIBC: 270 ug/dL (ref 118–369)

## 2021-05-05 LAB — CBC
Hematocrit: 41.5 % (ref 34.0–46.6)
Hemoglobin: 12.8 g/dL (ref 11.1–15.9)
MCH: 25.9 pg — ABNORMAL LOW (ref 26.6–33.0)
MCHC: 30.8 g/dL — ABNORMAL LOW (ref 31.5–35.7)
MCV: 84 fL (ref 79–97)
Platelets: 251 10*3/uL (ref 150–450)
RBC: 4.94 x10E6/uL (ref 3.77–5.28)
RDW: 18 % — ABNORMAL HIGH (ref 11.7–15.4)
WBC: 7.4 10*3/uL (ref 3.4–10.8)

## 2021-05-05 LAB — MICROALBUMIN / CREATININE URINE RATIO
Creatinine, Urine: 42.2 mg/dL
Microalb/Creat Ratio: 38 mg/g creat — ABNORMAL HIGH (ref 0–29)
Microalbumin, Urine: 15.9 ug/mL

## 2021-05-07 NOTE — Progress Notes (Signed)
Office Visit Note  Patient: Maria Foster             Date of Birth: 07-04-1956           MRN: [id number]             PCP: Jac Canavan, PA-C Referring: Jac Canavan, PA-C Visit Date: 05/08/2021   Subjective:  Follow-up (Patient denies changes in symptoms since last visit. )   History of Present Illness: Maria Foster is a 65 y.o. female here for follow up for seropositive rheumatoid arthritis and chronic gout.  At her last visit x-rays of hands and feet were obtained that demonstrated significant erosive damage that appears most consistent with chronic tophaceous gout.  However her most recent labs demonstrate a uric acid less than 6 not on urate lowering pharmacotherapy.  She continues to feel reasonably well without much joint pain despite the extensive chronic changes.  Previous HPI: Maria Foster is a 65 y.o. female referred here for evaluation of rheumatoid arthritis.  She has a longstanding history of bilateral hand and knee pain that has been going on for years with some progressive worsening and deformity over time.  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  She also has chronic knee pain and stiffness bilaterally without any large nodular changes.  She does not have much joint pain most days however experiences some episodically and also feels that her hands and knees hurt worse with cold and rainy weather.  She describes a history of rheumatoid arthritis but has never been on treatment plans for this.  She states she has never had any episode of gout involving the toes or feet but has been told by her cardiologist this is a problem that she has.   She does not recall ever undergoing bone density screening for osteoporosis. She has a long smoking history but in recent months is  either not smoking or very reduced usage most days.  Review of Systems  Constitutional:  Negative for fatigue.  HENT:  Negative for mouth sores, mouth dryness and nose dryness.   Eyes:  Negative for pain, itching, visual disturbance and dryness.  Respiratory:  Negative for cough, hemoptysis, shortness of breath and difficulty breathing.   Cardiovascular:  Negative for chest pain, palpitations and swelling in legs/feet.  Gastrointestinal:  Negative for abdominal pain, blood in stool, constipation and diarrhea.  Endocrine: Negative for increased urination.  Genitourinary:  Negative for painful urination.  Musculoskeletal:  Positive for joint pain, joint pain, joint swelling and morning stiffness. Negative for myalgias, muscle weakness, muscle tenderness and myalgias.  Skin:  Negative for color change, rash and redness.  Allergic/Immunologic: Negative for susceptible to infections.  Neurological:  Negative for dizziness, numbness, headaches, memory loss and weakness.  Hematological:  Negative for swollen glands.  Psychiatric/Behavioral:  Negative for confusion and sleep disturbance.    PMFS History:  Patient Active Problem List   Diagnosis Date Noted   Essential hypertension, benign 05/04/2021   Hyperlipidemia associated with type 2 diabetes mellitus (HCC) 05/04/2021   Enlarged and hypertrophic nails 05/04/2021   Encounter for screening mammogram for malignant neoplasm of breast 05/04/2021   Need for pneumococcal vaccination 05/04/2021   Vaginal itching 05/04/2021   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HCC) 05/04/2021  Stable angina (HCC) 05/04/2021   Vaccine counseling 05/04/2021   Tobacco use 05/04/2021   Weight loss 05/04/2021   High risk medication use 03/29/2021   Iron deficiency anemia 01/27/2021   Screen for colon cancer 01/27/2021   Yeast vaginitis 01/11/2021   Diarrhea 01/11/2021   Former smoker 01/11/2021   Rhinorrhea 01/11/2021   Tophi 01/11/2021    Rheumatoid arthritis involving both hands (HCC) 01/11/2021   Elevated troponin    Acute combined systolic and diastolic heart failure (HCC)    Diabetes (HCC) 12/19/2020   Constipation 12/19/2020   AKI (acute kidney injury) (HCC) 12/19/2020    Past Medical History:  Diagnosis Date   Arthritis    Cataract    Diabetes mellitus type 2 with complications (HCC)    diagnosed probably 2012 per patient   Diabetic retinopathy (HCC)    Hypertension     Family History  Problem Relation Age of Onset   Diabetes Mother    Hypertension Mother    Heart Problems Sister    Amblyopia Neg Hx    Blindness Neg Hx    Cataracts Neg Hx    Glaucoma Neg Hx    Macular degeneration Neg Hx    Retinal detachment Neg Hx    Strabismus Neg Hx    Retinitis pigmentosa Neg Hx    Past Surgical History:  Procedure Laterality Date   PRP Bilateral OD - 07/23/2017 OS - 08/06/2017   RIGHT/LEFT HEART CATH AND CORONARY ANGIOGRAPHY N/A 01/05/2021   Procedure: RIGHT/LEFT HEART CATH AND CORONARY ANGIOGRAPHY;  Surgeon: Dolores Patty, MD;  Location: [facility];  Service: Cardiovascular;  Laterality: N/A;   Social History   Social History Narrative   Not on file   Immunization History  Administered Date(s) Administered   PFIZER(Purple Top)SARS-COV-2 Vaccination 02/09/2020, 03/01/2020, 11/05/2020   Pneumococcal Polysaccharide-23 05/04/2021     Objective: Vital Signs: BP (!) 177/75 (BP Location: Left Arm, Patient Position: Sitting, Cuff Size: Normal)   Pulse 76   Ht 5\' 3"  (1.6 m)   Wt 132 lb 9.6 oz (60.1 kg)   BMI 23.49 kg/m    Physical Exam Skin:    General: Skin is warm and dry.     Findings: No rash.  Neurological:     Mental Status: She is alert.     Musculoskeletal Exam:  Large painless firm nodules in forearm extensor surfaces, dorsum of wrists, over MCP, PIP, and DIP joints of both hands Knees full ROM no tenderness or swelling, bilateral patellofemoral  crepitus   Investigation: No additional findings.  Imaging: No results found.  Recent Labs: Lab Results  Component Value Date   WBC 7.4 05/04/2021   HGB 12.8 05/04/2021   PLT 251 05/04/2021   NA 137 03/28/2021   K 3.7 03/28/2021   CL 106 03/28/2021   CO2 25 03/28/2021   GLUCOSE 136 (H) 03/28/2021   BUN 17 03/28/2021   CREATININE 1.07 (H) 03/28/2021   BILITOT 1.8 (H) 12/21/2020   ALKPHOS 119 12/21/2020   AST 30 12/21/2020   ALT 31 12/21/2020   PROT 7.5 12/21/2020   ALBUMIN 3.1 (L) 12/21/2020   CALCIUM 8.8 (L) 03/28/2021   GFRAA >60 11/29/2016    Speciality Comments: No specialty comments available.  Procedures:  No procedures performed Allergies: Metformin and related   Assessment / Plan:     Visit Diagnoses: Rheumatoid arthritis involving both hands, unspecified whether rheumatoid factor present (HCC) - Plan: hydroxychloroquine (PLAQUENIL) 200 MG tablet  I discussed presence  of seropositive RA unclear whether this is disease activity or changes are more residual from tophaceous nodules and erosions.  Due to her multiple medical comorbidities would not be a good candidate for aggressive DMARD treatment especially considering her minimal daily symptoms.  Discussed starting daily oral hydroxychloroquine for joint inflammation.  Discussed risk and benefits that she would need annual ophthalmology screening for drug maintenance.  Tophi  Imaging looks consistent with extensive chronic tophaceous gout.  With normal recent uric acid not recommending new urate lowering therapy today.  We will be following up if this shows an elevation would have a low threshold to recommend treatment.  Orders: No orders of the defined types were placed in this encounter.  Meds ordered this encounter  Medications   hydroxychloroquine (PLAQUENIL) 200 MG tablet    Sig: Take 1 tablet (200 mg total) by mouth daily.    Dispense:  30 tablet    Refill:  2     Follow-Up Instructions: Return  in about 2 months (around 07/08/2021) for Seropositive RA new HCQ start, nodular, also tophaceous and erosions.   Fuller Plan, MD  Note - This record has been created using [employer].  Chart creation errors have been sought, but may not always  have been located. Such creation errors do not reflect on  the standard of medical care.

## 2021-05-08 ENCOUNTER — Other Ambulatory Visit: Payer: Self-pay | Admitting: Medical

## 2021-05-08 ENCOUNTER — Other Ambulatory Visit: Payer: Self-pay

## 2021-05-08 ENCOUNTER — Ambulatory Visit: Payer: Medicare Other | Admitting: Internal Medicine

## 2021-05-08 ENCOUNTER — Encounter: Payer: Self-pay | Admitting: Internal Medicine

## 2021-05-08 VITALS — BP 177/75 | HR 76 | Ht 63.0 in | Wt 132.6 lb

## 2021-05-08 DIAGNOSIS — M069 Rheumatoid arthritis, unspecified: Secondary | ICD-10-CM | POA: Diagnosis not present

## 2021-05-08 DIAGNOSIS — M1A9XX1 Chronic gout, unspecified, with tophus (tophi): Secondary | ICD-10-CM | POA: Diagnosis not present

## 2021-05-08 MED ORDER — HYDROXYCHLOROQUINE SULFATE 200 MG PO TABS
200.0000 mg | ORAL_TABLET | Freq: Every day | ORAL | 2 refills | Status: DC
Start: 2021-05-08 — End: 2021-12-12

## 2021-05-08 MED ORDER — FERROUS GLUCONATE 324 (38 FE) MG PO TABS: 324.0000 mg | ORAL_TABLET | Freq: Two times a day (BID) | ORAL | 2 refills | Status: DC

## 2021-05-08 MED ORDER — DAPAGLIFLOZIN PROPANEDIOL 10 MG PO TABS: 10.0000 mg | ORAL_TABLET | Freq: Every day | ORAL | 3 refills | Status: DC

## 2021-05-08 MED ORDER — LINAGLIPTIN 5 MG PO TABS: 5.0000 mg | ORAL_TABLET | Freq: Every day | ORAL | 1 refills | Status: DC

## 2021-05-09 ENCOUNTER — Other Ambulatory Visit: Payer: Self-pay | Admitting: Medical

## 2021-05-11 ENCOUNTER — Ambulatory Visit
Admission: RE | Admit: 2021-05-11 | Discharge: 2021-05-11 | Disposition: A | Discharge status: Home / Self Care | Payer: Medicare Other | Source: Ambulatory Visit | Attending: Medical | Admitting: Medical

## 2021-05-11 ENCOUNTER — Other Ambulatory Visit: Payer: Self-pay

## 2021-05-11 DIAGNOSIS — Z1231 Encounter for screening mammogram for malignant neoplasm of breast: Secondary | ICD-10-CM

## 2021-05-17 ENCOUNTER — Other Ambulatory Visit: Payer: Self-pay

## 2021-05-17 ENCOUNTER — Ambulatory Visit (HOSPITAL_COMMUNITY)
Admission: RE | Admit: 2021-05-17 | Discharge: 2021-05-17 | Disposition: A | Discharge status: Home / Self Care | Payer: Medicare Other | Source: Ambulatory Visit | Attending: Medical | Admitting: Medical

## 2021-05-17 ENCOUNTER — Ambulatory Visit (HOSPITAL_BASED_OUTPATIENT_CLINIC_OR_DEPARTMENT_OTHER)
Admission: RE | Admit: 2021-05-17 | Discharge: 2021-05-17 | Disposition: A | Discharge status: Home / Self Care | Payer: Medicare Other | Source: Ambulatory Visit | Attending: Internal Medicine | Admitting: Internal Medicine

## 2021-05-17 ENCOUNTER — Encounter (HOSPITAL_COMMUNITY): Payer: Self-pay | Admitting: Internal Medicine

## 2021-05-17 VITALS — BP 190/80 | HR 55 | Wt 135.4 lb

## 2021-05-17 DIAGNOSIS — I130 Hypertensive heart and chronic kidney disease with heart failure and stage 1 through stage 4 chronic kidney disease, or unspecified chronic kidney disease: Secondary | ICD-10-CM | POA: Diagnosis present

## 2021-05-17 DIAGNOSIS — I083 Combined rheumatic disorders of mitral, aortic and tricuspid valves: Secondary | ICD-10-CM | POA: Insufficient documentation

## 2021-05-17 DIAGNOSIS — N1831 Chronic kidney disease, stage 3a: Secondary | ICD-10-CM | POA: Diagnosis not present

## 2021-05-17 DIAGNOSIS — N189 Chronic kidney disease, unspecified: Secondary | ICD-10-CM | POA: Diagnosis not present

## 2021-05-17 DIAGNOSIS — E1122 Type 2 diabetes mellitus with diabetic chronic kidney disease: Secondary | ICD-10-CM | POA: Diagnosis not present

## 2021-05-17 DIAGNOSIS — I5022 Chronic systolic (congestive) heart failure: Secondary | ICD-10-CM | POA: Diagnosis not present

## 2021-05-17 DIAGNOSIS — G4733 Obstructive sleep apnea (adult) (pediatric): Secondary | ICD-10-CM

## 2021-05-17 DIAGNOSIS — I5042 Chronic combined systolic (congestive) and diastolic (congestive) heart failure: Secondary | ICD-10-CM | POA: Diagnosis not present

## 2021-05-17 DIAGNOSIS — I340 Nonrheumatic mitral (valve) insufficiency: Secondary | ICD-10-CM

## 2021-05-17 DIAGNOSIS — I2510 Atherosclerotic heart disease of native coronary artery without angina pectoris: Secondary | ICD-10-CM | POA: Diagnosis not present

## 2021-05-17 DIAGNOSIS — I10 Essential (primary) hypertension: Secondary | ICD-10-CM

## 2021-05-17 LAB — ECHOCARDIOGRAM COMPLETE
Area-P 1/2: 3.45 cm2
MV M vel: 5.58 m/s
MV Peak grad: 124.5 mmHg
P 1/2 time: 384 msec
Radius: 0.7 cm
S' Lateral: 3.65 cm

## 2021-05-17 MED ORDER — HYDRALAZINE HCL 50 MG PO TABS: 50.0000 mg | ORAL_TABLET | Freq: Three times a day (TID) | ORAL | 6 refills | Status: DC

## 2021-05-17 MED ORDER — SPIRONOLACTONE 25 MG PO TABS: 25.0000 mg | ORAL_TABLET | Freq: Every day | ORAL | 11 refills | Status: DC

## 2021-05-17 MED ORDER — FUROSEMIDE 40 MG PO TABS: 20.0000 mg | ORAL_TABLET | Freq: Every day | ORAL | 11 refills | Status: DC

## 2021-05-17 MED ORDER — ENTRESTO 97-103 MG PO TABS: 1.0000 | ORAL_TABLET | Freq: Two times a day (BID) | ORAL | 6 refills | Status: DC

## 2021-05-17 NOTE — Patient Instructions (Signed)
Increase Entresto to 97/103 mg Twice daily   You have been referred to [facility] office for their hypertension clinic  Your physician has requested that you have a cardiac MRI. Cardiac MRI uses a computer to create images of your heart as its beating, producing both still and moving pictures of your heart and major blood vessels. For further information please visit [email or web address]. Please follow the instruction sheet given to you today for more information. ONCE THIS IS APPROVED BY YOUR INSURANCE COMPANY YOU WILL BE CALLED TO SCHEDULE THIS  Your physician recommends that you schedule a follow-up appointment in: 3-4 months  If you have any questions or concerns before your next appointment please send [place] a message through [place] or call our office at [phone].    TO LEAVE A MESSAGE FOR THE NURSE SELECT OPTION 2, PLEASE LEAVE A MESSAGE INCLUDING: YOUR NAME DATE OF BIRTH CALL BACK NUMBER REASON FOR CALL**this is important as we prioritize the call backs  YOU WILL RECEIVE A CALL BACK THE SAME DAY AS LONG AS YOU CALL BEFORE 4:00 PM  At the [facility], you and your health needs are our priority. As part of our continuing mission to provide you with exceptional heart care, we have created designated Provider Care Teams. These Care Teams include your primary Cardiologist (physician) and Advanced Practice Providers (APPs- Physician Assistants and Nurse Practitioners) who all work together to provide you with the care you need, when you need it.   You may see any of the following providers on your designated Care Team at your next follow up: Dr Arvilla Meres Dr Marca Ancona Dr Brandon Melnick, NP Robbie Lis, [place] Mikki Santee [name], PharmD   Please be sure to bring in all your medications bottles to every appointment.

## 2021-05-17 NOTE — Progress Notes (Signed)
  Echocardiogram 2D Echocardiogram with 3D and strain has been performed.  Leta Jungling M 05/17/2021, 10:58 AM

## 2021-05-17 NOTE — Progress Notes (Signed)
[facility]  PCP: Dr Elonda Husky  Cardiologist: Dr. Oval Linsey HF Cardiologist: Dr. Haroldine Laws  HPI: Maria Foster is a 65 y.o.  female  with DM2, HTN, obesity, CKD 3a and systolic HF        Admitted 12/19/20 with new onset HF. On admit also hypertensive with AKI. 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 She was diuresed with IV lasix and cardiology was consulted. Discharge weight was 144.5 lbs. Underwent cath 2/22 with 3v CAD with totally occluded RCA with L -> R collaterals, 80% lesion in mLCX and non-obstructive CAD in LAD .EF 40% with elevated filling pressures and normal CO. Given lack of angina. Treated medically. 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    Here for FU with ECHO.  Has run out of hydralazine and spironolactone since last week.  Takes $Remo'20mg'sCetn$  lasix daily. Says  bp usually around 170/80.  Did not take all the medications she does have this morning. Feeling well. Denies dyspnea, chest pain, orthopnea.  Trying to get a part time job so she can get out of the house more.  She goes to the mall, grocery store doesn't get dyspneic or tired.    ECHO today 05/17/21 EF 55%, Rheumatic MV Severe MR (in setting of SBP 190), Mod TR, Mild AI. 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 Mid Cx lesion is 80% stenosed. 1st Mrg lesion is 100% stenosed. Ramus lesion is 30% stenosed. Mid LM to Dist LM lesion is 20% stenosed. Prox LAD lesion is 30% stenosed. Dist RCA lesion is 100% stenosed. Findings: Ao = 154.73 (104) LV = 143/20 RA =  13 RV = 65/15 PA = 60/24 (37)  PCW = 27  (v = 40) Fick cardiac output/index = 4.7/2.7 PVR = 2.1 WU SVR = 1552 Ao sat = 97% PA sat = 64%, 64% Assessment: 1. 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 80% lesion in mLCX 3. Mild non-obstructive CAD in LAD 4. 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 Treat medically for now. If develops anginal symptoms can consider PCI mLCX. Needs diuresis first.   ROS: All systems negative except as listed in HPI, PMH and Problem List.  SH:  Social History   Socioeconomic History   Marital status: Married    Spouse name: Not on file   Number of children: Not on file   Years of education: Not on file   Highest education level: Not on file  Occupational History   Not on file  Tobacco Use   Smoking status: Former    Packs/day: 0.50    Pack years: 0.00    Types: Cigarettes    Quit date: 11/19/2020    Years since quitting: 0.4   Smokeless tobacco: Never  Vaping Use   Vaping Use: Never used  Substance and Sexual Activity   Alcohol use: Not Currently   Drug use: No   Sexual activity: Not on file  Other Topics Concern   Not on file  Social History Narrative   Not  on file   Social Determinants of Health   Financial Resource Strain: Medium Risk   Difficulty of Paying Living Expenses: Somewhat hard  Food Insecurity: No Food Insecurity   Worried About Charity fundraiser in the Last Year: Never true   Ran Out of Food in the Last Year: Never true  Transportation Needs: No Transportation Needs   Lack of Transportation (Medical): No   Lack of Transportation (Non-Medical): No  Physical Activity: Not on file  Stress: Not on file  Social Connections: Not on file  Intimate Partner Violence: Not on file   FH:  Family History  Problem Relation Age of Onset   Diabetes Mother    Hypertension Mother    Heart Problems Sister    Amblyopia Neg Hx    Blindness Neg Hx    Cataracts Neg Hx    Glaucoma Neg Hx     Macular degeneration Neg Hx    Retinal detachment Neg Hx    Strabismus Neg Hx    Retinitis pigmentosa Neg Hx    Past Medical History:  Diagnosis Date   Arthritis    Cataract    Diabetes mellitus type 2 with complications ([place])    diagnosed probably 2012 per patient   Diabetic retinopathy ([place])    Hypertension    Current Outpatient Medications  Medication Sig Dispense Refill   ACCU-CHEK GUIDE test strip See admin instructions.     Accu-Chek Softclix Lancets lancets See admin instructions.     aspirin EC 81 MG tablet Take 81 mg by mouth every Monday, Wednesday, and Friday.     atorvastatin (LIPITOR) 40 MG tablet Take 1 tablet (40 mg total) by mouth daily. 90 tablet 3   Blood Glucose Monitoring Suppl (ACCU-CHEK GUIDE ME) w/Device KIT See admin instructions.     Continuous Blood Gluc Sensor (FREESTYLE LIBRE 14 DAY SENSOR) MISC 1 each by Does not apply route every 14 (fourteen) days. 2 each 11   dapagliflozin propanediol (FARXIGA) 10 MG TABS tablet Take 1 tablet (10 mg total) by mouth daily before breakfast. 90 tablet 3   docusate sodium (COLACE) 100 MG capsule Take 100 mg by mouth daily as needed for mild constipation.     ferrous gluconate (FERGON) 324 MG tablet Take 1 tablet (324 mg total) by mouth 2 (two) times daily with a meal. 60 tablet 2   furosemide (LASIX) 40 MG tablet Take 0.5 tablets (20 mg total) by mouth daily. 15 tablet 11   furosemide (LASIX) 40 MG tablet Take 40 mg by mouth daily.     hydrALAZINE (APRESOLINE) 50 MG tablet Take 1 tablet (50 mg total) by mouth every 8 (eight) hours. 90 tablet 2   hydroxychloroquine (PLAQUENIL) 200 MG tablet Take 1 tablet (200 mg total) by mouth daily. 30 tablet 2   isosorbide mononitrate (IMDUR) 60 MG 24 hr tablet Take 1 tablet (60 mg total) by mouth daily. 30 tablet 11   linagliptin (TRADJENTA) 5 MG TABS tablet Take 1 tablet (5 mg total) by mouth daily. 90 tablet 1   metoprolol succinate (TOPROL-XL) 50 MG 24 hr tablet Take 1 tablet (50 mg  total) by mouth daily. Take with or immediately following a meal. 30 tablet 2   potassium chloride SA (KLOR-CON) 20 MEQ tablet Take 2 tablets (40 mEq total) by mouth 2 (two) times daily. 120 tablet 3   sacubitril-valsartan (ENTRESTO) 49-51 MG Take 1 tablet by mouth 2 (two) times daily. 60 tablet 11   spironolactone (ALDACTONE) 25 MG  tablet Take 1 tablet (25 mg total) by mouth daily. 15 tablet 11   Vitamin D, Ergocalciferol, (DRISDOL) 1.25 MG (50000 UNIT) CAPS capsule Take 50,000 Units by mouth once a week.     No current facility-administered medications for this encounter.   Vitals:   05/17/21 1017  BP: (!) 190/80  Pulse: (!) 55  SpO2: 100%  Weight: 61.4 kg (135 lb 6.4 oz)   Wt Readings from Last 3 Encounters:  05/17/21 61.4 kg (135 lb 6.4 oz)  05/08/21 60.1 kg (132 lb 9.6 oz)  05/04/21 61 kg (134 lb 6.4 oz)    PHYSICAL EXAM: General:  Well appearing. No resp difficulty. Walked in the clinic.  HEENT: normal Neck: supple. no JVD. Carotids 2+ bilat; no bruits. No lymphadenopathy or thryomegaly appreciated. Cor: PMI nondisplaced. Regular rate & rhythm. No rubs, gallops or murmurs. Lungs: clear Abdomen: soft, nontender, nondistended. No hepatosplenomegaly. No bruits or masses. Good bowel sounds. Extremities: no cyanosis, clubbing, rash, edema. LUE fingers with Tophi Neuro: alert & orientedx3, cranial nerves grossly intact. moves all 4 extremities w/o difficulty. Affect pleasant  ASSESSMENT & PLAN:  1. Combined Systolic and Diastolic CHF, Biventricular CHF  - Long standing poorly controlled HTN - Concurrent Valvular disease moderate MR and mod-severe TR - Echo (2/22): EF 35-40% with global HK, LVH and moderate RV dysfunction - ECHO today 05/17/21 EF 55%, Rheumatic MV Severe MR (in setting of SBP 190), Mod TR, Mild AI. Personally reviewed - NYHA II. Volume status stable.  - Continue spiro to 25 mg daily.  - Continue Toprol XL 50 mg daily. - Continue hydralazine 50 mg tid + Imdur 60  mg daily. - Continue Farxiga 10 mg daily. - Continue lasix 40 mg daily. - Increase entresto to 97/$RemoveBefore'103mg'[id number]$  twice a day.  - will need to restart meds for today and discussed in detail the need for better adherence   2. CAD  - R/LHC (2/22) 3v CAD with totally occluded RCA with L -> R collaterals, 80% lesion in mLCX, mild non-obs in LAD - No chest pain.  - On ASA.  - Continue atorvastatin 40 mg daily.   3. 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 T2DM: -a1c 04/2021 7.0 -On tradjenta + Farxiga -recent yeast infection but resolved with treatment.  -Continue farxiga.   5. CKD3a: - Baseline Scr~1.2-1.5 - Cr 03/2021 1.07 and K 3.7   6. Severe Sleep Apnea . -AHI 60/hr -Says she is not comfortable with CPAP.  -Follows with Dr Radford Pax.   7. HTN -Elevated has not had hydral or spiro today and missed her other meds  8. Tobacco Use -has cut back significantly 1-2 cigs per day but not smoking every day  Katherine Roan   05/17/21  Patient seen and examined with the above-signed Advanced Practice Provider and/or Housestaff. I personally reviewed laboratory data, imaging studies and relevant notes. I independently examined the patient and formulated the important aspects of the plan. I have edited the note to reflect any of my changes or salient points. I have personally discussed the plan with the patient and/or family.  Symptomatically improved NYHA II- early III echo today with normalized EF but MV is rheumatic with severe MR in setting of uncontrolled BP.   General:  Well appearing. No resp difficulty HEENT: normal Neck: supple. no JVD. Carotids 2+ bilat; no bruits. No lymphadenopathy or thryomegaly appreciated. Cor: PMI nondisplaced. Regular rate & rhythm. 2/6 MR Lungs: clear Abdomen: soft,  nontender, nondistended. No hepatosplenomegaly. No bruits or masses. Good bowel sounds. Extremities: no cyanosis, clubbing, rash, edema Neuro: alert & orientedx3,  cranial nerves grossly intact. moves all 4 extremities w/o difficulty. Affect pleasant  EF has recovered but continues to struggle with severe HTN and difficulty with her medicines. Will refer to comprehensive HTN clinic to help. Once BP under control will need repeat echo. If MR still significant will need TEE with possible eye toward MVR/CABG.   Has very severe OSA contributing to HTN but unable to tolerate CPAP. Would she be candidate for Inspire device?   Total time spent 45 minutes. Over half that time spent discussing above.    Glori Bickers, MD  11:29 PM

## 2021-07-02 NOTE — Progress Notes (Signed)
Office Visit Note  Patient: Maria Foster             Date of Birth: 1956-07-08           MRN: [id number]             PCP: Jac Canavan, PA-C Referring: Jac Canavan, PA-C Visit Date: 07/03/2021   Subjective:  Follow-up (Patient is taking PLQ 200 mg daily and feels as if symptoms are well controlled. )   History of Present Illness: Maria Foster is a 65 y.o. female here for follow up for seropositive RA and chronic tophaceous gout. Recommended starting hydroxychloroquine treatment 200 mg PO daily for suspected RA as active disease and normal uric levels. She feels her symptoms are improved since starting this, particularly states pain with weather and temperature changes are decreased. She has right elbow pain more than elsewhere says this hurts mostly due to pressure on the site when lying in bed or certain seated positions.  Previous HPI: 05/08/21 Maria Foster is a 65 y.o. female here for follow up for seropositive rheumatoid arthritis and chronic gout.  At her last visit x-rays of hands and feet were obtained that demonstrated significant erosive damage that appears most consistent with chronic tophaceous gout.  However her most recent labs demonstrate a uric acid less than 6 not on urate lowering pharmacotherapy.  She continues to feel reasonably well without much joint pain despite the extensive chronic changes.   Previous HPI: Maria Foster is a 65 y.o. female referred here for evaluation of rheumatoid arthritis.  She has a longstanding history of bilateral hand and knee pain that has been going on for years with some progressive worsening and deformity over time.  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  She also has chronic knee pain and stiffness bilaterally without any large  nodular changes.  She does not have much joint pain most days however experiences some episodically and also feels that her hands and knees hurt worse with cold and rainy weather.  She describes a history of rheumatoid arthritis but has never been on treatment plans for this.  She states she has never had any episode of gout involving the toes or feet but has been told by her cardiologist this is a problem that she has.   She does not recall ever undergoing bone density screening for osteoporosis. She has a long smoking history but in recent months is either not smoking or very reduced usage most days.  Review of Systems  Constitutional:  Negative for fatigue.  HENT:  Negative for mouth sores, mouth dryness and nose dryness.   Eyes:  Negative for pain, itching, visual disturbance and dryness.  Respiratory:  Negative for cough, hemoptysis, shortness of breath and difficulty breathing.   Cardiovascular:  Negative for chest pain, palpitations and swelling in legs/feet.  Gastrointestinal:  Negative for abdominal pain, blood in stool, constipation and diarrhea.  Endocrine: Negative for increased urination.  Genitourinary:  Negative for painful urination.  Musculoskeletal:  Negative for joint pain, joint pain, joint swelling, myalgias, muscle weakness, morning stiffness, muscle tenderness and myalgias.  Skin:  Negative for color change, rash and redness.  Allergic/Immunologic: Negative for susceptible to infections.  Neurological:  Negative for dizziness, numbness, headaches, memory loss and weakness.  Hematological:  Negative for swollen glands.  Psychiatric/Behavioral:  Negative for confusion and sleep disturbance.    PMFS History:  Patient Active Problem List   Diagnosis Date Noted   Essential hypertension, benign 05/04/2021   Hyperlipidemia associated with type 2 diabetes mellitus (HCC) 05/04/2021   Enlarged and hypertrophic nails 05/04/2021   Encounter for screening mammogram for malignant  neoplasm of breast 05/04/2021   Need for pneumococcal vaccination 05/04/2021   Vaginal itching 05/04/2021   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HCC) 05/04/2021   Stable angina (HCC) 05/04/2021   Vaccine counseling 05/04/2021   Tobacco use 05/04/2021   Weight loss 05/04/2021   High risk medication use 03/29/2021   Iron deficiency anemia 01/27/2021   Screen for colon cancer 01/27/2021   Yeast vaginitis 01/11/2021   Diarrhea 01/11/2021   Former smoker 01/11/2021   Rhinorrhea 01/11/2021   Tophi 01/11/2021   Rheumatoid arthritis involving both hands (HCC) 01/11/2021   Elevated troponin    Acute combined systolic and diastolic heart failure (HCC)    Diabetes (HCC) 12/19/2020   Constipation 12/19/2020   AKI (acute kidney injury) (HCC) 12/19/2020    Past Medical History:  Diagnosis Date   Arthritis    Cataract    Diabetes mellitus type 2 with complications (HCC)    diagnosed probably 2012 per patient   Diabetic retinopathy (HCC)    Hypertension     Family History  Problem Relation Age of Onset   Diabetes Mother    Hypertension Mother    Heart Problems Sister    Amblyopia Neg Hx    Blindness Neg Hx    Cataracts Neg Hx    Glaucoma Neg Hx    Macular degeneration Neg Hx    Retinal detachment Neg Hx    Strabismus Neg Hx    Retinitis pigmentosa Neg Hx    Past Surgical History:  Procedure Laterality Date   PRP Bilateral OD - 07/23/2017 OS - 08/06/2017   RIGHT/LEFT HEART CATH AND CORONARY ANGIOGRAPHY N/A 01/05/2021   Procedure: RIGHT/LEFT HEART CATH AND CORONARY ANGIOGRAPHY;  Surgeon: Dolores Patty, MD;  Location: [facility];  Service: Cardiovascular;  Laterality: N/A;   Social History   Social History Narrative   Not on file   Immunization History  Administered Date(s) Administered   PFIZER(Purple Top)SARS-COV-2 Vaccination 02/09/2020, 03/01/2020, 11/05/2020   Pneumococcal Polysaccharide-23 05/04/2021     Objective: Vital Signs: BP  (!) 173/81 (BP Location: Left Arm, Patient Position: Sitting, Cuff Size: Normal)   Pulse 69   Ht 5\' 3"  (1.6 m)   Wt 129 lb 12.8 oz (58.9 kg)   BMI 22.99 kg/m    Physical Exam Eyes:     Conjunctiva/sclera: Conjunctivae normal.  Skin:    General: Skin is warm and dry.     Findings: No rash.  Neurological:     Mental Status: She is alert.  Psychiatric:        Mood and Affect: Mood normal.     Musculoskeletal Exam:  Shoulders full ROM no tenderness or swelling Elbows decreased extension ROM, large nodules present over elbows R>L also painless mobile nodules down the forearms extensor surfaces Extensive nodules over dorsal surfaces of joints in fingers MCPs, PIPs, and DIPs Knees full ROM no tenderness or swelling, patellofemoral crepitus present Ankles full ROM no tenderness or swelling   CDAI Exam: CDAI Score: 8  Patient Global: 20 mm; Provider Global: 50 mm Swollen: 0 ; Tender: 1  Joint Exam 07/03/2021  Right  Left  Elbow   Tender        Investigation: No additional findings.  Imaging: No results found.  Recent Labs: Lab Results  Component Value Date   WBC 7.4 05/04/2021   HGB 12.8 05/04/2021   PLT 251 05/04/2021   NA 137 03/28/2021   K 3.7 03/28/2021   CL 106 03/28/2021   CO2 25 03/28/2021   GLUCOSE 136 (H) 03/28/2021   BUN 17 03/28/2021   CREATININE 1.07 (H) 03/28/2021   BILITOT 1.8 (H) 12/21/2020   ALKPHOS 119 12/21/2020   AST 30 12/21/2020   ALT 31 12/21/2020   PROT 7.5 12/21/2020   ALBUMIN 3.1 (L) 12/21/2020   CALCIUM 8.8 (L) 03/28/2021   GFRAA >60 11/29/2016    Speciality Comments: No specialty comments available.  Procedures:  Medium Joint Inj: R olecranon bursa on 07/03/2021 2:41 PM Indications: pain and joint swelling Details: 25 G 1.5 in needle, dorsal approach Medications: 40 mg triamcinolone acetonide 40 MG/ML; 1 mL lidocaine 1 % Outcome: tolerated well, no immediate complications Procedure, treatment alternatives, risks and  benefits explained, specific risks discussed. Consent was given by the patient. Immediately prior to procedure a time out was called to verify the correct patient, procedure, equipment, support staff and site/side marked as required. Patient was prepped and draped in the usual sterile fashion.    Allergies: Metformin and related   Assessment / Plan:     Visit Diagnoses: Rheumatoid arthritis involving both hands, unspecified whether rheumatoid factor present (HCC) - Plan: Ambulatory referral to Ophthalmology, CBC with Differential/Platelet, BASIC METABOLIC PANEL WITH GFR, Sedimentation rate  Serologic findings currently more consistent with rheumatoid arthritis than the active gout despite extensive nodules and she reports improvement on the hydroxychloroquine.  Continue hydroxychloroquine 200 mg daily.  We will recheck CBC and CMP and sedimentation rate at baseline monitoring.  Referral to ophthalmology for retinal toxicity screening.  Discussed side effect risks of medications including QT prolongation and retinal toxicity with long-term use requiring monitoring.  Tophi - Plan: Uric acid  Tophaceous gout of multiple sites but uric acid was previously controlled off any particular urate lowering therapy.  Local steroid injection to the right elbow considering the limited inflammation elsewhere I wonder if pressure to the large chronic nodule is causing more symptoms versus the active inflammatory disease.  Recheck uric acid today.  Unusual distribution of nodules question possible overlap with a nodular RA.  Orders: Orders Placed This Encounter  Procedures   Medium Joint Inj   Uric acid   CBC with Differential/Platelet   BASIC METABOLIC PANEL WITH GFR   Sedimentation rate   Ambulatory referral to Ophthalmology   No orders of the defined types were placed in this encounter.    Follow-Up Instructions: Return in about 6 months (around 01/03/2022) for RA Gout on HCQ f/u 35mos.   Fuller Plan, MD  Note - This record has been created using [employer].  Chart creation errors have been sought, but may not always  have been located. Such creation errors do not reflect on  the standard of medical care.

## 2021-07-03 ENCOUNTER — Other Ambulatory Visit: Payer: Self-pay

## 2021-07-03 ENCOUNTER — Ambulatory Visit: Payer: Medicare Other | Admitting: Internal Medicine

## 2021-07-03 ENCOUNTER — Encounter: Payer: Self-pay | Admitting: Internal Medicine

## 2021-07-03 VITALS — BP 173/81 | HR 69 | Ht 63.0 in | Wt 129.8 lb

## 2021-07-03 DIAGNOSIS — M79641 Pain in right hand: Secondary | ICD-10-CM

## 2021-07-03 DIAGNOSIS — M069 Rheumatoid arthritis, unspecified: Secondary | ICD-10-CM | POA: Diagnosis not present

## 2021-07-03 DIAGNOSIS — M79642 Pain in left hand: Secondary | ICD-10-CM

## 2021-07-03 DIAGNOSIS — M1A9XX1 Chronic gout, unspecified, with tophus (tophi): Secondary | ICD-10-CM

## 2021-07-03 LAB — BASIC METABOLIC PANEL WITH GFR
BUN: 19 mg/dL (ref 7–25)
CO2: 26 mmol/L (ref 20–32)
Calcium: 9.5 mg/dL (ref 8.6–10.4)
Chloride: 104 mmol/L (ref 98–110)
Creat: 0.97 mg/dL (ref 0.50–1.05)
Glucose, Bld: 148 mg/dL — ABNORMAL HIGH (ref 65–99)
Potassium: 4 mmol/L (ref 3.5–5.3)
Sodium: 138 mmol/L (ref 135–146)
eGFR: 65 mL/min/{1.73_m2} (ref >=60)

## 2021-07-03 LAB — CBC WITH DIFFERENTIAL/PLATELET
Absolute Monocytes: 612 cells/uL (ref 200–950)
Basophils Absolute: 60 cells/uL (ref 0–200)
Basophils Relative: 0.7 %
Eosinophils Absolute: 128 cells/uL (ref 15–500)
Eosinophils Relative: 1.5 %
HCT: 41.7 % (ref 35.0–45.0)
Hemoglobin: 13.3 g/dL (ref 11.7–15.5)
Lymphs Abs: 927 cells/uL (ref 850–3900)
MCH: 27.7 pg (ref 27.0–33.0)
MCHC: 31.9 g/dL — ABNORMAL LOW (ref 32.0–36.0)
MCV: 86.7 fL (ref 80.0–100.0)
MPV: 10.7 fL (ref 7.5–12.5)
Monocytes Relative: 7.2 %
Neutro Abs: 6775 cells/uL (ref 1500–7800)
Neutrophils Relative %: 79.7 %
Platelets: 217 10*3/uL (ref 140–400)
RBC: 4.81 10*6/uL (ref 3.80–5.10)
RDW: 14.5 % (ref 11.0–15.0)
Total Lymphocyte: 10.9 %
WBC: 8.5 10*3/uL (ref 3.8–10.8)

## 2021-07-03 LAB — URIC ACID: Uric Acid, Serum: 4.9 mg/dL (ref 2.5–7.0)

## 2021-07-03 LAB — SEDIMENTATION RATE: Sed Rate: 58 mm/h — ABNORMAL HIGH (ref 0–30)

## 2021-07-04 NOTE — Progress Notes (Signed)
Blood test looks like the gout numbers are fine without medicine. Sedimentation rate is partway down with the hydroxychloroquine, she can continue the current 1 tablet daily for this.

## 2021-07-06 ENCOUNTER — Other Ambulatory Visit: Payer: Self-pay | Admitting: Medical

## 2021-07-09 MED ORDER — TRIAMCINOLONE ACETONIDE 40 MG/ML IJ SUSP
40.0000 mg | INTRAMUSCULAR | Status: AC | PRN
  Administered 2021-07-03: 40 mg via INTRAMUSCULAR

## 2021-07-09 MED ORDER — LIDOCAINE HCL 1 % IJ SOLN
1.0000 mL | INTRAMUSCULAR | Status: AC | PRN
  Administered 2021-07-03: 1 mL via INTRAMUSCULAR

## 2021-07-10 ENCOUNTER — Telehealth: Payer: Self-pay | Admitting: Radiology

## 2021-07-10 ENCOUNTER — Emergency Department (HOSPITAL_COMMUNITY): Payer: Medicare Other

## 2021-07-10 ENCOUNTER — Encounter (HOSPITAL_COMMUNITY): Payer: Self-pay

## 2021-07-10 ENCOUNTER — Inpatient Hospital Stay (HOSPITAL_COMMUNITY)
Admission: EM | Admit: 2021-07-10 | Discharge: 2021-07-15 | DRG: 857 | Disposition: A | Discharge status: Home Health | Payer: Medicare Other | Attending: Internal Medicine | Admitting: Internal Medicine

## 2021-07-10 ENCOUNTER — Other Ambulatory Visit: Payer: Self-pay

## 2021-07-10 DIAGNOSIS — I10 Essential (primary) hypertension: Secondary | ICD-10-CM | POA: Diagnosis present

## 2021-07-10 DIAGNOSIS — M71121 Other infective bursitis, right elbow: Secondary | ICD-10-CM

## 2021-07-10 DIAGNOSIS — Z7982 Long term (current) use of aspirin: Secondary | ICD-10-CM

## 2021-07-10 DIAGNOSIS — E785 Hyperlipidemia, unspecified: Secondary | ICD-10-CM | POA: Diagnosis present

## 2021-07-10 DIAGNOSIS — E872 Acidosis: Secondary | ICD-10-CM | POA: Diagnosis present

## 2021-07-10 DIAGNOSIS — I5042 Chronic combined systolic (congestive) and diastolic (congestive) heart failure: Secondary | ICD-10-CM | POA: Diagnosis present

## 2021-07-10 DIAGNOSIS — E1169 Type 2 diabetes mellitus with other specified complication: Secondary | ICD-10-CM | POA: Diagnosis present

## 2021-07-10 DIAGNOSIS — E119 Type 2 diabetes mellitus without complications: Secondary | ICD-10-CM

## 2021-07-10 DIAGNOSIS — T8029XA Infection following other infusion, transfusion and therapeutic injection, initial encounter: Secondary | ICD-10-CM | POA: Diagnosis not present

## 2021-07-10 DIAGNOSIS — Z23 Encounter for immunization: Secondary | ICD-10-CM

## 2021-07-10 DIAGNOSIS — Z7984 Long term (current) use of oral hypoglycemic drugs: Secondary | ICD-10-CM

## 2021-07-10 DIAGNOSIS — F17210 Nicotine dependence, cigarettes, uncomplicated: Secondary | ICD-10-CM | POA: Diagnosis present

## 2021-07-10 DIAGNOSIS — M06321 Rheumatoid nodule, right elbow: Secondary | ICD-10-CM | POA: Diagnosis present

## 2021-07-10 DIAGNOSIS — I130 Hypertensive heart and chronic kidney disease with heart failure and stage 1 through stage 4 chronic kidney disease, or unspecified chronic kidney disease: Secondary | ICD-10-CM | POA: Diagnosis present

## 2021-07-10 DIAGNOSIS — N1831 Chronic kidney disease, stage 3a: Secondary | ICD-10-CM | POA: Diagnosis present

## 2021-07-10 DIAGNOSIS — Z20822 Contact with and (suspected) exposure to covid-19: Secondary | ICD-10-CM | POA: Diagnosis present

## 2021-07-10 DIAGNOSIS — M009 Pyogenic arthritis, unspecified: Secondary | ICD-10-CM | POA: Diagnosis not present

## 2021-07-10 DIAGNOSIS — E1122 Type 2 diabetes mellitus with diabetic chronic kidney disease: Secondary | ICD-10-CM | POA: Diagnosis present

## 2021-07-10 DIAGNOSIS — Z833 Family history of diabetes mellitus: Secondary | ICD-10-CM

## 2021-07-10 DIAGNOSIS — Z79899 Other long term (current) drug therapy: Secondary | ICD-10-CM

## 2021-07-10 DIAGNOSIS — E860 Dehydration: Secondary | ICD-10-CM | POA: Diagnosis present

## 2021-07-10 DIAGNOSIS — D849 Immunodeficiency, unspecified: Secondary | ICD-10-CM | POA: Diagnosis present

## 2021-07-10 DIAGNOSIS — Z720 Tobacco use: Secondary | ICD-10-CM | POA: Diagnosis present

## 2021-07-10 DIAGNOSIS — Z8249 Family history of ischemic heart disease and other diseases of the circulatory system: Secondary | ICD-10-CM

## 2021-07-10 DIAGNOSIS — M059 Rheumatoid arthritis with rheumatoid factor, unspecified: Secondary | ICD-10-CM | POA: Diagnosis present

## 2021-07-10 DIAGNOSIS — Y848 Other medical procedures as the cause of abnormal reaction of the patient, or of later complication, without mention of misadventure at the time of the procedure: Secondary | ICD-10-CM | POA: Diagnosis present

## 2021-07-10 DIAGNOSIS — M069 Rheumatoid arthritis, unspecified: Secondary | ICD-10-CM | POA: Diagnosis present

## 2021-07-10 DIAGNOSIS — R9431 Abnormal electrocardiogram [ECG] [EKG]: Secondary | ICD-10-CM | POA: Diagnosis present

## 2021-07-10 DIAGNOSIS — E11319 Type 2 diabetes mellitus with unspecified diabetic retinopathy without macular edema: Secondary | ICD-10-CM | POA: Diagnosis present

## 2021-07-10 DIAGNOSIS — M7021 Olecranon bursitis, right elbow: Secondary | ICD-10-CM

## 2021-07-10 DIAGNOSIS — L0390 Cellulitis, unspecified: Secondary | ICD-10-CM

## 2021-07-10 HISTORY — DX: Rheumatoid arthritis, unspecified: M06.9

## 2021-07-10 NOTE — ED Provider Notes (Signed)
Emergency Medicine Provider Triage Evaluation Note  Maria Foster , a 65 y.o. female  was evaluated in triage.  Pt complains of elbow pain, swelling and drainage.  1 week ago started having some soreness and swelling from her elbow, saw her rheumatologist who injected some steroids into the elbow.  She reports a few days ago the swelling got worse and the elbow became more painful and then it busted open with a large amount of foul-smelling purulent drainage.  She reports she is continued to have drainage and pain primarily over the back of the elbow.  No fevers or chills.  Review of Systems  Positive: Elbow pain Negative: Fevers, chills  Physical Exam  BP (!) 190/94   Pulse 73   Temp 98.1 F (36.7 C) (Oral)   Resp 16   Ht 5\' 3"  (1.6 m)   Wt 59.9 kg   SpO2 99%   BMI 23.38 kg/m  Gen:   Awake, no distress   Resp:  Normal effort  MSK:   Moves extremities without difficult, right elbow with open wound with purulent drainage over the olecranon process suspicious for now draining olecranon bursitis Other:    Medical Decision Making  Medically screening exam initiated at 6:07 PM.  Appropriate orders placed.  was informed that the remainder of the evaluation will be completed by another provider, this initial triage assessment does not replace that evaluation, and the importance of remaining in the ED until their evaluation is complete.     [name], Dartha Lodge 07/10/21 1813    07/12/21, MD 07/12/21 [phone]

## 2021-07-10 NOTE — Telephone Encounter (Signed)
Spoke with patient, advised per Dr. Dimple Casey she should be evaluated in the emergency dept. Patient is in agreement.

## 2021-07-10 NOTE — ED Triage Notes (Signed)
Patient has rheumatoid arthritis and states she went to her rheumatologist a week ago and received a steroid shot in the right elbow. Patient states 2 days ago she had drainage from the right elbow with a foul odor.

## 2021-07-10 NOTE — Telephone Encounter (Signed)
Spoke with patient, she had an injection x 1 week ago- after the injection she had a bubble on her skin that has popped and is now oozing pus-like fluid. Patient is going to e-mail a picture.

## 2021-07-11 ENCOUNTER — Encounter (HOSPITAL_COMMUNITY)
Admission: EM | Disposition: A | Discharge status: Home Health | Payer: Self-pay | Source: Home / Self Care | Attending: Internal Medicine

## 2021-07-11 ENCOUNTER — Inpatient Hospital Stay (HOSPITAL_COMMUNITY): Payer: Medicare Other | Admitting: Certified Registered Nurse Anesthetist

## 2021-07-11 ENCOUNTER — Encounter (HOSPITAL_COMMUNITY): Payer: Self-pay | Admitting: Emergency Medicine

## 2021-07-11 DIAGNOSIS — Y848 Other medical procedures as the cause of abnormal reaction of the patient, or of later complication, without mention of misadventure at the time of the procedure: Secondary | ICD-10-CM | POA: Diagnosis present

## 2021-07-11 DIAGNOSIS — M009 Pyogenic arthritis, unspecified: Secondary | ICD-10-CM | POA: Diagnosis present

## 2021-07-11 DIAGNOSIS — N1831 Chronic kidney disease, stage 3a: Secondary | ICD-10-CM | POA: Diagnosis present

## 2021-07-11 DIAGNOSIS — Z79899 Other long term (current) drug therapy: Secondary | ICD-10-CM | POA: Diagnosis not present

## 2021-07-11 DIAGNOSIS — M05741 Rheumatoid arthritis with rheumatoid factor of right hand without organ or systems involvement: Secondary | ICD-10-CM | POA: Diagnosis not present

## 2021-07-11 DIAGNOSIS — E860 Dehydration: Secondary | ICD-10-CM | POA: Diagnosis present

## 2021-07-11 DIAGNOSIS — E1122 Type 2 diabetes mellitus with diabetic chronic kidney disease: Secondary | ICD-10-CM | POA: Diagnosis present

## 2021-07-11 DIAGNOSIS — M05742 Rheumatoid arthritis with rheumatoid factor of left hand without organ or systems involvement: Secondary | ICD-10-CM | POA: Diagnosis not present

## 2021-07-11 DIAGNOSIS — M059 Rheumatoid arthritis with rheumatoid factor, unspecified: Secondary | ICD-10-CM | POA: Diagnosis present

## 2021-07-11 DIAGNOSIS — I10 Essential (primary) hypertension: Secondary | ICD-10-CM | POA: Diagnosis not present

## 2021-07-11 DIAGNOSIS — M06321 Rheumatoid nodule, right elbow: Secondary | ICD-10-CM | POA: Diagnosis present

## 2021-07-11 DIAGNOSIS — R9431 Abnormal electrocardiogram [ECG] [EKG]: Secondary | ICD-10-CM

## 2021-07-11 DIAGNOSIS — T8029XA Infection following other infusion, transfusion and therapeutic injection, initial encounter: Secondary | ICD-10-CM | POA: Diagnosis present

## 2021-07-11 DIAGNOSIS — E785 Hyperlipidemia, unspecified: Secondary | ICD-10-CM | POA: Diagnosis present

## 2021-07-11 DIAGNOSIS — Z7984 Long term (current) use of oral hypoglycemic drugs: Secondary | ICD-10-CM | POA: Diagnosis not present

## 2021-07-11 DIAGNOSIS — Z833 Family history of diabetes mellitus: Secondary | ICD-10-CM | POA: Diagnosis not present

## 2021-07-11 DIAGNOSIS — M71121 Other infective bursitis, right elbow: Secondary | ICD-10-CM | POA: Diagnosis present

## 2021-07-11 DIAGNOSIS — I5042 Chronic combined systolic (congestive) and diastolic (congestive) heart failure: Secondary | ICD-10-CM | POA: Diagnosis present

## 2021-07-11 DIAGNOSIS — Z8249 Family history of ischemic heart disease and other diseases of the circulatory system: Secondary | ICD-10-CM | POA: Diagnosis not present

## 2021-07-11 DIAGNOSIS — Z7982 Long term (current) use of aspirin: Secondary | ICD-10-CM | POA: Diagnosis not present

## 2021-07-11 DIAGNOSIS — E872 Acidosis: Secondary | ICD-10-CM | POA: Diagnosis present

## 2021-07-11 DIAGNOSIS — E11319 Type 2 diabetes mellitus with unspecified diabetic retinopathy without macular edema: Secondary | ICD-10-CM | POA: Diagnosis present

## 2021-07-11 DIAGNOSIS — Z20822 Contact with and (suspected) exposure to covid-19: Secondary | ICD-10-CM | POA: Diagnosis present

## 2021-07-11 DIAGNOSIS — I130 Hypertensive heart and chronic kidney disease with heart failure and stage 1 through stage 4 chronic kidney disease, or unspecified chronic kidney disease: Secondary | ICD-10-CM | POA: Diagnosis present

## 2021-07-11 DIAGNOSIS — F17210 Nicotine dependence, cigarettes, uncomplicated: Secondary | ICD-10-CM | POA: Diagnosis present

## 2021-07-11 DIAGNOSIS — E1169 Type 2 diabetes mellitus with other specified complication: Secondary | ICD-10-CM | POA: Diagnosis present

## 2021-07-11 DIAGNOSIS — Z23 Encounter for immunization: Secondary | ICD-10-CM | POA: Diagnosis present

## 2021-07-11 DIAGNOSIS — Z720 Tobacco use: Secondary | ICD-10-CM

## 2021-07-11 DIAGNOSIS — E0801 Diabetes mellitus due to underlying condition with hyperosmolarity with coma: Secondary | ICD-10-CM | POA: Diagnosis not present

## 2021-07-11 DIAGNOSIS — D849 Immunodeficiency, unspecified: Secondary | ICD-10-CM | POA: Diagnosis present

## 2021-07-11 HISTORY — DX: Other infective bursitis, right elbow: M71.121

## 2021-07-11 HISTORY — PX: INCISION AND DRAINAGE ABSCESS: SHX5864

## 2021-07-11 LAB — SURGICAL PCR SCREEN
MRSA, PCR: NEGATIVE
Staphylococcus aureus: POSITIVE — AB

## 2021-07-11 LAB — GLUCOSE, CAPILLARY
Glucose-Capillary: 69 mg/dL — ABNORMAL LOW (ref 70–99)
Glucose-Capillary: 120 mg/dL — ABNORMAL HIGH (ref 70–99)
Glucose-Capillary: 63 mg/dL — ABNORMAL LOW (ref 70–99)
Glucose-Capillary: 67 mg/dL — ABNORMAL LOW (ref 70–99)
Glucose-Capillary: 263 mg/dL — ABNORMAL HIGH (ref 70–99)

## 2021-07-11 LAB — BASIC METABOLIC PANEL
Anion gap: 9 (ref 5–15)
BUN: 23 mg/dL (ref 8–23)
CO2: 24 mmol/L (ref 22–32)
Calcium: 9.2 mg/dL (ref 8.9–10.3)
Chloride: 108 mmol/L (ref 98–111)
Creatinine, Ser: 1.05 mg/dL — ABNORMAL HIGH (ref 0.44–1.00)
GFR, Estimated: 59 mL/min — ABNORMAL LOW (ref >60)
Glucose, Bld: 103 mg/dL — ABNORMAL HIGH (ref 70–99)
Potassium: 3.8 mmol/L (ref 3.5–5.1)
Sodium: 141 mmol/L (ref 135–145)

## 2021-07-11 LAB — CBC WITH DIFFERENTIAL/PLATELET
Abs Immature Granulocytes: 0.02 10*3/uL (ref 0.00–0.07)
Basophils Absolute: 0.1 10*3/uL (ref 0.0–0.1)
Basophils Relative: 1 %
Eosinophils Absolute: 0.1 10*3/uL (ref 0.0–0.5)
Eosinophils Relative: 1 %
HCT: 48 % — ABNORMAL HIGH (ref 36.0–46.0)
Hemoglobin: 15 g/dL (ref 12.0–15.0)
Immature Granulocytes: 0 %
Lymphocytes Relative: 14 %
Lymphs Abs: 1.2 10*3/uL (ref 0.7–4.0)
MCH: 27.9 pg (ref 26.0–34.0)
MCHC: 31.3 g/dL (ref 30.0–36.0)
MCV: 89.2 fL (ref 80.0–100.0)
Monocytes Absolute: 0.5 10*3/uL (ref 0.1–1.0)
Monocytes Relative: 6 %
Neutro Abs: 6.4 10*3/uL (ref 1.7–7.7)
Neutrophils Relative %: 78 %
Platelets: 245 10*3/uL (ref 150–400)
RBC: 5.38 MIL/uL — ABNORMAL HIGH (ref 3.87–5.11)
RDW: 16 % — ABNORMAL HIGH (ref 11.5–15.5)
WBC: 8.3 10*3/uL (ref 4.0–10.5)
nRBC: 0 % (ref 0.0–0.2)

## 2021-07-11 LAB — RESP PANEL BY RT-PCR (FLU A&B, COVID) ARPGX2
Influenza A by PCR: NEGATIVE
Influenza B by PCR: NEGATIVE
SARS Coronavirus 2 by RT PCR: NEGATIVE

## 2021-07-11 SURGERY — INCISION AND DRAINAGE, ABSCESS
Anesthesia: General

## 2021-07-11 MED ORDER — PROPOFOL 10 MG/ML IV BOLUS
INTRAVENOUS | Status: DC | PRN
  Administered 2021-07-11: 140 mg via INTRAVENOUS

## 2021-07-11 MED ORDER — ATORVASTATIN CALCIUM 40 MG PO TABS
40.0000 mg | ORAL_TABLET | Freq: Every day | ORAL | Status: DC
  Administered 2021-07-11 – 2021-07-15 (×5): 40 mg via ORAL
  Filled 2021-07-11: qty 1
  Filled 2021-07-11: qty 2
  Filled 2021-07-11 (×3): qty 1

## 2021-07-11 MED ORDER — LIDOCAINE 2% (20 MG/ML) 5 ML SYRINGE
INTRAMUSCULAR | Status: AC
  Filled 2021-07-11: qty 5

## 2021-07-11 MED ORDER — OXYCODONE HCL 5 MG/5ML PO SOLN: 5.0000 mg | Freq: Once | ORAL | Status: DC | PRN

## 2021-07-11 MED ORDER — METOCLOPRAMIDE HCL 5 MG/ML IJ SOLN
5.0000 mg | Freq: Three times a day (TID) | INTRAMUSCULAR | Status: DC | PRN
  Administered 2021-07-12: 5 mg via INTRAVENOUS
  Filled 2021-07-11: qty 2

## 2021-07-11 MED ORDER — SODIUM CHLORIDE 0.9 % IV SOLN: INTRAVENOUS | Status: DC

## 2021-07-11 MED ORDER — ONDANSETRON HCL 4 MG PO TABS: 4.0000 mg | ORAL_TABLET | Freq: Four times a day (QID) | ORAL | Status: DC | PRN

## 2021-07-11 MED ORDER — HYDROCODONE-ACETAMINOPHEN 7.5-325 MG PO TABS
1.0000 | ORAL_TABLET | ORAL | Status: DC | PRN
  Administered 2021-07-11: 1 via ORAL
  Administered 2021-07-12: 2 via ORAL
  Filled 2021-07-11: qty 2
  Filled 2021-07-11: qty 1

## 2021-07-11 MED ORDER — HYDRALAZINE HCL 50 MG PO TABS
50.0000 mg | ORAL_TABLET | Freq: Three times a day (TID) | ORAL | Status: DC
  Administered 2021-07-11 – 2021-07-15 (×13): 50 mg via ORAL
  Filled 2021-07-11 (×13): qty 1

## 2021-07-11 MED ORDER — HYDROCODONE-ACETAMINOPHEN 5-325 MG PO TABS
1.0000 | ORAL_TABLET | ORAL | Status: DC | PRN
  Administered 2021-07-11: 2 via ORAL
  Filled 2021-07-11: qty 2

## 2021-07-11 MED ORDER — MORPHINE SULFATE (PF) 2 MG/ML IV SOLN: 2.0000 mg | INTRAVENOUS | Status: DC | PRN

## 2021-07-11 MED ORDER — SUGAMMADEX SODIUM 200 MG/2ML IV SOLN
INTRAVENOUS | Status: DC | PRN
  Administered 2021-07-11: 200 mg via INTRAVENOUS

## 2021-07-11 MED ORDER — ASPIRIN EC 81 MG PO TBEC
81.0000 mg | DELAYED_RELEASE_TABLET | ORAL | Status: DC
  Administered 2021-07-12 – 2021-07-14 (×2): 81 mg via ORAL
  Filled 2021-07-11 (×3): qty 1

## 2021-07-11 MED ORDER — OXYCODONE HCL 5 MG PO TABS: 5.0000 mg | ORAL_TABLET | Freq: Once | ORAL | Status: DC | PRN

## 2021-07-11 MED ORDER — VANCOMYCIN HCL 750 MG/150ML IV SOLN
750.0000 mg | INTRAVENOUS | Status: DC
  Administered 2021-07-11 – 2021-07-14 (×4): 750 mg via INTRAVENOUS
  Filled 2021-07-11 (×4): qty 150

## 2021-07-11 MED ORDER — ACETAMINOPHEN 10 MG/ML IV SOLN: 1000.0000 mg | Freq: Once | INTRAVENOUS | Status: DC | PRN

## 2021-07-11 MED ORDER — ONDANSETRON HCL 4 MG/2ML IJ SOLN: 4.0000 mg | Freq: Four times a day (QID) | INTRAMUSCULAR | Status: DC | PRN

## 2021-07-11 MED ORDER — SODIUM CHLORIDE 0.9 % IR SOLN
Status: DC | PRN
  Administered 2021-07-11: 3000 mL

## 2021-07-11 MED ORDER — BISACODYL 5 MG PO TBEC: 5.0000 mg | DELAYED_RELEASE_TABLET | Freq: Every day | ORAL | Status: DC | PRN

## 2021-07-11 MED ORDER — CEFAZOLIN SODIUM-DEXTROSE 2-4 GM/100ML-% IV SOLN: 2.0000 g | INTRAVENOUS | Status: DC

## 2021-07-11 MED ORDER — LACTATED RINGERS IV SOLN: INTRAVENOUS | Status: DC

## 2021-07-11 MED ORDER — POLYETHYLENE GLYCOL 3350 17 G PO PACK: 17.0000 g | PACK | Freq: Every day | ORAL | Status: DC | PRN

## 2021-07-11 MED ORDER — SPIRONOLACTONE 25 MG PO TABS
25.0000 mg | ORAL_TABLET | Freq: Every day | ORAL | Status: DC
  Administered 2021-07-12 – 2021-07-15 (×4): 25 mg via ORAL
  Filled 2021-07-11 (×4): qty 1

## 2021-07-11 MED ORDER — FENTANYL CITRATE (PF) 100 MCG/2ML IJ SOLN
25.0000 ug | INTRAMUSCULAR | Status: DC | PRN
  Administered 2021-07-11: 50 ug via INTRAVENOUS

## 2021-07-11 MED ORDER — BISACODYL 10 MG RE SUPP: 10.0000 mg | Freq: Every day | RECTAL | Status: DC | PRN

## 2021-07-11 MED ORDER — DIPHENHYDRAMINE HCL 12.5 MG/5ML PO ELIX
12.5000 mg | ORAL_SOLUTION | ORAL | Status: DC | PRN
  Administered 2021-07-12: 12.5 mg via ORAL
  Filled 2021-07-11: qty 5

## 2021-07-11 MED ORDER — ACETAMINOPHEN 160 MG/5ML PO SOLN: 1000.0000 mg | Freq: Once | ORAL | Status: DC | PRN

## 2021-07-11 MED ORDER — FENTANYL CITRATE (PF) 100 MCG/2ML IJ SOLN
INTRAMUSCULAR | Status: AC
  Filled 2021-07-11: qty 2

## 2021-07-11 MED ORDER — SODIUM CHLORIDE 0.9% FLUSH
3.0000 mL | Freq: Two times a day (BID) | INTRAVENOUS | Status: DC
  Administered 2021-07-11 – 2021-07-14 (×5): 3 mL via INTRAVENOUS

## 2021-07-11 MED ORDER — FENTANYL CITRATE (PF) 100 MCG/2ML IJ SOLN
INTRAMUSCULAR | Status: DC | PRN
  Administered 2021-07-11 (×2): 50 ug via INTRAVENOUS

## 2021-07-11 MED ORDER — ACETAMINOPHEN 500 MG PO TABS: 1000.0000 mg | ORAL_TABLET | Freq: Once | ORAL | Status: DC | PRN

## 2021-07-11 MED ORDER — CHLORHEXIDINE GLUCONATE 4 % EX LIQD: 60.0000 mL | Freq: Once | CUTANEOUS | Status: DC

## 2021-07-11 MED ORDER — SACUBITRIL-VALSARTAN 97-103 MG PO TABS
1.0000 | ORAL_TABLET | Freq: Two times a day (BID) | ORAL | Status: DC
  Administered 2021-07-11 – 2021-07-15 (×8): 1 via ORAL
  Filled 2021-07-11 (×9): qty 1

## 2021-07-11 MED ORDER — POVIDONE-IODINE 10 % EX SWAB: 2.0000 "application " | Freq: Once | CUTANEOUS | Status: DC

## 2021-07-11 MED ORDER — HYDRALAZINE HCL 20 MG/ML IJ SOLN: 5.0000 mg | INTRAMUSCULAR | Status: DC | PRN

## 2021-07-11 MED ORDER — ONDANSETRON HCL 4 MG/2ML IJ SOLN
INTRAMUSCULAR | Status: AC
  Filled 2021-07-11: qty 2

## 2021-07-11 MED ORDER — ACETAMINOPHEN 325 MG PO TABS: 325.0000 mg | ORAL_TABLET | Freq: Four times a day (QID) | ORAL | Status: DC | PRN

## 2021-07-11 MED ORDER — LIDOCAINE 2% (20 MG/ML) 5 ML SYRINGE
INTRAMUSCULAR | Status: DC | PRN
  Administered 2021-07-11: 100 mg via INTRAVENOUS

## 2021-07-11 MED ORDER — SODIUM CHLORIDE 0.9 % IV SOLN
2.0000 g | Freq: Two times a day (BID) | INTRAVENOUS | Status: DC
  Administered 2021-07-11 – 2021-07-13 (×5): 2 g via INTRAVENOUS
  Filled 2021-07-11 (×5): qty 2

## 2021-07-11 MED ORDER — DEXTROSE 50 % IV SOLN: 12.5000 g | INTRAVENOUS | Status: AC

## 2021-07-11 MED ORDER — MENTHOL 3 MG MT LOZG: 1.0000 | LOZENGE | OROMUCOSAL | Status: DC | PRN

## 2021-07-11 MED ORDER — DOCUSATE SODIUM 100 MG PO CAPS: 100.0000 mg | ORAL_CAPSULE | Freq: Two times a day (BID) | ORAL | Status: DC

## 2021-07-11 MED ORDER — ACETAMINOPHEN 650 MG RE SUPP: 650.0000 mg | Freq: Four times a day (QID) | RECTAL | Status: DC | PRN

## 2021-07-11 MED ORDER — TETANUS-DIPHTH-ACELL PERTUSSIS 5-2.5-18.5 LF-MCG/0.5 IM SUSY
0.5000 mL | PREFILLED_SYRINGE | Freq: Once | INTRAMUSCULAR | Status: AC
  Administered 2021-07-11: 0.5 mL via INTRAMUSCULAR
  Filled 2021-07-11: qty 0.5

## 2021-07-11 MED ORDER — DEXAMETHASONE SODIUM PHOSPHATE 4 MG/ML IJ SOLN
INTRAMUSCULAR | Status: DC | PRN
  Administered 2021-07-11: 4 mg via INTRAVENOUS

## 2021-07-11 MED ORDER — ISOSORBIDE MONONITRATE ER 60 MG PO TB24
60.0000 mg | ORAL_TABLET | Freq: Every day | ORAL | Status: DC
  Administered 2021-07-11 – 2021-07-15 (×5): 60 mg via ORAL
  Filled 2021-07-11 (×5): qty 1

## 2021-07-11 MED ORDER — PIPERACILLIN-TAZOBACTAM 3.375 G IVPB 30 MIN
3.3750 g | Freq: Once | INTRAVENOUS | Status: AC
  Administered 2021-07-11: 3.375 g via INTRAVENOUS
  Filled 2021-07-11: qty 50

## 2021-07-11 MED ORDER — PHENOL 1.4 % MT LIQD: 1.0000 | OROMUCOSAL | Status: DC | PRN

## 2021-07-11 MED ORDER — DOCUSATE SODIUM 100 MG PO CAPS
100.0000 mg | ORAL_CAPSULE | Freq: Two times a day (BID) | ORAL | Status: DC
  Administered 2021-07-12 – 2021-07-15 (×5): 100 mg via ORAL
  Filled 2021-07-11 (×7): qty 1

## 2021-07-11 MED ORDER — ROCURONIUM BROMIDE 10 MG/ML (PF) SYRINGE
PREFILLED_SYRINGE | INTRAVENOUS | Status: AC
  Filled 2021-07-11: qty 10

## 2021-07-11 MED ORDER — METHOCARBAMOL 500 MG IVPB - SIMPLE MED
500.0000 mg | Freq: Four times a day (QID) | INTRAVENOUS | Status: DC | PRN
  Filled 2021-07-11: qty 50

## 2021-07-11 MED ORDER — FENTANYL CITRATE (PF) 100 MCG/2ML IJ SOLN
INTRAMUSCULAR | Status: AC
  Administered 2021-07-11: 50 ug via INTRAVENOUS
  Filled 2021-07-11: qty 2

## 2021-07-11 MED ORDER — HYDROCODONE-ACETAMINOPHEN 5-325 MG PO TABS
1.0000 | ORAL_TABLET | ORAL | Status: DC | PRN
  Administered 2021-07-11: 1 via ORAL
  Filled 2021-07-11: qty 1

## 2021-07-11 MED ORDER — METHOCARBAMOL 500 MG PO TABS: 500.0000 mg | ORAL_TABLET | Freq: Four times a day (QID) | ORAL | Status: DC | PRN

## 2021-07-11 MED ORDER — METOPROLOL SUCCINATE ER 50 MG PO TB24
50.0000 mg | ORAL_TABLET | Freq: Every day | ORAL | Status: DC
  Administered 2021-07-11 – 2021-07-15 (×5): 50 mg via ORAL
  Filled 2021-07-11 (×5): qty 1

## 2021-07-11 MED ORDER — BUPIVACAINE HCL (PF) 0.5 % IJ SOLN
INTRAMUSCULAR | Status: AC
  Filled 2021-07-11: qty 30

## 2021-07-11 MED ORDER — DEXTROSE 50 % IV SOLN
INTRAVENOUS | Status: AC
  Administered 2021-07-11: 12.5 g via INTRAVENOUS
  Filled 2021-07-11: qty 50

## 2021-07-11 MED ORDER — INSULIN ASPART 100 UNIT/ML IJ SOLN
0.0000 [IU] | Freq: Three times a day (TID) | INTRAMUSCULAR | Status: DC
  Administered 2021-07-12: 2 [IU] via SUBCUTANEOUS
  Administered 2021-07-12: 3 [IU] via SUBCUTANEOUS
  Administered 2021-07-13: 2 [IU] via SUBCUTANEOUS
  Administered 2021-07-13: 5 [IU] via SUBCUTANEOUS
  Administered 2021-07-14: 2 [IU] via SUBCUTANEOUS
  Administered 2021-07-14 (×2): 3 [IU] via SUBCUTANEOUS
  Administered 2021-07-15: 5 [IU] via SUBCUTANEOUS
  Administered 2021-07-15: 3 [IU] via SUBCUTANEOUS

## 2021-07-11 MED ORDER — DEXAMETHASONE SODIUM PHOSPHATE 10 MG/ML IJ SOLN
INTRAMUSCULAR | Status: AC
  Filled 2021-07-11: qty 1

## 2021-07-11 MED ORDER — ACETAMINOPHEN 325 MG PO TABS: 650.0000 mg | ORAL_TABLET | Freq: Four times a day (QID) | ORAL | Status: DC | PRN

## 2021-07-11 MED ORDER — INSULIN ASPART 100 UNIT/ML IJ SOLN
0.0000 [IU] | Freq: Every day | INTRAMUSCULAR | Status: DC
  Administered 2021-07-11 – 2021-07-14 (×3): 3 [IU] via SUBCUTANEOUS

## 2021-07-11 MED ORDER — MUPIROCIN 2 % EX OINT
1.0000 "application " | TOPICAL_OINTMENT | Freq: Two times a day (BID) | CUTANEOUS | Status: DC
  Administered 2021-07-11 – 2021-07-15 (×6): 1 via NASAL
  Filled 2021-07-11 (×4): qty 22

## 2021-07-11 MED ORDER — HYDRALAZINE HCL 20 MG/ML IJ SOLN
10.0000 mg | INTRAMUSCULAR | Status: DC | PRN
  Administered 2021-07-11: 10 mg via INTRAVENOUS
  Filled 2021-07-11: qty 1

## 2021-07-11 MED ORDER — VANCOMYCIN HCL IN DEXTROSE 1-5 GM/200ML-% IV SOLN
1000.0000 mg | Freq: Once | INTRAVENOUS | Status: AC
  Administered 2021-07-11: 1000 mg via INTRAVENOUS
  Filled 2021-07-11: qty 200

## 2021-07-11 MED ORDER — DEXAMETHASONE SODIUM PHOSPHATE 10 MG/ML IJ SOLN
10.0000 mg | Freq: Once | INTRAMUSCULAR | Status: AC
  Administered 2021-07-12: 10 mg via INTRAVENOUS
  Filled 2021-07-11: qty 1

## 2021-07-11 MED ORDER — METOCLOPRAMIDE HCL 5 MG PO TABS: 5.0000 mg | ORAL_TABLET | Freq: Three times a day (TID) | ORAL | Status: DC | PRN

## 2021-07-11 MED ORDER — ROCURONIUM BROMIDE 10 MG/ML (PF) SYRINGE
PREFILLED_SYRINGE | INTRAVENOUS | Status: DC | PRN
  Administered 2021-07-11: 50 mg via INTRAVENOUS

## 2021-07-11 MED ORDER — ONDANSETRON HCL 4 MG/2ML IJ SOLN
INTRAMUSCULAR | Status: DC | PRN
  Administered 2021-07-11: 4 mg via INTRAVENOUS

## 2021-07-11 MED ORDER — MORPHINE SULFATE (PF) 4 MG/ML IV SOLN: 0.5000 mg | INTRAVENOUS | Status: DC | PRN

## 2021-07-11 SURGICAL SUPPLY — 41 items
BAG COUNTER SPONGE SURGICOUNT (BAG) IMPLANT
BAG SPEC THK2 15X12 ZIP CLS (MISCELLANEOUS) ×1
BAG SPNG CNTER NS LX DISP (BAG)
BAG ZIPLOCK 12X15 (MISCELLANEOUS) ×2 IMPLANT
BANDAGE ESMARK 6X9 LF (GAUZE/BANDAGES/DRESSINGS) ×1 IMPLANT
BLADE SAW SGTL 11.0X1.19X90.0M (BLADE) IMPLANT
BNDG CMPR 9X6 STRL LF SNTH (GAUZE/BANDAGES/DRESSINGS) ×1
BNDG ELASTIC 4X5.8 VLCR STR LF (GAUZE/BANDAGES/DRESSINGS) ×1 IMPLANT
BNDG ESMARK 6X9 LF (GAUZE/BANDAGES/DRESSINGS) ×2
BNDG GAUZE ELAST 4 BULKY (GAUZE/BANDAGES/DRESSINGS) ×2 IMPLANT
COVER SURGICAL LIGHT HANDLE (MISCELLANEOUS) ×2 IMPLANT
CUFF TOURN SGL QUICK 18X4 (TOURNIQUET CUFF) ×2 IMPLANT
CUFF TOURN SGL QUICK 24 (TOURNIQUET CUFF) ×2
CUFF TOURN SGL QUICK 34 (TOURNIQUET CUFF) ×2
CUFF TRNQT CYL 24X4X16.5-23 (TOURNIQUET CUFF) ×1 IMPLANT
CUFF TRNQT CYL 34X4.125X (TOURNIQUET CUFF) ×1 IMPLANT
DRAIN PENROSE 0.5X18 (DRAIN) ×2 IMPLANT
DRSG PAD ABDOMINAL 8X10 ST (GAUZE/BANDAGES/DRESSINGS) ×2 IMPLANT
DURAPREP 26ML APPLICATOR (WOUND CARE) ×2 IMPLANT
ELECT REM PT RETURN 15FT ADLT (MISCELLANEOUS) ×2 IMPLANT
GAUZE SPONGE 4X4 12PLY STRL (GAUZE/BANDAGES/DRESSINGS) ×2 IMPLANT
GAUZE XEROFORM 1X8 LF (GAUZE/BANDAGES/DRESSINGS) ×1 IMPLANT
GLOVE SURG ENC TEXT LTX SZ7 (GLOVE) IMPLANT
GLOVE SURG ORTHO LTX SZ8 (GLOVE) ×2 IMPLANT
GLOVE SURG UNDER POLY LF SZ7.5 (GLOVE) ×6 IMPLANT
GOWN STRL REUS W/TWL LRG LVL3 (GOWN DISPOSABLE) ×2 IMPLANT
HANDPIECE INTERPULSE COAX TIP (DISPOSABLE) ×2
KIT BASIN OR (CUSTOM PROCEDURE TRAY) ×2 IMPLANT
KIT TURNOVER KIT A (KITS) ×2 IMPLANT
MANIFOLD NEPTUNE II (INSTRUMENTS) ×2 IMPLANT
PACK ORTHO EXTREMITY (CUSTOM PROCEDURE TRAY) ×2 IMPLANT
PAD CAST 4YDX4 CTTN HI CHSV (CAST SUPPLIES) ×1 IMPLANT
PADDING CAST ABS 4INX4YD NS (CAST SUPPLIES) ×1
PADDING CAST ABS COTTON 4X4 ST (CAST SUPPLIES) IMPLANT
PADDING CAST COTTON 4X4 STRL (CAST SUPPLIES) ×2
PENCIL SMOKE EVACUATOR (MISCELLANEOUS) IMPLANT
PROTECTOR NERVE ULNAR (MISCELLANEOUS) ×2 IMPLANT
SET HNDPC FAN SPRY TIP SCT (DISPOSABLE) ×1 IMPLANT
SOL PREP PROV IODINE SCRUB 4OZ (MISCELLANEOUS) ×2 IMPLANT
SYR CONTROL 10ML LL (SYRINGE) ×2 IMPLANT
TOWEL OR 17X26 10 PK STRL BLUE (TOWEL DISPOSABLE) ×4 IMPLANT

## 2021-07-11 NOTE — ED Notes (Signed)
MD at bedside. 

## 2021-07-11 NOTE — Anesthesia Procedure Notes (Signed)
Procedure Name: Intubation Date/Time: 07/11/2021 3:15 PM Performed by: Montel Clock, CRNA Pre-anesthesia Checklist: Patient identified, Emergency Drugs available, Suction available, Patient being monitored and Timeout performed Patient Re-evaluated:Patient Re-evaluated prior to induction Oxygen Delivery Method: Circle system utilized Preoxygenation: Pre-oxygenation with 100% oxygen Induction Type: IV induction Ventilation: Mask ventilation without difficulty and Oral airway inserted - appropriate to patient size Laryngoscope Size: Mac and 3 Grade View: Grade II Tube type: Oral Tube size: 7.0 mm Number of attempts: 1 Airway Equipment and Method: Stylet Placement Confirmation: ETT inserted through vocal cords under direct vision, positive ETCO2 and breath sounds checked- equal and bilateral Secured at: 21 cm Tube secured with: Tape Dental Injury: Teeth and Oropharynx as per pre-operative assessment

## 2021-07-11 NOTE — Interval H&P Note (Signed)
History and Physical Interval Note:  07/11/2021 12:03 PM  Maria Foster  has presented today for surgery, with the diagnosis of INFECTED RIGHT OLECRANON BURSITIS.  The various methods of treatment have been discussed with the patient and family. After consideration of risks, benefits and other options for treatment, the patient has consented to  Procedure(s): INCISION AND DRAINAGE RIGHT ELBOW (N/A) as a surgical intervention.  The patient's history has been reviewed, patient examined, no change in status, stable for surgery.  I have reviewed the patient's chart and labs.  Questions were answered to the patient's satisfaction.     [name]

## 2021-07-11 NOTE — Op Note (Signed)
Maria Foster, WINEGAR MEDICAL RECORD NO: [id number] ACCOUNT NO: [id number] DATE OF BIRTH: 26-Mar-1956 FACILITY: [facility] LOCATION: [id number] PHYSICIAN: Madlyn Frankel. [name], MD  Operative Report   DATE OF PROCEDURE: 07/11/2021  PREOPERATIVE DIAGNOSES: 1.  Infected right olecranon bursa. 2.  Right elbow olecranon rheumatoid nodules.  POSTOPERATIVE DIAGNOSES:  1.  Infected right olecranon bursa. 2.  Right elbow olecranon rheumatoid nodules.  PROCEDURES: 1.  Excisional and non-excisional debridement of right elbow olecranon. 2.  Excision of right elbow rheumatoid nodules.  SURGEON:  Madlyn Frankel. [name], MD  ASSISTANT:  Rosalene Billings, PA-C.  Note that Ms. Domenic Schwab was present for the entirety of the case for preoperative positioning, perioperative management of the operative extremity, general facilitation of the case, and primary wound closure.  ANESTHESIA:  General.  BLOOD LOSS:  About 20 mL.  BLOOD GIVEN:  None.  DRAINS:  One quarter-inch Penrose drain was placed into the olecranon space to be removed on Thursday 07/13/2021.  TOURNIQUET:  Tourniquet was placed, but not utilized.  INDICATIONS FOR PROCEDURE:  The patient is a 65 year old female with history of rheumatoid arthritis.  She apparently was seen and evaluated recently by her rheumatologist.  In an attempt to decrease symptoms associated with what would appear to be an  olecranon bursitis, apparently this area was injected with steroids.  She began having increasing onset of pain, warmth, and swelling by Sunday, the 21st.  She presented to the emergency room on Monday, the 22nd, with increasing pain and then drainage.   She was admitted to the hospitalist service.  Orthopedics was consulted for management.  We discussed the need for debridement of this area and the challenges associated with chronic bursitis.  Risks of persistent infection and need for future surgeries  were discussed and reviewed.  Consent was obtained for management of  her septic olecranon bursitis.  PROCEDURE IN DETAIL:  The patient was brought to the operative theater.  Once adequate anesthesia, preoperative antibiotics already had been administered since her admission including vancomycin and cefepime, she was positioned into a sloppy lateral  position with the right side up.  A right proximal arm tourniquet was placed.  The right upper extremity was then prepped and draped in sterile fashion.  A timeout was performed identifying the patient, planned procedure, and extremity.  Initially, I  performed a preliminary excision of the soft tissues and edges of the skin that were nonviable extending approximately a total of 3-4 cm.  This initial dissection was carried out with a sharp scalpel blade.  I carried this dissection deep excising  multiple rheumatoid nodules that were in this bursal sac region.  I then used the Bovie to further debride nonviable tissue and provide some cauterization.  Once I had excised the entire bursal tissue visually, we irrigated the wound with normal saline  solution.  Once we did this and decompressed the bursal tissue as well as rheumatoid nodules, I elected then to remove further skin from this.  Again, using a sharp 15 blade scalpel, I excised skin edges again over the extent of the elliptical incision.   Once this was done, we got a quarter-inch Penrose drain.  I then reapproximated the skin edges using 2-0 nylon.  We placed the Penrose drain into the wound with a portion of it out to allow for it to be removed.  Care was taken during the suturing to  make certain that the drain would not be sewn into place.  Once this was done, we cleaned  up her wound.  I dressed the wound with Xeroform and then a bulky dressing and placed her into a posterior splint with the elbow in approximately 40 degrees.  She  was awoken from anesthesia and brought to the recovery room in stable condition.  Postoperatively, we will plan to see her on rounds.  We  will most likely take her dressing down on Thursday and remove the Penrose drain.  We will continue dressing her wound with the splint and follow up with her in the office in 2 weeks for suture  removal.  We will select antibiotics prior to discharge to be used orally.   [facility] D: 07/11/2021 4:45:38 pm T: 07/11/2021 9:22:00 pm  JOB: [id number]/ [id number]

## 2021-07-11 NOTE — Transfer of Care (Signed)
Immediate Anesthesia Transfer of Care Note  Patient: Maria Foster  Procedure(s) Performed: INCISION AND DRAINAGE RIGHT ELBOW  Patient Location: PACU  Anesthesia Type:General  Level of Consciousness: drowsy and patient cooperative  Airway & Oxygen Therapy: Patient Spontanous Breathing and Patient connected to face mask oxygen  Post-op Assessment: Report given to RN and Post -op Vital signs reviewed and stable  Post vital signs: Reviewed and stable  Last Vitals:  Vitals Value Taken Time  BP 200/87 07/11/21 1630  Temp    Pulse 88 07/11/21 1635  Resp 20 07/11/21 1635  SpO2 100 % 07/11/21 1635  Vitals shown include unvalidated device data.  Last Pain:  Vitals:   07/11/21 0955  TempSrc:   PainSc: 8       Patients Stated Pain Goal: 4 (07/11/21 0955)  Complications: No notable events documented.

## 2021-07-11 NOTE — Anesthesia Postprocedure Evaluation (Signed)
Anesthesia Post Note  Patient: Maria Foster  Procedure(s) Performed: INCISION AND DRAINAGE RIGHT ELBOW     Patient location during evaluation: PACU Anesthesia Type: General Level of consciousness: awake and alert Pain management: pain level controlled Vital Signs Assessment: post-procedure vital signs reviewed and stable Respiratory status: spontaneous breathing, nonlabored ventilation and respiratory function stable Cardiovascular status: blood pressure returned to baseline and stable Postop Assessment: no apparent nausea or vomiting Anesthetic complications: no   No notable events documented.  Last Vitals:  Vitals:   07/11/21 0826 07/11/21 1038  BP: (!) 182/77 139/68  Pulse: 70 72  Resp: 16 16  Temp: 36.5 C   SpO2: 97% 97%    Last Pain:  Vitals:   07/11/21 0955  TempSrc:   PainSc: 8                  Lucretia Kern

## 2021-07-11 NOTE — Progress Notes (Signed)
Pharmacy Antibiotic Note  Maria Foster is a 65 y.o. female admitted on 07/10/2021 with  septic arthritis .  Patient presents with 8 days of R elbow pain, s/p steroid injections but now area is draining purulent material.  Ortho planning to take patient to OR for elbow washout.  Pharmacy has been consulted for vancomycin and cefepime dosing.  Received vancomycin 1g IV x1 and zosyn 3.375g IV x1 at 0129 this AM.  Since patient did not receive full vancomycin load, will go ahead and administer next dose of antibiotics.    Plan: Vancomycin 750 mg IV q24 hours (eAUC 423, goal 400-550, Scr 1.05, Vd 0.72) Cefepime 2g IV q12 hours F/u culture data, clinical improvement, renal function F/u post-surgical antibiotics plan  Height: 5\' 3"  (160 cm) Weight: 59.9 kg (132 lb) IBW/kg (Calculated) : 52.4  Temp (24hrs), Avg:97.9 F (36.6 C), Min:97.7 F (36.5 C), Max:98.1 F (36.7 C)  Recent Labs  Lab 07/10/21 2354  WBC 8.3  CREATININE 1.05*    Estimated Creatinine Clearance: 44.2 mL/min (A) (by C-G formula based on SCr of 1.05 mg/dL (H)).    Allergies  Allergen Reactions   Metformin And Related Other (See Comments)    Kidney injury, GI upset, diarrhea    Antimicrobials this admission: Vancomycin 8/23 >>  Cefepime 8/23 >>   Dose adjustments this admission: N/A  Microbiology results: 8/23 BCx: sent 8/23 MRSA PCR: sent  Thank you for allowing pharmacy to be a part of this patient's care.  9/23, PharmD 07/11/2021 10:07 AM

## 2021-07-11 NOTE — Discharge Instructions (Addendum)
INSTRUCTIONS AFTER SURGERY  INSTRUCTIONS FOR BANDAGE/INCISION CARE:  Please perform every other day, or daily if soiled  - Wash hands, and put on gloves if available - Remove the outer ACE wrap - then take off the hard splint which will be bent and set to the side - Remove the white wrap, and the gauze on top of the elbow incision - Do not clean the incision - Place a new piece of medicated gauze (Xeroform, it is yellow), you may cut this to a smaller size - Then place new gauze and wrap the arm loosely with kerlex or cast padding (white wrap) - Place splint back on arm, and wrap the ACE wrap around it - Ensure it is not too tight and her fingers are not feeling numb/tingly or pale  Remove items at home which could result in a fall. This includes throw rugs or furniture in walking pathways ICE to the affected joint every three hours while awake for 30 minutes at a time, for at least the first 3-5 days, and then as needed for pain and swelling.  Continue to use ice for pain and swelling. You may notice swelling that will progress down to the foot and ankle.  This is normal after surgery.  Elevate your leg when you are not up walking on it.   Continue to use the breathing machine you got in the hospital (incentive spirometer) which will help keep your temperature down.  It is common for your temperature to cycle up and down following surgery, especially at night when you are not up moving around and exerting yourself.  The breathing machine keeps your lungs expanded and your temperature down.   DIET:  As you were doing prior to hospitalization, we recommend a well-balanced diet.  DRESSING / WOUND CARE / SHOWERING  Keep the right arm clean and dry until follow up. May wash hand and upper arm, but avoid getting the dressings wet.   ACTIVITY  Increase activity slowly as tolerated, but follow the weight bearing instructions below.   No driving for 6 weeks or until further direction given by  your physician.  You cannot drive while taking narcotics.  No lifting or carrying greater than 10 lbs. until further directed by your surgeon. Avoid periods of inactivity such as sitting longer than an hour when not asleep. This helps prevent blood clots.  You may return to work once you are authorized by your doctor.     WEIGHT BEARING   Other:  Nonweightbearing  CONSTIPATION  Constipation is defined medically as fewer than three stools per week and severe constipation as less than one stool per week.  Even if you have a regular bowel pattern at home, your normal regimen is likely to be disrupted due to multiple reasons following surgery.  Combination of anesthesia, postoperative narcotics, change in appetite and fluid intake all can affect your bowels.   YOU MUST use at least one of the following options; they are listed in order of increasing strength to get the job done.  They are all available over the counter, and you may need to use some, POSSIBLY even all of these options:    Drink plenty of fluids (prune juice may be helpful) and high fiber foods Colace 100 mg by mouth twice a day  Senokot for constipation as directed and as needed Dulcolax (bisacodyl), take with full glass of water  Miralax (polyethylene glycol) once or twice a day as needed.  If you have tried  all these things and are unable to have a bowel movement in the first 3-4 days after surgery call either your surgeon or your primary doctor.    If you experience loose stools or diarrhea, hold the medications until you stool forms back up.  If your symptoms do not get better within 1 week or if they get worse, check with your doctor.  If you experience "the worst abdominal pain ever" or develop nausea or vomiting, please contact the office immediately for further recommendations for treatment.   ITCHING:  If you experience itching with your medications, try taking only a single pain pill, or even half a pain pill at a  time.  You can also use Benadryl over the counter for itching or also to help with sleep.   TED HOSE STOCKINGS:  Use stockings on both legs until for at least 2 weeks or as directed by physician office. They may be removed at night for sleeping.  MEDICATIONS:  See your medication summary on the "After Visit Summary" that nursing will review with you.  You may have some home medications which will be placed on hold until you complete the course of blood thinner medication.  It is important for you to complete the blood thinner medication as prescribed.  PRECAUTIONS:  If you experience chest pain or shortness of breath - call 911 immediately for transfer to the hospital emergency department.   If you develop a fever greater that 101 F, purulent drainage from wound, increased redness or drainage from wound, foul odor from the wound/dressing, or calf pain - CONTACT YOUR SURGEON.                                                   FOLLOW-UP APPOINTMENTS:  If you do not already have a post-op appointment, please call the office for an appointment to be seen by your surgeon.  Guidelines for how soon to be seen are listed in your "After Visit Summary", but are typically between 1-4 weeks after surgery.  POST-OPERATIVE OPIOID TAPER INSTRUCTIONS: It is important to wean off of your opioid medication as soon as possible. If you do not need pain medication after your surgery it is ok to stop day one. Opioids include: Codeine, Hydrocodone(Norco, Vicodin), Oxycodone(Percocet, oxycontin) and hydromorphone amongst others.  Long term and even short term use of opiods can cause: Increased pain response Dependence Constipation Depression Respiratory depression And more.  Withdrawal symptoms can include Flu like symptoms Nausea, vomiting And more Techniques to manage these symptoms Hydrate well Eat regular healthy meals Stay active Use relaxation techniques(deep breathing, meditating, yoga) Do Not  substitute Alcohol to help with tapering If you have been on opioids for less than two weeks and do not have pain than it is ok to stop all together.  Plan to wean off of opioids This plan should start within one week post op of your joint replacement. Maintain the same interval or time between taking each dose and first decrease the dose.  Cut the total daily intake of opioids by one tablet each day Next start to increase the time between doses. The last dose that should be eliminated is the evening dose.   MAKE SURE YOU:  Understand these instructions.  Get help right away if you are not doing well or get worse.    Thank  you for letting us be a part of your medical care team.  It is a privilege we respect greatly.  We hope these instructions will help you stay on track for a fast and full recovery!

## 2021-07-11 NOTE — Brief Op Note (Signed)
07/10/2021 - 07/11/2021  4:34 PM  PATIENT:  Maria Foster  65 y.o. 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 Excision of right elbow rheumatoid nodule  SURGEON:  Surgeon(s) and Role:    Durene Romans, MD - Primary  PHYSICIAN ASSISTANT: Rosalene Billings, PA-C  ANESTHESIA:   general  EBL:  20 mL   BLOOD ADMINISTERED:none  DRAINS: none   LOCAL MEDICATIONS USED:  NONE  SPECIMEN:  Source of Specimen:  right olecranon bursal tissue and fluid  DISPOSITION OF SPECIMEN:  PATHOLOGY  COUNTS:  YES  TOURNIQUET:  not used  DICTATION: .Other Dictation: Dictation Number [id number]  PLAN OF CARE: Admit to inpatient   PATIENT DISPOSITION:  PACU - hemodynamically stable.   Delay start of Pharmacological VTE agent (>24hrs) due to surgical blood loss or risk of bleeding: no

## 2021-07-11 NOTE — H&P (View-Only) (Signed)
Reason for Consult: Septic olecranon bursitis Referring Physician: Loney Loh, MD  Maria Foster is an 65 y.o. female.  HPI: This is a new problem. The current episode started more than 1 week ago. The problem occurs constantly. The problem has been gradually worsening. Pertinent negatives include no chest pain, no abdominal pain, no headaches and no shortness of breath. Nothing aggravates the symptoms. Nothing relieves the symptoms. She has tried nothing for the symptoms. The treatment provided no relief.  Patient with rheumatoid arthritis, DM and CHF reportedly on Humira presents with 8 days of R elbow pain.  States pain started and last Monday her rheumatologist injected steroids in the elbow and then on Sunday pain worsened and then began draining purulent material   Past Medical History:  Diagnosis Date   Arthritis    Cataract    Diabetes mellitus type 2 with complications (HCC)    diagnosed probably 2012 per patient   Diabetic retinopathy (HCC)    Hypertension    Rheumatoid arthritis (HCC)     Past Surgical History:  Procedure Laterality Date   PRP Bilateral OD - 07/23/2017 OS - 08/06/2017   RIGHT/LEFT HEART CATH AND CORONARY ANGIOGRAPHY N/A 01/05/2021   Procedure: RIGHT/LEFT HEART CATH AND CORONARY ANGIOGRAPHY;  Surgeon: Dolores Patty, MD;  Location: [facility];  Service: Cardiovascular;  Laterality: N/A;    Family History  Problem Relation Age of Onset   Diabetes Mother    Hypertension Mother    Heart Problems Sister    Amblyopia Neg Hx    Blindness Neg Hx    Cataracts Neg Hx    Glaucoma Neg Hx    Macular degeneration Neg Hx    Retinal detachment Neg Hx    Strabismus Neg Hx    Retinitis pigmentosa Neg Hx     Social History:  reports that she has been smoking cigarettes. She has a 7.50 pack-year smoking history. She has never used smokeless tobacco. She reports that she does not currently use alcohol. She reports that she does not use drugs.  Allergies:   Allergies  Allergen Reactions   Metformin And Related Other (See Comments)    Kidney injury, GI upset, diarrhea    Medications: I have reviewed the patient's current medications. Scheduled:  Results for orders placed or performed during the hospital encounter of 07/10/21 (from the past 24 hour(s))  Basic metabolic panel     Status: Abnormal   Collection Time: 07/10/21 11:54 PM  Result Value Ref Range   Sodium 141 135 - 145 mmol/L   Potassium 3.8 3.5 - 5.1 mmol/L   Chloride 108 98 - 111 mmol/L   CO2 24 22 - 32 mmol/L   Glucose, Bld 103 (H) 70 - 99 mg/dL   BUN 23 8 - 23 mg/dL   Creatinine, Ser 1.09 (H) 0.44 - 1.00 mg/dL   Calcium 9.2 8.9 - 32.3 mg/dL   GFR, Estimated 59 (L) >60 mL/min   Anion gap 9 5 - 15  CBC with Differential     Status: Abnormal   Collection Time: 07/10/21 11:54 PM  Result Value Ref Range   WBC 8.3 4.0 - 10.5 K/uL   RBC 5.38 (H) 3.87 - 5.11 MIL/uL   Hemoglobin 15.0 12.0 - 15.0 g/dL   HCT 55.7 (H) 32.2 - 02.5 %   MCV 89.2 80.0 - 100.0 fL   MCH 27.9 26.0 - 34.0 pg   MCHC 31.3 30.0 - 36.0 g/dL   RDW 42.7 (H) 06.2 -  15.5 %   Platelets 245 150 - 400 K/uL   nRBC 0.0 0.0 - 0.2 %   Neutrophils Relative % 78 %   Neutro Abs 6.4 1.7 - 7.7 K/uL   Lymphocytes Relative 14 %   Lymphs Abs 1.2 0.7 - 4.0 K/uL   Monocytes Relative 6 %   Monocytes Absolute 0.5 0.1 - 1.0 K/uL   Eosinophils Relative 1 %   Eosinophils Absolute 0.1 0.0 - 0.5 K/uL   Basophils Relative 1 %   Basophils Absolute 0.1 0.0 - 0.1 K/uL   Immature Granulocytes 0 %   Abs Immature Granulocytes 0.02 0.00 - 0.07 K/uL  Resp Panel by RT-PCR (Flu A&B, Covid) Nasopharyngeal Swab     Status: None   Collection Time: 07/11/21  7:54 AM   Specimen: Nasopharyngeal Swab; Nasopharyngeal(NP) swabs in vial transport medium  Result Value Ref Range   SARS Coronavirus 2 by RT PCR NEGATIVE NEGATIVE   Influenza A by PCR NEGATIVE NEGATIVE   Influenza B by PCR NEGATIVE NEGATIVE     X-ray: CLINICAL DATA:   Swelling, drainage, history of rheumatoid arthritis   EXAM: RIGHT ELBOW - COMPLETE 3+ VIEW   COMPARISON:  None.   FINDINGS: There is no acute fracture or dislocation. Elbow alignment is maintained.   There is soft tissue swelling along the dorsal aspect of the elbow with focal outpouching overlying the proximal radius. There is no definite soft tissue gas. There is no elbow effusion.   IMPRESSION: 1. Soft tissue swelling over the dorsum of the elbow with focal outpouching over the proximal radius. Targeted ultrasound may be useful to assess for drainable fluid collection. 2. No acute osseous abnormality.     Electronically Signed   By: Lesia Hausen M.D.  ROS As per HPI  Blood pressure (!) 182/77, pulse 70, temperature 97.7 F (36.5 C), resp. rate 16, height 5\' 3"  (1.6 m), weight 59.9 kg, SpO2 97 %.  Physical Exam: Vitals and nursing note reviewed.  Constitutional:      General: She is not in acute distress.    Appearance: Normal appearance.  HENT:     Head: Normocephalic and atraumatic.     Nose: Nose normal.  Eyes:     Conjunctiva/sclera: Conjunctivae normal.     Pupils: Pupils are equal, round, and reactive to light.  Cardiovascular:     Rate and Rhythm: Normal rate and regular rhythm.     Pulses: Normal pulses.     Heart sounds: Normal heart sounds.  Pulmonary:     Effort: Pulmonary effort is normal.     Breath sounds: Normal breath sounds.  Abdominal:     General: Abdomen is flat. Bowel sounds are normal.     Palpations: Abdomen is soft.     Tenderness: There is no abdominal tenderness. There is no guarding.  Musculoskeletal:        General: Tenderness present.       Arms:    Cervical back: Normal range of motion and neck supple.  Skin:    Capillary Refill: Capillary refill takes less than 2 seconds.     Findings: Erythema present.  Neurological:     Mental Status: She is alert.     Deep Tendon Reflexes: Reflexes normal.  Psychiatric:        Mood  and Affect: Mood normal.        Behavior: Behavior normal.   Assessment/Plan: Right septic olecranon bursitis  Plan: Needs to have her elbow washed out I will  examine elbow in pre-op due to performing elective surgery  NPO Will take cultures in OR Started on empiric antibiotics  [name] 07/11/2021, 9:08 AM

## 2021-07-11 NOTE — H&P (Signed)
History and Physical    Maria Foster IAX:[id number] DOB: [birth year] DOA: 07/10/2021  PCP: Carlena Hurl, PA-C Consultants:  [facility] - rheumatology; [facility] - cardiology Patient coming from:  Home - lives with husband; NOK: Husband, [phone]  Chief Complaint: elbow infection  HPI: Maria Foster is a 65 y.o. female with medical history significant of DM; HTN; and RA on Plaquenil presenting with R elbow infection following steroid injection a week ago.   She saw Dr. Benjamine Mola recently (last Monday) and he gave her a steroid shot because her arm was really sore.  On Sunday AM, she awoke and that little knot on her arm busted and there was stuff leaking out of it.  She sent a picture yesterday to Dr. Benjamine Mola and he encouraged her to come to the ER for concern of infection.  No fever.  She has had RA for >10 years.  She was recently started on Humira, maybe in June.  Symptoms are primarily in B hands and R elbow.    ED Course: Carryover, per Dr. Marlowe Sax:  Patient with history of diabetes, CHF, rheumatoid arthritis on Humira.  She was recently seen by rheumatology for pain in her right elbow and was given a steroid injection.  Symptoms have worsened now and having purulent drainage from this elbow.  ED physician spoke to Dr. Alvan Dame from orthopedics who advised keeping the patient n.p.o. and she will be taken to the operating room in the morning.  No signs of sepsis.  She was given antibiotics.  Review of Systems: As per HPI; otherwise review of systems reviewed and negative.   Ambulatory Status:  Ambulates without assistance  COVID Vaccine Status:  Complete plus booster  Past Medical History:  Diagnosis Date   Arthritis    Cataract    Diabetes mellitus type 2 with complications ([place])    diagnosed probably 2012 per patient   Diabetic retinopathy ([place])    Hypertension    Rheumatoid arthritis ([place])     Past Surgical History:  Procedure Laterality Date   PRP Bilateral OD - 07/23/2017 OS -  08/06/2017   RIGHT/LEFT HEART CATH AND CORONARY ANGIOGRAPHY N/A 01/05/2021   Procedure: RIGHT/LEFT HEART CATH AND CORONARY ANGIOGRAPHY;  Surgeon: Jolaine Artist, MD;  Location: [place] [facility];  Service: Cardiovascular;  Laterality: N/A;    Social History   Socioeconomic History   Marital status: Married    Spouse name: Not on file   Number of children: Not on file   Years of education: Not on file   Highest education level: Not on file  Occupational History   Occupation: retired  Tobacco Use   Smoking status: Some Days    Packs/day: 0.50    Years: 15.00    Pack years: 7.50    Types: Cigarettes    Last attempt to quit: 11/19/2020    Years since quitting: 0.6   Smokeless tobacco: Never  Vaping Use   Vaping Use: Never used  Substance and Sexual Activity   Alcohol use: Not Currently   Drug use: No   Sexual activity: Not on file  Other Topics Concern   Not on file  Social History Narrative   Not on file   Social Determinants of Health   Financial Resource Strain: Medium Risk   Difficulty of Paying Living Expenses: Somewhat hard  Food Insecurity: No Food Insecurity   Worried About Running Out of Food in the Last Year: Never true   [place] in  the Last Year: Never true  Transportation Needs: No Transportation Needs   Lack of Transportation (Medical): No   Lack of Transportation (Non-Medical): No  Physical Activity: Not on file  Stress: Not on file  Social Connections: Not on file  Intimate Partner Violence: Not on file    Allergies  Allergen Reactions   Metformin And Related Other (See Comments)    Kidney injury, GI upset, diarrhea    Family History  Problem Relation Age of Onset   Diabetes Mother    Hypertension Mother    Heart Problems Sister    Amblyopia Neg Hx    Blindness Neg Hx    Cataracts Neg Hx    Glaucoma Neg Hx    Macular degeneration Neg Hx    Retinal detachment Neg Hx    Strabismus Neg Hx    Retinitis pigmentosa Neg Hx      Prior to Admission medications   Medication Sig Start Date End Date Taking? Authorizing Provider  ACCU-CHEK GUIDE test strip See admin instructions. 01/12/21  Yes [provider]  Accu-Chek Softclix Lancets lancets See admin instructions. 01/12/21  Yes [provider]  aspirin EC 81 MG tablet Take 81 mg by mouth every Monday, Wednesday, and Friday.   Yes [provider]  atorvastatin (LIPITOR) 40 MG tablet Take 1 tablet (40 mg total) by mouth daily. 01/19/21  Yes [place], Maricela Bo, FNP  Blood Glucose Monitoring Suppl (ACCU-CHEK GUIDE ME) w/Device KIT See admin instructions. 01/12/21  Yes [provider]  Continuous Blood Gluc Sensor (FREESTYLE LIBRE 14 DAY SENSOR) MISC 1 each by Does not apply route every 14 (fourteen) days. 01/27/21  Yes Tysinger, Camelia Eng, PA-C  dapagliflozin propanediol (FARXIGA) 10 MG TABS tablet Take 1 tablet (10 mg total) by mouth daily before breakfast. 05/08/21  Yes Tysinger, Camelia Eng, PA-C  docusate sodium (COLACE) 100 MG capsule Take 100 mg by mouth daily as needed for mild constipation.   Yes [provider]  ferrous gluconate (FERGON) 324 MG tablet Take 1 tablet (324 mg total) by mouth 2 (two) times daily with a meal. 05/08/21  Yes Tysinger, Camelia Eng, PA-C  furosemide (LASIX) 40 MG tablet Take 0.5 tablets (20 mg total) by mouth daily. 05/17/21  Yes Bensimhon, Shaune Pascal, MD  hydrALAZINE (APRESOLINE) 50 MG tablet Take 1 tablet (50 mg total) by mouth every 8 (eight) hours. 05/17/21  Yes Bensimhon, Shaune Pascal, MD  hydroxychloroquine (PLAQUENIL) 200 MG tablet Take 1 tablet (200 mg total) by mouth daily. 05/08/21  Yes Rice, Resa Miner, MD  isosorbide mononitrate (IMDUR) 60 MG 24 hr tablet Take 1 tablet (60 mg total) by mouth daily. 03/06/21  Yes Bensimhon, Shaune Pascal, MD  linagliptin (TRADJENTA) 5 MG TABS tablet Take 1 tablet (5 mg total) by mouth daily. 05/08/21  Yes Tysinger, Camelia Eng, PA-C  metoprolol succinate (TOPROL-XL) 50 MG 24 hr tablet  TAKE 1 TABLET(50 MG) BY MOUTH DAILY WITH OR IMMEDIATELY FOLLOWING A MEAL Patient taking differently: Take 50 mg by mouth daily. 07/06/21  Yes Tysinger, Camelia Eng, PA-C  potassium chloride SA (KLOR-CON) 20 MEQ tablet Take 2 tablets (40 mEq total) by mouth 2 (two) times daily. 03/21/21  Yes Clegg, Amy D, NP  sacubitril-valsartan (ENTRESTO) 97-103 MG Take 1 tablet by mouth 2 (two) times daily. 05/17/21  Yes Bensimhon, Shaune Pascal, MD  spironolactone (ALDACTONE) 25 MG tablet Take 1 tablet (25 mg total) by mouth daily. 05/17/21  Yes Bensimhon, Shaune Pascal, MD  Vitamin D, Ergocalciferol, (DRISDOL) 1.25  MG (50000 UNIT) CAPS capsule Take 50,000 Units by mouth once a week. 12/09/20  Yes [provider]    Physical Exam: Vitals:   07/11/21 0530 07/11/21 0600 07/11/21 0730 07/11/21 0826  BP: (!) 145/69 (!) 172/76 (!) 183/97 (!) 182/77  Pulse: 62 72 78 70  Resp:   16 16  Temp:    97.7 F (36.5 C)  TempSrc:      SpO2: 97% 99% 100% 97%  Weight:      Height:         General:  Appears calm and comfortable and is in NAD Eyes:  PERRL, EOMI, normal lids, iris ENT:  grossly normal hearing, lips & tongue, mmm; poor dentition Neck:  no LAD, masses or thyromegaly Cardiovascular:  RRR, no m/r/g. No LE edema.  Respiratory:   CTA bilaterally with no wheezes/rales/rhonchi.  Normal respiratory effort. Abdomen:  soft, NT, ND Skin:  R elbow with deformity distally from RA (similar to L) but also with ulceration along the olecranon draining purulent yellow fluid, mild surrounding erythema and fluctuance     Musculoskeletal:  grossly normal tone BUE/BLE, good ROM; diffuse nodularities of the B arms and RA nodules and bony deformities of the hands   Psychiatric:  grossly normal mood and affect, speech fluent and appropriate, AOx3, mildly emotionally labile Neurologic:  CN 2-12 grossly intact, moves all extremities in coordinated fashion    Radiological Exams on Admission: Independently reviewed - see  discussion in A/P where applicable  DG Elbow Complete Right  Result Date: 07/10/2021 CLINICAL DATA:  Swelling, drainage, history of rheumatoid arthritis EXAM: RIGHT ELBOW - COMPLETE 3+ VIEW COMPARISON:  None. FINDINGS: There is no acute fracture or dislocation. Elbow alignment is maintained. There is soft tissue swelling along the dorsal aspect of the elbow with focal outpouching overlying the proximal radius. There is no definite soft tissue gas. There is no elbow effusion. IMPRESSION: 1. Soft tissue swelling over the dorsum of the elbow with focal outpouching over the proximal radius. Targeted ultrasound may be useful to assess for drainable fluid collection. 2. No acute osseous abnormality. Electronically Signed   By: Valetta Mole M.D.   On: 07/10/2021 19:01    EKG: Independently reviewed.  NSR with rate 78; prolonged QTc 501; incomplete LBBB; LVH; no evidence of acute ischemia   Labs on Admission: I have personally reviewed the available labs and imaging studies at the time of the admission.  Pertinent labs:   GFR 59 - stable WBC 8.3   Assessment/Plan Principal Problem:   Septic arthritis of elbow, right (HCC) Active Problems:   Diabetes (HCC)   Rheumatoid arthritis involving both hands (HCC)   Essential hypertension, benign   Hyperlipidemia associated with type 2 diabetes mellitus (HCC)   Tobacco use   Prolonged QT interval   Stage 3a chronic kidney disease (HCC)    Septic arthritis of the R elbow -Patient with severe RA, received a steroid injection in the R elbow on 8/15 and now with septic arthritis -WBC is often <15 with septic arthritis and is currently normal -Will admit to Med Surg. -Orthopedics is planning to take the patient to the OR for a wash out procedure today -Empiric coverage with Cefepime and Vanc given her immunocompromise -Does not appear to be systemically ill, currently no concern for sepsis  RA -She has fairly advanced disease based on clinical  appearance -She was last seen by Dr. Benjamine Mola on 8/15 and has seropositive RA and chronic tophaceous gout -She was  recently started on Plaquenil with possible mild improvement -She had the R olecranon bursa injected at her last visit -Will hold Plaquenil in the setting of active infection -Once source control is obtained, it is likely reasonable to resume Plaquenil, possibly in the next 1-2 days  Chronic combined CHF -Severe MR, EF 50-55% on 6/29 echo -Appears to be compensated at this time -Continue ASA, Imdur -Continue Aldactone but hold Lasix -Continue Entresto  DM -6/16 A1c was 7.0 -hold Tradjenta -Cover with moderate-scale SSI   HTN -Continue hydralazine, Toprol XL, Entresto  HLD -Continue Lipitor  Prolonged QTc -Likely associated with mild dehydration, infection -Will attempt to avoid QT-prolonging medications such as PPI, nausea meds, SSRIs -Repeat EKG in AM   Stage 3a CKD -Appears to be chronic/stable -Hold Farxiga for now -Recheck BMP in AM  Tobacco abuse -No longer smoking regularly but continues to smoke periodically -Ongoing total cessation should be encouraged     Note: This patient has been tested and is negative for the novel coronavirus COVID-19. The patient has been fully vaccinated against COVID-19.   Level of care: Med-Surg DVT prophylaxis: SCDs Code Status:  Full - confirmed with patient Family Communication: None present Disposition Plan:  The patient is from: home  Anticipated d/c is to: home without [facility] services  Anticipated d/c date will depend on clinical response to treatment, likely 2-3 days  Patient is currently: acutely ill Consults called: Orthopedics  Admission status:  Admit - It is my clinical opinion that admission to [facility] is reasonable and necessary because of the expectation that this patient will require hospital care that crosses at least 2 midnights to treat this condition based on the medical complexity of the problems  presented.  Given the aforementioned information, the predictability of an adverse outcome is felt to be significant.    Karmen Bongo MD [facility]   How to contact the [facility] Attending or Consulting provider [place] or covering provider during after hours [place], for this patient?  Check the care team in [facility] and look for a) attending/consulting [facility] provider listed and b) the [facility] team listed Log into [email or web address] and use 's universal password to access. If you do not have the password, please contact the hospital operator. Locate the [facility] provider you are looking for under [facility] and page to a number that you can be directly reached. If you still have difficulty reaching the provider, please page the [employer] (Director on Call) for the Hospitalists listed on amion for assistance.   07/11/2021, 9:50 AM

## 2021-07-11 NOTE — Anesthesia Preprocedure Evaluation (Signed)
Anesthesia Evaluation  Patient identified by MRN, date of birth, ID band Patient awake    Reviewed: Allergy & Precautions, NPO status , Patient's Chart, lab work & pertinent test results  History of Anesthesia Complications Negative for: history of anesthetic complications  Airway Mallampati: II  TM Distance: >3 FB Neck ROM: Full    Dental  (+) Dental Advisory Given, Poor Dentition, Chipped, Missing,    Pulmonary neg shortness of breath, neg sleep apnea, neg COPD, neg recent URI, Current Smoker and Patient abstained from smoking.,    breath sounds clear to auscultation       Cardiovascular hypertension, Pt. on medications (-) angina+ CAD and +CHF  + Valvular Problems/Murmurs  Rhythm:Regular + Systolic murmurs 4627 tte:  1. 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 There is incomplete  leaflet coaptation and severe mitral regurgitation. BP during the study  noted to be 190/80 which can worsen regurgitation severity, or at least  the apperance on color doppler. The  mean gradient across the valve is 5 mmHG @ 63 bpm. This is elevated due  to severe MR, suspect no significant mitral stenosis. Would recommend to  either repeat a TTE with better BP control, or to pursue TEE. Of note, the  LV function has improved and the  MR has worsened which would also suggest that the MR is worse due to  severely elevated BP and not the etiology of the cardiomyopathy. Clinical  correlation is recommended. The mitral valve is rheumatic. Severe mitral  valve regurgitation. No evidence of  mitral stenosis.  2. Left ventricular ejection fraction, by estimation, is 50 to 55%. The  left ventricle has low normal function. The left ventricle has no regional  wall motion abnormalities. There is moderate concentric left ventricular  hypertrophy. Left ventricular  diastolic function could not be evaluated. The average left ventricular  global  longitudinal strain is -13.8 %. The global longitudinal strain is  abnormal.  3. Right ventricular systolic function is normal. The right ventricular  size is normal. There is normal pulmonary artery systolic pressure. The  estimated right ventricular systolic pressure is 03.5 mmHg.  4. Left atrial size was mild to moderately dilated.  5. The aortic valve is tricuspid. There is mild calcification of the  aortic valve. Aortic valve regurgitation is mild.  6. The inferior vena cava is normal in size with greater than 50%  respiratory variability, suggesting right atrial pressure of 3 mmHg.    1. 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 80% lesion in mLCX 3. Mild non-obstructive CAD in LAD 4. EF 40% with elevated filling pressures and normal CO    Neuro/Psych negative neurological ROS  negative psych ROS   GI/Hepatic   Endo/Other  diabetesLab Results      Component                Value               Date                      HGBA1C                   7.0 (H)             05/04/2021             Renal/GU Renal diseaseLab Results      Component  Value               Date                      CREATININE               1.05 (H)            07/10/2021           Lab Results      Component                Value               Date                      K                        3.8                 07/10/2021                Musculoskeletal  (+) Arthritis , Rheumatoid disorders,    Abdominal   Peds  Hematology negative hematology ROS (+) Lab Results      Component                Value               Date                      WBC                      8.3                 07/10/2021                HGB                      15.0                07/10/2021                HCT                      48.0 (H)            07/10/2021                MCV                      89.2                07/10/2021                PLT                      245                  07/10/2021              Anesthesia Other Findings   Reproductive/Obstetrics                             Anesthesia Physical Anesthesia Plan  ASA: 3  Anesthesia Plan: General   Post-op Pain Management:    Induction: Intravenous  PONV Risk Score  and Plan: 2 and Ondansetron and Dexamethasone  Airway Management Planned: LMA and Oral ETT  Additional Equipment: None  Intra-op Plan:   Post-operative Plan: Extubation in OR  Informed Consent: I have reviewed the patients History and Physical, chart, labs and discussed the procedure including the risks, benefits and alternatives for the proposed anesthesia with the patient or authorized representative who has indicated his/her understanding and acceptance.     Dental advisory given  Plan Discussed with: CRNA and Anesthesiologist  Anesthesia Plan Comments:         Anesthesia Quick Evaluation

## 2021-07-11 NOTE — ED Provider Notes (Signed)
[facility] DEPT Provider Note   CSN: [id number] Arrival date & time: 07/10/21  1725     History Chief Complaint  Patient presents with   elbow wound    Maria Foster is a 65 y.o. female.  The history is provided by the patient.  Extremity Pain This is a new problem. The current episode started more than 1 week ago. The problem occurs constantly. The problem has been gradually worsening. Pertinent negatives include no chest pain, no abdominal pain, no headaches and no shortness of breath. Nothing aggravates the symptoms. Nothing relieves the symptoms. She has tried nothing for the symptoms. The treatment provided no relief.  Patient with rheumatoid arthritis, DM and CHF reportedly on Humira presents with 8 days of R elbow pain.  States pain started and last Monday her rheumatologist injected steroids in the elbow and then on Sunday pain worsened and then began draining purulent material     Past Medical History:  Diagnosis Date   Arthritis    Cataract    Diabetes mellitus type 2 with complications ([place])    diagnosed probably 2012 per patient   Diabetic retinopathy ([place])    Hypertension     Patient Active Problem List   Diagnosis Date Noted   Essential hypertension, benign 05/04/2021   Hyperlipidemia associated with type 2 diabetes mellitus ([place]) 05/04/2021   Enlarged and hypertrophic nails 05/04/2021   Encounter for screening mammogram for malignant neoplasm of breast 05/04/2021   Need for pneumococcal vaccination 05/04/2021   Vaginal itching 05/04/2021   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/04/2021   Stable angina ([place]) 05/04/2021   Vaccine counseling 05/04/2021   Tobacco use 05/04/2021   Weight loss 05/04/2021   High risk medication use 03/29/2021   Iron deficiency anemia 01/27/2021   Screen for colon cancer 01/27/2021   Yeast vaginitis 01/11/2021   Diarrhea 01/11/2021   Former smoker 01/11/2021    Rhinorrhea 01/11/2021   Tophi 01/11/2021   Rheumatoid arthritis involving both hands ([place]) 01/11/2021   Elevated troponin    Acute combined systolic and diastolic heart failure ([place])    Diabetes ([place]) 12/19/2020   Constipation 12/19/2020   AKI (acute kidney injury) ([place]) 12/19/2020    Past Surgical History:  Procedure Laterality Date   PRP Bilateral OD - 07/23/2017 OS - 08/06/2017   RIGHT/LEFT HEART CATH AND CORONARY ANGIOGRAPHY N/A 01/05/2021   Procedure: RIGHT/LEFT HEART CATH AND CORONARY ANGIOGRAPHY;  Surgeon: Jolaine Artist, MD;  Location: [facility];  Service: Cardiovascular;  Laterality: N/A;     OB History   No obstetric history on file.     Family History  Problem Relation Age of Onset   Diabetes Mother    Hypertension Mother    Heart Problems Sister    Amblyopia Neg Hx    Blindness Neg Hx    Cataracts Neg Hx    Glaucoma Neg Hx    Macular degeneration Neg Hx    Retinal detachment Neg Hx    Strabismus Neg Hx    Retinitis pigmentosa Neg Hx     Social History   Tobacco Use   Smoking status: Some Days    Types: Cigarettes    Last attempt to quit: 11/19/2020    Years since quitting: 0.6   Smokeless tobacco: Never  Vaping Use   Vaping Use: Never used  Substance Use Topics   Alcohol use: Not Currently   Drug use: No    Home Medications  Prior to Admission medications   Medication Sig Start Date End Date Taking? Authorizing Provider  ACCU-CHEK GUIDE test strip See admin instructions. 01/12/21  Yes [provider]  Accu-Chek Softclix Lancets lancets See admin instructions. 01/12/21  Yes [provider]  aspirin EC 81 MG tablet Take 81 mg by mouth every Monday, Wednesday, and Friday.   Yes [provider]  atorvastatin (LIPITOR) 40 MG tablet Take 1 tablet (40 mg total) by mouth daily. 01/19/21  Yes [place], Maricela Bo, FNP  Blood Glucose Monitoring Suppl (ACCU-CHEK GUIDE ME) w/Device KIT See admin instructions. 01/12/21  Yes  [provider]  Continuous Blood Gluc Sensor (FREESTYLE LIBRE 14 DAY SENSOR) MISC 1 each by Does not apply route every 14 (fourteen) days. 01/27/21  Yes Tysinger, Camelia Eng, PA-C  dapagliflozin propanediol (FARXIGA) 10 MG TABS tablet Take 1 tablet (10 mg total) by mouth daily before breakfast. 05/08/21  Yes Tysinger, Camelia Eng, PA-C  docusate sodium (COLACE) 100 MG capsule Take 100 mg by mouth daily as needed for mild constipation.   Yes [provider]  ferrous gluconate (FERGON) 324 MG tablet Take 1 tablet (324 mg total) by mouth 2 (two) times daily with a meal. 05/08/21  Yes Tysinger, Camelia Eng, PA-C  furosemide (LASIX) 40 MG tablet Take 0.5 tablets (20 mg total) by mouth daily. 05/17/21  Yes Bensimhon, Shaune Pascal, MD  hydrALAZINE (APRESOLINE) 50 MG tablet Take 1 tablet (50 mg total) by mouth every 8 (eight) hours. 05/17/21  Yes Bensimhon, Shaune Pascal, MD  hydroxychloroquine (PLAQUENIL) 200 MG tablet Take 1 tablet (200 mg total) by mouth daily. 05/08/21  Yes Rice, Resa Miner, MD  isosorbide mononitrate (IMDUR) 60 MG 24 hr tablet Take 1 tablet (60 mg total) by mouth daily. 03/06/21  Yes Bensimhon, Shaune Pascal, MD  linagliptin (TRADJENTA) 5 MG TABS tablet Take 1 tablet (5 mg total) by mouth daily. 05/08/21  Yes Tysinger, Camelia Eng, PA-C  metoprolol succinate (TOPROL-XL) 50 MG 24 hr tablet TAKE 1 TABLET(50 MG) BY MOUTH DAILY WITH OR IMMEDIATELY FOLLOWING A MEAL Patient taking differently: Take 50 mg by mouth daily. 07/06/21  Yes Tysinger, Camelia Eng, PA-C  potassium chloride SA (KLOR-CON) 20 MEQ tablet Take 2 tablets (40 mEq total) by mouth 2 (two) times daily. 03/21/21  Yes Clegg, Amy D, NP  sacubitril-valsartan (ENTRESTO) 97-103 MG Take 1 tablet by mouth 2 (two) times daily. 05/17/21  Yes Bensimhon, Shaune Pascal, MD  spironolactone (ALDACTONE) 25 MG tablet Take 1 tablet (25 mg total) by mouth daily. 05/17/21  Yes Bensimhon, Shaune Pascal, MD  Vitamin D, Ergocalciferol, (DRISDOL) 1.25 MG (50000 UNIT) CAPS capsule Take  50,000 Units by mouth once a week. 12/09/20  Yes [provider]    Allergies    Metformin and related  Review of Systems   Review of Systems  Constitutional:  Negative for fever.  HENT:  Negative for facial swelling.   Eyes:  Negative for redness.  Respiratory:  Negative for shortness of breath.   Cardiovascular:  Negative for chest pain.  Gastrointestinal:  Negative for abdominal pain.  Genitourinary:  Negative for difficulty urinating.  Musculoskeletal:  Positive for arthralgias.  Skin:  Positive for wound.  Neurological:  Negative for headaches.  Psychiatric/Behavioral:  Negative for agitation.   All other systems reviewed and are negative.  Physical Exam Updated Vital Signs BP (!) 202/104   Pulse 62   Temp 98.1 F (36.7 C) (Oral)   Resp 16   Ht $R'5\' 3"'gu$  (1.6 m)  Wt 59.9 kg   SpO2 100%   BMI 23.38 kg/m   Physical Exam Vitals and nursing note reviewed.  Constitutional:      General: She is not in acute distress.    Appearance: Normal appearance.  HENT:     Head: Normocephalic and atraumatic.     Nose: Nose normal.  Eyes:     Conjunctiva/sclera: Conjunctivae normal.     Pupils: Pupils are equal, round, and reactive to light.  Cardiovascular:     Rate and Rhythm: Normal rate and regular rhythm.     Pulses: Normal pulses.     Heart sounds: Normal heart sounds.  Pulmonary:     Effort: Pulmonary effort is normal.     Breath sounds: Normal breath sounds.  Abdominal:     General: Abdomen is flat. Bowel sounds are normal.     Palpations: Abdomen is soft.     Tenderness: There is no abdominal tenderness. There is no guarding.  Musculoskeletal:        General: Tenderness present.       Arms:     Cervical back: Normal range of motion and neck supple.  Skin:    Capillary Refill: Capillary refill takes less than 2 seconds.     Findings: Erythema present.  Neurological:     Mental Status: She is alert.     Deep Tendon Reflexes: Reflexes normal.   Psychiatric:        Mood and Affect: Mood normal.        Behavior: Behavior normal.    ED Results / Procedures / Treatments   Labs (all labs ordered are listed, but only abnormal results are displayed) Results for orders placed or performed during the hospital encounter of 16/10/96  Basic metabolic panel  Result Value Ref Range   Sodium 141 135 - 145 mmol/L   Potassium 3.8 3.5 - 5.1 mmol/L   Chloride 108 98 - 111 mmol/L   CO2 24 22 - 32 mmol/L   Glucose, Bld 103 (H) 70 - 99 mg/dL   BUN 23 8 - 23 mg/dL   Creatinine, Ser 1.05 (H) 0.44 - 1.00 mg/dL   Calcium 9.2 8.9 - 10.3 mg/dL   GFR, Estimated 59 (L) >60 mL/min   Anion gap 9 5 - 15  CBC with Differential  Result Value Ref Range   WBC 8.3 4.0 - 10.5 K/uL   RBC 5.38 (H) 3.87 - 5.11 MIL/uL   Hemoglobin 15.0 12.0 - 15.0 g/dL   HCT 48.0 (H) 36.0 - 46.0 %   MCV 89.2 80.0 - 100.0 fL   MCH 27.9 26.0 - 34.0 pg   MCHC 31.3 30.0 - 36.0 g/dL   RDW 16.0 (H) 11.5 - 15.5 %   Platelets 245 150 - 400 K/uL   nRBC 0.0 0.0 - 0.2 %   Neutrophils Relative % 78 %   Neutro Abs 6.4 1.7 - 7.7 K/uL   Lymphocytes Relative 14 %   Lymphs Abs 1.2 0.7 - 4.0 K/uL   Monocytes Relative 6 %   Monocytes Absolute 0.5 0.1 - 1.0 K/uL   Eosinophils Relative 1 %   Eosinophils Absolute 0.1 0.0 - 0.5 K/uL   Basophils Relative 1 %   Basophils Absolute 0.1 0.0 - 0.1 K/uL   Immature Granulocytes 0 %   Abs Immature Granulocytes 0.02 0.00 - 0.07 K/uL   DG Elbow Complete Right  Result Date: 07/10/2021 CLINICAL DATA:  Swelling, drainage, history of rheumatoid arthritis EXAM: RIGHT ELBOW - COMPLETE  3+ VIEW COMPARISON:  None. FINDINGS: There is no acute fracture or dislocation. Elbow alignment is maintained. There is soft tissue swelling along the dorsal aspect of the elbow with focal outpouching overlying the proximal radius. There is no definite soft tissue gas. There is no elbow effusion. IMPRESSION: 1. Soft tissue swelling over the dorsum of the elbow with focal  outpouching over the proximal radius. Targeted ultrasound may be useful to assess for drainable fluid collection. 2. No acute osseous abnormality. Electronically Signed   By: Valetta Mole M.D.   On: 07/10/2021 19:01    Radiology DG Elbow Complete Right  Result Date: 07/10/2021 CLINICAL DATA:  Swelling, drainage, history of rheumatoid arthritis EXAM: RIGHT ELBOW - COMPLETE 3+ VIEW COMPARISON:  None. FINDINGS: There is no acute fracture or dislocation. Elbow alignment is maintained. There is soft tissue swelling along the dorsal aspect of the elbow with focal outpouching overlying the proximal radius. There is no definite soft tissue gas. There is no elbow effusion. IMPRESSION: 1. Soft tissue swelling over the dorsum of the elbow with focal outpouching over the proximal radius. Targeted ultrasound may be useful to assess for drainable fluid collection. 2. No acute osseous abnormality. Electronically Signed   By: Valetta Mole M.D.   On: 07/10/2021 19:01    Procedures Procedures   Medications Ordered in ED Medications - No data to display  ED Course  I have reviewed the triage vital signs and the nursing notes.  Pertinent labs & imaging results that were available during my care of the patient were reviewed by me and considered in my medical decision making (see chart for details).   1258 case d/w Dr. Alvan Dame, keep NPO and admit for IV antiobiotics and wash out   Final Clinical Impression(s) / ED Diagnoses Final diagnoses:  None   Admit to medicine  Rx / DC Orders ED Discharge Orders     None        Ionna Avis, MD 07/11/21 4259

## 2021-07-11 NOTE — Consult Note (Signed)
Reason for Consult: Septic olecranon bursitis Referring Physician: Loney Loh, MD  Maria Foster is an 65 y.o. female.  HPI: This is a new problem. The current episode started more than 1 week ago. The problem occurs constantly. The problem has been gradually worsening. Pertinent negatives include no chest pain, no abdominal pain, no headaches and no shortness of breath. Nothing aggravates the symptoms. Nothing relieves the symptoms. She has tried nothing for the symptoms. The treatment provided no relief.  Patient with rheumatoid arthritis, DM and CHF reportedly on Humira presents with 8 days of R elbow pain.  States pain started and last Monday her rheumatologist injected steroids in the elbow and then on Sunday pain worsened and then began draining purulent material   Past Medical History:  Diagnosis Date   Arthritis    Cataract    Diabetes mellitus type 2 with complications ([facility])    diagnosed probably 2012 per patient   Diabetic retinopathy ([facility])    Hypertension    Rheumatoid arthritis ([facility])     Past Surgical History:  Procedure Laterality Date   PRP Bilateral OD - 07/23/2017 OS - 08/06/2017   RIGHT/LEFT HEART CATH AND CORONARY ANGIOGRAPHY N/A 01/05/2021   Procedure: RIGHT/LEFT HEART CATH AND CORONARY ANGIOGRAPHY;  Surgeon: Dolores Patty, MD;  Location: [facility];  Service: Cardiovascular;  Laterality: N/A;    Family History  Problem Relation Age of Onset   Diabetes Mother    Hypertension Mother    Heart Problems Sister    Amblyopia Neg Hx    Blindness Neg Hx    Cataracts Neg Hx    Glaucoma Neg Hx    Macular degeneration Neg Hx    Retinal detachment Neg Hx    Strabismus Neg Hx    Retinitis pigmentosa Neg Hx     Social History:  reports that she has been smoking cigarettes. She has a 7.50 pack-year smoking history. She has never used smokeless tobacco. She reports that she does not currently use alcohol. She reports that she does not use drugs.  Allergies:   Allergies  Allergen Reactions   Metformin And Related Other (See Comments)    Kidney injury, GI upset, diarrhea    Medications: I have reviewed the patient's current medications. Scheduled:  Results for orders placed or performed during the hospital encounter of 07/10/21 (from the past 24 hour(s))  Basic metabolic panel     Status: Abnormal   Collection Time: 07/10/21 11:54 PM  Result Value Ref Range   Sodium 141 135 - 145 mmol/L   Potassium 3.8 3.5 - 5.1 mmol/L   Chloride 108 98 - 111 mmol/L   CO2 24 22 - 32 mmol/L   Glucose, Bld 103 (H) 70 - 99 mg/dL   BUN 23 8 - 23 mg/dL   Creatinine, Ser 1.09 (H) 0.44 - 1.00 mg/dL   Calcium 9.2 8.9 - 32.3 mg/dL   GFR, Estimated 59 (L) >60 mL/min   Anion gap 9 5 - 15  CBC with Differential     Status: Abnormal   Collection Time: 07/10/21 11:54 PM  Result Value Ref Range   WBC 8.3 4.0 - 10.5 K/uL   RBC 5.38 (H) 3.87 - 5.11 MIL/uL   Hemoglobin 15.0 12.0 - 15.0 g/dL   HCT 55.7 (H) 32.2 - 02.5 %   MCV 89.2 80.0 - 100.0 fL   MCH 27.9 26.0 - 34.0 pg   MCHC 31.3 30.0 - 36.0 g/dL   RDW 42.7 (H) 06.2 -  15.5 %   Platelets 245 150 - 400 K/uL   nRBC 0.0 0.0 - 0.2 %   Neutrophils Relative % 78 %   Neutro Abs 6.4 1.7 - 7.7 K/uL   Lymphocytes Relative 14 %   Lymphs Abs 1.2 0.7 - 4.0 K/uL   Monocytes Relative 6 %   Monocytes Absolute 0.5 0.1 - 1.0 K/uL   Eosinophils Relative 1 %   Eosinophils Absolute 0.1 0.0 - 0.5 K/uL   Basophils Relative 1 %   Basophils Absolute 0.1 0.0 - 0.1 K/uL   Immature Granulocytes 0 %   Abs Immature Granulocytes 0.02 0.00 - 0.07 K/uL  Resp Panel by RT-PCR (Flu A&B, Covid) Nasopharyngeal Swab     Status: None   Collection Time: 07/11/21  7:54 AM   Specimen: Nasopharyngeal Swab; Nasopharyngeal(NP) swabs in vial transport medium  Result Value Ref Range   SARS Coronavirus 2 by RT PCR NEGATIVE NEGATIVE   Influenza A by PCR NEGATIVE NEGATIVE   Influenza B by PCR NEGATIVE NEGATIVE     X-ray: CLINICAL DATA:   Swelling, drainage, history of rheumatoid arthritis   EXAM: RIGHT ELBOW - COMPLETE 3+ VIEW   COMPARISON:  None.   FINDINGS: There is no acute fracture or dislocation. Elbow alignment is maintained.   There is soft tissue swelling along the dorsal aspect of the elbow with focal outpouching overlying the proximal radius. There is no definite soft tissue gas. There is no elbow effusion.   IMPRESSION: 1. Soft tissue swelling over the dorsum of the elbow with focal outpouching over the proximal radius. Targeted ultrasound may be useful to assess for drainable fluid collection. 2. No acute osseous abnormality.     Electronically Signed   By: Lesia Hausen M.D.  ROS As per HPI  Blood pressure (!) 182/77, pulse 70, temperature 97.7 F (36.5 C), resp. rate 16, height 5\' 3"  (1.6 m), weight 59.9 kg, SpO2 97 %.  Physical Exam: Vitals and nursing note reviewed.  Constitutional:      General: She is not in acute distress.    Appearance: Normal appearance.  HENT:     Head: Normocephalic and atraumatic.     Nose: Nose normal.  Eyes:     Conjunctiva/sclera: Conjunctivae normal.     Pupils: Pupils are equal, round, and reactive to light.  Cardiovascular:     Rate and Rhythm: Normal rate and regular rhythm.     Pulses: Normal pulses.     Heart sounds: Normal heart sounds.  Pulmonary:     Effort: Pulmonary effort is normal.     Breath sounds: Normal breath sounds.  Abdominal:     General: Abdomen is flat. Bowel sounds are normal.     Palpations: Abdomen is soft.     Tenderness: There is no abdominal tenderness. There is no guarding.  Musculoskeletal:        General: Tenderness present.       Arms:    Cervical back: Normal range of motion and neck supple.  Skin:    Capillary Refill: Capillary refill takes less than 2 seconds.     Findings: Erythema present.  Neurological:     Mental Status: She is alert.     Deep Tendon Reflexes: Reflexes normal.  Psychiatric:        Mood  and Affect: Mood normal.        Behavior: Behavior normal.   Assessment/Plan: Right septic olecranon bursitis  Plan: Needs to have her elbow washed out I will  examine elbow in pre-op due to performing elective surgery  NPO Will take cultures in OR Started on empiric antibiotics  [name] 07/11/2021, 9:08 AM

## 2021-07-12 ENCOUNTER — Encounter (HOSPITAL_COMMUNITY): Payer: Self-pay | Admitting: Orthopedic Surgery

## 2021-07-12 DIAGNOSIS — E0801 Diabetes mellitus due to underlying condition with hyperosmolarity with coma: Secondary | ICD-10-CM

## 2021-07-12 DIAGNOSIS — M71121 Other infective bursitis, right elbow: Secondary | ICD-10-CM

## 2021-07-12 DIAGNOSIS — Z794 Long term (current) use of insulin: Secondary | ICD-10-CM

## 2021-07-12 LAB — CBC
HCT: 38.4 % (ref 36.0–46.0)
Hemoglobin: 12 g/dL (ref 12.0–15.0)
MCH: 28 pg (ref 26.0–34.0)
MCHC: 31.3 g/dL (ref 30.0–36.0)
MCV: 89.5 fL (ref 80.0–100.0)
Platelets: 192 10*3/uL (ref 150–400)
RBC: 4.29 MIL/uL (ref 3.87–5.11)
RDW: 15.9 % — ABNORMAL HIGH (ref 11.5–15.5)
WBC: 8.2 10*3/uL (ref 4.0–10.5)
nRBC: 0 % (ref 0.0–0.2)

## 2021-07-12 LAB — BASIC METABOLIC PANEL
Anion gap: 6 (ref 5–15)
BUN: 28 mg/dL — ABNORMAL HIGH (ref 8–23)
CO2: 22 mmol/L (ref 22–32)
Calcium: 8.3 mg/dL — ABNORMAL LOW (ref 8.9–10.3)
Chloride: 107 mmol/L (ref 98–111)
Creatinine, Ser: 1.15 mg/dL — ABNORMAL HIGH (ref 0.44–1.00)
GFR, Estimated: 53 mL/min — ABNORMAL LOW (ref >60)
Glucose, Bld: 208 mg/dL — ABNORMAL HIGH (ref 70–99)
Potassium: 4 mmol/L (ref 3.5–5.1)
Sodium: 135 mmol/L (ref 135–145)

## 2021-07-12 LAB — GLUCOSE, CAPILLARY
Glucose-Capillary: 176 mg/dL — ABNORMAL HIGH (ref 70–99)
Glucose-Capillary: 122 mg/dL — ABNORMAL HIGH (ref 70–99)
Glucose-Capillary: 215 mg/dL — ABNORMAL HIGH (ref 70–99)
Glucose-Capillary: 284 mg/dL — ABNORMAL HIGH (ref 70–99)

## 2021-07-12 MED ORDER — SODIUM CHLORIDE 0.9 % IV SOLN: INTRAVENOUS | Status: AC

## 2021-07-12 NOTE — Progress Notes (Signed)
   Subjective: 1 Day Post-Op Procedure(s) (LRB): INCISION AND DRAINAGE RIGHT ELBOW (N/A) Patient reports pain as mild.   Patient seen in rounds for Dr. [name]. Patient is well, and has had no acute complaints or problems. No acute events overnight. Voiding without difficulty.    Objective: Vital signs in last 24 hours: Temp:  [97.4 F (36.3 C)-98.4 F (36.9 C)] 97.6 F (36.4 C) (08/24 0803) Pulse Rate:  [60-90] 60 (08/24 0803) Resp:  [14-18] 16 (08/24 0803) BP: (130-200)/(65-98) 167/69 (08/24 0803) SpO2:  [95 %-100 %] 100 % (08/24 0803)  Intake/Output from previous day:  Intake/Output Summary (Last 24 hours) at 07/12/2021 0853 Last data filed at 07/12/2021 0600 Gross per 24 hour  Intake 2166.83 ml  Output 320 ml  Net 1846.83 ml     Intake/Output this shift: No intake/output data recorded.  Labs: Recent Labs    07/10/21 2354 07/12/21 0458  HGB 15.0 12.0   Recent Labs    07/10/21 2354 07/12/21 0458  WBC 8.3 8.2  RBC 5.38* 4.29  HCT 48.0* 38.4  PLT 245 192   Recent Labs    07/10/21 2354 07/12/21 0458  NA 141 135  K 3.8 4.0  CL 108 107  CO2 24 22  BUN 23 28*  CREATININE 1.05* 1.15*  GLUCOSE 103* 208*  CALCIUM 9.2 8.3*   No results for input(s): LABPT, INR in the last 72 hours.  Exam: General - Patient is Alert and Oriented Extremity - Right arm posterior splint in place Neurologically intact Sensation intact distally Intact pulses distally  Dressing - dressing C/D/I Motor Function - intact, moving fingers  Past Medical History:  Diagnosis Date   Arthritis    Cataract    Diabetes mellitus type 2 with complications ([facility])    diagnosed probably 2012 per patient   Diabetic retinopathy ([facility])    Hypertension    Rheumatoid arthritis ([facility])     Assessment/Plan: 1 Day Post-Op Procedure(s) (LRB): INCISION AND DRAINAGE RIGHT ELBOW (N/A) Principal Problem:   Septic arthritis of elbow, right ([facility]) Active Problems:   Diabetes ([facility])   Rheumatoid  arthritis involving both hands ([facility])   Essential hypertension, benign   Hyperlipidemia associated with type 2 diabetes mellitus ([facility])   Tobacco use   Prolonged QT interval   Stage 3a chronic kidney disease ([facility])   Septic olecranon bursitis of right elbow  Estimated body mass index is 23.38 kg/m as calculated from the following:   Height as of this encounter: 5\' 3"  (1.6 m).   Weight as of this encounter: 59.9 kg. Advance diet Up with therapy   DVT Prophylaxis - Aspirin NWB RUE  Pain seems to be under control currently. Will discuss with our hand team regarding recommendations for home abx regimen. Plan to pull penrose drain tomorrow.   , PA-C Orthopedic Surgery [phone] 07/12/2021, 8:53 AM

## 2021-07-12 NOTE — Plan of Care (Signed)

## 2021-07-12 NOTE — TOC Initial Note (Signed)
Transition of Care [facility]) - Initial/Assessment Note    Patient Details  Name: Maria Foster MRN: [id number] Date of Birth: 27-Aug-1956  [facility]) CM/SW Contact:    Lanier Clam, RN Phone Number: 07/12/2021, 10:45 AM  Clinical Narrative:  Referral for HH/SNF/meds-PT cons placed. Noted ortho  following-on iv abx-will follow if long term iv abx needed.                 Expected Discharge Plan: Home/Self Care Barriers to Discharge: Continued Medical Work up   Patient Goals and CMS Choice Patient states their goals for this hospitalization and ongoing recovery are:: go home CMS Medicare.gov Compare Post Acute Care list provided to:: Patient Choice offered to / list presented to : Patient  Expected Discharge Plan and Services Expected Discharge Plan: Home/Self Care   Discharge Planning Services: CM Consult   Living arrangements for the past 2 months: Single Family Home                                      Prior Living Arrangements/Services Living arrangements for the past 2 months: Single Family Home Lives with:: Spouse Patient language and need for interpreter reviewed:: Yes Do you feel safe going back to the place where you live?: Yes      Need for Family Participation in Patient Care: No (Comment)     Criminal Activity/Legal Involvement Pertinent to Current Situation/Hospitalization: No - Comment as needed  Activities of Daily Living Home Assistive Devices/Equipment: CPAP, Other (Comment) (free style libre) ADL Screening (condition at time of admission) Patient's cognitive ability adequate to safely complete daily activities?: Yes Is the patient deaf or have difficulty hearing?: No Does the patient have difficulty seeing, even when wearing glasses/contacts?: Yes (macular degeneration) Does the patient have difficulty concentrating, remembering, or making decisions?: No Patient able to express need for assistance with ADLs?: Yes Does the patient  have difficulty dressing or bathing?: Yes Independently performs ADLs?: No Communication: Independent Dressing (OT): Needs assistance Is this a change from baseline?: Pre-admission baseline Grooming: Independent Feeding: Needs assistance Is this a change from baseline?: Pre-admission baseline Bathing: Needs assistance Is this a change from baseline?: Pre-admission baseline Toileting: Needs assistance Is this a change from baseline?: Pre-admission baseline In/Out Bed: Needs assistance Is this a change from baseline?: Pre-admission baseline Walks in Home: Needs assistance Is this a change from baseline?: Pre-admission baseline Does the patient have difficulty walking or climbing stairs?: Yes (secondary to bilateral knee pain and rheumatoid arthritis) Weakness of Legs: Both Weakness of Arms/Hands: Both  Permission Sought/Granted Permission sought to share information with : Case Manager Permission granted to share information with : Yes, Verbal Permission Granted  Share Information with NAME: Case manager     Permission granted to share info w Relationship: Fayrene Fearing spouse [phone]     Emotional Assessment Appearance:: Appears stated age Attitude/Demeanor/Rapport: Gracious Affect (typically observed): Accepting Orientation: : Oriented to Self, Oriented to Place, Oriented to  Time Alcohol / Substance Use: Not Applicable Psych Involvement: No (comment)  Admission diagnosis:  Infected elbow (HCC) [M00.9] Olecranon bursitis of right elbow [M70.21] Cellulitis, unspecified cellulitis site [L03.90] Septic arthritis of elbow, right (HCC) [M00.9] Patient Active Problem List   Diagnosis Date Noted   Septic arthritis of elbow, right (HCC) 07/11/2021   Prolonged QT interval 07/11/2021   Stage 3a chronic kidney disease (HCC) 07/11/2021   Septic olecranon bursitis  of right elbow 07/11/2021   Essential hypertension, benign 05/04/2021   Hyperlipidemia associated with type 2 diabetes  mellitus (HCC) 05/04/2021   Enlarged and hypertrophic nails 05/04/2021   Encounter for screening mammogram for malignant neoplasm of breast 05/04/2021   Need for pneumococcal vaccination 05/04/2021   Vaginal itching 05/04/2021   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HCC) 05/04/2021   Stable angina (HCC) 05/04/2021   Vaccine counseling 05/04/2021   Tobacco use 05/04/2021   Weight loss 05/04/2021   High risk medication use 03/29/2021   Iron deficiency anemia 01/27/2021   Screen for colon cancer 01/27/2021   Yeast vaginitis 01/11/2021   Diarrhea 01/11/2021   Former smoker 01/11/2021   Rhinorrhea 01/11/2021   Tophi 01/11/2021   Rheumatoid arthritis involving both hands (HCC) 01/11/2021   Elevated troponin    Acute combined systolic and diastolic heart failure (HCC)    Diabetes (HCC) 12/19/2020   Constipation 12/19/2020   AKI (acute kidney injury) (HCC) 12/19/2020   PCP:  Jac Canavan, PA-C Pharmacy:   [id number] - [place], [place] - [address] AT [facility] OF [address] [place] [id number] Phone: [phone] Fax: [phone]     Social Determinants of Health (SDOH) Interventions    Readmission Risk Interventions No flowsheet data found.

## 2021-07-12 NOTE — Progress Notes (Signed)
PROGRESS NOTE    Maria Foster  OEH:[id number] DOB: 1956/09/22 DOA: 07/10/2021 PCP: Carlena Hurl, PA-C  Brief Narrative:  The patient is a 65 year old African-American female with a past medical history significant for but not limited to diabetes mellitus type 2, hypertension, rheumatoid arthritis on Plaquenil, history of chronic mild CHF, CKD stage IIIa, as well as other comorbidities who presented with a right elbow infection following a steroid injection a week ago.  She saw Dr. Benjamine Mola last Monday and gave her steroid shot in her arm because it is really sore.  On Sunday a.m. she woke up in a little knot on her arm that burst and there is drainage.  She sent a picture to Dr. Benjamine Mola and encouraged to come to the ER for concern for infection.  She denies any fevers but has had rheumatoid arthritis for over 10 years and was recently started on Humira as well.  She continues to have symptoms primarily in her bilateral hand and right elbow for her rheumatoid arthritis.  Because she is having purulent drainage from the elbow she presented to the ED and ED PA spoke with Dr. Roxy Manns from orthopedics who made her n.p.o. and took her to the operating room and she is status post day 1 of an excisional and nonexcisional debridement of the right elbow olecranon and excision of the right elbow rheumatoid nodules  Assessment & Plan:   Principal Problem:   Septic arthritis of elbow, right ([place]) Active Problems:   Diabetes ([place])   Rheumatoid arthritis involving both hands ([place])   Essential hypertension, benign   Hyperlipidemia associated with type 2 diabetes mellitus ([place])   Tobacco use   Prolonged QT interval   Stage 3a chronic kidney disease ([place])   Septic olecranon bursitis of right elbow  Acute Septic Olecranon Bursitis of the right elbow -Rheumatoid arthritis and received a steroid injection of the right elbow 07/03/2021 and now has septic arthritis -She was admitted to [facility] as inpatient and will  continue inpatient hospitalization -Orthopedic surgery was consulted and they were planning on taking the patient for the OR for a washout procedure and this was done yesterday and she is postoperative day 1 of an excisional and nonexcisional debridement of the right elbow olecranon and excision of the right elbow rheumatoid nodules -We will continue empiric antibiotics with IV cefepime and IV vancomycin given her immunocompromise state and given that her MRSA PCR was positive for Staph aureus -Culture of the right olecranon bursa showing:  Specimen Description TISSUE RIGHT OLECRANON BURSA FLUID  Performed at [facility], [place] [address] [place], [place] [address]   Special Requests SWAB   Gram Stain FEW WBC PRESENT, PREDOMINANTLY PMN  FEW GRAM POSITIVE COCCI IN PAIRS  Performed at [facility], [place] [address] [place], [place] [address]    -She does not appear to be systemically ill and there is currently no concern for sepsis -She is not febrile or having any leukocytosis -We will continue empiric antibiotics as above and de-escalate as necessary -ESR was 58 -Getting normal saline at 75 MLS per hour we will need to continue monitor given her history of combined CHF -Orthopedic surgery evaluating and recommending nonweightbearing in the right upper extremity and recommending discussing with a hand team regarding for home antibiotic regimen and complaining of pull the Penrose drain tomorrow -We will follow up on blood cultures x2 and currently only showing no growth to date 1 day -Continue with pain control with  p.o. hydrocodone-acetaminophen 5-325 mg and 7.5-325 mg every 4 hours as needed for moderate and severe pain and continue with morphine 0.52-1 mg every 2 hours as needed severe pain if uncontrolled with p.o. analgesics -Also continue methocarbamol 500 mg p.o./IV every 6 as needed for muscle spasms  Rheumatoid Arthritis -She has fairly advanced disease based on  clinical appearance -She was last seen by Dr. Dimple Casey on 8/15 and has seropositive RA and chronic tophaceous gout -She was recently started on Plaquenil with possible mild improvement -She had the R olecranon bursa injected at her last visit likely seeding infection into her olecranon bursa -Will hold Plaquenil in the setting of active infection -Once source control is obtained, it is likely reasonable to resume Plaquenil, possibly in the next 1-2 days we will continue to hold off for now   Chronic combined CHF -Severe MR, EF 50-55% on 6/29 echo -Appears to be compensated at this time -Continue aspirin 81 mg p.o. every Monday Wednesday Friday as well as Imdur 60 mg p.o. daily and will also continue hydralazine 50 mg every 8 hours -Continue spironolactone 25 mg p.o. daily but hold Lasix for now and stop fluids after 12 more hours -Continue Entresto 97-103 1 tab p.o. twice daily as well as metoprolol succinate 50 mg p.o. daily -Strict I's and O's and daily weights -Continue monitor for signs and symptoms of volume overload -Repeat chest x-ray in a.m.   Diabetes mellitus type 2 -6/16 A1c was 7.0 -Currently holding Tradjenta -Cover with moderate-scale SSI before meals and at bedtime -CBGs ranging from 120-263   HTN -Continue hydralazine 50 mg p.o. every 8, spironolactone 25 mg p.o. daily, Entresto 97-1031 tab p.o. twice daily, metoprolol succinate 50 mg p.o. daily -Continue monitor blood pressures per protocol -Last blood pressure reading was a little elevated at 176/70   HLD -Continue Atorvastatin 40 mg daily   Prolonged QTc -Likely associated with mild dehydration, infection -Will attempt to avoid QT-prolonging medications such as PPI, nausea meds, SSRIs -Repeat EKG in AM    Stage 3a CKD -Appears to be chronic/stable and patient's BUNs/creatinine went from 23/1.05 and is now 28/1.15 -[employer] for now but will continue the rest of her medications as above -Avoid further  nephrotoxic medications, contrast dyes, hypotension and renally adjust medications To continue monitor and trend renal function carefully and repeat CMP in a.m.   Tobacco Abuse -No longer smoking regularly but continues to smoke periodically -Ongoing total cessation should be encouraged  DVT prophylaxis: SCDs Code Status: FULL CODE Family Communication: No family present at bedside  Disposition Plan: Pending further clinical improvement and clearance by orthopedic surgery  Status is: Inpatient  Remains inpatient appropriate because:Unsafe d/c plan, IV treatments appropriate due to intensity of illness or inability to take PO, and Inpatient level of care appropriate due to severity of illness  Dispo: The patient is from: Home              Anticipated d/c is to: Home              Patient currently is not medically stable to d/c.   Difficult to place patient No  Consultants:  Orthopedic Surgery Dr. Durene Romans  Procedures:  PROCEDURES: 1.  Excisional and non-excisional debridement of right elbow olecranon. 2.  Excision of right elbow rheumatoid nodules.  Antimicrobials: Anti-infectives (From admission, onward)    Start     Dose/Rate Route Frequency Ordered Stop   07/12/21 0600  ceFAZolin (ANCEF) IVPB 2g/100 mL premix  Status:  Discontinued        2 g 200 mL/hr over 30 Minutes Intravenous On call to O.R. 07/11/21 1736 07/11/21 1744   07/11/21 1100  ceFEPIme (MAXIPIME) 2 g in sodium chloride 0.9 % 100 mL IVPB        2 g 200 mL/hr over 30 Minutes Intravenous Every 12 hours 07/11/21 1001     07/11/21 1100  vancomycin (VANCOREADY) IVPB 750 mg/150 mL        750 mg 150 mL/hr over 60 Minutes Intravenous Every 24 hours 07/11/21 1001     07/11/21 0100  vancomycin (VANCOCIN) IVPB 1000 mg/200 mL premix        1,000 mg 200 mL/hr over 60 Minutes Intravenous  Once 07/11/21 0056 07/11/21 0336   07/11/21 0100  piperacillin-tazobactam (ZOSYN) IVPB 3.375 g        3.375 g 100 mL/hr over 30  Minutes Intravenous  Once 07/11/21 0056 07/11/21 0336        Subjective: Seen and examined at bedside and she was not complaining of as much pain today.  No nausea or vomiting.  Thinks she is doing a little bit better but was still nauseous morning and vomited.  No chest pain or shortness of breath.  No other concerns or complaints at this time.  Objective: Vitals:   07/12/21 0009 07/12/21 0328 07/12/21 0537 07/12/21 0803  BP: 130/70 (!) 144/79 (!) 177/69 (!) 167/69  Pulse: 77 65 63 60  Resp: $Remo'16 18 18 16  'QsmeY$ Temp: 98.4 F (36.9 C) 98 F (36.7 C) 98 F (36.7 C) 97.6 F (36.4 C)  TempSrc: Oral Oral Oral   SpO2: 100% 100% 98% 100%  Weight:      Height:        Intake/Output Summary (Last 24 hours) at 07/12/2021 0827 Last data filed at 07/12/2021 0600 Gross per 24 hour  Intake 2166.83 ml  Output 320 ml  Net 1846.83 ml   Filed Weights   07/10/21 1759  Weight: 59.9 kg   Examination: Physical Exam:  Constitutional: WN/WD thin elderly African-American female currently in NAD and appears calm and comfortable Eyes: Lids and conjunctivae normal, sclerae anicteric  ENMT: External Ears, Nose appear normal. Grossly normal hearing.  Neck: Appears normal, supple, no cervical masses, normal ROM, no appreciable thyromegaly; no JVD Respiratory: Diminished to auscultation bilaterally, no wheezing, rales, rhonchi or crackles. Normal respiratory effort and patient is not tachypenic. No accessory muscle use.  Unlabored breathing Cardiovascular: RRR, no murmurs / rubs / gallops. S1 and S2 auscultated.  Abdomen: Soft, non-tender, non-distended. Bowel sounds positive.  GU: Deferred. Musculoskeletal: No clubbing / cyanosis of digits/nails.  Right elbow is wrapped in Ace bandage.  She has hand deformities from her rheumatoid arthritis Skin: No rashes, lesions, ulcers on limited skin evaluation. No induration; Warm and dry.  Neurologic: CN 2-12 grossly intact with no focal deficits. Romberg sign and  cerebellar reflexes not assessed.  Psychiatric: Normal judgment and insight. Alert and oriented x 3. Normal mood and appropriate affect.   Data Reviewed: I have personally reviewed following labs and imaging studies  CBC: Recent Labs  Lab 07/10/21 2354 07/12/21 0458  WBC 8.3 8.2  NEUTROABS 6.4  --   HGB 15.0 12.0  HCT 48.0* 38.4  MCV 89.2 89.5  PLT 245 426   Basic Metabolic Panel: Recent Labs  Lab 07/10/21 2354 07/12/21 0458  NA 141 135  K 3.8 4.0  CL 108 107  CO2 24 22  GLUCOSE 103* 208*  BUN 23 28*  CREATININE 1.05* 1.15*  CALCIUM 9.2 8.3*   GFR: Estimated Creatinine Clearance: 40.3 mL/min (A) (by C-G formula based on SCr of 1.15 mg/dL (H)). Liver Function Tests: No results for input(s): AST, ALT, ALKPHOS, BILITOT, PROT, ALBUMIN in the last 168 hours. No results for input(s): LIPASE, AMYLASE in the last 168 hours. No results for input(s): AMMONIA in the last 168 hours. Coagulation Profile: No results for input(s): INR, PROTIME in the last 168 hours. Cardiac Enzymes: No results for input(s): CKTOTAL, CKMB, CKMBINDEX, TROPONINI in the last 168 hours. BNP (last 3 results) No results for input(s): PROBNP in the last 8760 hours. HbA1C: No results for input(s): HGBA1C in the last 72 hours. CBG: Recent Labs  Lab 07/11/21 1035 07/11/21 1103 07/11/21 1633 07/11/21 2043 07/12/21 0738  GLUCAP 63* 120* 67* 263* 176*   Lipid Profile: No results for input(s): CHOL, HDL, LDLCALC, TRIG, CHOLHDL, LDLDIRECT in the last 72 hours. Thyroid Function Tests: No results for input(s): TSH, T4TOTAL, FREET4, T3FREE, THYROIDAB in the last 72 hours. Anemia Panel: No results for input(s): VITAMINB12, FOLATE, FERRITIN, TIBC, IRON, RETICCTPCT in the last 72 hours. Sepsis Labs: No results for input(s): PROCALCITON, LATICACIDVEN in the last 168 hours.  Recent Results (from the past 240 hour(s))  Blood culture (routine x 2)     Status: None (Preliminary result)   Collection Time:  07/10/21 11:54 PM   Specimen: Left Antecubital; Blood  Result Value Ref Range Status   Specimen Description   Final    LEFT ANTECUBITAL Performed at [place] [address] [place], [place] [address]    Special Requests   Final    BOTTLES DRAWN AEROBIC AND ANAEROBIC Blood Culture adequate volume Performed at [place] [address] [place], [place] [address]    Culture   Final    NO GROWTH 1 DAY Performed at [facility], [place] [address] [place], [place] [address]    Report Status PENDING  Incomplete  Blood culture (routine x 2)     Status: None (Preliminary result)   Collection Time: 07/10/21 11:54 PM   Specimen: Right Antecubital; Blood  Result Value Ref Range Status   Specimen Description   Final    RIGHT ANTECUBITAL Performed at [place] [address], [place], [place] [address]    Special Requests   Final    BOTTLES DRAWN AEROBIC AND ANAEROBIC Blood Culture adequate volume Performed at [facility] [address], [place], [place] [address]    Culture   Final    NO GROWTH 1 DAY Performed at [facility], [place] [address], [place], [place] [address]    Report Status PENDING  Incomplete  Resp Panel by RT-PCR (Flu A&B, Covid) Nasopharyngeal Swab     Status: None   Collection Time: 07/11/21  7:54 AM   Specimen: Nasopharyngeal Swab; Nasopharyngeal(NP) swabs in vial transport medium  Result Value Ref Range Status   SARS Coronavirus 2 by RT PCR NEGATIVE NEGATIVE Final    Comment: (NOTE) SARS-CoV-2 target nucleic acids are NOT DETECTED.  The SARS-CoV-2 RNA is generally detectable in upper respiratory specimens during the acute phase of infection. The lowest concentration of SARS-CoV-2 viral copies this assay can detect is 138 copies/mL. A negative result does not preclude SARS-Cov-2 infection and should not be used as the sole basis for treatment or other patient  management decisions. A negative result may occur with  improper specimen  collection/handling, submission of specimen other than nasopharyngeal swab, presence of viral mutation(s) within the areas targeted by this assay, and inadequate number of viral copies(<138 copies/mL). A negative result must be combined with clinical observations, patient history, and epidemiological information. The expected result is Negative.  Fact Sheet for Patients:  EntrepreneurPulse.com.au  Fact Sheet for Healthcare Providers:  IncredibleEmployment.be  This test is no t yet approved or cleared by the [place] FDA and  has been authorized for detection and/or diagnosis of SARS-CoV-2 by FDA under an Emergency Use Authorization (EUA). This EUA will remain  in effect (meaning this test can be used) for the duration of the COVID-19 declaration under Section 564(b)(1) of the Act, 21 U.S.C.section 360bbb-3(b)(1), unless the authorization is terminated  or revoked sooner.       Influenza A by PCR NEGATIVE NEGATIVE Final   Influenza B by PCR NEGATIVE NEGATIVE Final    Comment: (NOTE) The Xpert Xpress SARS-CoV-2/FLU/RSV plus assay is intended as an aid in the diagnosis of influenza from Nasopharyngeal swab specimens and should not be used as a sole basis for treatment. Nasal washings and aspirates are unacceptable for Xpert Xpress SARS-CoV-2/FLU/RSV testing.  Fact Sheet for Patients: EntrepreneurPulse.com.au  Fact Sheet for Healthcare Providers: IncredibleEmployment.be  This test is not yet approved or cleared by the [place] FDA and has been authorized for detection and/or diagnosis of SARS-CoV-2 by FDA under an Emergency Use Authorization (EUA). This EUA will remain in effect (meaning this test can be used) for the duration of the COVID-19 declaration under Section 564(b)(1) of the Act, 21 U.S.C. section 360bbb-3(b)(1),  unless the authorization is terminated or revoked.  Performed at [facility], [place] [address], [place], [place] [address]   Surgical PCR screen     Status: Abnormal   Collection Time: 07/11/21  9:14 AM   Specimen: Nasal Mucosa; Nasal Swab  Result Value Ref Range Status   MRSA, PCR NEGATIVE NEGATIVE Final   Staphylococcus aureus POSITIVE (A) NEGATIVE Final    Comment: (NOTE) The Xpert SA Assay (FDA approved for NASAL specimens in patients 36 years of age and older), is one component of a comprehensive surveillance program. It is not intended to diagnose infection nor to guide or monitor treatment. Performed at [facility], [place] [address], [place], [place] [address]   Aerobic/Anaerobic Culture w Gram Stain (surgical/deep wound)     Status: None (Preliminary result)   Collection Time: 07/11/21  4:32 PM   Specimen: PATH Other; Tissue  Result Value Ref Range Status   Specimen Description   Final    TISSUE RIGHT OLECRANON BURSA Performed at [facility] [address], [place], [place] [address]    Special Requests   Final    NONE Performed at [facility], [place] [address], [place],  [address]    Gram Stain   Final    FEW WBC PRESENT, PREDOMINANTLY PMN NO ORGANISMS SEEN Performed at [facility], [place] [address] [place],  [address]    Culture PENDING  Incomplete   Report Status PENDING  Incomplete  Aerobic/Anaerobic Culture w Gram Stain (surgical/deep wound)     Status: None (Preliminary result)   Collection Time: 07/11/21  4:36 PM   Specimen: PATH Other; Tissue  Result Value Ref Range Status   Specimen Description   Final    TISSUE RIGHT OLECRANON BURSA FLUID Performed at [facility], [place] [address], [place],  [address]  Special Requests SWAB  Final   Gram Stain   Final    FEW WBC PRESENT, PREDOMINANTLY PMN FEW GRAM POSITIVE COCCI IN  PAIRS Performed at [facility], [place] [address], [place], [place] [address]    Culture PENDING  Incomplete   Report Status PENDING  Incomplete    RN Pressure Injury Documentation:     Estimated body mass index is 23.38 kg/m as calculated from the following:   Height as of this encounter: $RemoveBeforeD'5\' 3"'[id number]$  (1.6 m).   Weight as of this encounter: 59.9 kg.  Malnutrition Type:   Malnutrition Characteristics:   Nutrition Interventions:    Radiology Studies: DG Elbow Complete Right  Result Date: 07/10/2021 CLINICAL DATA:  Swelling, drainage, history of rheumatoid arthritis EXAM: RIGHT ELBOW - COMPLETE 3+ VIEW COMPARISON:  None. FINDINGS: There is no acute fracture or dislocation. Elbow alignment is maintained. There is soft tissue swelling along the dorsal aspect of the elbow with focal outpouching overlying the proximal radius. There is no definite soft tissue gas. There is no elbow effusion. IMPRESSION: 1. Soft tissue swelling over the dorsum of the elbow with focal outpouching over the proximal radius. Targeted ultrasound may be useful to assess for drainable fluid collection. 2. No acute osseous abnormality. Electronically Signed   By: Valetta Mole M.D.   On: 07/10/2021 19:01    Scheduled Meds:  aspirin EC  81 mg Oral Q M,W,F   atorvastatin  40 mg Oral Daily   dexamethasone (DECADRON) injection  10 mg Intravenous Once   docusate sodium  100 mg Oral BID   hydrALAZINE  50 mg Oral Q8H   insulin aspart  0-15 Units Subcutaneous TID WC   insulin aspart  0-5 Units Subcutaneous QHS   isosorbide mononitrate  60 mg Oral Daily   metoprolol succinate  50 mg Oral Daily   mupirocin ointment  1 application Nasal BID   sacubitril-valsartan  1 tablet Oral BID   sodium chloride flush  3 mL Intravenous Q12H   spironolactone  25 mg Oral Daily   Continuous Infusions:  sodium chloride 75 mL/hr at 07/12/21 0748   ceFEPime (MAXIPIME) IV 2 g (07/11/21 2243)   lactated ringers 75 mL/hr at 07/11/21 1503    methocarbamol (ROBAXIN) IV     vancomycin 750 mg (07/11/21 1221)    LOS: 1 day   Kerney Elbe, DO [facility] PAGER is on AMION  If 7PM-7AM, please contact night-coverage [email or web address]

## 2021-07-12 NOTE — Plan of Care (Signed)
  Problem: Health Behavior/Discharge Planning: Goal: Ability to manage health-related needs will improve Outcome: Progressing   Problem: Pain Managment: Goal: General experience of comfort will improve Outcome: Progressing   Problem: Skin Integrity: Goal: Risk for impaired skin integrity will decrease Outcome: Progressing   

## 2021-07-12 NOTE — Evaluation (Signed)
Physical Therapy Evaluation Patient Details Name: Maria Foster MRN: [id number] DOB: August 05, 1956 Today's Date: 07/12/2021   History of Present Illness  65 yo female admitted with R septic elbow s/p I&D and excision of rheumatoid nodules. Hx of RA, DM, CKD, diabetic neuropathy  Clinical Impression  On eval, pt required Min A for mobility. She walked ~150 feet with use of IV pole for ~75 feet. Pt reported minimal pain with activity. She is unsteady and had LOB x 2 during session. Pt could benefit from, at least, cane use for ambulation safety if she would be agreeable. Will continue to follow and progress activity as tolerated.     Follow Up Recommendations Home health PT;Supervision/Assistance - 24 hour    Equipment Recommendations   (could benefit from cane use for ambulation safety if pt is agreeable)    Recommendations for Other Services       Precautions / Restrictions Precautions Precautions: Fall Precaution Comments: dressing/ace wrap in place R arm Restrictions Weight Bearing Restrictions: Yes RUE Weight Bearing: Non weight bearing      Mobility  Bed Mobility Overal bed mobility: Modified Independent                  Transfers Overall transfer level: Needs assistance   Transfers: Sit to/from Stand Sit to Stand: Min assist         General transfer comment: Assist to steady.  Ambulation/Gait Ambulation/Gait assistance: Min Chemical engineer (Feet): 150 Feet Assistive device: None;IV Pole Gait Pattern/deviations: Step-through pattern;Decreased stride length     General Gait Details: LOB x 2 during session. Tolerated distance well. Walked with 1 hand support on IV pole vs no device  Stairs            Wheelchair Mobility    Modified Rankin (Stroke Patients Only)       Balance Overall balance assessment: Needs assistance         Standing balance support: During functional activity Standing balance-Leahy Scale: Fair                                Pertinent Vitals/Pain Pain Assessment: No/denies pain    Home Living Family/patient expects to be discharged to:: Private residence Living Arrangements: Spouse/significant other   Type of Home: House Home Access: Stairs to enter Entrance Stairs-Rails: Right Entrance Stairs-Number of Steps: 4 Home Layout: One level Home Equipment: None      Prior Function Level of Independence: Independent               Hand Dominance        Extremity/Trunk Assessment   Upper Extremity Assessment Upper Extremity Assessment: Defer to OT evaluation    Lower Extremity Assessment Lower Extremity Assessment: Generalized weakness    Cervical / Trunk Assessment Cervical / Trunk Assessment: Normal  Communication   Communication: No difficulties  Cognition Arousal/Alertness: Awake/alert Behavior During Therapy: WFL for tasks assessed/performed Overall Cognitive Status: Within Functional Limits for tasks assessed                                        General Comments      Exercises     Assessment/Plan    PT Assessment Patient needs continued PT services  PT Problem List Decreased strength;Decreased mobility;Decreased balance;Decreased knowledge of use of DME  PT Treatment Interventions DME instruction;Gait training;Therapeutic exercise;Balance training;Functional mobility training;Therapeutic activities;Patient/family education    PT Goals (Current goals can be found in the Care Plan section)  Acute Rehab PT Goals Patient Stated Goal: home soon PT Goal Formulation: With patient Time For Goal Achievement: 07/26/21 Potential to Achieve Goals: Good    Frequency Min 3X/week   Barriers to discharge        Co-evaluation               AM-PAC PT "6 Clicks" Mobility  Outcome Measure Help needed turning from your back to your side while in a flat bed without using bedrails?: None Help needed moving from lying on your  back to sitting on the side of a flat bed without using bedrails?: None Help needed moving to and from a bed to a chair (including a wheelchair)?: A Little Help needed standing up from a chair using your arms (e.g., wheelchair or bedside chair)?: A Little Help needed to walk in hospital room?: A Little Help needed climbing 3-5 steps with a railing? : A Little 6 Click Score: 20    End of Session Equipment Utilized During Treatment: Gait belt Activity Tolerance: Patient tolerated treatment well Patient left: in bed;with call bell/phone within reach;with bed alarm set   PT Visit Diagnosis: Unsteadiness on feet (R26.81);Muscle weakness (generalized) (M62.81)    Time: 2774-1287 PT Time Calculation (min) (ACUTE ONLY): 15 min   Charges:   PT Evaluation $PT Eval Moderate Complexity: 1 Mod             Faye Ramsay, PT Acute Rehabilitation  Office: [phone] Pager: [phone]

## 2021-07-13 LAB — CBC WITH DIFFERENTIAL/PLATELET
Abs Immature Granulocytes: 0.02 10*3/uL (ref 0.00–0.07)
Basophils Absolute: 0 10*3/uL (ref 0.0–0.1)
Basophils Relative: 0 %
Eosinophils Absolute: 0 10*3/uL (ref 0.0–0.5)
Eosinophils Relative: 0 %
HCT: 42.8 % (ref 36.0–46.0)
Hemoglobin: 13.8 g/dL (ref 12.0–15.0)
Immature Granulocytes: 0 %
Lymphocytes Relative: 13 %
Lymphs Abs: 1.4 10*3/uL (ref 0.7–4.0)
MCH: 28 pg (ref 26.0–34.0)
MCHC: 32.2 g/dL (ref 30.0–36.0)
MCV: 87 fL (ref 80.0–100.0)
Monocytes Absolute: 0.8 10*3/uL (ref 0.1–1.0)
Monocytes Relative: 7 %
Neutro Abs: 9.2 10*3/uL — ABNORMAL HIGH (ref 1.7–7.7)
Neutrophils Relative %: 80 %
Platelets: 218 10*3/uL (ref 150–400)
RBC: 4.92 MIL/uL (ref 3.87–5.11)
RDW: 16 % — ABNORMAL HIGH (ref 11.5–15.5)
WBC: 11.5 10*3/uL — ABNORMAL HIGH (ref 4.0–10.5)
nRBC: 0 % (ref 0.0–0.2)

## 2021-07-13 LAB — COMPREHENSIVE METABOLIC PANEL
ALT: 12 U/L (ref 0–44)
AST: 13 U/L — ABNORMAL LOW (ref 15–41)
Albumin: 3.6 g/dL (ref 3.5–5.0)
Alkaline Phosphatase: 99 U/L (ref 38–126)
Anion gap: 8 (ref 5–15)
BUN: 21 mg/dL (ref 8–23)
CO2: 20 mmol/L — ABNORMAL LOW (ref 22–32)
Calcium: 8.9 mg/dL (ref 8.9–10.3)
Chloride: 109 mmol/L (ref 98–111)
Creatinine, Ser: 0.85 mg/dL (ref 0.44–1.00)
GFR, Estimated: >60 mL/min (ref >60)
Glucose, Bld: 135 mg/dL — ABNORMAL HIGH (ref 70–99)
Potassium: 3.9 mmol/L (ref 3.5–5.1)
Sodium: 137 mmol/L (ref 135–145)
Total Bilirubin: 0.9 mg/dL (ref 0.3–1.2)
Total Protein: 8 g/dL (ref 6.5–8.1)

## 2021-07-13 LAB — GLUCOSE, CAPILLARY
Glucose-Capillary: 145 mg/dL — ABNORMAL HIGH (ref 70–99)
Glucose-Capillary: 128 mg/dL — ABNORMAL HIGH (ref 70–99)
Glucose-Capillary: 216 mg/dL — ABNORMAL HIGH (ref 70–99)
Glucose-Capillary: 102 mg/dL — ABNORMAL HIGH (ref 70–99)

## 2021-07-13 LAB — MAGNESIUM: Magnesium: 2.1 mg/dL (ref 1.7–2.4)

## 2021-07-13 LAB — PHOSPHORUS: Phosphorus: 2.3 mg/dL — ABNORMAL LOW (ref 2.5–4.6)

## 2021-07-13 MED ORDER — K PHOS MONO-SOD PHOS DI & MONO 155-852-130 MG PO TABS
500.0000 mg | ORAL_TABLET | Freq: Once | ORAL | Status: AC
  Administered 2021-07-13: 500 mg via ORAL
  Filled 2021-07-13: qty 2

## 2021-07-13 NOTE — TOC Progression Note (Signed)
Transition of Care [facility]) - Progression Note    Patient Details  Name: Maria Foster MRN: [id number] Date of Birth: Jun 30, 1956  [facility] CM/SW Contact  Lakiah Dhingra, Olegario Messier, RN Phone Number: 07/13/2021, 3:46 PM  Clinical Narrative: Iantha Fallen HHPT-rep Amy aware;[employer] rep Zach-Straight angle cane to deliver to rm prior d/c. Currently no long term iv abx needed @ home.Continue to follow for d/c plans.      Expected Discharge Plan: Home w Home Health Services Barriers to Discharge: Continued Medical Work up  Expected Discharge Plan and Services Expected Discharge Plan: Home w Home Health Services   Discharge Planning Services: CM Consult   Living arrangements for the past 2 months: Single Family Home                 DME Arranged: Gilmer Mor DME Agency: [employer] Date DME Agency Contacted: 07/13/21 Time DME Agency Contacted: [phone] Representative spoke with at DME Agency: Ian Malkin HH Arranged: PT HH Agency: [facility] Date [facility] Agency Contacted: 07/13/21 Time HH Agency Contacted: [phone] Representative spoke with at [facility] Agency: Amy   Social Determinants of Health (SDOH) Interventions    Readmission Risk Interventions No flowsheet data found.

## 2021-07-13 NOTE — Progress Notes (Signed)
PROGRESS NOTE    Maria Foster  TDS:[id number] DOB: Jan 31, 1956 DOA: 07/10/2021 PCP: Carlena Hurl, PA-C  Brief Narrative:  The patient is a 65 year old African-American female with a past medical history significant for but not limited to diabetes mellitus type 2, hypertension, rheumatoid arthritis on Plaquenil, history of chronic mild CHF, CKD stage IIIa, as well as other comorbidities who presented with a right elbow infection following a steroid injection a week ago.  She saw Dr. Benjamine Mola last Monday and gave her steroid shot in her arm because it is really sore.  On Sunday a.m. she woke up in a little knot on her arm that burst and there is drainage.  She sent a picture to Dr. Benjamine Mola and encouraged to come to the ER for concern for infection.  She denies any fevers but has had rheumatoid arthritis for over 10 years and was recently started on Humira as well.  She continues to have symptoms primarily in her bilateral hand and right elbow for her rheumatoid arthritis.  Because she is having purulent drainage from the elbow she presented to the ED and ED PA spoke with Dr. Alvan Dame from orthopedics who made her n.p.o. and took her to the operating room and she is status post day 2 of an excisional and nonexcisional debridement of the right elbow olecranon and excision of the right elbow rheumatoid nodules.  Wound cultures are growing staph aureus but will await final sensitivities prior to discharging home  Assessment & Plan:   Principal Problem:   Septic arthritis of elbow, right (HCC) Active Problems:   Diabetes ([place])   Rheumatoid arthritis involving both hands ([place])   Essential hypertension, benign   Hyperlipidemia associated with type 2 diabetes mellitus (HCC)   Tobacco use   Prolonged QT interval   Stage 3a chronic kidney disease (HCC)   Septic olecranon bursitis of right elbow  Acute Septic Olecranon Bursitis of the right elbow -Rheumatoid arthritis and received a steroid injection of the  right elbow 07/03/2021 and now has septic arthritis -She was admitted to [facility] as inpatient and will continue inpatient hospitalization -Orthopedic surgery was consulted and they were planning on taking the patient for the OR for a washout procedure and this was done yesterday and she is postoperative day 1 of an excisional and nonexcisional debridement of the right elbow olecranon and excision of the right elbow rheumatoid nodules -We will continue empiric antibiotics with IV vancomycin given her immunocompromised state and given that her MRSA PCR was positive for Staph aureus as well as a culture growing staph aureus; will discontinue IV cefepime -Culture of the right olecranon bursa showing:  Gram Stain FEW WBC PRESENT, PREDOMINANTLY PMN  NO ORGANISMS SEEN   Culture ABUNDANT STAPHYLOCOCCUS AUREUS  SUSCEPTIBILITIES TO FOLLOW  Performed at [facility], [address] [place],  [address]    -She does not appear to be systemically ill and there is currently no concern for sepsis -She is not febrile or having any leukocytosis -We will continue empiric antibiotics as above and de-escalate as necessary -ESR was 58 -IV fluid hydration has now stopped -Orthopedic surgery evaluating and recommending nonweightbearing in the right upper extremity and recommending discussing with a hand team regarding for home antibiotic regimen and today her Penrose drain was removed -We will follow up on blood cultures x2 and currently only showing no growth to date 2 days -Continue with pain control with p.o. hydrocodone-acetaminophen 5-325 mg and 7.5-325 mg every 4 hours as  needed for moderate and severe pain and continue with morphine 0.52-1 mg every 2 hours as needed severe pain if uncontrolled with p.o. analgesics -Also continue methocarbamol 500 mg p.o./IV every 6 as needed for muscle spasms -Orthopedics changed her dressing today and her drain was removed and recommending home today on p.o.  antibiotics likely doxycycline however will need to await sensitivities given that her culture is growing staph aureus.  Orthopedics recommends daily dressing changes and returning to clinic next week  Rheumatoid Arthritis -She has fairly advanced disease based on clinical appearance -She was last seen by Dr. Benjamine Mola on 8/15 and has seropositive RA and chronic tophaceous gout -She was recently started on Plaquenil with possible mild improvement -She had the R olecranon bursa injected at her last visit likely seeding infection into her olecranon bursa -Will hold Plaquenil in the setting of active infection -Once source control is obtained, it is likely reasonable to resume Plaquenil, possibly in the next 1-2 days we will continue to hold off for now until sensitivities and going back   Chronic combined CHF -Severe MR, EF 50-55% on 6/29 echo -Appears to be compensated at this time -Continue aspirin 81 mg p.o. every Monday Wednesday Friday as well as Imdur 60 mg p.o. daily and will also continue hydralazine 50 mg every 8 hours -Continue spironolactone 25 mg p.o. daily but hold Lasix for now and stop fluids after 12 more hours -Continue Entresto 97-103 1 tab p.o. twice daily as well as metoprolol succinate 50 mg p.o. daily -Strict I's and O's and daily weights: She is +3.353 Liters  -Continue monitor for signs and symptoms of volume overload -Repeat chest x-ray in a.m.   Diabetes mellitus type 2 -6/16 A1c was 7.0 -Currently holding Tradjenta -Cover with moderate-scale SSI before meals and at bedtime -CBGs ranging from 123-284   Leukocytosis -Mild and Reactive -WBC went from 8.2 -> 11.5 -C/w Abx as Above -Continue to Monitor and Trend and Repeat CBC in the AM   HTN -Continue hydralazine 50 mg p.o. every 8, spironolactone 25 mg p.o. daily, Entresto 97-1031 tab p.o. twice daily, metoprolol succinate 50 mg p.o. daily -Continue monitor blood pressures per protocol -Last blood pressure reading  was elevated at 189/66   HLD -Continue Atorvastatin 40 mg daily   Prolonged QTc -Likely associated with mild dehydration, infection -Will attempt to avoid QT-prolonging medications such as PPI, nausea meds, SSRIs -Repeat EKG yesterday showed a qTC of 493   Stage 3a CKD Metabolic Acidosis  -Appears to be chronic/stable and patient's BUNs/creatinine went from 23/1.05 and is now 28/1.15 -> 21/0.85 -Patient has a Metabolic Acidosis with a CO2 of 20, AG of 8, and Chloride Level of 109 -Hold Farxiga for now but will continue the rest of her medications as above -Avoid further nephrotoxic medications, contrast dyes, hypotension and renally adjust medications -Continue monitor and trend renal function carefully and repeat CMP in a.m.   Tobacco Abuse -No longer smoking regularly but continues to smoke periodically -Ongoing total cessation should be encouraged  DVT prophylaxis: SCDs Code Status: FULL CODE Family Communication: No family present at bedside  Disposition Plan: Pending further clinical improvement and clearance by orthopedic surgery; currently awaiting wound culture results and anticipating discharging home in the next 24 to 48 hours  Status is: Inpatient  Remains inpatient appropriate because:Unsafe d/c plan, IV treatments appropriate due to intensity of illness or inability to take PO, and Inpatient level of care appropriate due to severity of illness  Dispo: The patient  is from: Home              Anticipated d/c is to: Home              Patient currently is not medically stable to d/c.   Difficult to place patient No  Consultants:  Orthopedic Surgery Dr. Paralee Cancel  Procedures:  PROCEDURES: 1.  Excisional and non-excisional debridement of right elbow olecranon. 2.  Excision of right elbow rheumatoid nodules.  Antimicrobials: Anti-infectives (From admission, onward)    Start     Dose/Rate Route Frequency Ordered Stop   07/12/21 0600  ceFAZolin (ANCEF) IVPB  2g/100 mL premix  Status:  Discontinued        2 g 200 mL/hr over 30 Minutes Intravenous On call to O.R. 07/11/21 1736 07/11/21 1744   07/11/21 1100  ceFEPIme (MAXIPIME) 2 g in sodium chloride 0.9 % 100 mL IVPB  Status:  Discontinued        2 g 200 mL/hr over 30 Minutes Intravenous Every 12 hours 07/11/21 1001 07/13/21 1338   07/11/21 1100  vancomycin (VANCOREADY) IVPB 750 mg/150 mL        750 mg 150 mL/hr over 60 Minutes Intravenous Every 24 hours 07/11/21 1001     07/11/21 0100  vancomycin (VANCOCIN) IVPB 1000 mg/200 mL premix        1,000 mg 200 mL/hr over 60 Minutes Intravenous  Once 07/11/21 0056 07/11/21 0336   07/11/21 0100  piperacillin-tazobactam (ZOSYN) IVPB 3.375 g        3.375 g 100 mL/hr over 30 Minutes Intravenous  Once 07/11/21 0056 07/11/21 0336        Subjective: Seen and examined at bedside says that she is feeling good today.  No nausea or vomiting.  Thinks she is doing better and arm is feeling better.  Had a Penrose drain removed.  Orthopedic surgery evaluated and recommended the patient can be discharged from the standpoint however still awaiting wound sensitivities as it is growing staph aureus.  No other concerns or complaints at this time.  Objective: Vitals:   07/12/21 1610 07/12/21 2032 07/13/21 0607 07/13/21 1149  BP: (!) 186/77 (!) 153/83 (!) 175/86 (!) 189/66  Pulse: 65 72 68 (!) 59  Resp: $Remo'18  18 17  'xdBtP$ Temp: 98.2 F (36.8 C) 98.4 F (36.9 C) 98.5 F (36.9 C) 97.6 F (36.4 C)  TempSrc: Oral Oral Oral Oral  SpO2: 97% 99% 97% 100%  Weight:      Height:        Intake/Output Summary (Last 24 hours) at 07/13/2021 1440 Last data filed at 07/13/2021 4174 Gross per 24 hour  Intake 1949.25 ml  Output 550 ml  Net 1399.25 ml    Filed Weights   07/10/21 1759  Weight: 59.9 kg   Examination: Physical Exam:  Constitutional: WN/WD thin elderly AAF in NAD and appears calm and comfortable Eyes: Lids and conjunctivae normal, sclerae anicteric  ENMT:  External Ears, Nose appear normal. Grossly normal hearing. Mucous membranes are moist. No JVD Neck: Diminished normal, supple, no cervical masses, normal ROM, no appreciable thyromegaly; no JVD Respiratory: Slightly diminished to auscultation bilaterally, no wheezing, rales, rhonchi or crackles. Normal respiratory effort and patient is not tachypenic. Unlabored breathing  Cardiovascular: RRR, no murmurs / rubs / gallops. S1 and S2 auscultated. No extremity edema. 2+ pedal pulses. No carotid bruits.  Abdomen: Soft, non-tender, non-distended. Bowel sounds positive.  GU: Deferred. Musculoskeletal: No clubbing / cyanosis of digits/nails. No joint deformity upper and  lower extremities.  Skin: No rashes, lesions, ulcers. No induration; Warm and dry.  Neurologic: CN 2-12 grossly intact with no focal deficits. Romberg sign and cerebellar reflexes not assessed.  Psychiatric: Normal judgment and insight. Alert and oriented x 3. Normal mood and appropriate affect.   Data Reviewed: I have personally reviewed following labs and imaging studies  CBC: Recent Labs  Lab 07/10/21 2354 07/12/21 0458 07/13/21 0436  WBC 8.3 8.2 11.5*  NEUTROABS 6.4  --  9.2*  HGB 15.0 12.0 13.8  HCT 48.0* 38.4 42.8  MCV 89.2 89.5 87.0  PLT 245 192 579    Basic Metabolic Panel: Recent Labs  Lab 07/10/21 2354 07/12/21 0458 07/13/21 0436  NA 141 135 137  K 3.8 4.0 3.9  CL 108 107 109  CO2 24 22 20*  GLUCOSE 103* 208* 135*  BUN 23 28* 21  CREATININE 1.05* 1.15* 0.85  CALCIUM 9.2 8.3* 8.9  MG  --   --  2.1  PHOS  --   --  2.3*    GFR: Estimated Creatinine Clearance: 54.6 mL/min (by C-G formula based on SCr of 0.85 mg/dL). Liver Function Tests: Recent Labs  Lab 07/13/21 0436  AST 13*  ALT 12  ALKPHOS 99  BILITOT 0.9  PROT 8.0  ALBUMIN 3.6   No results for input(s): LIPASE, AMYLASE in the last 168 hours. No results for input(s): AMMONIA in the last 168 hours. Coagulation Profile: No results for  input(s): INR, PROTIME in the last 168 hours. Cardiac Enzymes: No results for input(s): CKTOTAL, CKMB, CKMBINDEX, TROPONINI in the last 168 hours. BNP (last 3 results) No results for input(s): PROBNP in the last 8760 hours. HbA1C: No results for input(s): HGBA1C in the last 72 hours. CBG: Recent Labs  Lab 07/12/21 1154 07/12/21 1636 07/12/21 2029 07/13/21 0721 07/13/21 1148  GLUCAP 122* 215* 284* 145* 128*    Lipid Profile: No results for input(s): CHOL, HDL, LDLCALC, TRIG, CHOLHDL, LDLDIRECT in the last 72 hours. Thyroid Function Tests: No results for input(s): TSH, T4TOTAL, FREET4, T3FREE, THYROIDAB in the last 72 hours. Anemia Panel: No results for input(s): VITAMINB12, FOLATE, FERRITIN, TIBC, IRON, RETICCTPCT in the last 72 hours. Sepsis Labs: No results for input(s): PROCALCITON, LATICACIDVEN in the last 168 hours.  Recent Results (from the past 240 hour(s))  Blood culture (routine x 2)     Status: None (Preliminary result)   Collection Time: 07/10/21 11:54 PM   Specimen: Left Antecubital; Blood  Result Value Ref Range Status   Specimen Description   Final    LEFT ANTECUBITAL Performed at [facility] [address], [place], [place] [address]    Special Requests   Final    BOTTLES DRAWN AEROBIC AND ANAEROBIC Blood Culture adequate volume Performed at [facility] [address] [place], [place] [address]    Culture   Final    NO GROWTH 2 DAYS Performed at [facility] [address] [place], [place] [address]    Report Status PENDING  Incomplete  Blood culture (routine x 2)     Status: None (Preliminary result)   Collection Time: 07/10/21 11:54 PM   Specimen: Right Antecubital; Blood  Result Value Ref Range Status   Specimen Description   Final    RIGHT ANTECUBITAL Performed at [facility] [address], [place], [place] [address]    Special Requests   Final    BOTTLES DRAWN AEROBIC AND  ANAEROBIC Blood Culture adequate  volume Performed at [facility], [place] [address] [place], [place] [address]    Culture   Final    NO GROWTH 2 DAYS Performed at [facility] [address], [place], [place] [address]    Report Status PENDING  Incomplete  Resp Panel by RT-PCR (Flu A&B, Covid) Nasopharyngeal Swab     Status: None   Collection Time: 07/11/21  7:54 AM   Specimen: Nasopharyngeal Swab; Nasopharyngeal(NP) swabs in vial transport medium  Result Value Ref Range Status   SARS Coronavirus 2 by RT PCR NEGATIVE NEGATIVE Final    Comment: (NOTE) SARS-CoV-2 target nucleic acids are NOT DETECTED.  The SARS-CoV-2 RNA is generally detectable in upper respiratory specimens during the acute phase of infection. The lowest concentration of SARS-CoV-2 viral copies this assay can detect is 138 copies/mL. A negative result does not preclude SARS-Cov-2 infection and should not be used as the sole basis for treatment or other patient management decisions. A negative result may occur with  improper specimen collection/handling, submission of specimen other than nasopharyngeal swab, presence of viral mutation(s) within the areas targeted by this assay, and inadequate number of viral copies(<138 copies/mL). A negative result must be combined with clinical observations, patient history, and epidemiological information. The expected result is Negative.  Fact Sheet for Patients:  EntrepreneurPulse.com.au  Fact Sheet for Healthcare Providers:  IncredibleEmployment.be  This test is no t yet approved or cleared by the [place] FDA and  has been authorized for detection and/or diagnosis of SARS-CoV-2 by FDA under an Emergency Use Authorization (EUA). This EUA will remain  in effect (meaning this test can be used) for the duration of the COVID-19 declaration under Section 564(b)(1) of the Act, 21 U.S.C.section 360bbb-3(b)(1),  unless the authorization is terminated  or revoked sooner.       Influenza A by PCR NEGATIVE NEGATIVE Final   Influenza B by PCR NEGATIVE NEGATIVE Final    Comment: (NOTE) The Xpert Xpress SARS-CoV-2/FLU/RSV plus assay is intended as an aid in the diagnosis of influenza from Nasopharyngeal swab specimens and should not be used as a sole basis for treatment. Nasal washings and aspirates are unacceptable for Xpert Xpress SARS-CoV-2/FLU/RSV testing.  Fact Sheet for Patients: EntrepreneurPulse.com.au  Fact Sheet for Healthcare Providers: IncredibleEmployment.be  This test is not yet approved or cleared by the [place] FDA and has been authorized for detection and/or diagnosis of SARS-CoV-2 by FDA under an Emergency Use Authorization (EUA). This EUA will remain in effect (meaning this test can be used) for the duration of the COVID-19 declaration under Section 564(b)(1) of the Act, 21 U.S.C. section 360bbb-3(b)(1), unless the authorization is terminated or revoked.  Performed at [facility], [place] [address], [place], [place] [address]   Surgical PCR screen     Status: Abnormal   Collection Time: 07/11/21  9:14 AM   Specimen: Nasal Mucosa; Nasal Swab  Result Value Ref Range Status   MRSA, PCR NEGATIVE NEGATIVE Final   Staphylococcus aureus POSITIVE (A) NEGATIVE Final    Comment: (NOTE) The Xpert SA Assay (FDA approved for NASAL specimens in patients 83 years of age and older), is one component of a comprehensive surveillance program. It is not intended to diagnose infection nor to guide or monitor treatment. Performed at [facility], [place] [address] [place],  [address]   Aerobic/Anaerobic Culture w Gram Stain (surgical/deep wound)     Status: None (Preliminary result)   Collection Time: 07/11/21  4:32 PM   Specimen: PATH Other; Tissue  Result Value Ref Range Status   Specimen Description    Final    TISSUE RIGHT OLECRANON BURSA Performed at [facility] [address] [place], [place] [address]    Special Requests   Final    NONE Performed at [facility], [place] [address], [place], [place] [address]    Gram Stain   Final    FEW WBC PRESENT, PREDOMINANTLY PMN NO ORGANISMS SEEN    Culture   Final    ABUNDANT STAPHYLOCOCCUS AUREUS SUSCEPTIBILITIES TO FOLLOW Performed at [facility], [place] [address], [place], [place] [address]    Report Status PENDING  Incomplete  Aerobic/Anaerobic Culture w Gram Stain (surgical/deep wound)     Status: None (Preliminary result)   Collection Time: 07/11/21  4:36 PM   Specimen: PATH Other; Tissue  Result Value Ref Range Status   Specimen Description   Final    TISSUE RIGHT OLECRANON BURSA FLUID Performed at [facility], [place] [address], [place], [place] [address]    Special Requests SWAB  Final   Gram Stain   Final    FEW WBC PRESENT, PREDOMINANTLY PMN FEW GRAM POSITIVE COCCI IN PAIRS    Culture   Final    ABUNDANT STAPHYLOCOCCUS AUREUS SUSCEPTIBILITIES TO FOLLOW Performed at [facility], [place] [address], [place], [place] [address]    Report Status PENDING  Incomplete     RN Pressure Injury Documentation:     Estimated body mass index is 23.38 kg/m as calculated from the following:   Height as of this encounter: $RemoveBeforeD'5\' 3"'Dlqgxsrecypcwe$  (1.6 m).   Weight as of this encounter: 59.9 kg.  Malnutrition Type:   Malnutrition Characteristics:   Nutrition Interventions:    Radiology Studies: No results found.  Scheduled Meds:  aspirin EC  81 mg Oral Q M,W,F   atorvastatin  40 mg Oral Daily   docusate sodium  100 mg Oral BID   hydrALAZINE  50 mg Oral Q8H   insulin aspart  0-15 Units Subcutaneous TID WC   insulin aspart  0-5 Units Subcutaneous QHS   isosorbide mononitrate  60 mg Oral Daily   metoprolol succinate  50 mg Oral Daily   mupirocin ointment  1 application  Nasal BID   sacubitril-valsartan  1 tablet Oral BID   sodium chloride flush  3 mL Intravenous Q12H   spironolactone  25 mg Oral Daily   Continuous Infusions:  methocarbamol (ROBAXIN) IV     vancomycin 750 mg (07/13/21 1134)    LOS: 2 days   Kerney Elbe, DO [facility] PAGER is on AMION  If 7PM-7AM, please contact night-coverage [email or web address]

## 2021-07-13 NOTE — Evaluation (Signed)
Occupational Therapy Evaluation Patient Details Name: Maria Foster MRN: [id number] DOB: [birth year] Today's Date: 07/13/2021    History of Present Illness 65 yo female admitted with R septic elbow s/p I&D and excision of rheumatoid nodules. Hx of RA, DM, CKD, diabetic neuropathy   Clinical Impression   Patient lives at home with spouse, fully independent at baseline. Patient educated on importance of elevation and exercise to promote circulation and healing to R UE including fist pumps. Patient verbalize understanding and return demonstrate. Patient able to perform upper body, lower body dressing, toilet transfer and ambulation in room without assistance. No further acute OT needs at this time, will sign off. Please re-consult if new needs arise.     Follow Up Recommendations  Follow surgeon's recommendation for DC plan and follow-up therapies    Equipment Recommendations  None recommended by OT           Precautions / Restrictions Precautions Precautions: Fall Precaution Comments: dressing/ace wrap in place R elbow/forearm Restrictions Weight Bearing Restrictions: Yes RUE Weight Bearing: Non weight bearing      Mobility Bed Mobility               General bed mobility comments: sitting EOB upon arrival    Transfers Overall transfer level: Independent Equipment used: None                  Balance Overall balance assessment: No apparent balance deficits (not formally assessed)                                         ADL either performed or assessed with clinical judgement   ADL Overall ADL's : Independent                                       General ADL Comments: patient able to perform UB + LB dressing, toilet transfer and functional ambulation in room without assistance. Educated patient on elevating R arm as well as fist pumps to promote circulation/healing.                         Pertinent Vitals/Pain  Pain Assessment: Faces Faces Pain Scale: Hurts a little bit Pain Location: R UE/elbow Pain Descriptors / Indicators: Sore Pain Intervention(s): Monitored during session     Hand Dominance Right   Extremity/Trunk Assessment Upper Extremity Assessment Upper Extremity Assessment: RUE deficits/detail RUE Deficits / Details: grossly intact ROM except R elbow due to soft cast/ace wrap   Lower Extremity Assessment Lower Extremity Assessment: Defer to PT evaluation   Cervical / Trunk Assessment Cervical / Trunk Assessment: Normal   Communication Communication Communication: No difficulties   Cognition Arousal/Alertness: Awake/alert Behavior During Therapy: WFL for tasks assessed/performed Overall Cognitive Status: Within Functional Limits for tasks assessed                                                Home Living Family/patient expects to be discharged to:: Private residence Living Arrangements: Spouse/significant other Available Help at Discharge: Family;Available PRN/intermittently Type of Home: House Home Access: Stairs to enter [employer] of Steps: 4 Entrance Stairs-Rails: Right Home Layout:  One level     Bathroom Shower/Tub: Producer, television/film/video:  (toilet riser)     Home Equipment: Toilet riser          Prior Functioning/Environment Level of Independence: Independent                 OT Problem List: Pain         OT Goals(Current goals can be found in the care plan section) Acute Rehab OT Goals Patient Stated Goal: home today OT Goal Formulation: All assessment and education complete, DC therapy   AM-PAC OT "6 Clicks" Daily Activity     Outcome Measure Help from another person eating meals?: None Help from another person taking care of personal grooming?: None Help from another person toileting, which includes using toliet, bedpan, or urinal?: None Help from another person bathing (including washing,  rinsing, drying)?: None Help from another person to put on and taking off regular upper body clothing?: None Help from another person to put on and taking off regular lower body clothing?: None 6 Click Score: 24   End of Session Nurse Communication: Mobility status  Activity Tolerance: Patient tolerated treatment well Patient left: in bed;with call bell/phone within reach  OT Visit Diagnosis: Pain Pain - Right/Left: Right Pain - part of body:  (elbow)                Time: 5374-8270 OT Time Calculation (min): 14 min Charges:  OT General Charges $OT Visit: 1 Visit OT Evaluation $OT Eval Low Complexity: 1 Low  Maria Foster OT OT pager: [phone]  Maria Foster 07/13/2021, 10:09 AM

## 2021-07-13 NOTE — Progress Notes (Signed)
Patient ID: Maria Foster, female   DOB: 13-Mar-1956, 65 y.o.   MRN: [id number] Subjective: 2 Days Post-Op Procedure(s) (LRB): INCISION AND DRAINAGE RIGHT ELBOW (N/A)    Patient reports pain as mild.  Objective:   VITALS:   Vitals:   07/13/21 0607 07/13/21 1149  BP: (!) 175/86 (!) 189/66  Pulse: 68 (!) 59  Resp: 18 17  Temp: 98.5 F (36.9 C) 97.6 F (36.4 C)  SpO2: 97% 100%    Neurovascular intact Incision: moderate drainage Her penrose drain was removed Right elbow was redressed  LABS Recent Labs    07/10/21 2354 07/12/21 0458 07/13/21 0436  HGB 15.0 12.0 13.8  HCT 48.0* 38.4 42.8  WBC 8.3 8.2 11.5*  PLT 245 192 218    Recent Labs    07/10/21 2354 07/12/21 0458 07/13/21 0436  NA 141 135 137  K 3.8 4.0 3.9  BUN 23 28* 21  CREATININE 1.05* 1.15* 0.85  GLUCOSE 103* 208* 135*    No results for input(s): LABPT, INR in the last 72 hours.   Assessment/Plan: 2 Days Post-Op Procedure(s) (LRB): INCISION AND DRAINAGE RIGHT ELBOW (N/A)   Plan: Dressing changed today and her drain removed Home today on PO antibiotics likely Doxycycline  Daily dressing changes RTC next week

## 2021-07-14 ENCOUNTER — Inpatient Hospital Stay: Payer: Self-pay

## 2021-07-14 DIAGNOSIS — M05741 Rheumatoid arthritis with rheumatoid factor of right hand without organ or systems involvement: Secondary | ICD-10-CM | POA: Diagnosis not present

## 2021-07-14 DIAGNOSIS — M009 Pyogenic arthritis, unspecified: Secondary | ICD-10-CM

## 2021-07-14 DIAGNOSIS — M05742 Rheumatoid arthritis with rheumatoid factor of left hand without organ or systems involvement: Secondary | ICD-10-CM

## 2021-07-14 LAB — CBC WITH DIFFERENTIAL/PLATELET
Abs Immature Granulocytes: 0.02 10*3/uL (ref 0.00–0.07)
Basophils Absolute: 0.1 10*3/uL (ref 0.0–0.1)
Basophils Relative: 1 %
Eosinophils Absolute: 0.2 10*3/uL (ref 0.0–0.5)
Eosinophils Relative: 2 %
HCT: 40.7 % (ref 36.0–46.0)
Hemoglobin: 13 g/dL (ref 12.0–15.0)
Immature Granulocytes: 0 %
Lymphocytes Relative: 16 %
Lymphs Abs: 1.3 10*3/uL (ref 0.7–4.0)
MCH: 28.1 pg (ref 26.0–34.0)
MCHC: 31.9 g/dL (ref 30.0–36.0)
MCV: 87.9 fL (ref 80.0–100.0)
Monocytes Absolute: 0.6 10*3/uL (ref 0.1–1.0)
Monocytes Relative: 7 %
Neutro Abs: 6.3 10*3/uL (ref 1.7–7.7)
Neutrophils Relative %: 74 %
Platelets: 191 10*3/uL (ref 150–400)
RBC: 4.63 MIL/uL (ref 3.87–5.11)
RDW: 16 % — ABNORMAL HIGH (ref 11.5–15.5)
WBC: 8.4 10*3/uL (ref 4.0–10.5)
nRBC: 0 % (ref 0.0–0.2)

## 2021-07-14 LAB — COMPREHENSIVE METABOLIC PANEL
ALT: 12 U/L (ref 0–44)
AST: 14 U/L — ABNORMAL LOW (ref 15–41)
Albumin: 2.8 g/dL — ABNORMAL LOW (ref 3.5–5.0)
Alkaline Phosphatase: 97 U/L (ref 38–126)
Anion gap: 7 (ref 5–15)
BUN: 19 mg/dL (ref 8–23)
CO2: 24 mmol/L (ref 22–32)
Calcium: 8.5 mg/dL — ABNORMAL LOW (ref 8.9–10.3)
Chloride: 109 mmol/L (ref 98–111)
Creatinine, Ser: 1.05 mg/dL — ABNORMAL HIGH (ref 0.44–1.00)
GFR, Estimated: 59 mL/min — ABNORMAL LOW (ref >60)
Glucose, Bld: 167 mg/dL — ABNORMAL HIGH (ref 70–99)
Potassium: 3.4 mmol/L — ABNORMAL LOW (ref 3.5–5.1)
Sodium: 140 mmol/L (ref 135–145)
Total Bilirubin: 0.7 mg/dL (ref 0.3–1.2)
Total Protein: 6.5 g/dL (ref 6.5–8.1)

## 2021-07-14 LAB — MAGNESIUM: Magnesium: 1.9 mg/dL (ref 1.7–2.4)

## 2021-07-14 LAB — GLUCOSE, CAPILLARY
Glucose-Capillary: 166 mg/dL — ABNORMAL HIGH (ref 70–99)
Glucose-Capillary: 128 mg/dL — ABNORMAL HIGH (ref 70–99)
Glucose-Capillary: 157 mg/dL — ABNORMAL HIGH (ref 70–99)
Glucose-Capillary: 261 mg/dL — ABNORMAL HIGH (ref 70–99)

## 2021-07-14 LAB — PHOSPHORUS: Phosphorus: 2.5 mg/dL (ref 2.5–4.6)

## 2021-07-14 MED ORDER — HYDROCODONE-ACETAMINOPHEN 5-325 MG PO TABS: 1.0000 | ORAL_TABLET | Freq: Four times a day (QID) | ORAL | 0 refills | Status: DC | PRN

## 2021-07-14 MED ORDER — CEFAZOLIN SODIUM-DEXTROSE 2-4 GM/100ML-% IV SOLN
2.0000 g | Freq: Three times a day (TID) | INTRAVENOUS | Status: DC
  Administered 2021-07-14 – 2021-07-15 (×4): 2 g via INTRAVENOUS
  Filled 2021-07-14 (×4): qty 100

## 2021-07-14 MED ORDER — SODIUM CHLORIDE 0.9% FLUSH
10.0000 mL | INTRAVENOUS | Status: DC | PRN
  Administered 2021-07-15: 10 mL

## 2021-07-14 MED ORDER — POLYETHYLENE GLYCOL 3350 17 G PO PACK: 17.0000 g | PACK | Freq: Every day | ORAL | 0 refills | Status: DC | PRN

## 2021-07-14 MED ORDER — CHLORHEXIDINE GLUCONATE CLOTH 2 % EX PADS
6.0000 | MEDICATED_PAD | Freq: Every day | CUTANEOUS | Status: DC
  Administered 2021-07-15: 6 via TOPICAL

## 2021-07-14 MED ORDER — POTASSIUM CHLORIDE CRYS ER 20 MEQ PO TBCR
40.0000 meq | EXTENDED_RELEASE_TABLET | Freq: Two times a day (BID) | ORAL | Status: AC
  Administered 2021-07-14 (×2): 40 meq via ORAL
  Filled 2021-07-14 (×2): qty 2

## 2021-07-14 NOTE — Progress Notes (Signed)
Cultures grew staph aureus. If sensitivities show MRSA, will send home on 2 weeks Doxy 100 BID. If not, we will send with Keflex TID x 2 weeks.  I have placed instructions for dressing changes/wound care in the discharge instructions.   From an orthopaedic standpoint, she will be ready for discharge when sensitivities are in.   Continue NWB RUE.   Follow up in our office next week.   Dennie Bible, PA-C Orthopedic Surgery [facility] [place] [phone]

## 2021-07-14 NOTE — Progress Notes (Signed)
Physical Therapy Treatment Patient Details Name: Maria Foster MRN: [id number] DOB: [birth year] Today's Date: 07/14/2021    History of Present Illness 65 yo female admitted with R septic elbow s/p I&D and excision of rheumatoid nodules. Hx of RA, DM, CKD, diabetic neuropathy    PT Comments    Pt is progressing toward acute PT goals with increased ambulation distance this session . Pt ambulated ~231ft with MIN guard-supervision. Pt continues to present with balance deficits during ambulation displaying LOB with staggering/scissoring steps x2 with MIN A from therapist to assist with correction. PT educated pt on use of cane upon d/c at this time in order to maximize safety, pt verbalized understanding. Pt will benefit from continued skilled PT to increase independence and maximize safety with mobility.     Follow Up Recommendations  Home health PT;Supervision/Assistance - 24 hour     Equipment Recommendations       Recommendations for Other Services       Precautions / Restrictions Precautions Precautions: Fall Precaution Comments: dressing/ace wrap in place R elbow/forearm Restrictions Weight Bearing Restrictions: Yes RUE Weight Bearing: Non weight bearing Other Position/Activity Restrictions: NWB Rt elbow    Mobility  Bed Mobility Overal bed mobility: Modified Independent             General bed mobility comments: Pt able to verbalize to PT that she is not allowed to put weight on Rt elbow when prompted. pt with use of UE on bedrail to assist with supine to sit completion    Transfers Overall transfer level: Independent Equipment used: None Transfers: Sit to/from Stand Sit to Stand: Independent         General transfer comment: Independent with transfer from EOB without use of assistive device  Ambulation/Gait Ambulation/Gait assistance: Min guard;Supervision Gait Distance (Feet): 220 Feet Assistive device: None Gait Pattern/deviations: Step-through  pattern;Decreased stride length;Narrow base of support Gait velocity: decr   General Gait Details: Pt politely deferred use of cane this session and requesting to try ambulation without today. Pt with LOB x2 displaying intermittent stagger, drifting, and scissoring step, MIN A from PT to assist with correction and cues provided for maintenance of straight path. PT educated pt on use of cane upon d/c at this time to maximize safety while presenting with current balance deficits, pt agreeable and verbalized understanding of maintaining safety.   Stairs             Wheelchair Mobility    Modified Rankin (Stroke Patients Only)       Balance                                            Cognition Arousal/Alertness: Awake/alert Behavior During Therapy: WFL for tasks assessed/performed Overall Cognitive Status: Within Functional Limits for tasks assessed                                        Exercises      General Comments General comments (skin integrity, edema, etc.): 5x sit to stand: 27.22s      Pertinent Vitals/Pain Pain Assessment: 0-10 Pain Score: 2  Pain Location: R UE/elbow Pain Descriptors / Indicators: Sore Pain Intervention(s): Limited activity within patient's tolerance;Monitored during session;Repositioned    Home Living  Prior Function            PT Goals (current goals can now be found in the care plan section) Acute Rehab PT Goals Patient Stated Goal: be able to be independent PT Goal Formulation: With patient Time For Goal Achievement: 07/26/21 Potential to Achieve Goals: Good Progress towards PT goals: Progressing toward goals    Frequency    Min 3X/week      PT Plan Current plan remains appropriate    Co-evaluation              AM-PAC PT "6 Clicks" Mobility   Outcome Measure  Help needed turning from your back to your side while in a flat bed without using  bedrails?: None Help needed moving from lying on your back to sitting on the side of a flat bed without using bedrails?: None Help needed moving to and from a bed to a chair (including a wheelchair)?: A Little Help needed standing up from a chair using your arms (e.g., wheelchair or bedside chair)?: A Little Help needed to walk in hospital room?: A Little Help needed climbing 3-5 steps with a railing? : A Little 6 Click Score: 20    End of Session Equipment Utilized During Treatment: Gait belt Activity Tolerance: Patient tolerated treatment well Patient left: in bed;with call bell/phone within reach Nurse Communication: Mobility status PT Visit Diagnosis: Unsteadiness on feet (R26.81);Muscle weakness (generalized) (M62.81)     Time: 1250-1306 PT Time Calculation (min) (ACUTE ONLY): 16 min  Charges:  $Gait Training: 8-22 mins                     Lyman Speller PT, DPT  Acute Rehabilitation Services  Office [phone]   07/14/2021, 2:19 PM

## 2021-07-14 NOTE — Consult Note (Signed)
[name] for Infectious Disease    Date of Admission:  07/10/2021     Reason for Consult: septic arthritis native right elbow    Referring Provider: Alfredia Ferguson      Lines:  Peripheral lines  Abx: 8/23-c vanc  8/23-25 cefepime        Assessment: Right elbow septic arthritis; cx mssa (s tetra/bactrim)  RA  65 yo female pmh RA on plaquenil, dm/htn/hlp, admitted 8/22 for right elbow septic arthritis in setting of steroid injection  She is s/p I&D 8/23. Cx mssa. Bcx negative Reviewed xray and op notes. No concern for osteomyelitis  Would treat for 4 weeks abx with id f/u    Plan: Stop vanc Start cefazolin 2 gram iv q8hours Ok to place picc Plan 4 weeks from 8/24-9/21 Needs weekly cbc, cmp, crp and fax to id clinic Discussed with primary team     OPAT Orders Discharge antibiotics to be given via PICC line Discharge antibiotics: cefazolin 2g iv q8hours Duration: 4 weeks End Date: 08/09/21  [facility] Care Per Protocol:  Home health RN for IV administration and teaching; PICC line care and labs.    Labs weekly while on IV antibiotics: _x_ CBC with differential __ BMP _x_ CMP _x_ CRP __ ESR __ Vancomycin trough __ CK  _x_ Please pull PIC at completion of IV antibiotics __ Please leave PIC in place until doctor has seen patient or been notified  Fax weekly labs to [phone]  Clinic Follow Up Appt: 9/07 @ 4pm with dr Gale Journey  @  [facility] [address], [place], [place] [address] Phone: [phone]   I spent 60 minute reviewing data/chart, and coordinating care and >50% direct face to face time providing counseling/discussing diagnostics/treatment plan with patient    ------------------------------------------------ Principal Problem:   Septic arthritis of elbow, right ([place]) Active Problems:   Diabetes ([place])   Rheumatoid arthritis involving both hands ([place])   Essential hypertension, benign   Hyperlipidemia associated with  type 2 diabetes mellitus ([place])   Tobacco use   Prolonged QT interval   Stage 3a chronic kidney disease ([place])   Septic olecranon bursitis of right elbow    HPI: Maria Foster is a 65 y.o. female hx RA on plaquenil/humira, dm2, admitted for right elbow septic arthritis  Patient has been getting steroid injection right elbow for RA, with last shot about 2 weeks pta. A week after the last shot, developed progressive pain/swelling/redness and also discharge from the right elbow   Her pcp reviewed picture sent and sends to ed She was afebrile on admission Ortho saw and performed I&D 8/23; cx mssa Initial bcx negative  Patient's pain better after procedure Tolerating bsAbx No n/v/diarrhea     Family History  Problem Relation Age of Onset   Diabetes Mother    Hypertension Mother    Heart Problems Sister    Amblyopia Neg Hx    Blindness Neg Hx    Cataracts Neg Hx    Glaucoma Neg Hx    Macular degeneration Neg Hx    Retinal detachment Neg Hx    Strabismus Neg Hx    Retinitis pigmentosa Neg Hx     Social History   Tobacco Use   Smoking status: Some Days    Packs/day: 0.50    Years: 15.00    Pack years: 7.50    Types: Cigarettes    Last attempt to quit: 11/19/2020    Years  since quitting: 0.6   Smokeless tobacco: Never  Vaping Use   Vaping Use: Never used  Substance Use Topics   Alcohol use: Not Currently   Drug use: No    Allergies  Allergen Reactions   Metformin And Related Other (See Comments)    Kidney injury, GI upset, diarrhea    Review of Systems: ROS All Other ROS was negative, except mentioned above   Past Medical History:  Diagnosis Date   Arthritis    Cataract    Diabetes mellitus type 2 with complications ([place])    diagnosed probably 2012 per patient   Diabetic retinopathy ([place])    Hypertension    Rheumatoid arthritis ([place])        Scheduled Meds:  aspirin EC  81 mg Oral Q M,W,F   atorvastatin  40 mg Oral Daily   docusate sodium  100  mg Oral BID   hydrALAZINE  50 mg Oral Q8H   insulin aspart  0-15 Units Subcutaneous TID WC   insulin aspart  0-5 Units Subcutaneous QHS   isosorbide mononitrate  60 mg Oral Daily   metoprolol succinate  50 mg Oral Daily   mupirocin ointment  1 application Nasal BID   potassium chloride  40 mEq Oral BID   sacubitril-valsartan  1 tablet Oral BID   sodium chloride flush  3 mL Intravenous Q12H   spironolactone  25 mg Oral Daily   Continuous Infusions:  methocarbamol (ROBAXIN) IV     vancomycin 750 mg (07/14/21 1147)   PRN Meds:.acetaminophen, bisacodyl, bisacodyl, diphenhydrAMINE, hydrALAZINE, HYDROcodone-acetaminophen, HYDROcodone-acetaminophen, menthol-cetylpyridinium **OR** phenol, methocarbamol **OR** methocarbamol (ROBAXIN) IV, metoCLOPramide **OR** metoCLOPramide (REGLAN) injection, morphine injection, polyethylene glycol   OBJECTIVE: Blood pressure (!) 186/82, pulse 66, temperature (!) 97.5 F (36.4 C), temperature source Oral, resp. rate 17, height $RemoveBe'5\' 3"'iJNmyfNmM$  (1.6 m), weight 59.9 kg, SpO2 99 %.  Physical Exam General/constitutional: no distress, pleasant HEENT: Normocephalic, PER, Conj Clear, EOMI, Oropharynx clear Neck supple CV: rrr no mrg Lungs: clear to auscultation, normal respiratory effort Abd: Soft, Nontender Ext: no edema Skin: No Rash Neuro: nonfocal MSK: right elbow dressing c/d    Lab Results Lab Results  Component Value Date   WBC 8.4 07/14/2021   HGB 13.0 07/14/2021   HCT 40.7 07/14/2021   MCV 87.9 07/14/2021   PLT 191 07/14/2021    Lab Results  Component Value Date   CREATININE 1.05 (H) 07/14/2021   BUN 19 07/14/2021   NA 140 07/14/2021   K 3.4 (L) 07/14/2021   CL 109 07/14/2021   CO2 24 07/14/2021    Lab Results  Component Value Date   ALT 12 07/14/2021   AST 14 (L) 07/14/2021   ALKPHOS 97 07/14/2021   BILITOT 0.7 07/14/2021      Microbiology: Recent Results (from the past 240 hour(s))  Blood culture (routine x 2)     Status: None  (Preliminary result)   Collection Time: 07/10/21 11:54 PM   Specimen: Left Antecubital; Blood  Result Value Ref Range Status   Specimen Description   Final    LEFT ANTECUBITAL Performed at [facility], [place] [address], [place], [place] [address]    Special Requests   Final    BOTTLES DRAWN AEROBIC AND ANAEROBIC Blood Culture adequate volume Performed at [facility] [address], [place], [place] [address]    Culture   Final    NO GROWTH 3 DAYS Performed at [facility], [place],  [place] [address]    Report Status PENDING  Incomplete  Blood culture (routine x 2)     Status: None (Preliminary result)   Collection Time: 07/10/21 11:54 PM   Specimen: Right Antecubital; Blood  Result Value Ref Range Status   Specimen Description   Final    RIGHT ANTECUBITAL Performed at [facility] [address], [place], [place] [address]    Special Requests   Final    BOTTLES DRAWN AEROBIC AND ANAEROBIC Blood Culture adequate volume Performed at [facility] [address] [place], [place] [address]    Culture   Final    NO GROWTH 3 DAYS Performed at [facility], [place] [address], [place], [place] [address]    Report Status PENDING  Incomplete  Resp Panel by RT-PCR (Flu A&B, Covid) Nasopharyngeal Swab     Status: None   Collection Time: 07/11/21  7:54 AM   Specimen: Nasopharyngeal Swab; Nasopharyngeal(NP) swabs in vial transport medium  Result Value Ref Range Status   SARS Coronavirus 2 by RT PCR NEGATIVE NEGATIVE Final    Comment: (NOTE) SARS-CoV-2 target nucleic acids are NOT DETECTED.  The SARS-CoV-2 RNA is generally detectable in upper respiratory specimens during the acute phase of infection. The lowest concentration of SARS-CoV-2 viral copies this assay can detect is 138 copies/mL. A negative result does not preclude SARS-Cov-2 infection and should not be used as the sole  basis for treatment or other patient management decisions. A negative result may occur with  improper specimen collection/handling, submission of specimen other than nasopharyngeal swab, presence of viral mutation(s) within the areas targeted by this assay, and inadequate number of viral copies(<138 copies/mL). A negative result must be combined with clinical observations, patient history, and epidemiological information. The expected result is Negative.  Fact Sheet for Patients:  EntrepreneurPulse.com.au  Fact Sheet for Healthcare Providers:  IncredibleEmployment.be  This test is no t yet approved or cleared by the [place] FDA and  has been authorized for detection and/or diagnosis of SARS-CoV-2 by FDA under an Emergency Use Authorization (EUA). This EUA will remain  in effect (meaning this test can be used) for the duration of the COVID-19 declaration under Section 564(b)(1) of the Act, 21 U.S.C.section 360bbb-3(b)(1), unless the authorization is terminated  or revoked sooner.       Influenza A by PCR NEGATIVE NEGATIVE Final   Influenza B by PCR NEGATIVE NEGATIVE Final    Comment: (NOTE) The Xpert Xpress SARS-CoV-2/FLU/RSV plus assay is intended as an aid in the diagnosis of influenza from Nasopharyngeal swab specimens and should not be used as a sole basis for treatment. Nasal washings and aspirates are unacceptable for Xpert Xpress SARS-CoV-2/FLU/RSV testing.  Fact Sheet for Patients: EntrepreneurPulse.com.au  Fact Sheet for Healthcare Providers: IncredibleEmployment.be  This test is not yet approved or cleared by the [place] FDA and has been authorized for detection and/or diagnosis of SARS-CoV-2 by FDA under an Emergency Use Authorization (EUA). This EUA will remain in effect (meaning this test can be used) for the duration of the COVID-19 declaration under Section 564(b)(1) of the Act,  21 U.S.C. section 360bbb-3(b)(1), unless the authorization is terminated or revoked.  Performed at [facility], [place] [address] [place], [place] [address]   Surgical PCR screen     Status: Abnormal   Collection Time: 07/11/21  9:14 AM   Specimen: Nasal Mucosa; Nasal Swab  Result Value Ref Range Status   MRSA, PCR NEGATIVE NEGATIVE  Final   Staphylococcus aureus POSITIVE (A) NEGATIVE Final    Comment: (NOTE) The Xpert SA Assay (FDA approved for NASAL specimens in patients 55 years of age and older), is one component of a comprehensive surveillance program. It is not intended to diagnose infection nor to guide or monitor treatment. Performed at [facility], [place] [address] [place], [place] [address]   Aerobic/Anaerobic Culture w Gram Stain (surgical/deep wound)     Status: None (Preliminary result)   Collection Time: 07/11/21  4:32 PM   Specimen: PATH Other; Tissue  Result Value Ref Range Status   Specimen Description   Final    TISSUE RIGHT OLECRANON BURSA Performed at [facility] [address] [place], [place] [address]    Special Requests   Final    NONE Performed at [facility], [place] [address], [place], [place] [address]    Gram Stain   Final    FEW WBC PRESENT, PREDOMINANTLY PMN NO ORGANISMS SEEN Performed at [facility], [place] [address] [place], [place] [address]    Culture ABUNDANT STAPHYLOCOCCUS AUREUS  Final   Report Status PENDING  Incomplete   Organism ID, Bacteria STAPHYLOCOCCUS AUREUS  Final      Susceptibility   Staphylococcus aureus - MIC*    CIPROFLOXACIN <=0.5 SENSITIVE Sensitive     ERYTHROMYCIN >=8 RESISTANT Resistant     GENTAMICIN <=0.5 SENSITIVE Sensitive     OXACILLIN <=0.25 SENSITIVE Sensitive     TETRACYCLINE <=1 SENSITIVE Sensitive     VANCOMYCIN <=0.5 SENSITIVE Sensitive     TRIMETH/SULFA <=10 SENSITIVE Sensitive     CLINDAMYCIN RESISTANT Resistant      RIFAMPIN <=0.5 SENSITIVE Sensitive     Inducible Clindamycin POSITIVE Resistant     * ABUNDANT STAPHYLOCOCCUS AUREUS  Aerobic/Anaerobic Culture w Gram Stain (surgical/deep wound)     Status: None (Preliminary result)   Collection Time: 07/11/21  4:36 PM   Specimen: PATH Other; Tissue  Result Value Ref Range Status   Specimen Description   Final    TISSUE RIGHT OLECRANON BURSA FLUID Performed at [facility] [address] [place], [place] [address]    Special Requests SWAB  Final   Gram Stain   Final    FEW WBC PRESENT, PREDOMINANTLY PMN FEW GRAM POSITIVE COCCI IN PAIRS Performed at [facility], [place] [address] [place], [place] [address]    Culture ABUNDANT STAPHYLOCOCCUS AUREUS  Final   Report Status PENDING  Incomplete   Organism ID, Bacteria STAPHYLOCOCCUS AUREUS  Final      Susceptibility   Staphylococcus aureus - MIC*    CIPROFLOXACIN <=0.5 SENSITIVE Sensitive     ERYTHROMYCIN >=8 RESISTANT Resistant     GENTAMICIN <=0.5 SENSITIVE Sensitive     OXACILLIN 0.5 SENSITIVE Sensitive     TETRACYCLINE <=1 SENSITIVE Sensitive     VANCOMYCIN 1 SENSITIVE Sensitive     TRIMETH/SULFA <=10 SENSITIVE Sensitive     CLINDAMYCIN RESISTANT Resistant     RIFAMPIN <=0.5 SENSITIVE Sensitive     Inducible Clindamycin POSITIVE Resistant     * ABUNDANT STAPHYLOCOCCUS AUREUS     Serology:    Imaging: If present, new imagings (plain films, ct scans, and mri) have been personally visualized and interpreted; radiology reports have been reviewed. Decision making incorporated into the Impression / Recommendations.  8/22 xray right elbow 1. Soft tissue swelling over the dorsum of the elbow with focal outpouching over the proximal radius. Targeted  ultrasound may be useful to assess for drainable fluid collection. 2. No acute osseous abnormality.  Jabier Mutton, [facility] [place] [phone] pager    07/14/2021, 1:22  PM

## 2021-07-14 NOTE — Progress Notes (Signed)
Peripherally Inserted Central Catheter Placement  The IV Nurse has discussed with the patient and/or persons authorized to consent for the patient, the purpose of this procedure and the potential benefits and risks involved with this procedure.  The benefits include less needle sticks, lab draws from the catheter, and the patient may be discharged home with the catheter. Risks include, but not limited to, infection, bleeding, blood clot (thrombus formation), and puncture of an artery; nerve damage and irregular heartbeat and possibility to perform a PICC exchange if needed/ordered by physician.  Alternatives to this procedure were also discussed.  Bard Power PICC patient education guide, fact sheet on infection prevention and patient information card has been provided to patient /or left at bedside.    PICC Placement Documentation  PICC Single Lumen 07/14/21 Left Cephalic 42 cm 0 cm (Active)  Indication for Insertion or Continuance of Line Home intravenous therapies (PICC only) 07/14/21 1731  Exposed Catheter (cm) 0 cm 07/14/21 1731  Site Assessment Clean;Dry;Intact 07/14/21 1731  Line Status Flushed;Saline locked;Blood return noted 07/14/21 1731  Dressing Type Transparent 07/14/21 1731  Dressing Status Clean;Dry;Intact 07/14/21 1731  Antimicrobial disc in place? Yes 07/14/21 1731  Safety Lock Not Applicable 07/14/21 1731  Line Care Connections checked and tightened 07/14/21 1731  Line Adjustment (NICU/IV Team Only) No 07/14/21 1731  Dressing Intervention New dressing 07/14/21 1731  Dressing Change Due 07/21/21 07/14/21 1731       Nafis Farnan, Lajean Manes 07/14/2021, 5:32 PM

## 2021-07-14 NOTE — Progress Notes (Signed)
PROGRESS NOTE    Maria Foster  ZOX:[id number] DOB: 07/19/1956 DOA: 07/10/2021 PCP: Carlena Hurl, PA-C  Brief Narrative:  The patient is a 65 year old African-American female with a past medical history significant for but not limited to diabetes mellitus type 2, hypertension, rheumatoid arthritis on Plaquenil, history of chronic mild CHF, CKD stage IIIa, as well as other comorbidities who presented with a right elbow infection following a steroid injection a week ago.  She saw Dr. Benjamine Mola last Monday and gave her steroid shot in her arm because it is really sore.  On Sunday a.m. she woke up in a little knot on her arm that burst and there is drainage.  She sent a picture to Dr. Benjamine Mola and encouraged to come to the ER for concern for infection.  She denies any fevers but has had rheumatoid arthritis for over 10 years and was recently started on Humira as well.  She continues to have symptoms primarily in her bilateral hand and right elbow for her rheumatoid arthritis.  Because she is having purulent drainage from the elbow she presented to the ED and ED PA spoke with Dr. Alvan Dame from orthopedics who made her n.p.o. and took her to the operating room and she is status post day 2 of an excisional and nonexcisional debridement of the right elbow olecranon and excision of the right elbow rheumatoid nodules.  Wound cultures are growing staph aureus but will await final sensitivities prior to discharging home and shows MSSA sensitivities to cefazolin.  ID was consulted and recommending continued IV cefazolin for 4 weeks and having PICC line placed with OPAT orders in place.  Once her PICC line is placed and she is gotten some training as well as home health accepting her for her home infusions she can be discharged safely  Assessment & Plan:   Principal Problem:   Septic arthritis of elbow, right ([place]) Active Problems:   Diabetes ([place])   Rheumatoid arthritis involving both hands ([place])   Essential  hypertension, benign   Hyperlipidemia associated with type 2 diabetes mellitus ([place])   Tobacco use   Prolonged QT interval   Stage 3a chronic kidney disease (HCC)   Septic olecranon bursitis of right elbow  Acute Septic Olecranon Bursitis of the right elbow -Rheumatoid arthritis and received a steroid injection of the right elbow 07/03/2021 and now has septic arthritis -She was admitted to [facility] as inpatient and will continue inpatient hospitalization -Orthopedic surgery was consulted and they were planning on taking the patient for the OR for a washout procedure and this was done yesterday and she is postoperative day 3 of an excisional and nonexcisional debridement of the right elbow olecranon and excision of the right elbow rheumatoid nodules -We will continue empiric antibiotics with IV vancomycin given her immunocompromised state and given that her MRSA PCR was positive for Staph aureus as well as a culture growing staph aureus; will discontinue IV cefepime -Culture of the right olecranon bursa showing:  Susceptibility   Staphylococcus aureus    MIC    CIPROFLOXACIN <=0.5 SENSI... Sensitive    CLINDAMYCIN RESISTANT  Resistant    ERYTHROMYCIN >=8 RESISTANT  Resistant    GENTAMICIN <=0.5 SENSI... Sensitive    Inducible Clindamycin POSITIVE  Resistant    OXACILLIN <=0.25 SENS... Sensitive    RIFAMPIN <=0.5 SENSI... Sensitive    TETRACYCLINE <=1 SENSITIVE  Sensitive    TRIMETH/SULFA <=10 SENSIT... Sensitive    VANCOMYCIN <=0.5 SENSI... Sensitive    -She does not appear  to be systemically ill and there is currently no concern for sepsis -She is not febrile or having any leukocytosis -We will continue empiric antibiotics as above and de-escalate as necessary -ESR was 58 -IV fluid hydration has now stopped -Orthopedic surgery evaluating and recommending nonweightbearing in the right upper extremity and recommending discussing with a hand team regarding for home antibiotic regimen and  today her Penrose drain was removed -We will follow up on blood cultures x2 and currently only showing no growth to date 2 days -Continue with pain control with p.o. hydrocodone-acetaminophen 5-325 mg and 7.5-325 mg every 4 hours as needed for moderate and severe pain and continue with morphine 0.52-1 mg every 2 hours as needed severe pain if uncontrolled with p.o. analgesics -Also continue methocarbamol 500 mg p.o./IV every 6 as needed for muscle spasms -Orthopedics changed her dressing today and her drain was removed and recommending home on oral antibiotics however after reviewing her sensitivities ID was consulted and recommending 4 weeks of IV cefazolin given her infection with OPAT orders placed and ID recommending PICC line placement and this is still pending to be done -Patient is accepted for home health infusion services and PICC line is placed as well as training is done she can be discharged home on 4 weeks of cefazolin and she will need to follow-up with Dr. Doug Sou in outpatient setting after 08/09/2021  Rheumatoid Arthritis -She has fairly advanced disease based on clinical appearance -She was last seen by Dr. Benjamine Mola on 8/15 and has seropositive RA and chronic tophaceous gout -She was recently started on Plaquenil with possible mild improvement -She had the R olecranon bursa injected at her last visit likely seeding infection into her olecranon bursa -Will hold Plaquenil in the setting of active infection -Once source control is obtained, it is likely reasonable to resume Plaquenil but will defer to PCP   Chronic combined CHF -Severe MR, EF 50-55% on 6/29 echo -Appears to be compensated at this time -Continue aspirin 81 mg p.o. every Monday Wednesday Friday as well as Imdur 60 mg p.o. daily and will also continue hydralazine 50 mg every 8 hours -Continue spironolactone 25 mg p.o. daily but hold Lasix for now and stop fluids after 12 more hours -Continue Entresto 97-103 1 tab p.o. twice  daily as well as metoprolol succinate 50 mg p.o. daily -Strict I's and O's and daily weights: She is +3.073 Liters  -Continue monitor for signs and symptoms of volume overload -Repeat chest x-ray in a.m.   Diabetes mellitus type 2 -6/16 A1c was 7.0 -Currently holding Tradjenta -Cover with moderate-scale SSI before meals and at bedtime -CBGs ranging from 102-284   Leukocytosis -Mild and Reactive -WBC went from 8.2 -> 11.5 is now improved down to 8.4 -C/w Abx as Above -Continue to Monitor and Trend and Repeat CBC in the AM   HTN -Continue hydralazine 50 mg p.o. every 8, spironolactone 25 mg p.o. daily, Entresto 97-1031 tab p.o. twice daily, metoprolol succinate 50 mg p.o. daily -Continue monitor blood pressures per protocol -Last blood pressure reading was elevated at 186/82   HLD -Continue Atorvastatin 40 mg daily   Prolonged QTc -Likely associated with mild dehydration, infection -Will attempt to avoid QT-prolonging medications such as PPI, nausea meds, SSRIs -Repeat EKG the day before yesterday showed a qTC of 493   Stage 3a CKD Metabolic Acidosis  -Appears to be chronic/stable and patient's BUNs/creatinine went from 23/1.05 and is now 28/1.15 -> 21/0.85 and now trended up to 19/1.05 -Patient's  metabolic acidosis is improved and now she had a CO2 24, anion gap of 7, chloride level of 109 -Hold Farxiga for now but will continue the rest of her medications as above -Avoid further nephrotoxic medications, contrast dyes, hypotension and renally adjust medications -Continue monitor and trend renal function carefully and repeat CMP in a.m.   Tobacco Abuse -No longer smoking regularly but continues to smoke periodically -Ongoing total cessation should be encouraged  DVT prophylaxis: SCDs Code Status: FULL CODE Family Communication: No family present at bedside  Disposition Plan: Pending further clinical improvement and clearance by orthopedic surgery; currently awaiting wound  culture results and anticipating discharging home in the next 24 to 48 hours  Status is: Inpatient  Remains inpatient appropriate because:Unsafe d/c plan, IV treatments appropriate due to intensity of illness or inability to take PO, and Inpatient level of care appropriate due to severity of illness  Dispo: The patient is from: Home              Anticipated d/c is to: Home              Patient currently is not medically stable to d/c.   Difficult to place patient No  Consultants:  Orthopedic Surgery Dr. Paralee Cancel Orthopedic surgery Dr. Prudencio Pair  Procedures:  PROCEDURES: 1.  Excisional and non-excisional debridement of right elbow olecranon. 2.  Excision of right elbow rheumatoid nodules.  Antimicrobials: Anti-infectives (From admission, onward)    Start     Dose/Rate Route Frequency Ordered Stop   07/14/21 1400  ceFAZolin (ANCEF) IVPB 2g/100 mL premix        2 g 200 mL/hr over 30 Minutes Intravenous Every 8 hours 07/14/21 1328     07/12/21 0600  ceFAZolin (ANCEF) IVPB 2g/100 mL premix  Status:  Discontinued        2 g 200 mL/hr over 30 Minutes Intravenous On call to O.R. 07/11/21 1736 07/11/21 1744   07/11/21 1100  ceFEPIme (MAXIPIME) 2 g in sodium chloride 0.9 % 100 mL IVPB  Status:  Discontinued        2 g 200 mL/hr over 30 Minutes Intravenous Every 12 hours 07/11/21 1001 07/13/21 1338   07/11/21 1100  vancomycin (VANCOREADY) IVPB 750 mg/150 mL  Status:  Discontinued        750 mg 150 mL/hr over 60 Minutes Intravenous Every 24 hours 07/11/21 1001 07/14/21 1328   07/11/21 0100  vancomycin (VANCOCIN) IVPB 1000 mg/200 mL premix        1,000 mg 200 mL/hr over 60 Minutes Intravenous  Once 07/11/21 0056 07/11/21 0336   07/11/21 0100  piperacillin-tazobactam (ZOSYN) IVPB 3.375 g        3.375 g 100 mL/hr over 30 Minutes Intravenous  Once 07/11/21 0056 07/11/21 0336        Subjective: Seen and examined at bedside and states that she is feeling well and in no acute distress.   Wants to go home to her dog.  Denying chest pain or shortness of breath.  After her sensitivities came back ID was consulted and they are recommending IV cefazolin for 4 weeks.  Patient was informed about this and she is appreciative of the care.  No concerns or complaints at this time.  Objective: Vitals:   07/13/21 2200 07/14/21 0606 07/14/21 0610 07/14/21 1315  BP: (!) 187/73 (!) 158/85 (!) 158/85 (!) 186/82  Pulse: 70  66 66  Resp: 17   17  Temp: 98.2 F (36.8 C)  98.6  F (37 C) (!) 97.5 F (36.4 C)  TempSrc: Oral  Oral Oral  SpO2:   100% 99%  Weight:      Height:        Intake/Output Summary (Last 24 hours) at 07/14/2021 1413 Last data filed at 07/14/2021 0600 Gross per 24 hour  Intake 480 ml  Output --  Net 480 ml    Filed Weights   07/10/21 1759  Weight: 59.9 kg   Examination: Physical Exam:  Constitutional: WN/WD thin African-American female currently in no acute distress appears calm and comfortable Eyes: Lids and conjunctivae normal, sclerae anicteric  ENMT: External Ears, Nose appear normal. Grossly normal hearing. Mucous membranes are moist.  Neck: Appears normal, supple, no cervical masses, normal ROM, no appreciable thyromegaly; No appreciable JVD Respiratory: Diminished to auscultation bilaterally with coarse breath sounds, no wheezing, rales, rhonchi or crackles. Normal respiratory effort and patient is not tachypenic. No accessory muscle use.  Unlabored breathing and not wearing supplemental oxygen nasal cannula Cardiovascular: RRR, no murmurs / rubs / gallops. S1 and S2 auscultated. No extremity edema.  Abdomen: Soft, non-tender, non-distended. Bowel sounds positive.  GU: Deferred. Musculoskeletal: No clubbing / cyanosis of digits/nails. No joint deformity upper and lower extremities. Skin: No rashes, lesions, ulcers on limited skin evaluation. No induration; Warm and dry.  Neurologic: CN 2-12 grossly intact with no focal deficits. Romberg sign and  cerebellar reflexes not assessed.  Psychiatric: Normal judgment and insight. Alert and oriented x 3. Normal mood and appropriate affect.   Data Reviewed: I have personally reviewed following labs and imaging studies  CBC: Recent Labs  Lab 07/10/21 2354 07/12/21 0458 07/13/21 0436 07/14/21 0458  WBC 8.3 8.2 11.5* 8.4  NEUTROABS 6.4  --  9.2* 6.3  HGB 15.0 12.0 13.8 13.0  HCT 48.0* 38.4 42.8 40.7  MCV 89.2 89.5 87.0 87.9  PLT 245 192 218 650    Basic Metabolic Panel: Recent Labs  Lab 07/10/21 2354 07/12/21 0458 07/13/21 0436 07/14/21 0458  NA 141 135 137 140  K 3.8 4.0 3.9 3.4*  CL 108 107 109 109  CO2 24 22 20* 24  GLUCOSE 103* 208* 135* 167*  BUN 23 28* 21 19  CREATININE 1.05* 1.15* 0.85 1.05*  CALCIUM 9.2 8.3* 8.9 8.5*  MG  --   --  2.1 1.9  PHOS  --   --  2.3* 2.5    GFR: Estimated Creatinine Clearance: 44.2 mL/min (A) (by C-G formula based on SCr of 1.05 mg/dL (H)). Liver Function Tests: Recent Labs  Lab 07/13/21 0436 07/14/21 0458  AST 13* 14*  ALT 12 12  ALKPHOS 99 97  BILITOT 0.9 0.7  PROT 8.0 6.5  ALBUMIN 3.6 2.8*    No results for input(s): LIPASE, AMYLASE in the last 168 hours. No results for input(s): AMMONIA in the last 168 hours. Coagulation Profile: No results for input(s): INR, PROTIME in the last 168 hours. Cardiac Enzymes: No results for input(s): CKTOTAL, CKMB, CKMBINDEX, TROPONINI in the last 168 hours. BNP (last 3 results) No results for input(s): PROBNP in the last 8760 hours. HbA1C: No results for input(s): HGBA1C in the last 72 hours. CBG: Recent Labs  Lab 07/13/21 1148 07/13/21 1601 07/13/21 2130 07/14/21 0741 07/14/21 1206  GLUCAP 128* 216* 102* 166* 128*    Lipid Profile: No results for input(s): CHOL, HDL, LDLCALC, TRIG, CHOLHDL, LDLDIRECT in the last 72 hours. Thyroid Function Tests: No results for input(s): TSH, T4TOTAL, FREET4, T3FREE, THYROIDAB in the last 72  hours. Anemia Panel: No results for input(s):  VITAMINB12, FOLATE, FERRITIN, TIBC, IRON, RETICCTPCT in the last 72 hours. Sepsis Labs: No results for input(s): PROCALCITON, LATICACIDVEN in the last 168 hours.  Recent Results (from the past 240 hour(s))  Blood culture (routine x 2)     Status: None (Preliminary result)   Collection Time: 07/10/21 11:54 PM   Specimen: Left Antecubital; Blood  Result Value Ref Range Status   Specimen Description   Final    LEFT ANTECUBITAL Performed at [facility] [address], [place], [place] [address]    Special Requests   Final    BOTTLES DRAWN AEROBIC AND ANAEROBIC Blood Culture adequate volume Performed at [facility] [address] [place], [place] [address]    Culture   Final    NO GROWTH 3 DAYS Performed at [facility], [place] [address] [place], [place] [address]    Report Status PENDING  Incomplete  Blood culture (routine x 2)     Status: None (Preliminary result)   Collection Time: 07/10/21 11:54 PM   Specimen: Right Antecubital; Blood  Result Value Ref Range Status   Specimen Description   Final    RIGHT ANTECUBITAL Performed at [facility] [address] [place], [place] [address]    Special Requests   Final    BOTTLES DRAWN AEROBIC AND ANAEROBIC Blood Culture adequate volume Performed at [facility] [address], [place], [place] [address]    Culture   Final    NO GROWTH 3 DAYS Performed at [facility], [place] [address], [place], [place] [address]    Report Status PENDING  Incomplete  Resp Panel by RT-PCR (Flu A&B, Covid) Nasopharyngeal Swab     Status: None   Collection Time: 07/11/21  7:54 AM   Specimen: Nasopharyngeal Swab; Nasopharyngeal(NP) swabs in vial transport medium  Result Value Ref Range Status   SARS Coronavirus 2 by RT PCR NEGATIVE NEGATIVE Final    Comment: (NOTE) SARS-CoV-2 target nucleic acids are NOT DETECTED.  The SARS-CoV-2 RNA is generally detectable  in upper respiratory specimens during the acute phase of infection. The lowest concentration of SARS-CoV-2 viral copies this assay can detect is 138 copies/mL. A negative result does not preclude SARS-Cov-2 infection and should not be used as the sole basis for treatment or other patient management decisions. A negative result may occur with  improper specimen collection/handling, submission of specimen other than nasopharyngeal swab, presence of viral mutation(s) within the areas targeted by this assay, and inadequate number of viral copies(<138 copies/mL). A negative result must be combined with clinical observations, patient history, and epidemiological information. The expected result is Negative.  Fact Sheet for Patients:  EntrepreneurPulse.com.au  Fact Sheet for Healthcare Providers:  IncredibleEmployment.be  This test is no t yet approved or cleared by the [place] FDA and  has been authorized for detection and/or diagnosis of SARS-CoV-2 by FDA under an Emergency Use Authorization (EUA). This EUA will remain  in effect (meaning this test can be used) for the duration of the COVID-19 declaration under Section 564(b)(1) of the Act, 21 U.S.C.section 360bbb-3(b)(1), unless the authorization is terminated  or revoked sooner.       Influenza A by PCR NEGATIVE NEGATIVE Final   Influenza B by PCR NEGATIVE NEGATIVE Final    Comment: (NOTE) The Xpert Xpress SARS-CoV-2/FLU/RSV plus assay is intended as an aid in the diagnosis of influenza from Nasopharyngeal swab specimens  and should not be used as a sole basis for treatment. Nasal washings and aspirates are unacceptable for Xpert Xpress SARS-CoV-2/FLU/RSV testing.  Fact Sheet for Patients: EntrepreneurPulse.com.au  Fact Sheet for Healthcare Providers: IncredibleEmployment.be  This test is not yet approved or cleared by the [place] FDA and has  been authorized for detection and/or diagnosis of SARS-CoV-2 by FDA under an Emergency Use Authorization (EUA). This EUA will remain in effect (meaning this test can be used) for the duration of the COVID-19 declaration under Section 564(b)(1) of the Act, 21 U.S.C. section 360bbb-3(b)(1), unless the authorization is terminated or revoked.  Performed at [facility], [place] [address] [place], [place] [address]   Surgical PCR screen     Status: Abnormal   Collection Time: 07/11/21  9:14 AM   Specimen: Nasal Mucosa; Nasal Swab  Result Value Ref Range Status   MRSA, PCR NEGATIVE NEGATIVE Final   Staphylococcus aureus POSITIVE (A) NEGATIVE Final    Comment: (NOTE) The Xpert SA Assay (FDA approved for NASAL specimens in patients 86 years of age and older), is one component of a comprehensive surveillance program. It is not intended to diagnose infection nor to guide or monitor treatment. Performed at [facility], [place] [address], [place], [place] [address]   Aerobic/Anaerobic Culture w Gram Stain (surgical/deep wound)     Status: None (Preliminary result)   Collection Time: 07/11/21  4:32 PM   Specimen: PATH Other; Tissue  Result Value Ref Range Status   Specimen Description   Final    TISSUE RIGHT OLECRANON BURSA Performed at [facility] [address] [place], [place] [address]    Special Requests   Final    NONE Performed at [facility], [place] [address], [place], [place] [address]    Gram Stain   Final    FEW WBC PRESENT, PREDOMINANTLY PMN NO ORGANISMS SEEN Performed at [facility], [place] [address] [place], [place] [address]    Culture ABUNDANT STAPHYLOCOCCUS AUREUS  Final   Report Status PENDING  Incomplete   Organism ID, Bacteria STAPHYLOCOCCUS AUREUS  Final      Susceptibility   Staphylococcus aureus - MIC*    CIPROFLOXACIN <=0.5 SENSITIVE Sensitive     ERYTHROMYCIN >=8 RESISTANT  Resistant     GENTAMICIN <=0.5 SENSITIVE Sensitive     OXACILLIN <=0.25 SENSITIVE Sensitive     TETRACYCLINE <=1 SENSITIVE Sensitive     VANCOMYCIN <=0.5 SENSITIVE Sensitive     TRIMETH/SULFA <=10 SENSITIVE Sensitive     CLINDAMYCIN RESISTANT Resistant     RIFAMPIN <=0.5 SENSITIVE Sensitive     Inducible Clindamycin POSITIVE Resistant     * ABUNDANT STAPHYLOCOCCUS AUREUS  Aerobic/Anaerobic Culture w Gram Stain (surgical/deep wound)     Status: None (Preliminary result)   Collection Time: 07/11/21  4:36 PM   Specimen: PATH Other; Tissue  Result Value Ref Range Status   Specimen Description   Final    TISSUE RIGHT OLECRANON BURSA FLUID Performed at [facility] [address], [place], [place] [address]    Special Requests SWAB  Final   Gram Stain   Final    FEW WBC PRESENT, PREDOMINANTLY PMN FEW GRAM POSITIVE COCCI IN PAIRS Performed at [facility], [place] [address], [place], [place] [address]    Culture ABUNDANT STAPHYLOCOCCUS AUREUS  Final   Report Status PENDING  Incomplete   Organism ID, Bacteria STAPHYLOCOCCUS AUREUS  Final  Susceptibility   Staphylococcus aureus - MIC*    CIPROFLOXACIN <=0.5 SENSITIVE Sensitive     ERYTHROMYCIN >=8 RESISTANT Resistant     GENTAMICIN <=0.5 SENSITIVE Sensitive     OXACILLIN 0.5 SENSITIVE Sensitive     TETRACYCLINE <=1 SENSITIVE Sensitive     VANCOMYCIN 1 SENSITIVE Sensitive     TRIMETH/SULFA <=10 SENSITIVE Sensitive     CLINDAMYCIN RESISTANT Resistant     RIFAMPIN <=0.5 SENSITIVE Sensitive     Inducible Clindamycin POSITIVE Resistant     * ABUNDANT STAPHYLOCOCCUS AUREUS     RN Pressure Injury Documentation:     Estimated body mass index is 23.38 kg/m as calculated from the following:   Height as of this encounter: _0  (1.6 m).   Weight as of this encounter: 59.9 kg.  Malnutrition Type:   Malnutrition Characteristics:   Nutrition Interventions:    Radiology Studies: [place] EKG SITE RITE  Result  Date: 07/14/2021 If Site Rite image not attached, placement could not be confirmed due to current cardiac rhythm.   Scheduled Meds:  aspirin EC  81 mg Oral Q M,W,F   atorvastatin  40 mg Oral Daily   docusate sodium  100 mg Oral BID   hydrALAZINE  50 mg Oral Q8H   insulin aspart  0-15 Units Subcutaneous TID WC   insulin aspart  0-5 Units Subcutaneous QHS   isosorbide mononitrate  60 mg Oral Daily   metoprolol succinate  50 mg Oral Daily   mupirocin ointment  1 application Nasal BID   potassium chloride  40 mEq Oral BID   sacubitril-valsartan  1 tablet Oral BID   sodium chloride flush  3 mL Intravenous Q12H   spironolactone  25 mg Oral Daily   Continuous Infusions:   ceFAZolin (ANCEF) IV     methocarbamol (ROBAXIN) IV      LOS: 3 days   Kerney Elbe, DO [facility] PAGER is on AMION  If 7PM-7AM, please contact night-coverage [email or web address]

## 2021-07-14 NOTE — Care Management Important Message (Signed)
Medicare IM printed for [facility] to give to the patient. 

## 2021-07-14 NOTE — TOC Progression Note (Addendum)
Transition of Care [facility]) - Progression Note    Patient Details  Name: [name] MRN: [id number] Date of Birth: 16-Dec-1955  Transition of Care [facility]) CM/SW Contact  Darleene Cleaver, [place] Phone Number: 07/14/2021, 2:27 PM  Clinical Narrative:     CSW was informed that patient will need to go home on IV antibiotics now.  CSW notified Pam from [facility] home infusion team, she is unable to get patient set up today for IV antibiotics, but can for tomorrow.  CSW updated attending physician and weekend [facility] team.   Expected Discharge Plan: Home w Home Health Services Barriers to Discharge: Continued Medical Work up  Expected Discharge Plan and Services Expected Discharge Plan: Home w Home Health Services   Discharge Planning Services: CM Consult   Living arrangements for the past 2 months: Single Family Home                 DME Arranged: Gilmer Mor DME Agency: [employer] Date DME Agency Contacted: 07/13/21 Time DME Agency Contacted: [phone] Representative spoke with at DME Agency: Ian Malkin HH Arranged: PT HH Agency: [facility] Date [facility] Agency Contacted: 07/13/21 Time HH Agency Contacted: 1545 Representative spoke with at [facility] Agency: Amy   Social Determinants of Health (SDOH) Interventions    Readmission Risk Interventions No flowsheet data found.

## 2021-07-14 NOTE — Progress Notes (Signed)
PHARMACY CONSULT NOTE FOR:  OUTPATIENT  PARENTERAL ANTIBIOTIC THERAPY (OPAT)  Indication: septic arthritis Regimen: Cefazolin 2g IV q8 hours End date: 08/09/21  IV antibiotic discharge orders are pended. To discharging provider:  please sign these orders via discharge navigator,  Select New Orders & click on the button choice - Manage This Unsigned Work.     Thank you for allowing pharmacy to be a part of this patient's care.  Rexford Maus, PharmD 07/14/2021 1:49 PM

## 2021-07-15 LAB — GLUCOSE, CAPILLARY
Glucose-Capillary: 153 mg/dL — ABNORMAL HIGH (ref 70–99)
Glucose-Capillary: 234 mg/dL — ABNORMAL HIGH (ref 70–99)

## 2021-07-15 MED ORDER — CEFAZOLIN IV (FOR PTA / DISCHARGE USE ONLY): 2.0000 g | Freq: Three times a day (TID) | INTRAVENOUS | 0 refills | Status: AC

## 2021-07-15 MED ORDER — BISACODYL 5 MG PO TBEC: 5.0000 mg | DELAYED_RELEASE_TABLET | Freq: Every day | ORAL | 0 refills | Status: DC | PRN

## 2021-07-15 MED ORDER — HEPARIN SOD (PORK) LOCK FLUSH 100 UNIT/ML IV SOLN
250.0000 [IU] | INTRAVENOUS | Status: AC | PRN
  Administered 2021-07-15: 250 [IU]
  Filled 2021-07-15: qty 2.5

## 2021-07-15 MED ORDER — ACETAMINOPHEN 325 MG PO TABS: 325.0000 mg | ORAL_TABLET | Freq: Four times a day (QID) | ORAL | 0 refills | Status: DC | PRN

## 2021-07-15 NOTE — Discharge Summary (Addendum)
Physician Discharge Summary  Maria Foster NIO:[id number] DOB: 1955-12-23 DOA: 07/10/2021  PCP: Carlena Hurl, PA-C  Admit date: 07/10/2021 Discharge date: 07/15/2021  Admitted From: Home Disposition: Home with Home Health  Recommendations for Outpatient Follow-up:  Follow up with PCP in 1-2 weeks Follow up with Orthopedic Surgery Dr. Alvan Dame within 1-2 weeks Follow up with ID Dr. Gale Journey on 07/26/21 at 4:00 pm Follow up with Rheumatology within 1-2 weeks Please obtain CMP/CBC, Mag, Phos in one week and continue Weekly CBC, CMP, CRP levels and Fax to [phone] Please follow up on the following pending results:  Home Health: Yes  Equipment/Devices: PICC LINE     Discharge Condition: Stable CODE STATUS: FULL CODE Diet recommendation: Heart Healthy Carb Modified Diet   Brief/Interim Summary: The patient is a 65 year old African-American female with a past medical history significant for but not limited to diabetes mellitus type 2, hypertension, rheumatoid arthritis on Plaquenil, history of chronic mild CHF, CKD stage IIIa, as well as other comorbidities who presented with a right elbow infection following a steroid injection a week ago.  She saw Dr. Benjamine Mola last Monday and gave her steroid shot in her arm because it is really sore.  On Sunday a.m. she woke up in a little knot on her arm that burst and there is drainage.  She sent a picture to Dr. Benjamine Mola and encouraged to come to the ER for concern for infection.  She denies any fevers but has had rheumatoid arthritis for over 10 years and was recently started on Humira as well.  She continues to have symptoms primarily in her bilateral hand and right elbow for her rheumatoid arthritis.  Because she is having purulent drainage from the elbow she presented to the ED and ED PA spoke with Dr. Alvan Dame from orthopedics who made her n.p.o. and took her to the operating room and she is status post day 2 of an excisional and nonexcisional debridement of the right  elbow olecranon and excision of the right elbow rheumatoid nodules.  Wound cultures are growing staph aureus but will await final sensitivities prior to discharging home and shows MSSA sensitivities to cefazolin.  ID was consulted and recommending continued IV cefazolin for 4 weeks and having PICC line placed with OPAT orders in place.  Once her PICC line is placed and she is gotten some training as well as home health accepting her for her home infusions she can be discharged safely and this was done today.  She is medically stable to be discharged at this time and follow-up with PCP, ID as well as orthopedic surgery as well as her rheumatologist in outpatient setting.   Discharge Diagnoses:  Principal Problem:   Septic arthritis of elbow, right ([place]) Active Problems:   Diabetes ([place])   Rheumatoid arthritis involving both hands ([place])   Essential hypertension, benign   Hyperlipidemia associated with type 2 diabetes mellitus (HCC)   Tobacco use   Prolonged QT interval   Stage 3a chronic kidney disease (HCC)   Septic olecranon bursitis of right elbow  Acute Septic Olecranon Bursitis of the right elbow -Rheumatoid arthritis and received a steroid injection of the right elbow 07/03/2021 and now has septic arthritis -She was admitted to [facility] as inpatient and will continue inpatient hospitalization -Orthopedic surgery was consulted and they were planning on taking the patient for the OR for a washout procedure and this was done yesterday and she is postoperative day 3 of an excisional and nonexcisional debridement of the right  elbow olecranon and excision of the right elbow rheumatoid nodules -We will continue empiric antibiotics with IV vancomycin given her immunocompromised state and given that her MRSA PCR was positive for Staph aureus as well as a culture growing staph aureus; will discontinue IV cefepime -Culture of the right olecranon bursa showing:   Susceptibility            Staphylococcus aureus      MIC      CIPROFLOXACIN <=0.5 SENSI... Sensitive      CLINDAMYCIN RESISTANT  Resistant      ERYTHROMYCIN >=8 RESISTANT  Resistant      GENTAMICIN <=0.5 SENSI... Sensitive      Inducible Clindamycin POSITIVE  Resistant      OXACILLIN <=0.25 SENS... Sensitive      RIFAMPIN <=0.5 SENSI... Sensitive      TETRACYCLINE <=1 SENSITIVE  Sensitive      TRIMETH/SULFA <=10 SENSIT... Sensitive      VANCOMYCIN <=0.5 SENSI... Sensitive      -She does not appear to be systemically ill and there is currently no concern for sepsis -She is not febrile or having any leukocytosis -We will continue empiric antibiotics as above and de-escalate as necessary -ESR was 58 -IV fluid hydration has now stopped -Orthopedic surgery evaluating and recommending nonweightbearing in the right upper extremity and recommending discussing with a hand team regarding for home antibiotic regimen and today her Penrose drain was removed -We will follow up on blood cultures x2 and currently only showing no growth to date 2 days -Continue with pain control with p.o. hydrocodone-acetaminophen 5-325 mg and 7.5-325 mg every 4 hours as needed for moderate and severe pain and continue with morphine 0.52-1 mg every 2 hours as needed severe pain if uncontrolled with p.o. analgesics -Also continue methocarbamol 500 mg p.o./IV every 6 as needed for muscle spasms -Orthopedics changed her dressing today and her drain was removed and recommending home on oral antibiotics however after reviewing her sensitivities ID was consulted and recommending 4 weeks of IV cefazolin given her infection with OPAT orders placed and ID recommending PICC line placement and this was done and ID recommends continuing antibiotics from 07/12/2021 until 08/09/2021 -Patient is accepted for home health infusion services and PICC line is placed as well as training is done she can be discharged home on 4 weeks of cefazolin and she will need to follow-up  with Dr. Doug Sou in outpatient setting after 08/09/2021   Rheumatoid Arthritis -She has fairly advanced disease based on clinical appearance -She was last seen by Dr. Benjamine Mola on 8/15 and has seropositive RA and chronic tophaceous gout -She was recently started on Plaquenil with possible mild improvement -She had the R olecranon bursa injected at her last visit likely seeding infection into her olecranon bursa -Will hold Plaquenil in the setting of active infection -Once source control is obtained, it is likely reasonable to resume Plaquenil but will defer to PCP   Chronic combined CHF -Severe MR, EF 50-55% on 6/29 echo -Appears to be compensated at this time -Continue aspirin 81 mg p.o. every Monday Wednesday Friday as well as Imdur 60 mg p.o. daily and will also continue hydralazine 50 mg every 8 hours -Continue spironolactone 25 mg p.o. daily but hold Lasix for now and stop fluids after 12 more hours -Continue Entresto 97-103 1 tab p.o. twice daily as well as metoprolol succinate 50 mg p.o. daily -Strict I's and O's and daily weights: She is +2.383 Liters  -Continue monitor for signs and  symptoms of volume overload -Repeat chest x-ray in a.m.   Diabetes mellitus type 2 -6/16 A1c was 7.0 -Currently holding Tradjenta -Cover with moderate-scale SSI before meals and at bedtime -CBGs ranging from 128-261   Leukocytosis -Mild and Reactive -WBC went from 8.2 -> 11.5 is now improved down to 8.4 on last check -C/w Abx as Above -Continue to Monitor and Trend and Repeat CBC within 1 week and continue to monitor weekly labs and send to ID clinic   HTN -Continue hydralazine 50 mg p.o. every 8, spironolactone 25 mg p.o. daily, Entresto 97-1031 tab p.o. twice daily, metoprolol succinate 50 mg p.o. daily -Continue monitor blood pressures per protocol -Last blood pressure reading was elevated at 177/89   HLD -Continue Atorvastatin 40 mg daily   Prolonged QTc -Likely associated with mild  dehydration, infection -Will attempt to avoid QT-prolonging medications such as PPI, nausea meds, SSRIs -Repeat EKG the day before yesterday showed a qTC of 493   Stage 3a CKD Metabolic Acidosis  -Appears to be chronic/stable and patient's BUNs/creatinine went from 23/1.05 and is now 28/1.15 -> 21/0.85 and now trended up to 19/1.05 on last check -Patient's metabolic acidosis is improved and now she had a CO2 24, anion gap of 7, chloride level of 109 -Hold Farxiga for now but will continue the rest of her medications as above -Avoid further nephrotoxic medications, contrast dyes, hypotension and renally adjust medications -Continue monitor and trend renal function carefully and repeat CMP in a.m.   Tobacco Abuse -No longer smoking regularly but continues to smoke periodically -Ongoing total cessation should be encouraged  Discharge Instructions Discharge Instructions     (HEART FAILURE PATIENTS) Call MD:  Anytime you have any of the following symptoms: 1) 3 pound weight gain in 24 hours or 5 pounds in 1 week 2) shortness of breath, with or without a dry hacking cough 3) swelling in the hands, feet or stomach 4) if you have to sleep on extra pillows at night in order to breathe.   Complete by: As directed    [facility] pharmacist to adjust dose for Vancomycin, Aminoglycosides and other anti-infective therapies as requested by physician.   Complete by: As directed    [facility] to provide Cath Flo 2mg    Complete by: As directed    Administer for PICC line occlusion and as ordered by physician for other access device issues.   Anaphylaxis Kit: Provided to treat any anaphylactic reaction to the medication being provided to the patient if First Dose or when requested by physician   Complete by: As directed    Epinephrine 1mg /ml vial / amp: Administer 0.3mg  (0.31ml) subcutaneously once for moderate to severe anaphylaxis, nurse to call physician and pharmacy when reaction  occurs and call 911 if needed for immediate care   Diphenhydramine 50mg /ml IV vial: Administer 25-50mg  IV/IM PRN for first dose reaction, rash, itching, mild reaction, nurse to call physician and pharmacy when reaction occurs   Sodium Chloride 0.9% NS 532ml IV: Administer if needed for hypovolemic blood pressure drop or as ordered by physician after call to physician with anaphylactic reaction   Call MD for:  difficulty breathing, headache or visual disturbances   Complete by: As directed    Call MD for:  extreme fatigue   Complete by: As directed    Call MD for:  hives   Complete by: As directed    Call MD for:  persistant dizziness or light-headedness   Complete by: As directed  Call MD for:  persistant nausea and vomiting   Complete by: As directed    Call MD for:  redness, tenderness, or signs of infection (pain, swelling, redness, odor or green/yellow discharge around incision site)   Complete by: As directed    Call MD for:  severe uncontrolled pain   Complete by: As directed    Call MD for:  temperature >100.4   Complete by: As directed    Change dressing on IV access line weekly and PRN   Complete by: As directed    Diet - low sodium heart healthy   Complete by: As directed    Discharge instructions   Complete by: As directed    You were cared for by a hospitalist during your hospital stay. If you have any questions about your discharge medications or the care you received while you were in the hospital after you are discharged, you can call the unit and ask to speak with the hospitalist on call if the hospitalist that took care of you is not available. Once you are discharged, your primary care physician will handle any further medical issues. Please note that NO REFILLS for any discharge medications will be authorized once you are discharged, as it is imperative that you return to your primary care physician (or establish a relationship with a primary care physician if you do not  have one) for your aftercare needs so that they can reassess your need for medications and monitor your lab values.  Follow up with PCP, Orthopedic Surgery, and ID as an outpatient. Take all medications as prescribed. If symptoms change or worsen please return to the ED for evaluation   Discharge wound care:   Complete by: As directed    Per Ortho Reinforce Dressing   Flush IV access with Sodium Chloride 0.9% and Heparin 10 units/ml or 100 units/ml   Complete by: As directed    Home infusion instructions - [facility]   Complete by: As directed    Instructions: Flush IV access with Sodium Chloride 0.9% and Heparin 10units/ml or 100units/ml   Change dressing on IV access line: Weekly and PRN   Instructions Cath Flo 68m: Administer for PICC Line occlusion and as ordered by physician for other access device   [facility] pharmacist to adjust dose for: Vancomycin, Aminoglycosides and other anti-infective therapies as requested by physician   Increase activity slowly   Complete by: As directed    Method of administration may be changed at the discretion of home infusion pharmacist based upon assessment of the patient and/or caregiver's ability to self-administer the medication ordered   Complete by: As directed       Allergies as of 07/15/2021       Reactions   Metformin And Related Other (See Comments)   Kidney injury, GI upset, diarrhea        Medication List     TAKE these medications    Accu-Chek Guide Me w/Device Kit See admin instructions.   Accu-Chek Guide test strip Generic drug: glucose blood See admin instructions.   Accu-Chek Softclix Lancets lancets See admin instructions.   acetaminophen 325 MG tablet Commonly known as: TYLENOL Take 1-2 tablets (325-650 mg total) by mouth every 6 (six) hours as needed for mild pain (pain score 1-3 or temp > 100.5).   aspirin EC 81 MG tablet Take 81 mg by mouth every Monday, Wednesday, and Friday.    atorvastatin 40 MG tablet Commonly known as: LIPITOR Take  1 tablet (40 mg total) by mouth daily.   bisacodyl 5 MG EC tablet Commonly known as: DULCOLAX Take 1 tablet (5 mg total) by mouth daily as needed for moderate constipation.   ceFAZolin  IVPB Commonly known as: ANCEF Inject 2 g into the vein every 8 (eight) hours for 26 days. Indication:  septic arthritis First Dose: Yes Last Day of Therapy:  08/09/21 Labs - Once weekly:  CBC/D and BMP, Labs - Every other week:  ESR and CRP Method of administration: IV Push Method of administration may be changed at the discretion of home infusion pharmacist based upon assessment of the patient and/or caregiver's ability to self-administer the medication ordered.   dapagliflozin propanediol 10 MG Tabs tablet Commonly known as: Farxiga Take 1 tablet (10 mg total) by mouth daily before breakfast.   docusate sodium 100 MG capsule Commonly known as: COLACE Take 100 mg by mouth daily as needed for mild constipation.   Entresto 97-103 MG Generic drug: sacubitril-valsartan Take 1 tablet by mouth 2 (two) times daily.   ferrous gluconate 324 MG tablet Commonly known as: FERGON Take 1 tablet (324 mg total) by mouth 2 (two) times daily with a meal.   FreeStyle Libre 14 Day Sensor Misc 1 each by Does not apply route every 14 (fourteen) days.   furosemide 40 MG tablet Commonly known as: LASIX Take 0.5 tablets (20 mg total) by mouth daily.   hydrALAZINE 50 MG tablet Commonly known as: APRESOLINE Take 1 tablet (50 mg total) by mouth every 8 (eight) hours.   HYDROcodone-acetaminophen 5-325 MG tablet Commonly known as: NORCO/VICODIN Take 1-2 tablets by mouth every 6 (six) hours as needed for severe pain.   hydroxychloroquine 200 MG tablet Commonly known as: PLAQUENIL Take 1 tablet (200 mg total) by mouth daily.   isosorbide mononitrate 60 MG 24 hr tablet Commonly known as: IMDUR Take 1 tablet (60 mg total) by mouth daily.   linagliptin  5 MG Tabs tablet Commonly known as: TRADJENTA Take 1 tablet (5 mg total) by mouth daily.   metoprolol succinate 50 MG 24 hr tablet Commonly known as: TOPROL-XL TAKE 1 TABLET(50 MG) BY MOUTH DAILY WITH OR IMMEDIATELY FOLLOWING A MEAL What changed: See the new instructions.   polyethylene glycol 17 g packet Commonly known as: MIRALAX / GLYCOLAX Take 17 g by mouth daily as needed for mild constipation.   potassium chloride SA 20 MEQ tablet Commonly known as: KLOR-CON Take 2 tablets (40 mEq total) by mouth 2 (two) times daily.   spironolactone 25 MG tablet Commonly known as: ALDACTONE Take 1 tablet (25 mg total) by mouth daily.   Vitamin D (Ergocalciferol) 1.25 MG (50000 UNIT) Caps capsule Commonly known as: DRISDOL Take 50,000 Units by mouth once a week.               Durable Medical Equipment  (From admission, onward)           Start     Ordered   07/13/21 1210  For home use only DME Cane  Once       Comments: [facility]   07/13/21 1209              Discharge Care Instructions  (From admission, onward)           Start     Ordered   07/15/21 0000  Change dressing on IV access line weekly and PRN  (Home infusion instructions - [facility] )  07/15/21 1517   07/15/21 0000  Discharge wound care:       Comments: Per Ortho Reinforce Dressing   07/15/21 0907            Follow-up Information     Paralee Cancel, MD. Schedule an appointment as soon as possible for a visit in 2 week(s).   Specialty: Orthopedic Surgery Contact information: [address] [place] [address] [place] [address] [phone]         [facility] Follow up.   Why: HH physical therapy        Tysinger, Camelia Eng, PA-C. Call.   Specialty: Family Medicine Why: Follow up within 1-2 weeks Contact information: [place] [address] [phone]         Skeet Latch, MD .   Specialty: Cardiology Contact information: [address] [place] [address] [phone]         Jabier Mutton, MD. Call.   Specialty: Infectious Diseases Contact information: [address] [place] [address] [phone]                Allergies  Allergen Reactions   Metformin And Related Other (See Comments)    Kidney injury, GI upset, diarrhea   Consultations: Orthopedic Surgery Infectious Diseases  Procedures/Studies: DG Elbow Complete Right  Result Date: 07/10/2021 CLINICAL DATA:  Swelling, drainage, history of rheumatoid arthritis EXAM: RIGHT ELBOW - COMPLETE 3+ VIEW COMPARISON:  None. FINDINGS: There is no acute fracture or dislocation. Elbow alignment is maintained. There is soft tissue swelling along the dorsal aspect of the elbow with focal outpouching overlying the proximal radius. There is no definite soft tissue gas. There is no elbow effusion. IMPRESSION: 1. Soft tissue swelling over the dorsum of the elbow with focal outpouching over the proximal radius. Targeted ultrasound may be useful to assess for drainable fluid collection. 2. No acute osseous abnormality. Electronically Signed   By: Valetta Mole M.D.   On: 07/10/2021 19:01   [place] EKG SITE RITE  Result Date: 07/14/2021 If Site Rite image not attached, placement could not be confirmed due to current cardiac rhythm.    Subjective: Seen and examined at bedside and got her PICC line yesterday.  No chest pain or shortness of breath.  Felt well and back to her baseline.  Arm was hurting.  No other concerns or plans at this time.  Discharge Exam: Vitals:   07/14/21 2152 07/15/21 0515  BP: 117/61 122/73  Pulse: 65 62  Resp: 18 18  Temp: 98.1 F (36.7 C) 98.2 F (36.8 C)  SpO2: 97% 97%   Vitals:   07/14/21 0610 07/14/21 1315 07/14/21 2152 07/15/21 0515  BP: (!) 158/85 (!) 186/82 117/61 122/73  Pulse: 66 66 65 62  Resp:  _0 Temp: 98.6 F (37 C) (!) 97.5 F (36.4 C) 98.1 F (36.7 C) 98.2 F (36.8 C)   TempSrc: Oral Oral Oral Oral  SpO2: 100% 99% 97% 97%  Weight:      Height:       General: Pt is alert, awake, not in acute distress Cardiovascular: RRR, S1/S2 +, no rubs, no gallops Respiratory: Diminished bilaterally, no wheezing, no rhonchi; unlabored breathing Abdominal: Soft, NT, ND, bowel sounds + Extremities: Left upper extremity is wrapped.  Trace edema, no cyanosis  The results of significant diagnostics from this hospitalization (including imaging, microbiology, ancillary and laboratory) are listed below for reference.    Microbiology: Recent Results (  from the past 240 hour(s))  Blood culture (routine x 2)     Status: None (Preliminary result)   Collection Time: 07/10/21 11:54 PM   Specimen: Left Antecubital; Blood  Result Value Ref Range Status   Specimen Description   Final    LEFT ANTECUBITAL Performed at [facility] [address] [place], [place] [address]    Special Requests   Final    BOTTLES DRAWN AEROBIC AND ANAEROBIC Blood Culture adequate volume Performed at [facility] [address] [place], [place] [address]    Culture   Final    NO GROWTH 3 DAYS Performed at [facility], [place] [address] [place], [place] [address]    Report Status PENDING  Incomplete  Blood culture (routine x 2)     Status: None (Preliminary result)   Collection Time: 07/10/21 11:54 PM   Specimen: Right Antecubital; Blood  Result Value Ref Range Status   Specimen Description   Final    RIGHT ANTECUBITAL Performed at [facility] [address], [place], [place] [address]    Special Requests   Final    BOTTLES DRAWN AEROBIC AND ANAEROBIC Blood Culture adequate volume Performed at [facility] [address], [place], [place] [address]    Culture   Final    NO GROWTH 3 DAYS Performed at [facility], [place] [address] [place], [place] [address]    Report Status PENDING  Incomplete   Resp Panel by RT-PCR (Flu A&B, Covid) Nasopharyngeal Swab     Status: None   Collection Time: 07/11/21  7:54 AM   Specimen: Nasopharyngeal Swab; Nasopharyngeal(NP) swabs in vial transport medium  Result Value Ref Range Status   SARS Coronavirus 2 by RT PCR NEGATIVE NEGATIVE Final    Comment: (NOTE) SARS-CoV-2 target nucleic acids are NOT DETECTED.  The SARS-CoV-2 RNA is generally detectable in upper respiratory specimens during the acute phase of infection. The lowest concentration of SARS-CoV-2 viral copies this assay can detect is 138 copies/mL. A negative result does not preclude SARS-Cov-2 infection and should not be used as the sole basis for treatment or other patient management decisions. A negative result may occur with  improper specimen collection/handling, submission of specimen other than nasopharyngeal swab, presence of viral mutation(s) within the areas targeted by this assay, and inadequate number of viral copies(<138 copies/mL). A negative result must be combined with clinical observations, patient history, and epidemiological information. The expected result is Negative.  Fact Sheet for Patients:  EntrepreneurPulse.com.au  Fact Sheet for Healthcare Providers:  IncredibleEmployment.be  This test is no t yet approved or cleared by the [place] FDA and  has been authorized for detection and/or diagnosis of SARS-CoV-2 by FDA under an Emergency Use Authorization (EUA). This EUA will remain  in effect (meaning this test can be used) for the duration of the COVID-19 declaration under Section 564(b)(1) of the Act, 21 U.S.C.section 360bbb-3(b)(1), unless the authorization is terminated  or revoked sooner.       Influenza A by PCR NEGATIVE NEGATIVE Final   Influenza B by PCR NEGATIVE NEGATIVE Final    Comment: (NOTE) The Xpert Xpress SARS-CoV-2/FLU/RSV plus assay is intended as an aid in the diagnosis of influenza from  Nasopharyngeal swab specimens and should not be used as a sole basis for treatment. Nasal washings and aspirates are unacceptable for Xpert Xpress SARS-CoV-2/FLU/RSV testing.  Fact Sheet for Patients: EntrepreneurPulse.com.au  Fact Sheet for Healthcare Providers:  IncredibleEmployment.be  This test is not yet approved or cleared by the [place] and has been authorized for detection and/or diagnosis of SARS-CoV-2 by FDA under an Emergency Use Authorization (EUA). This EUA will remain in effect (meaning this test can be used) for the duration of the COVID-19 declaration under Section 564(b)(1) of the Act, 21 U.S.C. section 360bbb-3(b)(1), unless the authorization is terminated or revoked.  Performed at [facility], [place] [address], [place], [place] [address]   Surgical PCR screen     Status: Abnormal   Collection Time: 07/11/21  9:14 AM   Specimen: Nasal Mucosa; Nasal Swab  Result Value Ref Range Status   MRSA, PCR NEGATIVE NEGATIVE Final   Staphylococcus aureus POSITIVE (A) NEGATIVE Final    Comment: (NOTE) The Xpert SA Assay (FDA approved for NASAL specimens in patients 43 years of age and older), is one component of a comprehensive surveillance program. It is not intended to diagnose infection nor to guide or monitor treatment. Performed at [facility], [place] [address] [place], [place] [address]   Aerobic/Anaerobic Culture w Gram Stain (surgical/deep wound)     Status: None (Preliminary result)   Collection Time: 07/11/21  4:32 PM   Specimen: PATH Other; Tissue  Result Value Ref Range Status   Specimen Description   Final    TISSUE RIGHT OLECRANON BURSA Performed at [place] [address], [place], [place] [address]    Special Requests   Final    NONE Performed at [facility], [place] [address] [place], [place] [address]    Gram Stain   Final     FEW WBC PRESENT, PREDOMINANTLY PMN NO ORGANISMS SEEN Performed at [facility], [place] [address] [place], [place] [address]    Culture   Final    ABUNDANT STAPHYLOCOCCUS AUREUS NO ANAEROBES ISOLATED; CULTURE IN PROGRESS FOR 5 DAYS    Report Status PENDING  Incomplete   Organism ID, Bacteria STAPHYLOCOCCUS AUREUS  Final      Susceptibility   Staphylococcus aureus - MIC*    CIPROFLOXACIN <=0.5 SENSITIVE Sensitive     ERYTHROMYCIN >=8 RESISTANT Resistant     GENTAMICIN <=0.5 SENSITIVE Sensitive     OXACILLIN <=0.25 SENSITIVE Sensitive     TETRACYCLINE <=1 SENSITIVE Sensitive     VANCOMYCIN <=0.5 SENSITIVE Sensitive     TRIMETH/SULFA <=10 SENSITIVE Sensitive     CLINDAMYCIN RESISTANT Resistant     RIFAMPIN <=0.5 SENSITIVE Sensitive     Inducible Clindamycin POSITIVE Resistant     * ABUNDANT STAPHYLOCOCCUS AUREUS  Aerobic/Anaerobic Culture w Gram Stain (surgical/deep wound)     Status: None (Preliminary result)   Collection Time: 07/11/21  4:36 PM   Specimen: PATH Other; Tissue  Result Value Ref Range Status   Specimen Description   Final    TISSUE RIGHT OLECRANON BURSA FLUID Performed at [facility] [place] [address], [place], [place] [address]    Special Requests SWAB  Final   Gram Stain   Final    FEW WBC PRESENT, PREDOMINANTLY PMN FEW GRAM POSITIVE COCCI IN PAIRS Performed at [facility], [place] [address], [place], [place] [address]    Culture   Final    ABUNDANT STAPHYLOCOCCUS AUREUS NO ANAEROBES ISOLATED; CULTURE IN PROGRESS FOR 5 DAYS    Report Status PENDING  Incomplete   Organism ID, Bacteria STAPHYLOCOCCUS AUREUS  Final      Susceptibility   Staphylococcus aureus -  MIC*    CIPROFLOXACIN <=0.5 SENSITIVE Sensitive     ERYTHROMYCIN >=8 RESISTANT Resistant     GENTAMICIN <=0.5 SENSITIVE Sensitive     OXACILLIN 0.5 SENSITIVE Sensitive     TETRACYCLINE <=1 SENSITIVE Sensitive     VANCOMYCIN 1 SENSITIVE Sensitive     TRIMETH/SULFA <=10  SENSITIVE Sensitive     CLINDAMYCIN RESISTANT Resistant     RIFAMPIN <=0.5 SENSITIVE Sensitive     Inducible Clindamycin POSITIVE Resistant     * ABUNDANT STAPHYLOCOCCUS AUREUS    Labs: BNP (last 3 results) Recent Labs    12/19/20 0030  BNP 3,428.7*   Basic Metabolic Panel: Recent Labs  Lab 07/10/21 2354 07/12/21 0458 07/13/21 0436 07/14/21 0458  NA 141 135 137 140  K 3.8 4.0 3.9 3.4*  CL 108 107 109 109  CO2 24 22 20* 24  GLUCOSE 103* 208* 135* 167*  BUN 23 28* 21 19  CREATININE 1.05* 1.15* 0.85 1.05*  CALCIUM 9.2 8.3* 8.9 8.5*  MG  --   --  2.1 1.9  PHOS  --   --  2.3* 2.5   Liver Function Tests: Recent Labs  Lab 07/13/21 0436 07/14/21 0458  AST 13* 14*  ALT 12 12  ALKPHOS 99 97  BILITOT 0.9 0.7  PROT 8.0 6.5  ALBUMIN 3.6 2.8*   No results for input(s): LIPASE, AMYLASE in the last 168 hours. No results for input(s): AMMONIA in the last 168 hours. CBC: Recent Labs  Lab 07/10/21 2354 07/12/21 0458 07/13/21 0436 07/14/21 0458  WBC 8.3 8.2 11.5* 8.4  NEUTROABS 6.4  --  9.2* 6.3  HGB 15.0 12.0 13.8 13.0  HCT 48.0* 38.4 42.8 40.7  MCV 89.2 89.5 87.0 87.9  PLT 245 192 218 191   Cardiac Enzymes: No results for input(s): CKTOTAL, CKMB, CKMBINDEX, TROPONINI in the last 168 hours. BNP: Invalid input(s): POCBNP CBG: Recent Labs  Lab 07/14/21 0741 07/14/21 1206 07/14/21 1731 07/14/21 2232 07/15/21 0732  GLUCAP 166* 128* 157* 261* 153*   D-Dimer No results for input(s): DDIMER in the last 72 hours. Hgb A1c No results for input(s): HGBA1C in the last 72 hours. Lipid Profile No results for input(s): CHOL, HDL, LDLCALC, TRIG, CHOLHDL, LDLDIRECT in the last 72 hours. Thyroid function studies No results for input(s): TSH, T4TOTAL, T3FREE, THYROIDAB in the last 72 hours.  Invalid input(s): FREET3 Anemia work up No results for input(s): VITAMINB12, FOLATE, FERRITIN, TIBC, IRON, RETICCTPCT in the last 72 hours. Urinalysis    Component Value Date/Time    COLORURINE YELLOW 11/29/2016 1940   APPEARANCEUR HAZY (A) 11/29/2016 1940   LABSPEC 1.015 05/04/2021 1230   PHURINE 6.0 11/29/2016 1940   GLUCOSEU >=500 (A) 11/29/2016 1940   HGBUR SMALL (A) 11/29/2016 1940   BILIRUBINUR negative 05/04/2021 1230   KETONESUR negative 05/04/2021 1230   KETONESUR 20 (A) 11/29/2016 1940   PROTEINUR trace (A) 05/04/2021 1230   PROTEINUR 100 (A) 11/29/2016 1940   UROBILINOGEN 1.0 10/17/2009 1021   NITRITE Negative 05/04/2021 1230   NITRITE NEGATIVE 11/29/2016 1940   LEUKOCYTESUR Negative 05/04/2021 1230   Sepsis Labs Invalid input(s): PROCALCITONIN,  WBC,  LACTICIDVEN Microbiology Recent Results (from the past 240 hour(s))  Blood culture (routine x 2)     Status: None (Preliminary result)   Collection Time: 07/10/21 11:54 PM   Specimen: Left Antecubital; Blood  Result Value Ref Range Status   Specimen Description   Final    LEFT ANTECUBITAL Performed at [facility], [address] [place], [place] [address]    Special Requests   Final    BOTTLES DRAWN AEROBIC AND ANAEROBIC Blood Culture adequate volume Performed at [place] [address] [place], [place] [address]    Culture   Final    NO GROWTH 3 DAYS Performed at [facility], [place] [address] [place], [place] [address]    Report Status PENDING  Incomplete  Blood culture (routine x 2)     Status: None (Preliminary result)   Collection Time: 07/10/21 11:54 PM   Specimen: Right Antecubital; Blood  Result Value Ref Range Status   Specimen Description   Final    RIGHT ANTECUBITAL Performed at [place] [address] [place], [place] [address]    Special Requests   Final    BOTTLES DRAWN AEROBIC AND ANAEROBIC Blood Culture adequate volume Performed at [place] [address], [place], [place] [address]    Culture   Final    NO GROWTH 3 DAYS Performed at [facility], [place] [address], [place], [place] [address]    Report Status PENDING  Incomplete  Resp Panel by RT-PCR (Flu A&B, Covid) Nasopharyngeal Swab     Status: None   Collection Time: 07/11/21  7:54 AM   Specimen: Nasopharyngeal Swab; Nasopharyngeal(NP) swabs in vial transport medium  Result Value Ref Range Status   SARS Coronavirus 2 by RT PCR NEGATIVE NEGATIVE Final    Comment: (NOTE) SARS-CoV-2 target nucleic acids are NOT DETECTED.  The SARS-CoV-2 RNA is generally detectable in upper respiratory specimens during the acute phase of infection. The lowest concentration of SARS-CoV-2 viral copies this assay can detect is 138 copies/mL. A negative result does not preclude SARS-Cov-2 infection and should not be used as the sole basis for treatment or other patient management decisions. A negative result may occur with  improper specimen collection/handling, submission of specimen other than nasopharyngeal swab, presence of viral mutation(s) within the areas targeted by this assay, and inadequate number of viral copies(<138 copies/mL). A negative result must be combined with clinical observations, patient history, and epidemiological information. The expected result is Negative.  Fact Sheet for Patients:  EntrepreneurPulse.com.au  Fact Sheet for Healthcare Providers:  IncredibleEmployment.be  This test is no t yet approved or cleared by the [place] FDA and  has been authorized for detection and/or diagnosis of SARS-CoV-2 by FDA under an Emergency Use Authorization (EUA). This EUA will remain  in effect (meaning this test can be used) for the duration of the COVID-19 declaration under Section 564(b)(1) of the Act, 21 U.S.C.section 360bbb-3(b)(1), unless the authorization is terminated  or revoked sooner.       Influenza A by PCR NEGATIVE NEGATIVE Final   Influenza B by PCR NEGATIVE NEGATIVE Final    Comment: (NOTE) The Xpert Xpress SARS-CoV-2/FLU/RSV plus assay  is intended as an aid in the diagnosis of influenza from Nasopharyngeal swab specimens and should not be used as a sole basis for treatment. Nasal washings and aspirates are unacceptable for Xpert Xpress SARS-CoV-2/FLU/RSV testing.  Fact Sheet for Patients: EntrepreneurPulse.com.au  Fact Sheet for Healthcare Providers: IncredibleEmployment.be  This test is not yet approved or cleared by the [place] FDA and has been authorized for detection and/or diagnosis of SARS-CoV-2 by FDA under an Emergency Use Authorization (EUA). This EUA will remain in effect (meaning this test can be used) for the duration of the COVID-19 declaration under Section 564(b)(1) of the  Act, 21 U.S.C. section 360bbb-3(b)(1), unless the authorization is terminated or revoked.  Performed at [facility], [place] [address] [place], [place] [address]   Surgical PCR screen     Status: Abnormal   Collection Time: 07/11/21  9:14 AM   Specimen: Nasal Mucosa; Nasal Swab  Result Value Ref Range Status   MRSA, PCR NEGATIVE NEGATIVE Final   Staphylococcus aureus POSITIVE (A) NEGATIVE Final    Comment: (NOTE) The Xpert SA Assay (FDA approved for NASAL specimens in patients 4 years of age and older), is one component of a comprehensive surveillance program. It is not intended to diagnose infection nor to guide or monitor treatment. Performed at [facility], [place] [address], [place], [place] [address]   Aerobic/Anaerobic Culture w Gram Stain (surgical/deep wound)     Status: None (Preliminary result)   Collection Time: 07/11/21  4:32 PM   Specimen: PATH Other; Tissue  Result Value Ref Range Status   Specimen Description   Final    TISSUE RIGHT OLECRANON BURSA Performed at [place] [address], [place], [place] [address]    Special Requests   Final    NONE Performed at [facility], [place]  [address], [place], [place] [address]    Gram Stain   Final    FEW WBC PRESENT, PREDOMINANTLY PMN NO ORGANISMS SEEN Performed at [facility], [place] [address], [place], [place] [address]    Culture   Final    ABUNDANT STAPHYLOCOCCUS AUREUS NO ANAEROBES ISOLATED; CULTURE IN PROGRESS FOR 5 DAYS    Report Status PENDING  Incomplete   Organism ID, Bacteria STAPHYLOCOCCUS AUREUS  Final      Susceptibility   Staphylococcus aureus - MIC*    CIPROFLOXACIN <=0.5 SENSITIVE Sensitive     ERYTHROMYCIN >=8 RESISTANT Resistant     GENTAMICIN <=0.5 SENSITIVE Sensitive     OXACILLIN <=0.25 SENSITIVE Sensitive     TETRACYCLINE <=1 SENSITIVE Sensitive     VANCOMYCIN <=0.5 SENSITIVE Sensitive     TRIMETH/SULFA <=10 SENSITIVE Sensitive     CLINDAMYCIN RESISTANT Resistant     RIFAMPIN <=0.5 SENSITIVE Sensitive     Inducible Clindamycin POSITIVE Resistant     * ABUNDANT STAPHYLOCOCCUS AUREUS  Aerobic/Anaerobic Culture w Gram Stain (surgical/deep wound)     Status: None (Preliminary result)   Collection Time: 07/11/21  4:36 PM   Specimen: PATH Other; Tissue  Result Value Ref Range Status   Specimen Description   Final    TISSUE RIGHT OLECRANON BURSA FLUID Performed at [place] [address] [place], [place] [address]    Special Requests SWAB  Final   Gram Stain   Final    FEW WBC PRESENT, PREDOMINANTLY PMN FEW GRAM POSITIVE COCCI IN PAIRS Performed at [facility], [place] [address] [place],  [address]    Culture   Final    ABUNDANT STAPHYLOCOCCUS AUREUS NO ANAEROBES ISOLATED; CULTURE IN PROGRESS FOR 5 DAYS    Report Status PENDING  Incomplete   Organism ID, Bacteria STAPHYLOCOCCUS AUREUS  Final      Susceptibility   Staphylococcus aureus - MIC*    CIPROFLOXACIN <=0.5 SENSITIVE Sensitive     ERYTHROMYCIN >=8 RESISTANT Resistant     GENTAMICIN <=0.5 SENSITIVE Sensitive     OXACILLIN 0.5 SENSITIVE Sensitive     TETRACYCLINE <=1 SENSITIVE Sensitive      VANCOMYCIN 1 SENSITIVE Sensitive     TRIMETH/SULFA <=10 SENSITIVE  Sensitive     CLINDAMYCIN RESISTANT Resistant     RIFAMPIN <=0.5 SENSITIVE Sensitive     Inducible Clindamycin POSITIVE Resistant     * ABUNDANT STAPHYLOCOCCUS AUREUS   Time coordinating discharge: 35 minutes  SIGNED:  Kerney Elbe, DO [facility] 07/15/2021, 9:08 AM Pager is on [place]  If 7PM-7AM, please contact night-coverage [email or web address]

## 2021-07-15 NOTE — Plan of Care (Signed)

## 2021-07-15 NOTE — Progress Notes (Signed)
Discharge instructions have been reviewed. Questions, concerns were denied. Megan SW verified [facility] has been set up. Pt remains stable at this time. Dressing clean dry intact. Supplies provided.

## 2021-07-16 LAB — CULTURE, BLOOD (ROUTINE X 2)
Culture: NO GROWTH
Culture: NO GROWTH
Special Requests: ADEQUATE
Special Requests: ADEQUATE

## 2021-07-17 ENCOUNTER — Telehealth: Payer: Self-pay

## 2021-07-17 LAB — AEROBIC/ANAEROBIC CULTURE W GRAM STAIN (SURGICAL/DEEP WOUND)

## 2021-07-17 NOTE — Telephone Encounter (Signed)
Transition Care Management Follow-up Telephone Call Date of discharge and from where: [facility] on 07/15/21 How have you been since you were released from the hospital? No Any questions or concerns? No  Items Reviewed: Did the pt receive and understand the discharge instructions provided? Yes  Medications obtained and verified? Yes  Other? No  Any new allergies since your discharge? No  Dietary orders reviewed? No Do you have support at home? Yes   Home Care and Equipment/Supplies: Were home health services ordered? yes If so, what is the name of the agency? [facility]  Has the agency set up a time to come to the patient's home? yes Were any new equipment or medical supplies ordered?  No What is the name of the medical supply agency? N/A Were you able to get the supplies/equipment? not applicable Do you have any questions related to the use of the equipment or supplies? No  Functional Questionnaire: (I = Independent and D = Dependent) ADLs: I  Bathing/Dressing- I  Meal Prep- I  Eating- I  Maintaining continence- I  Transferring/Ambulation- I  Managing Meds- I  Follow up appointments reviewed:  PCP Hospital f/u appt confirmed? Yes  Scheduled to see Kristian Covey on 07/20/21 @ 12. Specialist Hospital f/u appt confirmed? No   If their condition worsens, is the pt aware to call PCP or go to the Emergency Dept.? Yes Was the patient provided with contact information for the PCP's office or ED? Yes Was to pt encouraged to call back with questions or concerns? Yes

## 2021-07-17 NOTE — Telephone Encounter (Signed)
P.A. [employer]YLE [employer]RE resubmitted to [facility]

## 2021-07-17 NOTE — Telephone Encounter (Signed)
Pt states insurance wouldn't cover.  Will try thru [employer] [facility] [employer]

## 2021-07-20 ENCOUNTER — Telehealth: Payer: Self-pay | Admitting: Internal Medicine

## 2021-07-20 ENCOUNTER — Other Ambulatory Visit: Payer: Self-pay

## 2021-07-20 ENCOUNTER — Ambulatory Visit (INDEPENDENT_AMBULATORY_CARE_PROVIDER_SITE_OTHER): Payer: Medicare Other | Admitting: Medical

## 2021-07-20 VITALS — BP 116/80 | HR 74 | Temp 98.0°F | Wt 137.2 lb

## 2021-07-20 DIAGNOSIS — N1831 Chronic kidney disease, stage 3a: Secondary | ICD-10-CM

## 2021-07-20 DIAGNOSIS — M05742 Rheumatoid arthritis with rheumatoid factor of left hand without organ or systems involvement: Secondary | ICD-10-CM

## 2021-07-20 DIAGNOSIS — M05741 Rheumatoid arthritis with rheumatoid factor of right hand without organ or systems involvement: Secondary | ICD-10-CM

## 2021-07-20 DIAGNOSIS — E0801 Diabetes mellitus due to underlying condition with hyperosmolarity with coma: Secondary | ICD-10-CM | POA: Diagnosis not present

## 2021-07-20 DIAGNOSIS — Z79899 Other long term (current) drug therapy: Secondary | ICD-10-CM

## 2021-07-20 DIAGNOSIS — I5040 Unspecified combined systolic (congestive) and diastolic (congestive) heart failure: Secondary | ICD-10-CM

## 2021-07-20 DIAGNOSIS — M71121 Other infective bursitis, right elbow: Secondary | ICD-10-CM

## 2021-07-20 DIAGNOSIS — Z87891 Personal history of nicotine dependence: Secondary | ICD-10-CM

## 2021-07-20 DIAGNOSIS — Z794 Long term (current) use of insulin: Secondary | ICD-10-CM

## 2021-07-20 DIAGNOSIS — R9431 Abnormal electrocardiogram [ECG] [EKG]: Secondary | ICD-10-CM

## 2021-07-20 MED ORDER — SPIRONOLACTONE 25 MG PO TABS: 25.0000 mg | ORAL_TABLET | Freq: Every day | ORAL | 1 refills | Status: DC

## 2021-07-20 NOTE — Telephone Encounter (Signed)
Charri from [employer] called and wants verbal orders on pt for [facility] for 1 week x 2 days, 2 weeks x2 days and 1 week x 1 day. Emiliano Dyer can be reached at [phone]

## 2021-07-20 NOTE — Telephone Encounter (Signed)
Left detailed message with verbal orders on Charri vm about PT orders

## 2021-07-20 NOTE — Progress Notes (Signed)
Subjective:  Maria Foster is a 65 y.o. female who presents for Chief Complaint  Patient presents with   Hospitalization Follow-up    Hospital follow-up. Having to take antibiotic through IV in arm. She had vitals taken this morning as they have to do it on her leg.      Here for hospital f/u.  She was hospitalized August 22 through July 15, 2021.  Discharge Diagnoses:  Principal Problem:   Septic arthritis of elbow, right (HCC) Active Problems:   Diabetes ([place])   Rheumatoid arthritis involving both hands ([place])   Essential hypertension, benign   Hyperlipidemia associated with type 2 diabetes mellitus (HCC)   Tobacco use   Prolonged QT interval   Stage 3a chronic kidney disease (HCC)   Septic olecranon bursitis of right elbow  Maria Foster has a medical history significant for diabetes mellitus type 2, hypertension, rheumatoid arthritis on Plaquenil, history of chronic mild CHF, CKD stage IIIa, as well as other comorbidities who presented with a right elbow infection following a steroid injection.  She had seen rheumatology and had a steroid injection that subsequently became infected with abscess.  She had nonexcisional debridement of the right elbow at the hospital and excision of rheumatoid nodules.  PICC line was placed and she was started on IV cefazolin to be done for 4 weeks.  At the time of discharge they were trained on how to do this at home.  Her diabetes medicines were held temporarily while in the hospital.  Plaquenil was also held in the setting of active infection.  She has never been on insulin in the past but she was on sliding scale in the hospital setting temporarily.  Given prolonged QT, medication such as PPI and nausea medicines and SSRIs were advised to be avoided.  She notes that she feels overall better since going to the hospital.  She feels comfortable with the antibiotic injection they are doing through the PICC line 3 times a day.  Her husband was trained on  how to do this and he is doing this currently.  Home health nurse is coming out 2 days/week.  She had lab work done yesterday.  She has no fever currently.  She feels fine other than the elbow healing.  She was advised to restart all of her medicines by the time she left the hospital.  She is out of her spironolactone though.  She was advised to start back on Farxiga and Tradjenta which she is doing.  Morning blood sugars running between 115 and 122 in the last few days.  No other aggravating or relieving factors.    No other c/o.  The following portions of the patient's history were reviewed and updated as appropriate: allergies, current medications, past family history, past medical history, past social history, past surgical history and problem list.  ROS Otherwise as in subjective above  Objective: BP 116/80 Comment: patient had it checked before coming here  Pulse 74   Temp 98 F (36.7 C)   Wt 137 lb 3.2 oz (62.2 kg)   BMI 24.30 kg/m   Wt Readings from Last 3 Encounters:  07/20/21 137 lb 3.2 oz (62.2 kg)  07/10/21 132 lb (59.9 kg)  07/03/21 129 lb 12.8 oz (58.9 kg)    General appearance: alert, no distress, well developed, well nourished, African-American female Chronic hoarse voice,  neck: supple, no lymphadenopathy, no thyromegaly, no masses Heart: RRR, normal S1, S2, no murmurs Lungs: CTA bilaterally, no wheezes, rhonchi, or  rales Pulses: 2+ radial pulses, 2+ pedal pulses, normal cap refill Ext: no edema PICC line in place of left arm Right arm elbow region with bandages and a wrap in place    I reviewed labs done through [facility] from 07/18/21.  CBC normal except for neutrophils elevated at 7.1.  Glucose not performed due to cells received in contact with serum making the result and accurate.  Creatinine 1.03 EGFR 60, BUN 14, BUN/creatinine ratio 14, CO2 low at 19, alkaline phosphatase high at 143, C-reactive protein high at 40, otherwise  comprehensive metabolic normal.  Assessment: Encounter Diagnoses  Name Primary?   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  Her white counts are back to normal thankfully.  No worrisome findings.  I reviewed hospital discharge notes, procedure notes, imaging and lab studies.  Medications reconciled.  Recent hospitalization and treatment for Septic olecranon bursitis with subsequent drainage and debridement in the operating room. She has follow-up scheduled already with orthopedics, Dr. Ihor Gully and infectious disease next week.  She will see rheumatology within the next 2 weeks.  Heart failure-continue current medications, refilled spironolactone, follow-up with cardiology as scheduled.  Lasix was held temporarily in the hospital setting but she has resumed this now.  She continues on hydralazine, spironolactone, Entresto and Lasix, metoprolol.  History of prolonged QT interval-stable at the hospital discharge.  Avoid QT prolonging medications such as PPIs, antiemetics, SSRIs.  Rheumatoid arthritis-continue routine follow-up at rheumatology.  She has resumed Plaquenil that was temporarily stopped in the hospital  Diabetes-morning sugars reportedly at goal.  She is back on [place].  I reviewed her recent hemoglobin A1c in the hospital lab notes.  We will see her back in 1 month  Advised tobacco cessation.  Maria Foster was seen today for hospitalization follow-up.  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   Follow up: With orthopedics and infectious disease next week as planned

## 2021-07-26 ENCOUNTER — Ambulatory Visit: Payer: Medicare Other | Admitting: Internal Medicine

## 2021-07-26 ENCOUNTER — Other Ambulatory Visit: Payer: Self-pay

## 2021-07-26 ENCOUNTER — Encounter: Payer: Self-pay | Admitting: Internal Medicine

## 2021-07-26 VITALS — BP 193/80 | HR 66 | Resp 16 | Ht 63.0 in | Wt 137.0 lb

## 2021-07-26 DIAGNOSIS — M00021 Staphylococcal arthritis, right elbow: Secondary | ICD-10-CM

## 2021-07-26 NOTE — Progress Notes (Signed)
[facility]  Patient Active Problem List   Diagnosis Date Noted   Septic arthritis of elbow, right ([place]) 07/11/2021   Prolonged QT interval 07/11/2021   Stage 3a chronic kidney disease ([place]) 07/11/2021   Septic olecranon bursitis of right elbow 07/11/2021   Essential hypertension, benign 05/04/2021   Hyperlipidemia associated with type 2 diabetes mellitus ([place]) 05/04/2021   Enlarged and hypertrophic nails 05/04/2021   Encounter for screening mammogram for malignant neoplasm of breast 05/04/2021   Need for pneumococcal vaccination 05/04/2021   Vaginal itching 05/04/2021   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[place]) 05/04/2021   Stable angina ([place]) 05/04/2021   Vaccine counseling 05/04/2021   Tobacco use 05/04/2021   Weight loss 05/04/2021   High risk medication use 03/29/2021   Iron deficiency anemia 01/27/2021   Screen for colon cancer 01/27/2021   Yeast vaginitis 01/11/2021   Diarrhea 01/11/2021   Former smoker 01/11/2021   Rhinorrhea 01/11/2021   Tophi 01/11/2021   Rheumatoid arthritis involving both hands ([place]) 01/11/2021   Elevated troponin    Acute combined systolic and diastolic heart failure ([place])    Diabetes ([place]) 12/19/2020   Constipation 12/19/2020   AKI (acute kidney injury) ([place]) 12/19/2020      Subjective:    Patient ID: Maria Foster, female    DOB: 06-Jan-1956, 65 y.o.   MRN: [id number]  Cc: f/u hospital discharge/mssa right elbow septic arthritis  HPI:  Maria Foster is a 65 y.o. female with RA, dm2, right elbow steroid injection complicated by septic arthritis  I saw in consultation 8/26 and recommended 4 week abx. Imaging and op finding no concern of osteomyelitis  -------- 9/07 id clinic f/u Doing great Has minimal diarrhea right after each abx infusion No n/v/rash/f/c Right elbow great rom without pain Saw her orthopedic surgeon this morning who took out stitches.  Allergies   Allergen Reactions   Metformin And Related Other (See Comments)    Kidney injury, GI upset, diarrhea      Outpatient Medications Prior to Visit  Medication Sig Dispense Refill   ACCU-CHEK GUIDE test strip See admin instructions.     Accu-Chek Softclix Lancets lancets See admin instructions.     acetaminophen (TYLENOL) 325 MG tablet Take 1-2 tablets (325-650 mg total) by mouth every 6 (six) hours as needed for mild pain (pain score 1-3 or temp > 100.5). 20 tablet 0   aspirin EC 81 MG tablet Take 81 mg by mouth every Monday, Wednesday, and Friday.     atorvastatin (LIPITOR) 40 MG tablet Take 1 tablet (40 mg total) by mouth daily. 90 tablet 3   bisacodyl (DULCOLAX) 5 MG EC tablet Take 1 tablet (5 mg total) by mouth daily as needed for moderate constipation. 30 tablet 0   Blood Glucose Monitoring Suppl (ACCU-CHEK GUIDE ME) w/Device KIT See admin instructions.     ceFAZolin (ANCEF) IVPB Inject 2 g into the vein every 8 (eight) hours for 26 days. Indication:  septic arthritis First Dose: Yes Last Day of Therapy:  08/09/21 Labs - Once weekly:  CBC/D and BMP, Labs - Every other week:  ESR and CRP Method of administration: IV Push Method of administration may be changed at the discretion of home infusion pharmacist based upon assessment of the patient and/or caregiver's ability to self-administer the medication ordered. 78 Units 0   Continuous Blood Gluc Sensor (FREESTYLE LIBRE 14 DAY SENSOR) MISC 1 each by Does not  apply route every 14 (fourteen) days. 2 each 11   dapagliflozin propanediol (FARXIGA) 10 MG TABS tablet Take 1 tablet (10 mg total) by mouth daily before breakfast. 90 tablet 3   docusate sodium (COLACE) 100 MG capsule Take 100 mg by mouth daily as needed for mild constipation.     ferrous gluconate (FERGON) 324 MG tablet Take 1 tablet (324 mg total) by mouth 2 (two) times daily with a meal. 60 tablet 2   furosemide (LASIX) 40 MG tablet Take 0.5 tablets (20 mg total) by mouth daily. 15  tablet 11   hydrALAZINE (APRESOLINE) 50 MG tablet Take 1 tablet (50 mg total) by mouth every 8 (eight) hours. 90 tablet 6   HYDROcodone-acetaminophen (NORCO/VICODIN) 5-325 MG tablet Take 1-2 tablets by mouth every 6 (six) hours as needed for severe pain. 42 tablet 0   hydroxychloroquine (PLAQUENIL) 200 MG tablet Take 1 tablet (200 mg total) by mouth daily. 30 tablet 2   isosorbide mononitrate (IMDUR) 60 MG 24 hr tablet Take 1 tablet (60 mg total) by mouth daily. 30 tablet 11   linagliptin (TRADJENTA) 5 MG TABS tablet Take 1 tablet (5 mg total) by mouth daily. 90 tablet 1   metoprolol succinate (TOPROL-XL) 50 MG 24 hr tablet TAKE 1 TABLET(50 MG) BY MOUTH DAILY WITH OR IMMEDIATELY FOLLOWING A MEAL (Patient taking differently: Take 50 mg by mouth daily.) 30 tablet 5   polyethylene glycol (MIRALAX / GLYCOLAX) 17 g packet Take 17 g by mouth daily as needed for mild constipation. 14 each 0   potassium chloride SA (KLOR-CON) 20 MEQ tablet Take 2 tablets (40 mEq total) by mouth 2 (two) times daily. 120 tablet 3   sacubitril-valsartan (ENTRESTO) 97-103 MG Take 1 tablet by mouth 2 (two) times daily. 60 tablet 6   spironolactone (ALDACTONE) 25 MG tablet Take 1 tablet (25 mg total) by mouth daily. 90 tablet 1   Vitamin D, Ergocalciferol, (DRISDOL) 1.25 MG (50000 UNIT) CAPS capsule Take 50,000 Units by mouth once a week.     No facility-administered medications prior to visit.     Social History   Socioeconomic History   Marital status: Married    Spouse name: Not on file   Number of children: Not on file   Years of education: Not on file   Highest education level: Not on file  Occupational History   Occupation: retired  Tobacco Use   Smoking status: Some Days    Packs/day: 0.50    Years: 15.00    Pack years: 7.50    Types: Cigarettes    Last attempt to quit: 11/19/2020    Years since quitting: 0.6   Smokeless tobacco: Never  Vaping Use   Vaping Use: Never used  Substance and Sexual Activity    Alcohol use: Not Currently   Drug use: No   Sexual activity: Not on file  Other Topics Concern   Not on file  Social History Narrative   Not on file   Social Determinants of Health   Financial Resource Strain: Medium Risk   Difficulty of Paying Living Expenses: Somewhat hard  Food Insecurity: No Food Insecurity   Worried About Charity fundraiser in the Last Year: Never true   Ran Out of Food in the Last Year: Never true  Transportation Needs: No Transportation Needs   Lack of Transportation (Medical): No   Lack of Transportation (Non-Medical): No  Physical Activity: Not on file  Stress: Not on file  Social Connections: Not  on file  Intimate Partner Violence: Not on file      Review of Systems    All other ros negative  Objective:    BP (!) 193/80   Pulse 66   Resp 16   Ht $R'5\' 3"'kg$  (1.6 m)   Wt 137 lb (62.1 kg)   SpO2 98%   BMI 24.27 kg/m  Nursing note and vital signs reviewed.  Physical Exam     General/constitutional: no distress, pleasant HEENT: Normocephalic, PER, Conj Clear, EOMI, Oropharynx clear Neck supple CV: rrr no mrg Lungs: clear to auscultation, normal respiratory effort Abd: Soft, Nontender Ext: no edema Skin: No Rash Neuro: nonfocal MSK: right elbow incision healed -- sutures removed; distal rheumatoid nodules stable; good active rom right elbow   Central line presence: LUE picc site no purulence, tenderness, fluctuance   Labs: Lab Results  Component Value Date   WBC 8.4 07/14/2021   HGB 13.0 07/14/2021   HCT 40.7 07/14/2021   MCV 87.9 07/14/2021   PLT 191 27/04/2375   Last metabolic panel Lab Results  Component Value Date   GLUCOSE 167 (H) 07/14/2021   NA 140 07/14/2021   K 3.4 (L) 07/14/2021   CL 109 07/14/2021   CO2 24 07/14/2021   BUN 19 07/14/2021   CREATININE 1.05 (H) 07/14/2021   GFRNONAA 59 (L) 07/14/2021   GFRAA >60 11/29/2016   CALCIUM 8.5 (L) 07/14/2021   PHOS 2.5 07/14/2021   PROT 6.5 07/14/2021   ALBUMIN  2.8 (L) 07/14/2021   BILITOT 0.7 07/14/2021   ALKPHOS 97 07/14/2021   AST 14 (L) 07/14/2021   ALT 12 07/14/2021   ANIONGAP 7 07/14/2021   [facility]: 9/07 cbc 7/11/202; cr 1.0; lft wnl  Crp: 9/06    12 (<10) Micro:  Serology:  Imaging:  Assessment & Plan:   Problem List Items Addressed This Visit       Musculoskeletal and Integument   Septic arthritis of elbow, right ([place]) - Primary      No orders of the defined types were placed in this encounter.    Abx: 8/23-c vanc   8/23-25 cefepime                                                            Assessment: Right elbow septic arthritis; cx mssa (s tetra/bactrim)  RA   65 yo female pmh RA on plaquenil, dm/htn/hlp, admitted 8/22 for right elbow septic arthritis in setting of steroid injection   She is s/p I&D 8/23. Cx mssa. Bcx negative Reviewed xray and op notes. No concern for osteomyelitis   Would treat for 4 weeks abx with id f/u   -------- 9/7 assessment Clinically doing well Right elbow full active rom today wihtout any tenderness/effusion Labs with crp at 12 (<10)      Plan: Continue cefazolin until 9/21 Stop picc on 9/21 end date of 4 weeks treatment F/u dr Landyn Buckalew in around 4 weeks  Follow-up: Return in about 4 weeks (around 08/23/2021).   I have spent a total of 20 minutes of face-to-face and non-face-to-face time, excluding clinical staff time, preparing to see patient, ordering tests and/or medications, and provide counseling the patient    Jabier Mutton, [facility] [phone]  pager  [phone] cell 07/26/2021, 4:53 PM

## 2021-07-26 NOTE — Patient Instructions (Signed)
You are doing well   Your iv antibiotics (cefazolin) will finish on 9/21, on which date the picc will be removed   Please see our clinic again around 2 weeks after that

## 2021-08-11 ENCOUNTER — Telehealth: Payer: Self-pay | Admitting: Medical

## 2021-08-11 NOTE — Telephone Encounter (Signed)
LVM to schedule AWV with Health Nurse advisor.   

## 2021-08-14 ENCOUNTER — Telehealth: Payer: Self-pay | Admitting: Medical

## 2021-08-14 NOTE — Telephone Encounter (Signed)
Received a call from Fordyce with [facility]. She called to report pt is being discharged from home health PT. Goals have been met.  ALSO reports that pt's bp was high today. It was 190/90. PT stated that she had not taken her meds. She took bp and went down to 170/80. BP was taken again and it was back up.  She took her additional meds a lunch and final bp was 190/90. Charisse wanted Audelia Acton to know.

## 2021-08-15 NOTE — Telephone Encounter (Signed)
Left message for pt to call me back 

## 2021-08-16 NOTE — Telephone Encounter (Signed)
Left message for pt to call me back 

## 2021-08-17 ENCOUNTER — Ambulatory Visit (HOSPITAL_COMMUNITY)
Admission: RE | Admit: 2021-08-17 | Discharge: 2021-08-17 | Disposition: A | Discharge status: Home / Self Care | Payer: Medicare Other | Source: Ambulatory Visit | Attending: Internal Medicine | Admitting: Internal Medicine

## 2021-08-17 ENCOUNTER — Encounter (HOSPITAL_COMMUNITY): Payer: Self-pay | Admitting: Internal Medicine

## 2021-08-17 ENCOUNTER — Other Ambulatory Visit: Payer: Self-pay

## 2021-08-17 VITALS — BP 128/78 | HR 65 | Wt 131.0 lb

## 2021-08-17 DIAGNOSIS — N1831 Chronic kidney disease, stage 3a: Secondary | ICD-10-CM | POA: Diagnosis not present

## 2021-08-17 DIAGNOSIS — Z8249 Family history of ischemic heart disease and other diseases of the circulatory system: Secondary | ICD-10-CM | POA: Insufficient documentation

## 2021-08-17 DIAGNOSIS — Z596 Low income: Secondary | ICD-10-CM | POA: Diagnosis not present

## 2021-08-17 DIAGNOSIS — I10 Essential (primary) hypertension: Secondary | ICD-10-CM

## 2021-08-17 DIAGNOSIS — F17210 Nicotine dependence, cigarettes, uncomplicated: Secondary | ICD-10-CM | POA: Diagnosis not present

## 2021-08-17 DIAGNOSIS — G4733 Obstructive sleep apnea (adult) (pediatric): Secondary | ICD-10-CM

## 2021-08-17 DIAGNOSIS — I429 Cardiomyopathy, unspecified: Secondary | ICD-10-CM | POA: Insufficient documentation

## 2021-08-17 DIAGNOSIS — I2510 Atherosclerotic heart disease of native coronary artery without angina pectoris: Secondary | ICD-10-CM | POA: Insufficient documentation

## 2021-08-17 DIAGNOSIS — I2582 Chronic total occlusion of coronary artery: Secondary | ICD-10-CM | POA: Diagnosis not present

## 2021-08-17 DIAGNOSIS — E669 Obesity, unspecified: Secondary | ICD-10-CM | POA: Insufficient documentation

## 2021-08-17 DIAGNOSIS — I5042 Chronic combined systolic (congestive) and diastolic (congestive) heart failure: Secondary | ICD-10-CM | POA: Insufficient documentation

## 2021-08-17 DIAGNOSIS — G4730 Sleep apnea, unspecified: Secondary | ICD-10-CM | POA: Diagnosis not present

## 2021-08-17 DIAGNOSIS — Z7984 Long term (current) use of oral hypoglycemic drugs: Secondary | ICD-10-CM | POA: Insufficient documentation

## 2021-08-17 DIAGNOSIS — I130 Hypertensive heart and chronic kidney disease with heart failure and stage 1 through stage 4 chronic kidney disease, or unspecified chronic kidney disease: Secondary | ICD-10-CM | POA: Diagnosis not present

## 2021-08-17 DIAGNOSIS — I081 Rheumatic disorders of both mitral and tricuspid valves: Secondary | ICD-10-CM | POA: Insufficient documentation

## 2021-08-17 DIAGNOSIS — I340 Nonrheumatic mitral (valve) insufficiency: Secondary | ICD-10-CM

## 2021-08-17 DIAGNOSIS — E1122 Type 2 diabetes mellitus with diabetic chronic kidney disease: Secondary | ICD-10-CM | POA: Insufficient documentation

## 2021-08-17 DIAGNOSIS — I5082 Biventricular heart failure: Secondary | ICD-10-CM | POA: Insufficient documentation

## 2021-08-17 DIAGNOSIS — Z79899 Other long term (current) drug therapy: Secondary | ICD-10-CM | POA: Diagnosis not present

## 2021-08-17 LAB — BASIC METABOLIC PANEL
Anion gap: 9 (ref 5–15)
BUN: 17 mg/dL (ref 8–23)
CO2: 25 mmol/L (ref 22–32)
Calcium: 9.1 mg/dL (ref 8.9–10.3)
Chloride: 104 mmol/L (ref 98–111)
Creatinine, Ser: 1.03 mg/dL — ABNORMAL HIGH (ref 0.44–1.00)
GFR, Estimated: >60 mL/min (ref >60)
Glucose, Bld: 182 mg/dL — ABNORMAL HIGH (ref 70–99)
Potassium: 4.3 mmol/L (ref 3.5–5.1)
Sodium: 138 mmol/L (ref 135–145)

## 2021-08-17 NOTE — Telephone Encounter (Signed)
Pt will be getting a blood pressure cuff machine to start taking her BP soon.

## 2021-08-17 NOTE — Progress Notes (Signed)
[facility]  PCP: Dr Elonda Husky  Cardiologist: Dr. Oval Linsey HF Cardiologist: Dr. Haroldine Laws  Reason for Visit: F/u for CHF, HFrEF>>HFimEF and Mitral Regurgitation   HPI: Maria Foster is a 65 y.o.  female  with DM2, HTN, obesity, CKD 3a and systolic HF        Admitted 12/19/20 with new onset HF. On admit also hypertensive with AKI. 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 She was diuresed with IV lasix and cardiology was consulted. Discharge weight was 144.5 lbs. Underwent cath 2/22 with 3v CAD with totally occluded RCA with L -> R collaterals, 80% lesion in mLCX and non-obstructive CAD in LAD. EF 40% with elevated filling pressures and normal CO. Given lack of angina, she was treated medically. 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  OV 3/22, was doing well but out of Entresto. Rx refilled and spiro increased to 29m daily.    Had return f/u 6/22. Echo showed improved EF up to 55%. 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 BP was elevated in the setting of running out of hydralazine and spiro. She was stable from symptom standpoint, NYHA Class II. Hydral and spiro were refilled and Entresto increased to 97-103 bid. Referral was placed to be seen in the HTN clinic. It appears they attempted to contact her for appt but was unsuccessfull in reaching the patient.   [facility] septic arthritis involving the rt elbow. This was after receiving steroid injection. Synovial fluid cx grew staph aureus. Blood cultures negative. Taken for washout and placed on IV abx x 4 weeks.   She returns today for routine f/u. Doing better. Finished IV abx last week. Elbow improved. Denies fever and chills. Feels well from HF standpoint. Denies CP. Does ok w/ ADLs. NYHA class II. BP much improved, 128/78. No wt gain or LEE.      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 Personally reviewed   R/LHC (2/22): Ost RCA to Prox RCA lesion is 100% stenosed. Mid Cx lesion is 80% stenosed. 1st Mrg lesion is 100% stenosed. Ramus lesion is 30% stenosed. Mid LM to Dist LM lesion is 20% stenosed. Prox LAD lesion is 30% stenosed. Dist RCA lesion is 100% stenosed. Findings: Ao = 154.73 (104) LV = 143/20 RA =  13 RV = 65/15 PA = 60/24 (37)  PCW = 27 (v = 40) Fick cardiac output/index = 4.7/2.7 PVR = 2.1 WU SVR = 1552 Ao sat = 97% PA sat = 64%, 64% Assessment: 1. 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 80% lesion in mLCX 3. Mild non-obstructive CAD in LAD 4. 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 Treat medically for now. If develops anginal symptoms can consider PCI mLCX. Needs diuresis first.   ROS: All systems negative except as listed in HPI, PMH and Problem List.  SH:  Social History   Socioeconomic History   Marital status: Married    Spouse name: Not on file   Number of children: Not on file   Years of education: Not  on file   Highest education level: Not on file  Occupational History   Occupation: retired  Tobacco Use   Smoking status: Some Days    Packs/day: 0.50    Years: 15.00    Pack years: 7.50    Types: Cigarettes    Last attempt to quit: 11/19/2020    Years since quitting: 0.7   Smokeless tobacco: Never  Vaping Use   Vaping Use: Never used  Substance and Sexual Activity   Alcohol use: Not Currently   Drug use: No   Sexual activity: Not on file  Other Topics Concern   Not on file  Social History Narrative   Not on file   Social Determinants of Health   Financial Resource Strain: Medium Risk    Difficulty of Paying Living Expenses: Somewhat hard  Food Insecurity: No Food Insecurity   Worried About Charity fundraiser in the Last Year: Never true   Ran Out of Food in the Last Year: Never true  Transportation Needs: No Transportation Needs   Lack of Transportation (Medical): No   Lack of Transportation (Non-Medical): No  Physical Activity: Not on file  Stress: Not on file  Social Connections: Not on file  Intimate Partner Violence: Not on file   FH:  Family History  Problem Relation Age of Onset   Diabetes Mother    Hypertension Mother    Heart Problems Sister    Amblyopia Neg Hx    Blindness Neg Hx    Cataracts Neg Hx    Glaucoma Neg Hx    Macular degeneration Neg Hx    Retinal detachment Neg Hx    Strabismus Neg Hx    Retinitis pigmentosa Neg Hx    Past Medical History:  Diagnosis Date   Arthritis    Cataract    Diabetes mellitus type 2 with complications ([place])    diagnosed probably 2012 per patient   Diabetic retinopathy ([place])    Hypertension    Rheumatoid arthritis ([place])    Current Outpatient Medications  Medication Sig Dispense Refill   ACCU-CHEK GUIDE test strip See admin instructions.     Accu-Chek Softclix Lancets lancets See admin instructions.     acetaminophen (TYLENOL) 325 MG tablet Take 1-2 tablets (325-650 mg total) by mouth every 6 (six) hours as needed for mild pain (pain score 1-3 or temp > 100.5). 20 tablet 0   aspirin EC 81 MG tablet Take 81 mg by mouth every Monday, Wednesday, and Friday.     atorvastatin (LIPITOR) 40 MG tablet Take 1 tablet (40 mg total) by mouth daily. 90 tablet 3   bisacodyl (DULCOLAX) 5 MG EC tablet Take 1 tablet (5 mg total) by mouth daily as needed for moderate constipation. 30 tablet 0   Blood Glucose Monitoring Suppl (ACCU-CHEK GUIDE ME) w/Device KIT See admin instructions.     Continuous Blood Gluc Sensor (FREESTYLE LIBRE 14 DAY SENSOR) MISC 1 each by Does not apply route every 14 (fourteen) days. 2 each 11    dapagliflozin propanediol (FARXIGA) 10 MG TABS tablet Take 1 tablet (10 mg total) by mouth daily before breakfast. 90 tablet 3   docusate sodium (COLACE) 100 MG capsule Take 100 mg by mouth daily as needed for mild constipation.     ferrous gluconate (FERGON) 324 MG tablet Take 1 tablet (324 mg total) by mouth 2 (two) times daily with a meal. 60 tablet 2   furosemide (LASIX) 40 MG tablet Take 0.5 tablets (20 mg total) by  mouth daily. 15 tablet 11   hydrALAZINE (APRESOLINE) 50 MG tablet Take 1 tablet (50 mg total) by mouth every 8 (eight) hours. 90 tablet 6   HYDROcodone-acetaminophen (NORCO/VICODIN) 5-325 MG tablet Take 1-2 tablets by mouth every 6 (six) hours as needed for severe pain. 42 tablet 0   hydroxychloroquine (PLAQUENIL) 200 MG tablet Take 1 tablet (200 mg total) by mouth daily. 30 tablet 2   isosorbide mononitrate (IMDUR) 60 MG 24 hr tablet Take 1 tablet (60 mg total) by mouth daily. 30 tablet 11   linagliptin (TRADJENTA) 5 MG TABS tablet Take 1 tablet (5 mg total) by mouth daily. 90 tablet 1   metoprolol succinate (TOPROL-XL) 50 MG 24 hr tablet TAKE 1 TABLET(50 MG) BY MOUTH DAILY WITH OR IMMEDIATELY FOLLOWING A MEAL 30 tablet 5   polyethylene glycol (MIRALAX / GLYCOLAX) 17 g packet Take 17 g by mouth daily as needed for mild constipation. 14 each 0   potassium chloride SA (KLOR-CON) 20 MEQ tablet Take 2 tablets (40 mEq total) by mouth 2 (two) times daily. 120 tablet 3   sacubitril-valsartan (ENTRESTO) 97-103 MG Take 1 tablet by mouth 2 (two) times daily. 60 tablet 6   spironolactone (ALDACTONE) 25 MG tablet Take 1 tablet (25 mg total) by mouth daily. 90 tablet 1   Vitamin D, Ergocalciferol, (DRISDOL) 1.25 MG (50000 UNIT) CAPS capsule Take 50,000 Units by mouth once a week.     No current facility-administered medications for this encounter.   Vitals:   08/17/21 1240  BP: 128/78  Pulse: 65  SpO2: 100%  Weight: 59.4 kg (131 lb)    Wt Readings from Last 3 Encounters:  08/17/21  59.4 kg (131 lb)  07/26/21 62.1 kg (137 lb)  07/20/21 62.2 kg (137 lb 3.2 oz)   PHYSICAL EXAM: General:  Well appearing. No respiratory difficulty HEENT: normal Neck: supple. no JVD. Carotids 2+ bilat; no bruits. No lymphadenopathy or thyromegaly appreciated. Cor: PMI nondisplaced. Regular rate & rhythm. No rubs, gallops or murmurs. Lungs: clear Abdomen: soft, nontender, nondistended. No hepatosplenomegaly. No bruits or masses. Good bowel sounds. Extremities: no cyanosis, clubbing, rash, edema + RA and gout nodules bilateral hands and elbows  Neuro: alert & oriented x 3, cranial nerves grossly intact. moves all 4 extremities w/o difficulty. Affect pleasant.   ASSESSMENT & PLAN:  1. 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  - NYHA II. Volume status stable.  - Continue spiro to 25 mg daily.  - Continue Toprol XL 50 mg daily. - Continue hydralazine 50 mg tid + Imdur 60 mg daily. - Continue Farxiga 10 mg daily. - Continue lasix 40 mg daily. - Continue Entresto to 97/159m twice a day.  - Check BMP today   2. CAD  - R/LHC (2/22) 3v CAD with totally occluded RCA with L -> R collaterals, 80% lesion in mLCX, mild non-obs in LAD - Stable w/o ischemic CP  - On ASA.  - Continue atorvastatin 40 mg daily.   3. 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 If MR still significant will need TEE with possible eye  toward MVR/CABG.  4. T2DM: -a1c 04/2021 7.0 -On tradjenta + Farxiga -recent yeast infection but resolved with treatment.  -Continue farxiga.   5. CKD3a: - Baseline Scr~1.2-1.5 - BMP today   6. Severe  Sleep Apnea . - AHI 60/hr. Suspect severe OSA contributing to HTN  - unable to tolerate CPAP. Would she be candidate for Inspire device?  - Follows with Dr Radford Pax.   7. HTN - well controlled on current regimen - BMP today   8. Tobacco Use -has cut back significantly 1-2 cigs per day but not smoking every day - cessation advised.   F/u w/ APP after echo in 4 weeks.  Lyda Jester, PA-C  08/17/21   Patient seen and examined with the above-signed Advanced Practice Provider and/or Housestaff. I personally reviewed laboratory data, imaging studies and relevant notes. I independently examined the patient and formulated the important aspects of the plan. I have edited the note to reflect any of my changes or salient points. I have personally discussed the plan with the patient and/or family.  Feels ok. Recently s/p I&D of infected gout nodule on her elbow. Denies SOB, orthopnea or PND. BP much improved  General:  Well appearing. No resp difficulty HEENT: normal Neck: supple. no JVD. Carotids 2+ bilat; no bruits. No lymphadenopathy or thryomegaly appreciated. Cor: PMI nondisplaced. Regular rate & rhythm. No rubs, gallops or murmurs. Lungs: decreased throughout Abdomen: soft, nontender, nondistended. No hepatosplenomegaly. No bruits or masses. Good bowel sounds. Extremities: no cyanosis, clubbing, rash, edema diffuse gouty nodules Neuro: alert & orientedx3, cranial nerves grossly intact. moves all 4 extremities w/o difficulty. Affect pleasant  BP is much improved. EF normalized. Will repeat echo to re-evaluate degree of MR with her rheumatic valve. If MR still significant on TTE will schedule TEE. Encouraged smoking cessation. Labs today.   Glori Bickers, MD  2:35 PM

## 2021-08-17 NOTE — Patient Instructions (Signed)
Labs done today, we will call you for abnormal results  Your physician has requested that you have an echocardiogram. Echocardiography is a painless test that uses sound waves to create images of your heart. It provides your doctor with information about the size and shape of your heart and how well your heart's chambers and valves are working. This procedure takes approximately one hour. There are no restrictions for this procedure.  Your physician recommends that you schedule a follow-up appointment in: 4 weeks  Do the following things EVERYDAY: Weigh yourself in the morning before breakfast. Write it down and keep it in a log. Take your medicines as prescribed Eat low salt foods--Limit salt (sodium) to 2000 mg per day.  Stay as active as you can everyday Limit all fluids for the day to less than 2 liters  If you have any questions or concerns before your next appointment please send [place] a message through [place] or call our office at [phone].    TO LEAVE A MESSAGE FOR THE NURSE SELECT OPTION 2, PLEASE LEAVE A MESSAGE INCLUDING: YOUR NAME DATE OF BIRTH CALL BACK NUMBER REASON FOR CALL**this is important as we prioritize the call backs  YOU WILL RECEIVE A CALL BACK THE SAME DAY AS LONG AS YOU CALL BEFORE 4:00 PM  At the [facility], you and your health needs are our priority. As part of our continuing mission to provide you with exceptional heart care, we have created designated Provider Care Teams. These Care Teams include your primary Cardiologist (physician) and Advanced Practice Providers (APPs- Physician Assistants and Nurse Practitioners) who all work together to provide you with the care you need, when you need it.   You may see any of the following providers on your designated Care Team at your next follow up: Dr Arvilla Meres Dr Marca Ancona Dr Brandon Melnick, NP Robbie Lis, [place] Mikki Santee [name], PharmD   Please be  sure to bring in all your medications bottles to every appointment.

## 2021-08-22 ENCOUNTER — Other Ambulatory Visit (HOSPITAL_COMMUNITY): Payer: Self-pay

## 2021-08-22 DIAGNOSIS — I340 Nonrheumatic mitral (valve) insufficiency: Secondary | ICD-10-CM

## 2021-08-25 ENCOUNTER — Other Ambulatory Visit: Payer: Self-pay

## 2021-08-25 ENCOUNTER — Ambulatory Visit (INDEPENDENT_AMBULATORY_CARE_PROVIDER_SITE_OTHER): Payer: Medicare Other | Admitting: Medical

## 2021-08-25 VITALS — BP 130/78 | HR 68 | Wt 135.0 lb

## 2021-08-25 DIAGNOSIS — G4733 Obstructive sleep apnea (adult) (pediatric): Secondary | ICD-10-CM

## 2021-08-25 DIAGNOSIS — E611 Iron deficiency: Secondary | ICD-10-CM

## 2021-08-25 DIAGNOSIS — E785 Hyperlipidemia, unspecified: Secondary | ICD-10-CM

## 2021-08-25 DIAGNOSIS — I10 Essential (primary) hypertension: Secondary | ICD-10-CM

## 2021-08-25 DIAGNOSIS — I5040 Unspecified combined systolic (congestive) and diastolic (congestive) heart failure: Secondary | ICD-10-CM

## 2021-08-25 DIAGNOSIS — M05742 Rheumatoid arthritis with rheumatoid factor of left hand without organ or systems involvement: Secondary | ICD-10-CM

## 2021-08-25 DIAGNOSIS — Z129 Encounter for screening for malignant neoplasm, site unspecified: Secondary | ICD-10-CM | POA: Insufficient documentation

## 2021-08-25 DIAGNOSIS — Z2821 Immunization not carried out because of patient refusal: Secondary | ICD-10-CM

## 2021-08-25 DIAGNOSIS — M05741 Rheumatoid arthritis with rheumatoid factor of right hand without organ or systems involvement: Secondary | ICD-10-CM

## 2021-08-25 DIAGNOSIS — E1169 Type 2 diabetes mellitus with other specified complication: Secondary | ICD-10-CM

## 2021-08-25 DIAGNOSIS — Z1211 Encounter for screening for malignant neoplasm of colon: Secondary | ICD-10-CM | POA: Diagnosis not present

## 2021-08-25 DIAGNOSIS — E0801 Diabetes mellitus due to underlying condition with hyperosmolarity with coma: Secondary | ICD-10-CM | POA: Diagnosis not present

## 2021-08-25 DIAGNOSIS — Z794 Long term (current) use of insulin: Secondary | ICD-10-CM

## 2021-08-25 DIAGNOSIS — N1831 Chronic kidney disease, stage 3a: Secondary | ICD-10-CM

## 2021-08-25 NOTE — Progress Notes (Signed)
Subjective:  Maria Foster is a 65 y.o. female who presents for Chief Complaint  Patient presents with   1 month follow-up    1 month follow-up      Here for med check.  I saw her a month ago for hospital follow-up.  In the last month she has been doing okay, no particular concerns.  Diabetes-she notes blood sugars are looking fine at home.  No real highs and lows.  She is compliant with [place] and [place].  She still gets a little vaginal irritation from the [place].  No polydipsia, no vision change or other concerns  She just saw cardiology recently for follow-up.  No meds were changed at that time.  History of sleep apnea without current therapy.  She denies when she was told about looking in the CPAP she had just gotten out of the hospital and had too much going on.  She may be agreeable to trying CPAP  Home health nurse has been coming out to the house since her hospitalization but they just finished recently.  The therapist did help her with some mobility and stretching.  Compliant with medications for blood pressure, heart failure and cholesterol.  She declines flu shot.  No other aggravating or relieving factors.    No other c/o.  Past Medical History:  Diagnosis Date   Arthritis    Cataract    Diabetes mellitus type 2 with complications ([place])    diagnosed probably 2012 per patient   Diabetic retinopathy ([place])    Hypertension    Rheumatoid arthritis ([place])    Current Outpatient Medications on File Prior to Visit  Medication Sig Dispense Refill   ACCU-CHEK GUIDE test strip See admin instructions.     Accu-Chek Softclix Lancets lancets See admin instructions.     acetaminophen (TYLENOL) 325 MG tablet Take 1-2 tablets (325-650 mg total) by mouth every 6 (six) hours as needed for mild pain (pain score 1-3 or temp > 100.5). 20 tablet 0   aspirin EC 81 MG tablet Take 81 mg by mouth every Monday, Wednesday, and Friday.     atorvastatin (LIPITOR) 40 MG tablet Take 1  tablet (40 mg total) by mouth daily. 90 tablet 3   bisacodyl (DULCOLAX) 5 MG EC tablet Take 1 tablet (5 mg total) by mouth daily as needed for moderate constipation. 30 tablet 0   Blood Glucose Monitoring Suppl (ACCU-CHEK GUIDE ME) w/Device KIT See admin instructions.     Continuous Blood Gluc Sensor (FREESTYLE LIBRE 14 DAY SENSOR) MISC 1 each by Does not apply route every 14 (fourteen) days. 2 each 11   dapagliflozin propanediol (FARXIGA) 10 MG TABS tablet Take 1 tablet (10 mg total) by mouth daily before breakfast. 90 tablet 3   docusate sodium (COLACE) 100 MG capsule Take 100 mg by mouth daily as needed for mild constipation.     ferrous gluconate (FERGON) 324 MG tablet Take 1 tablet (324 mg total) by mouth 2 (two) times daily with a meal. 60 tablet 2   furosemide (LASIX) 40 MG tablet Take 0.5 tablets (20 mg total) by mouth daily. 15 tablet 11   hydrALAZINE (APRESOLINE) 50 MG tablet Take 1 tablet (50 mg total) by mouth every 8 (eight) hours. 90 tablet 6   HYDROcodone-acetaminophen (NORCO/VICODIN) 5-325 MG tablet Take 1-2 tablets by mouth every 6 (six) hours as needed for severe pain. 42 tablet 0   hydroxychloroquine (PLAQUENIL) 200 MG tablet Take 1 tablet (200 mg total) by mouth daily. [place]  tablet 2   isosorbide mononitrate (IMDUR) 60 MG 24 hr tablet Take 1 tablet (60 mg total) by mouth daily. 30 tablet 11   linagliptin (TRADJENTA) 5 MG TABS tablet Take 1 tablet (5 mg total) by mouth daily. 90 tablet 1   metoprolol succinate (TOPROL-XL) 50 MG 24 hr tablet TAKE 1 TABLET(50 MG) BY MOUTH DAILY WITH OR IMMEDIATELY FOLLOWING A MEAL 30 tablet 5   polyethylene glycol (MIRALAX / GLYCOLAX) 17 g packet Take 17 g by mouth daily as needed for mild constipation. 14 each 0   potassium chloride SA (KLOR-CON) 20 MEQ tablet Take 2 tablets (40 mEq total) by mouth 2 (two) times daily. 120 tablet 3   sacubitril-valsartan (ENTRESTO) 97-103 MG Take 1 tablet by mouth 2 (two) times daily. 60 tablet 6   spironolactone  (ALDACTONE) 25 MG tablet Take 1 tablet (25 mg total) by mouth daily. 90 tablet 1   Vitamin D, Ergocalciferol, (DRISDOL) 1.25 MG (50000 UNIT) CAPS capsule Take 50,000 Units by mouth once a week.     No current facility-administered medications on file prior to visit.     The following portions of the patient's history were reviewed and updated as appropriate: allergies, current medications, past family history, past medical history, past social history, past surgical history and problem list.  ROS Otherwise as in subjective above  Objective: BP 130/78   Pulse 68   Wt 135 lb (61.2 kg)   BMI 23.91 kg/m   General appearance: alert, no distress, well developed, well nourished Neck: supple, no lymphadenopathy, no thyromegaly, no masses, no JVD Heart: RRR, normal S1, S2, no murmurs Lungs: CTA bilaterally, no wheezes, rhonchi, or rales Pulses: 2+ radial pulses, 1+ pedal pulses, normal cap refill Ext: no edema   Assessment: Encounter Diagnoses  Name Primary?   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  We discussed options for therapy.  She did not like the idea of inspire.  She is due for cancer screening.  She was agreeable for referral to colon cancer screening.  She has been hesitant for this in the past.  Advise she return for Pap smear in the near future  Iron deficiency-compliant with iron  twice daily, recheck labs today.  Diabetes-6.7 % hemoglobin A1c today.  Continue current medications Tradjenta and Farxiga.  Hypertension-continue current medications  Hyperlipidemia-continue current medications  Chronic heart failure-sees cardiology  Rheumatoid arthritis-sees rheumatoid specialist  I completed a handicap placard application for her today   Maria Foster was seen today for 1 month follow-up.  Diagnoses and all orders for this visit:  Diabetes mellitus due to underlying condition with hyperosmolarity and coma, with long-term current use of insulin (HCC)  OSA (obstructive sleep apnea) -     For home use only DME continuous positive airway pressure (CPAP)  Screen for colon cancer -     Ambulatory referral to Gastroenterology  Iron  deficiency -     Iron, TIBC and Ferritin Panel  Screening for cancer  Stage 3a chronic kidney disease (HCC)  Combined systolic and diastolic congestive heart failure, unspecified HF chronicity (HCC)  Essential hypertension, benign  Hyperlipidemia associated with type 2 diabetes mellitus (HCC)  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

## 2021-08-26 LAB — IRON,TIBC AND FERRITIN PANEL
Ferritin: 79 ng/mL (ref 15–150)
Iron Saturation: 16 % (ref 15–55)
Iron: 41 ug/dL (ref 27–139)
Total Iron Binding Capacity: 255 ug/dL (ref 250–450)
UIBC: 214 ug/dL (ref 118–369)

## 2021-08-27 ENCOUNTER — Other Ambulatory Visit: Payer: Self-pay | Admitting: Medical

## 2021-08-27 MED ORDER — FERROUS GLUCONATE 324 (38 FE) MG PO TABS: 324.0000 mg | ORAL_TABLET | Freq: Two times a day (BID) | ORAL | 1 refills | Status: DC

## 2021-08-30 ENCOUNTER — Ambulatory Visit: Payer: Medicare Other | Admitting: Internal Medicine

## 2021-08-30 ENCOUNTER — Encounter: Payer: Self-pay | Admitting: Internal Medicine

## 2021-08-30 ENCOUNTER — Other Ambulatory Visit: Payer: Self-pay

## 2021-08-30 VITALS — BP 206/68 | HR 60 | Wt 134.6 lb

## 2021-08-30 DIAGNOSIS — M00021 Staphylococcal arthritis, right elbow: Secondary | ICD-10-CM | POA: Diagnosis not present

## 2021-08-30 NOTE — Progress Notes (Signed)
[facility] Infectious Disease  Patient Active Problem List   Diagnosis Date Noted   OSA (obstructive sleep apnea) 08/25/2021   Iron deficiency 08/25/2021   Screening for cancer 08/25/2021   Influenza vaccination declined 08/25/2021   Septic arthritis of elbow, right ([place]) 07/11/2021   Prolonged QT interval 07/11/2021   Stage 3a chronic kidney disease ([place]) 07/11/2021   Septic olecranon bursitis of right elbow 07/11/2021   Essential hypertension, benign 05/04/2021   Hyperlipidemia associated with type 2 diabetes mellitus ([place]) 05/04/2021   Enlarged and hypertrophic nails 05/04/2021   Encounter for screening mammogram for malignant neoplasm of breast 05/04/2021   Need for pneumococcal vaccination 05/04/2021   Vaginal itching 05/04/2021   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[place]) 05/04/2021   Stable angina ([place]) 05/04/2021   Vaccine counseling 05/04/2021   Tobacco use 05/04/2021   Weight loss 05/04/2021   High risk medication use 03/29/2021   Screen for colon cancer 01/27/2021   Yeast vaginitis 01/11/2021   Former smoker 01/11/2021   Rhinorrhea 01/11/2021   Tophi 01/11/2021   Rheumatoid arthritis involving both hands ([place]) 01/11/2021   Elevated troponin    Diabetes ([place]) 12/19/2020   Constipation 12/19/2020      Subjective:    Patient ID: Maria Foster, female    DOB: December 12, 1955, 65 y.o.   MRN: [id number]  Cc: f/u hospital discharge/mssa right elbow septic arthritis  HPI:  Maria Foster is a 65 y.o. female with RA, dm2, right elbow steroid injection complicated by septic arthritis  I saw in consultation 8/26 and recommended 4 week abx. Imaging and op finding no concern of osteomyelitis  -------- 9/07 id clinic f/u Doing great Has minimal diarrhea right after each abx infusion No n/v/rash/f/c Right elbow great rom without pain Saw her orthopedic surgeon this morning who took out stitches.  10/12 id f/u No pain right  elbow Felt well/normal No f/c Happy with where things are Finished iv abx 9/21  Allergies  Allergen Reactions   Metformin And Related Other (See Comments)    Kidney injury, GI upset, diarrhea      Outpatient Medications Prior to Visit  Medication Sig Dispense Refill   ACCU-CHEK GUIDE test strip See admin instructions.     Accu-Chek Softclix Lancets lancets See admin instructions.     acetaminophen (TYLENOL) 325 MG tablet Take 1-2 tablets (325-650 mg total) by mouth every 6 (six) hours as needed for mild pain (pain score 1-3 or temp > 100.5). 20 tablet 0   aspirin EC 81 MG tablet Take 81 mg by mouth every Monday, Wednesday, and Friday.     atorvastatin (LIPITOR) 40 MG tablet Take 1 tablet (40 mg total) by mouth daily. 90 tablet 3   bisacodyl (DULCOLAX) 5 MG EC tablet Take 1 tablet (5 mg total) by mouth daily as needed for moderate constipation. 30 tablet 0   Blood Glucose Monitoring Suppl (ACCU-CHEK GUIDE ME) w/Device KIT See admin instructions.     Continuous Blood Gluc Sensor (FREESTYLE LIBRE 14 DAY SENSOR) MISC 1 each by Does not apply route every 14 (fourteen) days. 2 each 11   dapagliflozin propanediol (FARXIGA) 10 MG TABS tablet Take 1 tablet (10 mg total) by mouth daily before breakfast. 90 tablet 3   docusate sodium (COLACE) 100 MG capsule Take 100 mg by mouth daily as needed for mild constipation.     ferrous gluconate (FERGON) 324 MG tablet Take 1 tablet (324 mg total)  by mouth 2 (two) times daily with a meal. 180 tablet 1   furosemide (LASIX) 40 MG tablet Take 0.5 tablets (20 mg total) by mouth daily. 15 tablet 11   hydrALAZINE (APRESOLINE) 50 MG tablet Take 1 tablet (50 mg total) by mouth every 8 (eight) hours. 90 tablet 6   HYDROcodone-acetaminophen (NORCO/VICODIN) 5-325 MG tablet Take 1-2 tablets by mouth every 6 (six) hours as needed for severe pain. 42 tablet 0   hydroxychloroquine (PLAQUENIL) 200 MG tablet Take 1 tablet (200 mg total) by mouth daily. 30 tablet 2    isosorbide mononitrate (IMDUR) 60 MG 24 hr tablet Take 1 tablet (60 mg total) by mouth daily. 30 tablet 11   linagliptin (TRADJENTA) 5 MG TABS tablet Take 1 tablet (5 mg total) by mouth daily. 90 tablet 1   metoprolol succinate (TOPROL-XL) 50 MG 24 hr tablet TAKE 1 TABLET(50 MG) BY MOUTH DAILY WITH OR IMMEDIATELY FOLLOWING A MEAL 30 tablet 5   polyethylene glycol (MIRALAX / GLYCOLAX) 17 g packet Take 17 g by mouth daily as needed for mild constipation. 14 each 0   potassium chloride SA (KLOR-CON) 20 MEQ tablet Take 2 tablets (40 mEq total) by mouth 2 (two) times daily. 120 tablet 3   sacubitril-valsartan (ENTRESTO) 97-103 MG Take 1 tablet by mouth 2 (two) times daily. 60 tablet 6   spironolactone (ALDACTONE) 25 MG tablet Take 1 tablet (25 mg total) by mouth daily. 90 tablet 1   Vitamin D, Ergocalciferol, (DRISDOL) 1.25 MG (50000 UNIT) CAPS capsule Take 50,000 Units by mouth once a week.     No facility-administered medications prior to visit.     Social History   Socioeconomic History   Marital status: Married    Spouse name: Not on file   Number of children: Not on file   Years of education: Not on file   Highest education level: Not on file  Occupational History   Occupation: retired  Tobacco Use   Smoking status: Some Days    Packs/day: 0.50    Years: 15.00    Pack years: 7.50    Types: Cigarettes    Last attempt to quit: 11/19/2020    Years since quitting: 0.7   Smokeless tobacco: Never  Vaping Use   Vaping Use: Never used  Substance and Sexual Activity   Alcohol use: Not Currently   Drug use: No   Sexual activity: Not on file  Other Topics Concern   Not on file  Social History Narrative   Not on file   Social Determinants of Health   Financial Resource Strain: Medium Risk   Difficulty of Paying Living Expenses: Somewhat hard  Food Insecurity: No Food Insecurity   Worried About Charity fundraiser in the Last Year: Never true   Ran Out of Food in the Last Year:  Never true  Transportation Needs: No Transportation Needs   Lack of Transportation (Medical): No   Lack of Transportation (Non-Medical): No  Physical Activity: Not on file  Stress: Not on file  Social Connections: Not on file  Intimate Partner Violence: Not on file      Review of Systems    All other ros negative  Objective:    BP (!) 213/81   Pulse 60   Wt 134 lb 9.6 oz (61.1 kg)   BMI 23.84 kg/m  Nursing note and vital signs reviewed.  Physical Exam     General/constitutional: no distress, pleasant HEENT: Normocephalic, PER, Conj Clear, EOMI, Oropharynx clear  Neck supple CV: rrr no mrg Lungs: clear to auscultation, normal respiratory effort Abd: Soft, Nontender Ext: no edema Skin: No Rash Neuro: nonfocal MSK: no peripheral joint swelling/tenderness/warmth; back spines nontender. Right elbow incision healed no tenderness/swelling/redness/warmth   Labs: Lab Results  Component Value Date   WBC 8.4 07/14/2021   HGB 13.0 07/14/2021   HCT 40.7 07/14/2021   MCV 87.9 07/14/2021   PLT 191 70/17/7939   Last metabolic panel Lab Results  Component Value Date   GLUCOSE 182 (H) 08/17/2021   NA 138 08/17/2021   K 4.3 08/17/2021   CL 104 08/17/2021   CO2 25 08/17/2021   BUN 17 08/17/2021   CREATININE 1.03 (H) 08/17/2021   GFRNONAA >60 08/17/2021   GFRAA >60 11/29/2016   CALCIUM 9.1 08/17/2021   PHOS 2.5 07/14/2021   PROT 6.5 07/14/2021   ALBUMIN 2.8 (L) 07/14/2021   BILITOT 0.7 07/14/2021   ALKPHOS 97 07/14/2021   AST 14 (L) 07/14/2021   ALT 12 07/14/2021   ANIONGAP 9 08/17/2021   [facility]: 9/07 cbc 7/11/202; cr 1.0; lft wnl  Crp: 9/06    12 (<10) Micro:  Serology:  Imaging:  Assessment & Plan:   Problem List Items Addressed This Visit       Musculoskeletal and Integument   Septic arthritis of elbow, right ([place]) - Primary     No orders of the defined types were placed in this encounter.    Abx: 8/26-9/21 cefazolin 8/23-8/26 vanc    8/23-25 cefepime                                                            Assessment: Right elbow septic arthritis; cx mssa (s tetra/bactrim)  RA   65 yo female pmh RA on plaquenil, dm/htn/hlp, admitted 8/22 for right elbow septic arthritis in setting of steroid injection   She is s/p I&D 8/23. Cx mssa. Bcx negative Reviewed xray and op notes. No concern for osteomyelitis   Would treat for 4 weeks abx with id f/u   -------- 9/7 assessment Clinically doing well Right elbow full active rom today wihtout any tenderness/effusion Labs with crp at 12 (<10)  10/12 assessment S/p 4 weeks iv abx on 9/21 Clinically right elbow looks great without evidence of infection/inflammation Discharge from id clinic S/s of recurrent infection discussed and patient to f/u as needed   Follow-up: Return if symptoms worsen or fail to improve.   I have spent a total of 20 minutes of face-to-face and non-face-to-face time, excluding clinical staff time, preparing to see patient, ordering tests and/or medications, and provide counseling the patient    Jabier Mutton, [place] for [place] [phone]  pager   [phone] cell 08/30/2021, 3:51 PM

## 2021-08-30 NOTE — Patient Instructions (Signed)
Your infection is gone   Please continue for the next few weeks to monitor sign of infection like pain, swelling, warmth, redness, fever/chill   If those occur call our clinic sooner than later

## 2021-09-18 NOTE — Progress Notes (Signed)
[facility]  PCP: Dr Elonda Husky  Cardiologist: Dr. Oval Linsey HF Cardiologist: Dr. Haroldine Laws  Reason for Visit: Heart Failure   HPI: Maria Foster is a 65 y.o.  female  with DM2, HTN, obesity, CKD 3a , CAD, MR, and systolic HF        Admitted 12/19/20 with new onset HF. On admit also hypertensive with AKI. 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 She was diuresed with IV lasix and cardiology was consulted. Discharge weight was 144.5 lbs. Underwent cath 2/22 with 3v CAD with totally occluded RCA with L -> R collaterals, 80% lesion in mLCX and non-obstructive CAD in LAD. EF 40% with elevated filling pressures and normal CO. Given lack of angina, she was treated medically. 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  Had return f/u 6/22. Echo showed improved EF up to 55%. 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 BP was elevated in the setting of running out of hydralazine and spiro. She was stable from symptom standpoint, NYHA Class II. Hydral and spiro were refilled and Entresto increased to 97-103 bid. Referral was placed to be seen in the HTN clinic. It appears they attempted to contact her for appt but was unsuccessfull in reaching the patient.   She was admitted to [facility] 8/22-8/27 for septic arthritis involving the rt elbow. This was after receiving steroid injection. Synovial fluid cx grew staph aureus. Blood cultures negative. Taken for washout and placed on IV abx x 4 weeks. Completed antibiotics.  Saw Dr Haroldine Laws 08/17/21. EF had normalized with recommendations to  repeat ECHO to reassess EF and look at mitral valve.    Today she returns for HF follow up.Overall feeling fine. Says she is active at home. No dizziness. Denies SOB/PND/Orthopnea. No chest pain. Appetite ok. No fever or chills. Weight at home stable.  Taking all medications but  she has been out of spiro and potassium for the last 7 days.    Echo today EF 55%. Mod -severe MR Mild AI.   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   R/LHC (2/22): Ost RCA to Prox RCA lesion is 100% stenosed. Mid Cx lesion is 80% stenosed. 1st Mrg lesion is 100% stenosed. Ramus lesion is 30% stenosed. Mid LM to Dist LM lesion is 20% stenosed. Prox LAD lesion is 30% stenosed. Dist RCA lesion is 100% stenosed. Findings: Ao = 154.73 (104) LV = 143/20 RA =  13 RV = 65/15 PA = 60/24 (37)  PCW = 27 (v = 40) Fick cardiac output/index = 4.7/2.7 PVR = 2.1 WU SVR = 1552 Ao sat = 97% PA sat = 64%, 64% Assessment: 1. 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 80% lesion in mLCX 3. Mild non-obstructive CAD in LAD 4. 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 Treat medically for now. If develops anginal symptoms can consider PCI mLCX. Needs diuresis first.   ROS: All systems negative except as listed in HPI, PMH and Problem List.  SH:  Social History   Socioeconomic History   Marital status: Married  Spouse name: Not on file   Number of children: Not on file   Years of education: Not on file   Highest education level: Not on file  Occupational History   Occupation: retired  Tobacco Use   Smoking status: Some Days    Packs/day: 0.50    Years: 15.00    Pack years: 7.50    Types: Cigarettes    Last attempt to quit: 11/19/2020    Years since quitting: 0.8   Smokeless tobacco: Never  Vaping Use   Vaping Use: Never used  Substance and Sexual Activity   Alcohol use: Not Currently   Drug use: No   Sexual activity: Not on file  Other Topics Concern   Not on file  Social History Narrative   Not on  file   Social Determinants of Health   Financial Resource Strain: Medium Risk   Difficulty of Paying Living Expenses: Somewhat hard  Food Insecurity: No Food Insecurity   Worried About Running Out of Food in the Last Year: Never true   Ran Out of Food in the Last Year: Never true  Transportation Needs: No Transportation Needs   Lack of Transportation (Medical): No   Lack of Transportation (Non-Medical): No  Physical Activity: Not on file  Stress: Not on file  Social Connections: Not on file  Intimate Partner Violence: Not on file   FH:  Family History  Problem Relation Age of Onset   Diabetes Mother    Hypertension Mother    Heart Problems Sister    Amblyopia Neg Hx    Blindness Neg Hx    Cataracts Neg Hx    Glaucoma Neg Hx    Macular degeneration Neg Hx    Retinal detachment Neg Hx    Strabismus Neg Hx    Retinitis pigmentosa Neg Hx    Past Medical History:  Diagnosis Date   Arthritis    Cataract    Diabetes mellitus type 2 with complications (HCC)    diagnosed probably 2012 per patient   Diabetic retinopathy (HCC)    Hypertension    Rheumatoid arthritis (HCC)    Current Outpatient Medications  Medication Sig Dispense Refill   ACCU-CHEK GUIDE test strip See admin instructions.     Accu-Chek Softclix Lancets lancets See admin instructions.     acetaminophen (TYLENOL) 325 MG tablet Take 1-2 tablets (325-650 mg total) by mouth every 6 (six) hours as needed for mild pain (pain score 1-3 or temp > 100.5). 20 tablet 0   aspirin EC 81 MG tablet Take 81 mg by mouth every Monday, Wednesday, and Friday.     atorvastatin (LIPITOR) 40 MG tablet Take 1 tablet (40 mg total) by mouth daily. 90 tablet 3   bisacodyl (DULCOLAX) 5 MG EC tablet Take 1 tablet (5 mg total) by mouth daily as needed for moderate constipation. 30 tablet 0   Blood Glucose Monitoring Suppl (ACCU-CHEK GUIDE ME) w/Device KIT See admin instructions.     Continuous Blood Gluc Sensor (FREESTYLE LIBRE 14 DAY  SENSOR) MISC 1 each by Does not apply route every 14 (fourteen) days. 2 each 11   dapagliflozin propanediol (FARXIGA) 10 MG TABS tablet Take 1 tablet (10 mg total) by mouth daily before breakfast. 90 tablet 3   docusate sodium (COLACE) 100 MG capsule Take 100 mg by mouth daily as needed for mild constipation.     ferrous gluconate (FERGON) 324 MG tablet Take 1 tablet (324 mg total) by mouth 2 (two) times daily with   a meal. 180 tablet 1   furosemide (LASIX) 40 MG tablet Take 0.5 tablets (20 mg total) by mouth daily. 15 tablet 11   hydrALAZINE (APRESOLINE) 50 MG tablet Take 1 tablet (50 mg total) by mouth every 8 (eight) hours. 90 tablet 6   HYDROcodone-acetaminophen (NORCO/VICODIN) 5-325 MG tablet Take 1-2 tablets by mouth every 6 (six) hours as needed for severe pain. 42 tablet 0   hydroxychloroquine (PLAQUENIL) 200 MG tablet Take 1 tablet (200 mg total) by mouth daily. 30 tablet 2   isosorbide mononitrate (IMDUR) 60 MG 24 hr tablet Take 1 tablet (60 mg total) by mouth daily. 30 tablet 11   linagliptin (TRADJENTA) 5 MG TABS tablet Take 1 tablet (5 mg total) by mouth daily. 90 tablet 1   metoprolol succinate (TOPROL-XL) 50 MG 24 hr tablet TAKE 1 TABLET(50 MG) BY MOUTH DAILY WITH OR IMMEDIATELY FOLLOWING A MEAL 30 tablet 5   polyethylene glycol (MIRALAX / GLYCOLAX) 17 g packet Take 17 g by mouth daily as needed for mild constipation. 14 each 0   sacubitril-valsartan (ENTRESTO) 97-103 MG Take 1 tablet by mouth 2 (two) times daily. 60 tablet 6   Vitamin D, Ergocalciferol, (DRISDOL) 1.25 MG (50000 UNIT) CAPS capsule Take 50,000 Units by mouth once a week.     potassium chloride SA (KLOR-CON) 20 MEQ tablet Take 2 tablets (40 mEq total) by mouth 2 (two) times daily. 120 tablet 3   spironolactone (ALDACTONE) 25 MG tablet Take 1 tablet (25 mg total) by mouth daily. 90 tablet 1   No current facility-administered medications for this encounter.   Vitals:   09/19/21 1225  BP: (!) 162/70  Pulse: 61  SpO2:  99%  Weight: 61 kg (134 lb 6.4 oz)     Wt Readings from Last 3 Encounters:  09/19/21 61 kg (134 lb 6.4 oz)  08/30/21 61.1 kg (134 lb 9.6 oz)  08/25/21 61.2 kg (135 lb)   PHYSICAL EXAM: General:  Well appearing. No resp difficulty. Walked in the clinic HEENT: normal Neck: supple. no JVD. Carotids 2+ bilat; no bruits. No lymphadenopathy or thryomegaly appreciated. Cor: PMI nondisplaced. Regular rate & rhythm. No rubs, gallops or murmurs. Lungs: clear Abdomen: soft, nontender, nondistended. No hepatosplenomegaly. No bruits or masses. Good bowel sounds. Extremities: no cyanosis, clubbing, rash, edema Neuro: alert & orientedx3, cranial nerves grossly intact. moves all 4 extremities w/o difficulty. Affect pleasant   ASSESSMENT & PLAN:  1. 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  - Had ECHO today. EF stable 55%. Mod-Severe MR- Set up TEE.  - NYHA II. Volume status stable. Continue lasix 20 mg daily.  - Continue spiro to 25 mg daily. Refill spiro and potassium today.  - Continue Toprol XL 50 mg daily. - Continue hydralazine 50 mg tid + Imdur 60 mg daily. - Continue Farxiga 10 mg daily. - Continue Entresto to 97/103mg twice a day.  - Check BMET today   2. CAD  - R/LHC (2/22) 3v CAD with totally occluded RCA with L -> R collaterals, 80% lesion in mLCX, mild non-obs in LAD - No chest pain.  - Continue aspirin 81 mg daily.   - Continue atorvastatin 40 mg daily.   3. 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 Set up TEE to further assess. We discussed procedure today.  -Discussed with Dr Bensimhon.   4. T2DM: -a1c  04/2021 7.0 -On tradjenta + Farxiga -recent yeast infection but resolved with treatment.  -Continue farxiga.  - Has follow up with Dr Tysinger.   5. CKD3a: - Baseline Scr~1.2-1.5 - Recent creatinine 1 on 08/17/21   6. Severe Sleep Apnea . - AHI 60/hr. Suspect severe OSA contributing to HTN  - unable to tolerate CPAP. Would she be candidate for Inspire device?  - Followed by Dr Turner.   7. HTN -Elevated but has been off spiro . Restarting spiro today.  - Also missed afternoon meds for this appointment.   8. Tobacco Use -Discussed smoking cessation   Set up TEE today to assess mitral valve.   Follow up with Dr Bensimhon in 8 weeks.    Delaila Nand, NP_C 09/19/21  

## 2021-09-18 NOTE — H&P (View-Only) (Signed)
[facility]  PCP: Dr Elonda Husky  Cardiologist: Dr. Oval Linsey HF Cardiologist: Dr. Haroldine Laws  Reason for Visit: Heart Failure   HPI: Maria Foster is a 65 y.o.  female  with DM2, HTN, obesity, CKD 3a , CAD, MR, and systolic HF        Admitted 12/19/20 with new onset HF. On admit also hypertensive with AKI. 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 She was diuresed with IV lasix and cardiology was consulted. Discharge weight was 144.5 lbs. Underwent cath 2/22 with 3v CAD with totally occluded RCA with L -> R collaterals, 80% lesion in mLCX and non-obstructive CAD in LAD. EF 40% with elevated filling pressures and normal CO. Given lack of angina, she was treated medically. 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  Had return f/u 6/22. Echo showed improved EF up to 55%. 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 BP was elevated in the setting of running out of hydralazine and spiro. She was stable from symptom standpoint, NYHA Class II. Hydral and spiro were refilled and Entresto increased to 97-103 bid. Referral was placed to be seen in the HTN clinic. It appears they attempted to contact her for appt but was unsuccessfull in reaching the patient.   She was admitted to [facility] 8/22-8/27 for septic arthritis involving the rt elbow. This was after receiving steroid injection. Synovial fluid cx grew staph aureus. Blood cultures negative. Taken for washout and placed on IV abx x 4 weeks. Completed antibiotics.  Saw Dr Haroldine Laws 08/17/21. EF had normalized with recommendations to  repeat ECHO to reassess EF and look at mitral valve.    Today she returns for HF follow up.Overall feeling fine. Says she is active at home. No dizziness. Denies SOB/PND/Orthopnea. No chest pain. Appetite ok. No fever or chills. Weight at home stable.  Taking all medications but  she has been out of spiro and potassium for the last 7 days.    Echo today EF 55%. Mod -severe MR Mild AI.   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   R/LHC (2/22): Ost RCA to Prox RCA lesion is 100% stenosed. Mid Cx lesion is 80% stenosed. 1st Mrg lesion is 100% stenosed. Ramus lesion is 30% stenosed. Mid LM to Dist LM lesion is 20% stenosed. Prox LAD lesion is 30% stenosed. Dist RCA lesion is 100% stenosed. Findings: Ao = 154.73 (104) LV = 143/20 RA =  13 RV = 65/15 PA = 60/24 (37)  PCW = 27 (v = 40) Fick cardiac output/index = 4.7/2.7 PVR = 2.1 WU SVR = 1552 Ao sat = 97% PA sat = 64%, 64% Assessment: 1. 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 80% lesion in mLCX 3. Mild non-obstructive CAD in LAD 4. 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 Treat medically for now. If develops anginal symptoms can consider PCI mLCX. Needs diuresis first.   ROS: All systems negative except as listed in HPI, PMH and Problem List.  SH:  Social History   Socioeconomic History   Marital status: Married  Spouse name: Not on file   Number of children: Not on file   Years of education: Not on file   Highest education level: Not on file  Occupational History   Occupation: retired  Tobacco Use   Smoking status: Some Days    Packs/day: 0.50    Years: 15.00    Pack years: 7.50    Types: Cigarettes    Last attempt to quit: 11/19/2020    Years since quitting: 0.8   Smokeless tobacco: Never  Vaping Use   Vaping Use: Never used  Substance and Sexual Activity   Alcohol use: Not Currently   Drug use: No   Sexual activity: Not on file  Other Topics Concern   Not on file  Social History Narrative   Not on  file   Social Determinants of Health   Financial Resource Strain: Medium Risk   Difficulty of Paying Living Expenses: Somewhat hard  Food Insecurity: No Food Insecurity   Worried About Charity fundraiser in the Last Year: Never true   Ran Out of Food in the Last Year: Never true  Transportation Needs: No Transportation Needs   Lack of Transportation (Medical): No   Lack of Transportation (Non-Medical): No  Physical Activity: Not on file  Stress: Not on file  Social Connections: Not on file  Intimate Partner Violence: Not on file   FH:  Family History  Problem Relation Age of Onset   Diabetes Mother    Hypertension Mother    Heart Problems Sister    Amblyopia Neg Hx    Blindness Neg Hx    Cataracts Neg Hx    Glaucoma Neg Hx    Macular degeneration Neg Hx    Retinal detachment Neg Hx    Strabismus Neg Hx    Retinitis pigmentosa Neg Hx    Past Medical History:  Diagnosis Date   Arthritis    Cataract    Diabetes mellitus type 2 with complications ([place])    diagnosed probably 2012 per patient   Diabetic retinopathy ([place])    Hypertension    Rheumatoid arthritis ([place])    Current Outpatient Medications  Medication Sig Dispense Refill   ACCU-CHEK GUIDE test strip See admin instructions.     Accu-Chek Softclix Lancets lancets See admin instructions.     acetaminophen (TYLENOL) 325 MG tablet Take 1-2 tablets (325-650 mg total) by mouth every 6 (six) hours as needed for mild pain (pain score 1-3 or temp > 100.5). 20 tablet 0   aspirin EC 81 MG tablet Take 81 mg by mouth every Monday, Wednesday, and Friday.     atorvastatin (LIPITOR) 40 MG tablet Take 1 tablet (40 mg total) by mouth daily. 90 tablet 3   bisacodyl (DULCOLAX) 5 MG EC tablet Take 1 tablet (5 mg total) by mouth daily as needed for moderate constipation. 30 tablet 0   Blood Glucose Monitoring Suppl (ACCU-CHEK GUIDE ME) w/Device KIT See admin instructions.     Continuous Blood Gluc Sensor (FREESTYLE LIBRE 14 DAY  SENSOR) MISC 1 each by Does not apply route every 14 (fourteen) days. 2 each 11   dapagliflozin propanediol (FARXIGA) 10 MG TABS tablet Take 1 tablet (10 mg total) by mouth daily before breakfast. 90 tablet 3   docusate sodium (COLACE) 100 MG capsule Take 100 mg by mouth daily as needed for mild constipation.     ferrous gluconate (FERGON) 324 MG tablet Take 1 tablet (324 mg total) by mouth 2 (two) times daily with  a meal. 180 tablet 1   furosemide (LASIX) 40 MG tablet Take 0.5 tablets (20 mg total) by mouth daily. 15 tablet 11   hydrALAZINE (APRESOLINE) 50 MG tablet Take 1 tablet (50 mg total) by mouth every 8 (eight) hours. 90 tablet 6   HYDROcodone-acetaminophen (NORCO/VICODIN) 5-325 MG tablet Take 1-2 tablets by mouth every 6 (six) hours as needed for severe pain. 42 tablet 0   hydroxychloroquine (PLAQUENIL) 200 MG tablet Take 1 tablet (200 mg total) by mouth daily. 30 tablet 2   isosorbide mononitrate (IMDUR) 60 MG 24 hr tablet Take 1 tablet (60 mg total) by mouth daily. 30 tablet 11   linagliptin (TRADJENTA) 5 MG TABS tablet Take 1 tablet (5 mg total) by mouth daily. 90 tablet 1   metoprolol succinate (TOPROL-XL) 50 MG 24 hr tablet TAKE 1 TABLET(50 MG) BY MOUTH DAILY WITH OR IMMEDIATELY FOLLOWING A MEAL 30 tablet 5   polyethylene glycol (MIRALAX / GLYCOLAX) 17 g packet Take 17 g by mouth daily as needed for mild constipation. 14 each 0   sacubitril-valsartan (ENTRESTO) 97-103 MG Take 1 tablet by mouth 2 (two) times daily. 60 tablet 6   Vitamin D, Ergocalciferol, (DRISDOL) 1.25 MG (50000 UNIT) CAPS capsule Take 50,000 Units by mouth once a week.     potassium chloride SA (KLOR-CON) 20 MEQ tablet Take 2 tablets (40 mEq total) by mouth 2 (two) times daily. 120 tablet 3   spironolactone (ALDACTONE) 25 MG tablet Take 1 tablet (25 mg total) by mouth daily. 90 tablet 1   No current facility-administered medications for this encounter.   Vitals:   09/19/21 1225  BP: (!) 162/70  Pulse: 61  SpO2:  99%  Weight: 61 kg (134 lb 6.4 oz)     Wt Readings from Last 3 Encounters:  09/19/21 61 kg (134 lb 6.4 oz)  08/30/21 61.1 kg (134 lb 9.6 oz)  08/25/21 61.2 kg (135 lb)   PHYSICAL EXAM: General:  Well appearing. No resp difficulty. Walked in the clinic HEENT: normal Neck: supple. no JVD. Carotids 2+ bilat; no bruits. No lymphadenopathy or thryomegaly appreciated. Cor: PMI nondisplaced. Regular rate & rhythm. No rubs, gallops or murmurs. Lungs: clear Abdomen: soft, nontender, nondistended. No hepatosplenomegaly. No bruits or masses. Good bowel sounds. Extremities: no cyanosis, clubbing, rash, edema Neuro: alert & orientedx3, cranial nerves grossly intact. moves all 4 extremities w/o difficulty. Affect pleasant   ASSESSMENT & PLAN:  1. 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  - Had ECHO today. EF stable 55%. Mod-Severe MR- Set up TEE.  - NYHA II. Volume status stable. Continue lasix 20 mg daily.  - Continue spiro to 25 mg daily. Refill spiro and potassium today.  - Continue Toprol XL 50 mg daily. - Continue hydralazine 50 mg tid + Imdur 60 mg daily. - Continue Farxiga 10 mg daily. - Continue Entresto to 97/112m twice a day.  - Check BMET today   2. CAD  - R/LHC (2/22) 3v CAD with totally occluded RCA with L -> R collaterals, 80% lesion in mLCX, mild non-obs in LAD - No chest pain.  - Continue aspirin 81 mg daily.   - Continue atorvastatin 40 mg daily.   3. 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 Set up TEE to further assess. We discussed procedure today.  -Discussed with Dr Haroldine Laws.   4. T2DM: -a1c  04/2021 7.0 -On tradjenta + Farxiga -recent yeast infection but resolved with treatment.  -Continue farxiga.  - Has follow up with Dr Glade Lloyd.   5. CKD3a: - Baseline Scr~1.2-1.5 - Recent creatinine 1 on 08/17/21   6. Severe Sleep Apnea . - AHI 60/hr. Suspect severe OSA contributing to HTN  - unable to tolerate CPAP. Would she be candidate for Inspire device?  - Followed by Dr Radford Pax.   7. HTN -Elevated but has been off spiro . Restarting spiro today.  - Also missed afternoon meds for this appointment.   8. Tobacco Use -Discussed smoking cessation   Set up TEE today to assess mitral valve.   Follow up with Dr Haroldine Laws in 8 weeks.    Darrick Grinder, NP_C 09/19/21

## 2021-09-19 ENCOUNTER — Ambulatory Visit (HOSPITAL_COMMUNITY)
Admission: RE | Admit: 2021-09-19 | Discharge: 2021-09-19 | Disposition: A | Discharge status: Home / Self Care | Payer: Medicare Other | Source: Ambulatory Visit | Attending: Medical | Admitting: Medical

## 2021-09-19 ENCOUNTER — Other Ambulatory Visit: Payer: Self-pay

## 2021-09-19 ENCOUNTER — Ambulatory Visit (HOSPITAL_BASED_OUTPATIENT_CLINIC_OR_DEPARTMENT_OTHER)
Admission: RE | Admit: 2021-09-19 | Discharge: 2021-09-19 | Disposition: A | Discharge status: Home / Self Care | Payer: Medicare Other | Source: Ambulatory Visit | Attending: Cardiology | Admitting: Cardiology

## 2021-09-19 ENCOUNTER — Encounter (HOSPITAL_COMMUNITY): Payer: Self-pay

## 2021-09-19 VITALS — BP 162/70 | HR 61 | Wt 134.4 lb

## 2021-09-19 DIAGNOSIS — I5040 Unspecified combined systolic (congestive) and diastolic (congestive) heart failure: Secondary | ICD-10-CM

## 2021-09-19 DIAGNOSIS — I429 Cardiomyopathy, unspecified: Secondary | ICD-10-CM | POA: Insufficient documentation

## 2021-09-19 DIAGNOSIS — I2510 Atherosclerotic heart disease of native coronary artery without angina pectoris: Secondary | ICD-10-CM | POA: Diagnosis not present

## 2021-09-19 DIAGNOSIS — Z833 Family history of diabetes mellitus: Secondary | ICD-10-CM | POA: Diagnosis not present

## 2021-09-19 DIAGNOSIS — I340 Nonrheumatic mitral (valve) insufficiency: Secondary | ICD-10-CM

## 2021-09-19 DIAGNOSIS — Z7982 Long term (current) use of aspirin: Secondary | ICD-10-CM | POA: Diagnosis not present

## 2021-09-19 DIAGNOSIS — Z7984 Long term (current) use of oral hypoglycemic drugs: Secondary | ICD-10-CM | POA: Insufficient documentation

## 2021-09-19 DIAGNOSIS — I2582 Chronic total occlusion of coronary artery: Secondary | ICD-10-CM | POA: Insufficient documentation

## 2021-09-19 DIAGNOSIS — Z596 Low income: Secondary | ICD-10-CM | POA: Diagnosis not present

## 2021-09-19 DIAGNOSIS — Z8249 Family history of ischemic heart disease and other diseases of the circulatory system: Secondary | ICD-10-CM | POA: Insufficient documentation

## 2021-09-19 DIAGNOSIS — Z79899 Other long term (current) drug therapy: Secondary | ICD-10-CM | POA: Diagnosis not present

## 2021-09-19 DIAGNOSIS — G4733 Obstructive sleep apnea (adult) (pediatric): Secondary | ICD-10-CM

## 2021-09-19 DIAGNOSIS — E669 Obesity, unspecified: Secondary | ICD-10-CM | POA: Diagnosis not present

## 2021-09-19 DIAGNOSIS — I5082 Biventricular heart failure: Secondary | ICD-10-CM | POA: Diagnosis not present

## 2021-09-19 DIAGNOSIS — I130 Hypertensive heart and chronic kidney disease with heart failure and stage 1 through stage 4 chronic kidney disease, or unspecified chronic kidney disease: Secondary | ICD-10-CM | POA: Diagnosis not present

## 2021-09-19 DIAGNOSIS — E1122 Type 2 diabetes mellitus with diabetic chronic kidney disease: Secondary | ICD-10-CM | POA: Insufficient documentation

## 2021-09-19 DIAGNOSIS — G4730 Sleep apnea, unspecified: Secondary | ICD-10-CM | POA: Diagnosis not present

## 2021-09-19 DIAGNOSIS — N1831 Chronic kidney disease, stage 3a: Secondary | ICD-10-CM | POA: Diagnosis not present

## 2021-09-19 DIAGNOSIS — I10 Essential (primary) hypertension: Secondary | ICD-10-CM

## 2021-09-19 DIAGNOSIS — F17210 Nicotine dependence, cigarettes, uncomplicated: Secondary | ICD-10-CM | POA: Diagnosis not present

## 2021-09-19 LAB — ECHOCARDIOGRAM COMPLETE
AR max vel: 1.59 cm2
AV Area VTI: 1.56 cm2
AV Area mean vel: 1.56 cm2
AV Mean grad: 5 mmHg
AV Peak grad: 9.7 mmHg
Ao pk vel: 1.56 m/s
Area-P 1/2: 3.65 cm2
Calc EF: 56.3 %
MV M vel: 5.04 m/s
MV Peak grad: 101.6 mmHg
MV VTI: 1.46 cm2
Radius: 0.5 cm
S' Lateral: 3.4 cm
Single Plane A2C EF: 59.5 %
Single Plane A4C EF: 53.8 %
Weight: 2150.4 [oz_av]

## 2021-09-19 LAB — BASIC METABOLIC PANEL WITH GFR
Anion gap: 7 (ref 5–15)
BUN: 16 mg/dL (ref 8–23)
CO2: 24 mmol/L (ref 22–32)
Calcium: 9 mg/dL (ref 8.9–10.3)
Chloride: 106 mmol/L (ref 98–111)
Creatinine, Ser: 0.95 mg/dL (ref 0.44–1.00)
GFR, Estimated: >60 mL/min
Glucose, Bld: 156 mg/dL — ABNORMAL HIGH (ref 70–99)
Potassium: 4.2 mmol/L (ref 3.5–5.1)
Sodium: 137 mmol/L (ref 135–145)

## 2021-09-19 MED ORDER — SPIRONOLACTONE 25 MG PO TABS: 25.0000 mg | ORAL_TABLET | Freq: Every day | ORAL | 1 refills | Status: DC

## 2021-09-19 MED ORDER — POTASSIUM CHLORIDE CRYS ER 20 MEQ PO TBCR: 40.0000 meq | EXTENDED_RELEASE_TABLET | Freq: Two times a day (BID) | ORAL | 3 refills | Status: DC

## 2021-09-19 NOTE — Patient Instructions (Signed)
It was great to see you today! No medication changes are needed at this time.   Your physician recommends that you schedule a follow-up appointment in: 8 weeks with Dr Gala Romney  Your physician has requested that you have a TEE. During a TEE, sound waves are used to create images of your heart. It provides your doctor with information about the size and shape of your heart and how well your heart's chambers and valves are working. In this test, a transducer is attached to the end of a flexible tube that's guided down your throat and into your esophagus (the tube leading from you mouth to your stomach) to get a more detailed image of your heart. You are not awake for the procedure. Please see the instruction sheet given to you today. For further information please visit [email or web address]   Do the following things EVERYDAY: Weigh yourself in the morning before breakfast. Write it down and keep it in a log. Take your medicines as prescribed Eat low salt foods--Limit salt (sodium) to 2000 mg per day.  Stay as active as you can everyday Limit all fluids for the day to less than 2 liters  At the [facility], you and your health needs are our priority. As part of our continuing mission to provide you with exceptional heart care, we have created designated Provider Care Teams. These Care Teams include your primary Cardiologist (physician) and Advanced Practice Providers (APPs- Physician Assistants and Nurse Practitioners) who all work together to provide you with the care you need, when you need it.   You may see any of the following providers on your designated Care Team at your next follow up: Dr Arvilla Meres Dr Carron Curie, NP Robbie Lis, [place] [facility], [place] [name], PharmD   Please be sure to bring in all your medications bottles to every appointment.       You are scheduled for a TEE/Cardioversion/TEE Cardioversion on  10/09/21 with Dr. Gala Romney.  Please arrive at the [facility] (Main Entrance A) at [facility]: [address] [place], [place] [address] at 7:30am  DIET: Nothing to eat or drink after midnight except a sip of water with medications (see medication instructions below)  Medication Instructions: Hold Lasix on 10/09/21 Be sure to take Aspirin on 10/09/21    Labs: Pre procedure labs done 09/19/2021  You must have a responsible person to drive you home and stay in the waiting area during your procedure. Failure to do so could result in cancellation.  Bring your insurance cards.  *Special Note: Every effort is made to have your procedure done on time. Occasionally there are emergencies that occur at the hospital that may cause delays. Please be patient if a delay does occur.

## 2021-09-19 NOTE — Progress Notes (Signed)
  Echocardiogram 2D Echocardiogram has been performed.  Roosvelt Maser F 09/19/2021, 1:34 PM

## 2021-09-25 ENCOUNTER — Other Ambulatory Visit (HOSPITAL_COMMUNITY): Payer: Self-pay | Admitting: *Deleted

## 2021-09-25 DIAGNOSIS — I340 Nonrheumatic mitral (valve) insufficiency: Secondary | ICD-10-CM

## 2021-09-28 ENCOUNTER — Encounter (HOSPITAL_COMMUNITY): Payer: Self-pay | Admitting: Internal Medicine

## 2021-10-04 ENCOUNTER — Encounter (INDEPENDENT_AMBULATORY_CARE_PROVIDER_SITE_OTHER): Payer: Self-pay

## 2021-10-04 LAB — HM DIABETES EYE EXAM

## 2021-10-05 ENCOUNTER — Encounter (INDEPENDENT_AMBULATORY_CARE_PROVIDER_SITE_OTHER): Payer: Medicare Other | Admitting: Ophthalmology

## 2021-10-09 ENCOUNTER — Other Ambulatory Visit: Payer: Self-pay

## 2021-10-09 ENCOUNTER — Ambulatory Visit (HOSPITAL_COMMUNITY)
Admission: RE | Admit: 2021-10-09 | Discharge: 2021-10-09 | Disposition: A | Discharge status: Home / Self Care | Payer: Medicare Other | Attending: Internal Medicine | Admitting: Internal Medicine

## 2021-10-09 ENCOUNTER — Encounter (HOSPITAL_COMMUNITY)
Admission: RE | Disposition: A | Discharge status: Home / Self Care | Payer: Self-pay | Source: Home / Self Care | Attending: Internal Medicine

## 2021-10-09 ENCOUNTER — Ambulatory Visit (HOSPITAL_COMMUNITY): Payer: Medicare Other | Admitting: Certified Registered Nurse Anesthetist

## 2021-10-09 ENCOUNTER — Ambulatory Visit (HOSPITAL_BASED_OUTPATIENT_CLINIC_OR_DEPARTMENT_OTHER)
Admission: RE | Admit: 2021-10-09 | Discharge: 2021-10-09 | Disposition: A | Discharge status: Home / Self Care | Payer: Medicare Other | Source: Ambulatory Visit | Attending: Internal Medicine | Admitting: Internal Medicine

## 2021-10-09 ENCOUNTER — Encounter (HOSPITAL_COMMUNITY): Payer: Self-pay | Admitting: Internal Medicine

## 2021-10-09 DIAGNOSIS — G4730 Sleep apnea, unspecified: Secondary | ICD-10-CM | POA: Insufficient documentation

## 2021-10-09 DIAGNOSIS — N1831 Chronic kidney disease, stage 3a: Secondary | ICD-10-CM | POA: Insufficient documentation

## 2021-10-09 DIAGNOSIS — Z79899 Other long term (current) drug therapy: Secondary | ICD-10-CM | POA: Insufficient documentation

## 2021-10-09 DIAGNOSIS — I080 Rheumatic disorders of both mitral and aortic valves: Secondary | ICD-10-CM | POA: Insufficient documentation

## 2021-10-09 DIAGNOSIS — I340 Nonrheumatic mitral (valve) insufficiency: Secondary | ICD-10-CM | POA: Diagnosis not present

## 2021-10-09 DIAGNOSIS — I2510 Atherosclerotic heart disease of native coronary artery without angina pectoris: Secondary | ICD-10-CM | POA: Insufficient documentation

## 2021-10-09 DIAGNOSIS — E1122 Type 2 diabetes mellitus with diabetic chronic kidney disease: Secondary | ICD-10-CM | POA: Diagnosis not present

## 2021-10-09 DIAGNOSIS — I5042 Chronic combined systolic (congestive) and diastolic (congestive) heart failure: Secondary | ICD-10-CM | POA: Diagnosis not present

## 2021-10-09 DIAGNOSIS — I5082 Biventricular heart failure: Secondary | ICD-10-CM | POA: Diagnosis not present

## 2021-10-09 DIAGNOSIS — Z7984 Long term (current) use of oral hypoglycemic drugs: Secondary | ICD-10-CM | POA: Insufficient documentation

## 2021-10-09 DIAGNOSIS — I130 Hypertensive heart and chronic kidney disease with heart failure and stage 1 through stage 4 chronic kidney disease, or unspecified chronic kidney disease: Secondary | ICD-10-CM | POA: Insufficient documentation

## 2021-10-09 DIAGNOSIS — I700 Atherosclerosis of aorta: Secondary | ICD-10-CM | POA: Diagnosis not present

## 2021-10-09 DIAGNOSIS — F17210 Nicotine dependence, cigarettes, uncomplicated: Secondary | ICD-10-CM | POA: Diagnosis not present

## 2021-10-09 DIAGNOSIS — Z7982 Long term (current) use of aspirin: Secondary | ICD-10-CM | POA: Insufficient documentation

## 2021-10-09 HISTORY — PX: TEE WITHOUT CARDIOVERSION: SHX5443

## 2021-10-09 LAB — GLUCOSE, CAPILLARY: Glucose-Capillary: 88 mg/dL (ref 70–99)

## 2021-10-09 LAB — ECHO TEE
MV M vel: 5.13 m/s
MV Peak grad: 105.3 mmHg
Radius: 0.5 cm

## 2021-10-09 SURGERY — ECHOCARDIOGRAM, TRANSESOPHAGEAL
Anesthesia: Monitor Anesthesia Care

## 2021-10-09 MED ORDER — PROPOFOL 10 MG/ML IV BOLUS
INTRAVENOUS | Status: DC | PRN
  Administered 2021-10-09 (×2): 10 mg via INTRAVENOUS

## 2021-10-09 MED ORDER — DEXAMETHASONE SODIUM PHOSPHATE 10 MG/ML IJ SOLN
INTRAMUSCULAR | Status: DC | PRN
  Administered 2021-10-09: 10 mg via INTRAVENOUS

## 2021-10-09 MED ORDER — SODIUM CHLORIDE 0.9 % IV SOLN: INTRAVENOUS | Status: DC | PRN

## 2021-10-09 MED ORDER — LACTATED RINGERS IV SOLN: INTRAVENOUS | Status: DC | PRN

## 2021-10-09 MED ORDER — SUCCINYLCHOLINE CHLORIDE 200 MG/10ML IV SOSY
PREFILLED_SYRINGE | INTRAVENOUS | Status: DC | PRN
  Administered 2021-10-09: 160 mg via INTRAVENOUS

## 2021-10-09 MED ORDER — SODIUM CHLORIDE 0.9 % IV SOLN
INTRAVENOUS | Status: DC
  Administered 2021-10-09: 500 mL via INTRAVENOUS

## 2021-10-09 MED ORDER — DEXMEDETOMIDINE (PRECEDEX) IN NS 20 MCG/5ML (4 MCG/ML) IV SYRINGE
PREFILLED_SYRINGE | INTRAVENOUS | Status: DC | PRN
  Administered 2021-10-09: 8 ug via INTRAVENOUS

## 2021-10-09 MED ORDER — EPHEDRINE SULFATE-NACL 50-0.9 MG/10ML-% IV SOSY
PREFILLED_SYRINGE | INTRAVENOUS | Status: DC | PRN
  Administered 2021-10-09: 5 mg via INTRAVENOUS

## 2021-10-09 MED ORDER — ONDANSETRON HCL 4 MG/2ML IJ SOLN
INTRAMUSCULAR | Status: DC | PRN
  Administered 2021-10-09: 4 mg via INTRAVENOUS

## 2021-10-09 MED ORDER — PROPOFOL 500 MG/50ML IV EMUL
INTRAVENOUS | Status: DC | PRN
  Administered 2021-10-09: 125 ug/kg/min via INTRAVENOUS

## 2021-10-09 MED ORDER — LIDOCAINE 2% (20 MG/ML) 5 ML SYRINGE
INTRAMUSCULAR | Status: DC | PRN
  Administered 2021-10-09: 40 mg via INTRAVENOUS

## 2021-10-09 NOTE — Anesthesia Procedure Notes (Signed)
Procedure Name: MAC Date/Time: 10/09/2021 9:20 AM Performed by: Janene Harvey, CRNA Pre-anesthesia Checklist: Patient identified, Emergency Drugs available, Suction available and Patient being monitored Patient Re-evaluated:Patient Re-evaluated prior to induction Oxygen Delivery Method: Nasal cannula Placement Confirmation: positive ETCO2 Dental Injury: Teeth and Oropharynx as per pre-operative assessment

## 2021-10-09 NOTE — Interval H&P Note (Signed)
History and Physical Interval Note:  10/09/2021 9:09 AM  Maria Foster  has presented today for surgery, with the diagnosis of MITRAL VALVE DISORDER.  The various methods of treatment have been discussed with the patient and family. After consideration of risks, benefits and other options for treatment, the patient has consented to  Procedure(s): TRANSESOPHAGEAL ECHOCARDIOGRAM (TEE) (N/A) as a surgical intervention.  The patient's history has been reviewed, patient examined, no change in status, stable for surgery.  I have reviewed the patient's chart and labs.  Questions were answered to the patient's satisfaction.     Ebone Alcivar

## 2021-10-09 NOTE — CV Procedure (Signed)
    TRANSESOPHAGEAL ECHOCARDIOGRAM   NAME:  Maria Foster   MRN: [id number] DOB:  11-Nov-1956   ADMIT DATE: 10/09/2021  INDICATIONS:  Mitral regurgitation  PROCEDURE:   Informed consent was obtained prior to the procedure. The risks, benefits and alternatives for the procedure were discussed and the patient comprehended these risks.  Risks include, but are not limited to, cough, sore throat, vomiting, nausea, somnolence, esophageal and stomach trauma or perforation, bleeding, low blood pressure, aspiration, pneumonia, infection, trauma to the teeth and death.    After a procedural time-out, the patient was sedated by the anesthesia service. Several attempts were made to pass the probe but the patient had strong gag reflex and awakened despite seemingly adequate sedation. We were finally able to pass the probe but patient developed laryngospasm. Probe was removed and it was decided that the safest approach would be to intubate the patient for the procedure. The patient was intubated. IV decadron was given for wheezing.   Using a glidescope for guidance, the transesophageal probe was inserted in the esophagus and stomach without difficulty and multiple views were obtained.    COMPLICATIONS:    Respiratory distress. Laryngospasm   FINDINGS:  LEFT VENTRICLE: EF = 55%. No regional wall motion abnormalities.  RIGHT VENTRICLE: Normal size and function.   LEFT ATRIUM: Moderately dilated 4.6-4.7 cm  LEFT ATRIAL APPENDAGE: No thrombus.   RIGHT ATRIUM: Normal  AORTIC VALVE:  Trileaflet. Mildly calcified. Mild AI  MITRAL VALVE:    Mildly rheumatic. 2+ MR (mild to moderate). SBP at time 140-170  TRICUSPID VALVE: Normal. Trivial TR  PULMONIC VALVE: Grossly normal. No PI  INTERATRIAL SEPTUM: No PFO or ASD.  PERICARDIUM: No effusion  DESCENDING AORTA: Mild plaque   CONCLUSION:   Kaveon Blatz,MD 10:10 AM

## 2021-10-09 NOTE — Anesthesia Preprocedure Evaluation (Signed)
Anesthesia Evaluation  Patient identified by MRN, date of birth, ID band Patient awake    Reviewed: Allergy & Precautions, NPO status , Patient's Chart, lab work & pertinent test results  History of Anesthesia Complications Negative for: history of anesthetic complications  Airway Mallampati: II  TM Distance: >3 FB Neck ROM: Full    Dental  (+) Dental Advisory Given, Poor Dentition, Chipped, Missing,    Pulmonary neg shortness of breath, neg sleep apnea, neg COPD, neg recent URI, Current Smoker and Patient abstained from smoking.,    breath sounds clear to auscultation       Cardiovascular hypertension, Pt. on medications (-) angina+ CAD and +CHF  + Valvular Problems/Murmurs  Rhythm:Regular + Systolic murmurs 4627 tte:  1. 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 There is incomplete  leaflet coaptation and severe mitral regurgitation. BP during the study  noted to be 190/80 which can worsen regurgitation severity, or at least  the apperance on color doppler. The  mean gradient across the valve is 5 mmHG @ 63 bpm. This is elevated due  to severe MR, suspect no significant mitral stenosis. Would recommend to  either repeat a TTE with better BP control, or to pursue TEE. Of note, the  LV function has improved and the  MR has worsened which would also suggest that the MR is worse due to  severely elevated BP and not the etiology of the cardiomyopathy. Clinical  correlation is recommended. The mitral valve is rheumatic. Severe mitral  valve regurgitation. No evidence of  mitral stenosis.  2. Left ventricular ejection fraction, by estimation, is 50 to 55%. The  left ventricle has low normal function. The left ventricle has no regional  wall motion abnormalities. There is moderate concentric left ventricular  hypertrophy. Left ventricular  diastolic function could not be evaluated. The average left ventricular  global  longitudinal strain is -13.8 %. The global longitudinal strain is  abnormal.  3. Right ventricular systolic function is normal. The right ventricular  size is normal. There is normal pulmonary artery systolic pressure. The  estimated right ventricular systolic pressure is 03.5 mmHg.  4. Left atrial size was mild to moderately dilated.  5. The aortic valve is tricuspid. There is mild calcification of the  aortic valve. Aortic valve regurgitation is mild.  6. The inferior vena cava is normal in size with greater than 50%  respiratory variability, suggesting right atrial pressure of 3 mmHg.    1. 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 80% lesion in mLCX 3. Mild non-obstructive CAD in LAD 4. EF 40% with elevated filling pressures and normal CO    Neuro/Psych negative neurological ROS  negative psych ROS   GI/Hepatic   Endo/Other  diabetesLab Results      Component                Value               Date                      HGBA1C                   7.0 (H)             05/04/2021             Renal/GU Renal diseaseLab Results      Component  Value               Date                      CREATININE               1.05 (H)            07/10/2021           Lab Results      Component                Value               Date                      K                        3.8                 07/10/2021                Musculoskeletal  (+) Arthritis , Rheumatoid disorders,    Abdominal   Peds  Hematology negative hematology ROS (+) Lab Results      Component                Value               Date                      WBC                      8.3                 07/10/2021                HGB                      15.0                07/10/2021                HCT                      48.0 (H)            07/10/2021                MCV                      89.2                07/10/2021                PLT                      245                  07/10/2021              Anesthesia Other Findings   Reproductive/Obstetrics                             Anesthesia Physical  Anesthesia Plan  ASA: 3  Anesthesia Plan: MAC   Post-op Pain Management:    Induction: Intravenous  PONV Risk  Score and Plan: 2 and Ondansetron and Dexamethasone  Airway Management Planned: Nasal Cannula and Natural Airway  Additional Equipment: None  Intra-op Plan:   Post-operative Plan:   Informed Consent: I have reviewed the patients History and Physical, chart, labs and discussed the procedure including the risks, benefits and alternatives for the proposed anesthesia with the patient or authorized representative who has indicated his/her understanding and acceptance.     Dental advisory given  Plan Discussed with: CRNA and Anesthesiologist  Anesthesia Plan Comments:         Anesthesia Quick Evaluation

## 2021-10-09 NOTE — Discharge Instructions (Signed)

## 2021-10-09 NOTE — Anesthesia Postprocedure Evaluation (Signed)
Anesthesia Post Note  Patient: Maria Foster  Procedure(s) Performed: TRANSESOPHAGEAL ECHOCARDIOGRAM (TEE)     Patient location during evaluation: PACU Anesthesia Type: General Level of consciousness: awake and alert Pain management: pain level controlled Vital Signs Assessment: post-procedure vital signs reviewed and stable Respiratory status: spontaneous breathing, nonlabored ventilation, respiratory function stable and patient connected to nasal cannula oxygen Cardiovascular status: blood pressure returned to baseline and stable Postop Assessment: no apparent nausea or vomiting Anesthetic complications: no   No notable events documented.  Last Vitals:  Vitals:   10/09/21 1040 10/09/21 1050  BP: (!) 160/58 (!) 191/66  Pulse: (!) 59 (!) 57  Resp: 18 18  Temp:    SpO2: 94% 95%    Last Pain:  Vitals:   10/09/21 1050  TempSrc:   PainSc: 0-No pain                 [name]

## 2021-10-09 NOTE — Anesthesia Postprocedure Evaluation (Signed)
Anesthesia Post Note  Patient: Maria Foster  Procedure(s) Performed: TRANSESOPHAGEAL ECHOCARDIOGRAM (TEE)     Anesthesia Post Evaluation No notable events documented.  Last Vitals:  Vitals:   10/09/21 1040 10/09/21 1050  BP: (!) 160/58 (!) 191/66  Pulse: (!) 59 (!) 57  Resp: 18 18  Temp:    SpO2: 94% 95%    Last Pain:  Vitals:   10/09/21 1050  TempSrc:   PainSc: 0-No pain                 Kenderick Kobler

## 2021-10-09 NOTE — Transfer of Care (Signed)
Immediate Anesthesia Transfer of Care Note  Patient: Maria Foster  Procedure(s) Performed: TRANSESOPHAGEAL ECHOCARDIOGRAM (TEE)  Patient Location: Endoscopy Unit  Anesthesia Type:General  Level of Consciousness: drowsy and patient cooperative  Airway & Oxygen Therapy: Patient Spontanous Breathing and Patient connected to face mask oxygen  Post-op Assessment: Report given to RN and Post -op Vital signs reviewed and stable  Post vital signs: Reviewed and stable  Last Vitals:  Vitals Value Taken Time  BP 148/63 10/09/21 1020  Temp 36.5 C 10/09/21 1020  Pulse 63 10/09/21 1022  Resp 22 10/09/21 1022  SpO2 100 % 10/09/21 1022  Vitals shown include unvalidated device data.  Last Pain:  Vitals:   10/09/21 1020  TempSrc: Tympanic  PainSc:          Complications: No notable events documented.

## 2021-10-09 NOTE — Anesthesia Procedure Notes (Signed)
Procedure Name: Intubation Date/Time: 10/09/2021 9:39 AM Performed by: Janene Harvey, CRNA Pre-anesthesia Checklist: Patient identified, Emergency Drugs available, Suction available and Patient being monitored Patient Re-evaluated:Patient Re-evaluated prior to induction Oxygen Delivery Method: Circle system utilized Preoxygenation: Pre-oxygenation with 100% oxygen Induction Type: IV induction Ventilation: Mask ventilation without difficulty Laryngoscope Size: Mac and 4 Grade View: Grade I Tube type: Oral Tube size: 7.0 mm Number of attempts: 1 Airway Equipment and Method: Stylet and Oral airway Placement Confirmation: ETT inserted through vocal cords under direct vision, positive ETCO2 and breath sounds checked- equal and bilateral Secured at: 21 cm Tube secured with: Tape Dental Injury: Teeth and Oropharynx as per pre-operative assessment

## 2021-10-10 ENCOUNTER — Telehealth: Payer: Self-pay

## 2021-10-10 ENCOUNTER — Encounter: Payer: Self-pay | Admitting: Internal Medicine

## 2021-10-10 NOTE — Telephone Encounter (Signed)
Left message advising patient to contact the sleep lab to have her sleep study rescheduled. Left sleep number on voicemail.

## 2021-10-11 ENCOUNTER — Encounter (HOSPITAL_COMMUNITY): Payer: Self-pay | Admitting: Internal Medicine

## 2021-10-18 ENCOUNTER — Encounter: Payer: Self-pay | Admitting: Medical

## 2021-10-18 ENCOUNTER — Telehealth (INDEPENDENT_AMBULATORY_CARE_PROVIDER_SITE_OTHER): Payer: Medicare Other | Admitting: Medical

## 2021-10-18 ENCOUNTER — Other Ambulatory Visit: Payer: Self-pay

## 2021-10-18 ENCOUNTER — Other Ambulatory Visit (INDEPENDENT_AMBULATORY_CARE_PROVIDER_SITE_OTHER): Payer: Medicare Other

## 2021-10-18 VITALS — Temp 97.0°F | Wt 135.0 lb

## 2021-10-18 DIAGNOSIS — R52 Pain, unspecified: Secondary | ICD-10-CM

## 2021-10-18 DIAGNOSIS — Z20828 Contact with and (suspected) exposure to other viral communicable diseases: Secondary | ICD-10-CM

## 2021-10-18 DIAGNOSIS — E0801 Diabetes mellitus due to underlying condition with hyperosmolarity with coma: Secondary | ICD-10-CM

## 2021-10-18 DIAGNOSIS — R051 Acute cough: Secondary | ICD-10-CM | POA: Diagnosis not present

## 2021-10-18 DIAGNOSIS — N1831 Chronic kidney disease, stage 3a: Secondary | ICD-10-CM

## 2021-10-18 DIAGNOSIS — Z79899 Other long term (current) drug therapy: Secondary | ICD-10-CM

## 2021-10-18 DIAGNOSIS — J988 Other specified respiratory disorders: Secondary | ICD-10-CM | POA: Diagnosis not present

## 2021-10-18 DIAGNOSIS — Z794 Long term (current) use of insulin: Secondary | ICD-10-CM

## 2021-10-18 DIAGNOSIS — Z720 Tobacco use: Secondary | ICD-10-CM

## 2021-10-18 LAB — POC COVID19 BINAXNOW: SARS Coronavirus 2 Ag: NEGATIVE

## 2021-10-18 LAB — POCT INFLUENZA A/B
Influenza A, POC: POSITIVE — AB
Influenza B, POC: NEGATIVE

## 2021-10-18 MED ORDER — OSELTAMIVIR PHOSPHATE 75 MG PO CAPS: 75.0000 mg | ORAL_CAPSULE | Freq: Two times a day (BID) | ORAL | 0 refills | Status: DC

## 2021-10-18 MED ORDER — BENZONATATE 200 MG PO CAPS: 200.0000 mg | ORAL_CAPSULE | Freq: Three times a day (TID) | ORAL | 0 refills | Status: DC | PRN

## 2021-10-18 MED ORDER — GUAIFENESIN ER 600 MG PO TB12: 600.0000 mg | ORAL_TABLET | Freq: Two times a day (BID) | ORAL | 0 refills | Status: DC

## 2021-10-18 NOTE — Addendum Note (Signed)
Addended by: Jac Canavan on: 10/18/2021 12:12 PM   Modules accepted: Orders

## 2021-10-18 NOTE — Progress Notes (Addendum)
Subjective:     Patient ID: Maria Foster, female   DOB: 02/18/1956, 65 y.o.   MRN: [id number]  This visit type was conducted due to national recommendations for restrictions regarding the COVID-19 Pandemic (e.g. social distancing) in an effort to limit this patient's exposure and mitigate transmission in our community.  Due to their co-morbid illnesses, this patient is at least at moderate risk for complications without adequate follow up.  This format is felt to be most appropriate for this patient at this time.    Documentation for virtual audio and video telecommunications through [place] encounter:  The patient was located at home. The provider was located in the office. The patient did consent to this visit and is aware of possible charges through their insurance for this visit.  The other persons participating in this telemedicine service were none. Time spent on call was 20 minutes and in review of previous records 20 minutes total.  This virtual service is not related to other E/M service within previous 7 days.   HPI Chief Complaint  Patient presents with   cough    Coughing- hot/cold, alittle HA, aching, symptoms started Monday evening   Virtual consult for illness.  Symptoms started 3 days ago with cough, headache, body aches, hot and cold feeling, decreased appetite.  Has some earache.  No sore throat, no shortness of breath or wheezing, no fever no nausea vomiting or diarrhea.  Her grandson just got over the flu recently.  She was around him at Thanksgiving but not sure if he was over the illness at that time.  Using nothing for symptoms that she is already on a bunch of medication.  She is hydrating well.  She is an occasional smoker.  No other aggravating or relieving factors. No other complaint..  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 20 tablet 0   aspirin EC 81 MG tablet Take 81 mg by mouth every Monday, Wednesday, and Friday.     atorvastatin (LIPITOR) 40 MG tablet Take 1 tablet (40 mg total) by mouth daily. 90 tablet 3   bisacodyl (DULCOLAX) 5 MG EC tablet Take 1 tablet (5 mg total) by mouth daily as needed for moderate constipation. 30 tablet 0   dapagliflozin propanediol (FARXIGA) 10 MG TABS tablet Take 1 tablet (10 mg total) by mouth daily before breakfast. 90 tablet 3   docusate sodium (COLACE) 100 MG capsule Take 100 mg by mouth daily as needed for mild constipation.     ferrous gluconate (FERGON) 324 MG tablet Take 1 tablet (324 mg total) by mouth 2 (two) times daily with a meal. 180 tablet 1   furosemide (LASIX) 40 MG tablet Take 0.5 tablets (20 mg total) by mouth daily. 15 tablet 11   hydrALAZINE (APRESOLINE) 50 MG tablet Take 1 tablet (50 mg total) by mouth every 8 (eight) hours. 90 tablet 6   HYDROcodone-acetaminophen (NORCO/VICODIN) 5-325 MG tablet Take 1-2 tablets by mouth every 6 (six) hours as needed for severe  pain. 42 tablet 0   hydroxychloroquine (PLAQUENIL) 200 MG tablet Take 1 tablet (200 mg total) by mouth daily. 30 tablet 2   isosorbide mononitrate (IMDUR) 60 MG 24 hr tablet Take 1 tablet (60 mg total) by mouth daily. 30 tablet 11   linagliptin (TRADJENTA) 5 MG TABS tablet Take 1 tablet (5 mg total) by mouth daily. 90 tablet 1   metoprolol succinate (TOPROL-XL) 50 MG 24 hr tablet TAKE 1 TABLET(50 MG) BY MOUTH DAILY WITH OR IMMEDIATELY FOLLOWING A MEAL 30 tablet 5   potassium chloride SA (KLOR-CON) 20 MEQ tablet Take 2 tablets (40 mEq total) by mouth 2 (two) times daily. 120 tablet 3    sacubitril-valsartan (ENTRESTO) 97-103 MG Take 1 tablet by mouth 2 (two) times daily. 60 tablet 6   spironolactone (ALDACTONE) 25 MG tablet Take 1 tablet (25 mg total) by mouth daily. 90 tablet 1   Vitamin D, Ergocalciferol, (DRISDOL) 1.25 MG (50000 UNIT) CAPS capsule Take 50,000 Units by mouth every Friday.     ACCU-CHEK GUIDE test strip See admin instructions. (Patient not taking: Reported on 10/18/2021)     Accu-Chek Softclix Lancets lancets See admin instructions. (Patient not taking: Reported on 10/18/2021)     Blood Glucose Monitoring Suppl (ACCU-CHEK GUIDE ME) w/Device KIT See admin instructions. (Patient not taking: Reported on 10/18/2021)     polyethylene glycol (MIRALAX / GLYCOLAX) 17 g packet Take 17 g by mouth daily as needed for mild constipation. (Patient not taking: Reported on 10/18/2021) 14 each 0   No current facility-administered medications on file prior to visit.     Review of Systems As in subjective    Objective:   Physical Exam Due to coronavirus pandemic stay at home measures, patient visit was virtual and they were not examined in person.   Temp (!) 97 F (36.1 C)   Wt 135 lb (61.2 kg)   BMI 23.91 kg/m   General: No acute distress, answers questions in complete sentences  lots of coughing, no labored breathing or witnessed wheezing     Assessment:     Encounter Diagnoses  Name Primary?   Acute cough Yes   Respiratory tract infection    Body aches    Exposure to influenza    Occasional cigarette smoker    Stage 3a chronic kidney disease ([place])    Diabetes mellitus due to underlying condition with hyperosmolarity and coma, with long-term current use of insulin ([facility])    High risk medication use        Plan:     We discussed her symptoms and concerns and possibility of flu versus other respiratory tract infection.  She will come into our back parking lot for COVID and flu testing this morning.  We will plan to begin Mucinex and cough syrup below.   Advise rest, good hydration, avoid dehydration.  Can use Tylenol over-the-counter for pain or aches.  Advised if much worse in the coming days to call or recheck or go to the hospital if difficulty breathing or significantly worsening.    Addendum:  Flu +. Begin tamiflu.  I spoke to patient about results, symptoms, recommendations. I briefly listened to her lungs, clear.  F/u if worse in the next 2-3 days.  Marco was seen today for cough.  Diagnoses and all orders for this visit:  Acute cough  Respiratory tract infection  Body aches  Exposure to influenza  Occasional cigarette smoker  Stage 3a chronic kidney disease ([place])  Diabetes mellitus due to  underlying condition with hyperosmolarity and coma, with long-term current use of insulin ([facility])  High risk medication use  Other orders -     guaiFENesin (MUCINEX) 600 MG 12 hr tablet; Take 1 tablet (600 mg total) by mouth 2 (two) times daily. -     benzonatate (TESSALON) 200 MG capsule; Take 1 capsule (200 mg total) by mouth 3 (three) times daily as needed for cough.  Report to our back parking lot for testing

## 2021-10-23 ENCOUNTER — Encounter (INDEPENDENT_AMBULATORY_CARE_PROVIDER_SITE_OTHER): Payer: Medicare Other | Admitting: Ophthalmology

## 2021-11-01 ENCOUNTER — Telehealth: Payer: Self-pay | Admitting: Medical

## 2021-11-01 NOTE — Telephone Encounter (Signed)
Left message for patient to call back and schedule Medicare Annual Wellness Visit (AWV) either virtually or in office. I left my number for patient to call [phone].  awvi 01/18/15 per [facility]   please schedule at anytime with health coach  This should be a 45 minute visit.

## 2021-11-16 ENCOUNTER — Encounter (INDEPENDENT_AMBULATORY_CARE_PROVIDER_SITE_OTHER): Payer: Self-pay | Admitting: Ophthalmology

## 2021-11-16 ENCOUNTER — Ambulatory Visit (INDEPENDENT_AMBULATORY_CARE_PROVIDER_SITE_OTHER): Payer: Medicare Other | Admitting: Ophthalmology

## 2021-11-16 ENCOUNTER — Other Ambulatory Visit: Payer: Self-pay

## 2021-11-16 ENCOUNTER — Encounter (INDEPENDENT_AMBULATORY_CARE_PROVIDER_SITE_OTHER): Payer: Medicare Other | Admitting: Ophthalmology

## 2021-11-16 DIAGNOSIS — E113591 Type 2 diabetes mellitus with proliferative diabetic retinopathy without macular edema, right eye: Secondary | ICD-10-CM | POA: Insufficient documentation

## 2021-11-16 DIAGNOSIS — E113592 Type 2 diabetes mellitus with proliferative diabetic retinopathy without macular edema, left eye: Secondary | ICD-10-CM

## 2021-11-16 DIAGNOSIS — H2513 Age-related nuclear cataract, bilateral: Secondary | ICD-10-CM

## 2021-11-16 HISTORY — DX: Age-related nuclear cataract, bilateral: H25.13

## 2021-11-16 NOTE — Assessment & Plan Note (Signed)
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

## 2021-11-16 NOTE — Assessment & Plan Note (Addendum)
Very active PDR OS, no follow-up per patient report for the last 4 years with retina specialist.  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

## 2021-11-16 NOTE — Progress Notes (Signed)
11/16/2021     CHIEF COMPLAINT Patient presents for  Chief Complaint  Patient presents with   Retina Evaluation      HISTORY OF PRESENT ILLNESS: Maria Foster is a 65 y.o. female who presents to the clinic today for:   HPI     Retina Evaluation           Laterality: both eyes   Onset: 1 month ago   Duration: 1 month   Associated Symptoms: Negative for Flashes, Floaters and Distortion         Comments   NP- PDR OU- Referred by Dr. Laruth Bouchard  Pt states, "I do not feel like I have any visual issues other than issues driving at night and a few small specks floating in my right eye. But my eye doctor said I needed to come over here and have the rest of my eyes checked."  Pt reports that she has Type II DM for over 15 years. LBS: 139       Last edited by Demetrios Loll, COA on 11/16/2021  1:25 PM.      Referring physician: Olivia Canter, MD [address] [place],  [place] [address]  HISTORICAL INFORMATION:   Selected notes from the MEDICAL RECORD NUMBER    Lab Results  Component Value Date   HGBA1C 7.0 (H) 05/04/2021     CURRENT MEDICATIONS: No current outpatient medications on file. (Ophthalmic Drugs)   No current facility-administered medications for this visit. (Ophthalmic Drugs)   Current Outpatient Medications (Other)  Medication Sig   ACCU-CHEK GUIDE test strip See admin instructions. (Patient not taking: Reported on 10/18/2021)   Accu-Chek Softclix Lancets lancets See admin instructions. (Patient not taking: Reported on 10/18/2021)   acetaminophen (TYLENOL) 325 MG tablet Take 1-2 tablets (325-650 mg total) by mouth every 6 (six) hours as needed for mild pain (pain score 1-3 or temp > 100.5).   aspirin EC 81 MG tablet Take 81 mg by mouth every Monday, Wednesday, and Friday.   atorvastatin (LIPITOR) 40 MG tablet Take 1 tablet (40 mg total) by mouth daily.   benzonatate (TESSALON) 200 MG capsule Take 1 capsule (200 mg total) by mouth 3  (three) times daily as needed for cough.   bisacodyl (DULCOLAX) 5 MG EC tablet Take 1 tablet (5 mg total) by mouth daily as needed for moderate constipation.   Blood Glucose Monitoring Suppl (ACCU-CHEK GUIDE ME) w/Device KIT See admin instructions. (Patient not taking: Reported on 10/18/2021)   dapagliflozin propanediol (FARXIGA) 10 MG TABS tablet Take 1 tablet (10 mg total) by mouth daily before breakfast.   docusate sodium (COLACE) 100 MG capsule Take 100 mg by mouth daily as needed for mild constipation.   ferrous gluconate (FERGON) 324 MG tablet Take 1 tablet (324 mg total) by mouth 2 (two) times daily with a meal.   furosemide (LASIX) 40 MG tablet Take 0.5 tablets (20 mg total) by mouth daily.   guaiFENesin (MUCINEX) 600 MG 12 hr tablet Take 1 tablet (600 mg total) by mouth 2 (two) times daily.   hydrALAZINE (APRESOLINE) 50 MG tablet Take 1 tablet (50 mg total) by mouth every 8 (eight) hours.   HYDROcodone-acetaminophen (NORCO/VICODIN) 5-325 MG tablet Take 1-2 tablets by mouth every 6 (six) hours as needed for severe pain.   hydroxychloroquine (PLAQUENIL) 200 MG tablet Take 1 tablet (200 mg total) by mouth daily.   isosorbide mononitrate (IMDUR) 60 MG 24 hr tablet Take 1 tablet (  60 mg total) by mouth daily.   linagliptin (TRADJENTA) 5 MG TABS tablet Take 1 tablet (5 mg total) by mouth daily.   metoprolol succinate (TOPROL-XL) 50 MG 24 hr tablet TAKE 1 TABLET(50 MG) BY MOUTH DAILY WITH OR IMMEDIATELY FOLLOWING A MEAL   oseltamivir (TAMIFLU) 75 MG capsule Take 1 capsule (75 mg total) by mouth 2 (two) times daily.   polyethylene glycol (MIRALAX / GLYCOLAX) 17 g packet Take 17 g by mouth daily as needed for mild constipation. (Patient not taking: Reported on 10/18/2021)   potassium chloride SA (KLOR-CON) 20 MEQ tablet Take 2 tablets (40 mEq total) by mouth 2 (two) times daily.   sacubitril-valsartan (ENTRESTO) 97-103 MG Take 1 tablet by mouth 2 (two) times daily.   spironolactone (ALDACTONE) 25 MG  tablet Take 1 tablet (25 mg total) by mouth daily.   Vitamin D, Ergocalciferol, (DRISDOL) 1.25 MG (50000 UNIT) CAPS capsule Take 50,000 Units by mouth every Friday.   No current facility-administered medications for this visit. (Other)      REVIEW OF SYSTEMS:    ALLERGIES Allergies  Allergen Reactions   Metformin And Related Other (See Comments)    Kidney injury, GI upset, diarrhea    PAST MEDICAL HISTORY Past Medical History:  Diagnosis Date   Arthritis    Cataract    Diabetes mellitus type 2 with complications ([place])    diagnosed probably 2012 per patient   Diabetic retinopathy ([place])    Hypertension    Rheumatoid arthritis ([place])    Past Surgical History:  Procedure Laterality Date   INCISION AND DRAINAGE ABSCESS N/A 07/11/2021   Procedure: INCISION AND DRAINAGE RIGHT ELBOW;  Surgeon: Paralee Cancel, MD;  Location: [facility];  Service: Orthopedics;  Laterality: N/A;   PRP Bilateral OD - 07/23/2017 OS - 08/06/2017   RIGHT/LEFT HEART CATH AND CORONARY ANGIOGRAPHY N/A 01/05/2021   Procedure: RIGHT/LEFT HEART CATH AND CORONARY ANGIOGRAPHY;  Surgeon: Jolaine Artist, MD;  Location: [facility];  Service: Cardiovascular;  Laterality: N/A;   TEE WITHOUT CARDIOVERSION N/A 10/09/2021   Procedure: TRANSESOPHAGEAL ECHOCARDIOGRAM (TEE);  Surgeon: Jolaine Artist, MD;  Location: [facility];  Service: Cardiovascular;  Laterality: N/A;    FAMILY HISTORY Family History  Problem Relation Age of Onset   Diabetes Mother    Hypertension Mother    Heart Problems Sister    Amblyopia Neg Hx    Blindness Neg Hx    Cataracts Neg Hx    Glaucoma Neg Hx    Macular degeneration Neg Hx    Retinal detachment Neg Hx    Strabismus Neg Hx    Retinitis pigmentosa Neg Hx     SOCIAL HISTORY Social History   Tobacco Use   Smoking status: Some Days    Packs/day: 0.50    Years: 15.00    Pack years: 7.50    Types: Cigarettes    Last attempt to quit: 11/19/2020    Years since  quitting: 0.9   Smokeless tobacco: Never  Vaping Use   Vaping Use: Never used  Substance Use Topics   Alcohol use: Not Currently   Drug use: No         OPHTHALMIC EXAM:  Base Eye Exam     Visual Acuity (ETDRS)       Right Left   Dist [place] 20/25 20/40   Dist ph [place]  20/30         Tonometry (Tonopen, 1:30 PM)       Right Left  Pressure 18 17         Pupils       Pupils Dark Light Shape React APD   Right PERRL 2 2 Round Minimal None   Left PERRL 2 2 Round Minimal None         Visual Fields (Counting fingers)       Left Right    Full Full         Extraocular Movement       Right Left    Full, Ortho Full, Ortho         Neuro/Psych     Oriented x3: Yes   Mood/Affect: Normal         Dilation     Both eyes: 1.0% Mydriacyl, 2.5% Phenylephrine @ 1:29 PM           Slit Lamp and Fundus Exam     External Exam       Right Left   External Normal Normal         Slit Lamp Exam       Right Left   Lids/Lashes Normal Normal   Conjunctiva/Sclera Temporal and Nasal Pinguecula; racial melanosis Temporal and Nasal Pinguecula; racial melanosis   Cornea Clear Clear   Anterior Chamber Deep and quiet Deep and quiet   Iris dilated to 61mm; no NVI dilated to 6.40mm; no NVI   Lens 2+ Nuclear sclerosis, 3+ Cortical cataract 2+ Nuclear sclerosis, 3+ Cortical cataract   Anterior Vitreous Vitreous syneresis Vitreous syneresis; small clumps of VH inferiorlly; VH settling inferiorly          Fundus Exam       Right Left   Posterior Vitreous No PVD, small scattered preretinal hemorrhages inferiorly No PVD, small scattered preretinal hemorrhages inferiorly   Disc NVD extension along the inferotemporal arcade appears fibrotic reactive Massive NVD, greater than 12 disc areas in size   C/D Ratio 0.6 0.5   Macula DBH; early NVE temporally; with fibrous changes large region of nontreated retina in this area cotton wool spots sup; macular neovascularization  temporal aspect of the macula   Vessels NVE along inferiortemp  arcade, superonasal to disc NVE at 1030, peripherally at equator   [facility]; attached; scattered dot and blot hemorrhages 360; scattered PRP 360 w/ room for fill-in; pre-retinal heme above disc;  DBH; attached; scattered dot and blot hemorrhages 360; PRP laser, room for fill in temporally and inferiorly;             IMAGING AND PROCEDURES  Imaging and Procedures for 11/16/21  OCT, Retina - OU - Both Eyes       Right Eye Quality was good. Scan locations included subfoveal. Central Foveal Thickness: 254. Progression has been stable. Findings include no IRF, no SRF, abnormal foveal contour, epiretinal membrane, vitreomacular adhesion , vitreous traction (Mild inf edema).   Left Eye Quality was good. Scan locations included subfoveal. Central Foveal Thickness: 279. Progression has been stable. Findings include no SRF, no IRF, abnormal foveal contour, epiretinal membrane, vitreous traction, vitreomacular adhesion .   Notes Images taken, stored on drive   Diagnosis / Impression:  No diabetic macular edema  Clinical management:  See below  Abbreviations: NFP - Normal foveal profile. CME - cystoid macular edema. PED - pigment epithelial detachment. IRF - intraretinal fluid. SRF - subretinal fluid. EZ - ellipsoid zone. ERM - epiretinal membrane. ORA - outer retinal atrophy. ORT - outer retinal tubulation. SRHM - subretinal hyper-reflective material  Color Fundus Photography Optos - OU - Both Eyes       Right Eye Progression has no prior data. Disc findings include neovascularization. Macula : microaneurysms, epiretinal membrane. Vessels : Neovascularization. Periphery : neovascularization.   Left Eye Progression has no prior data. Disc findings include neovascularization. Macula : microaneurysms, epiretinal membrane. Vessels : Neovascularization. Periphery : neovascularization.   Notes Bilateral active PDR  with good to moderate scatter PRP OU.   Left eye with rather massive neovascularization of the nerve disc and posteriorly in the macular region.             ASSESSMENT/PLAN:  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 2 diabetes mellitus (HCC) Very active PDR OS, no follow-up per patient report for the last 4 years with retina specialist.  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   Very active posterior pole and peripheral neovascularization despite previous PRP  We will attempt to complete all PRP peripheral anteriorly  Will need to avoid antivegF in each eye so as to avoid the crunch syndrome   Cataract, nuclear sclerotic, both eyes OU progressive, will medically need to have cataract surgery with lens will implantation so as to allow for complete clearance of the vitreous cavity in anticipation of likely vitrectomy in the future for progressive PDR despite previous PRP as delineated by the E DRS series of reports  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 type 2 diabetes mellitus (HCC) Very active posterior pole and peripheral neovascularization despite previous PRP  We will attempt to complete all PRP peripheral anteriorly  Will need to avoid antivegF in each eye so as to avoid the crunch syndrome     ICD-10-CM   1. 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 2 diabetes mellitus, unspecified proliferative retinopathy type (HCC)  D17.6160 OCT, Retina - OU - Both Eyes    Color Fundus Photography Optos - OU - Both Eyes    3. Cataract, nuclear sclerotic, both eyes  H25.13       1.  Overall plan  for each eye with the setting of progressive PDR with massive NVD and NVE exhibiting very high risk characteristics yet preserved visual acuity is to deliver posterior PRP, outside the macula, to decrease the macular neovascularization as well as anterior PRP as much as can be delivered through the lens opacities OU which are now having medical hindrance to delivery of peripheral PRP due to cortical change  2.  PRP #1 additional OS added today posteriorly t follow-up next week for peripheral PRP and posterior PRP right eye  3.  After will be recommending and have discussed with the patient the need for medical cataract extraction with intraocular lens placement due to the cortical cataract hampering adequate delivery of peripheral anterior PRP which is a segment of the eye likely triggering ongoing neovascular tissue growth posteriorly.  More over a month pseudophakia would allow for subsequent vitrectomy with peeling of all the neovascular tissue should it be required in the future.  4.  Typically we will avoid antivegF at this time due to the risk of crunch syndrome with this amount of massive posterior neovascular disease  Ophthalmic Meds Ordered this visit:  No orders of the defined types were placed in this encounter.      Return in about 1 week (around 11/23/2021) for ,,,,,,,,,PRP OS now, today, ,,,,,,,,,dilate, OD, PRP,,, 1 week OD,,.  There are no Patient Instructions on file for this visit.   Explained the diagnoses, plan, and follow up with the patient and they expressed understanding.  Patient expressed understanding of the importance of proper follow up care.   Clent Demark Azhar Yogi M.D. Diseases & Surgery of the Retina and Vitreous Retina [facility] Diabetic [place] 11/16/21     Abbreviations: M myopia (nearsighted); A astigmatism; H hyperopia (farsighted); P presbyopia; Mrx spectacle prescription;  CTL contact lenses; OD right eye; OS left eye; OU both eyes  XT exotropia; ET esotropia;  PEK punctate epithelial keratitis; PEE punctate epithelial erosions; DES dry eye syndrome; MGD meibomian gland dysfunction; ATs artificial tears; PFAT's preservative free artificial tears; [place] nuclear sclerotic cataract; PSC posterior subcapsular cataract; ERM epi-retinal membrane; PVD posterior vitreous detachment; RD retinal detachment; DM diabetes mellitus; DR diabetic retinopathy; NPDR non-proliferative diabetic retinopathy; PDR proliferative diabetic retinopathy; CSME clinically significant macular edema; DME diabetic macular edema; dbh dot blot hemorrhages; CWS cotton wool spot; POAG primary open angle glaucoma; C/D cup-to-disc ratio; HVF humphrey visual field; GVF goldmann visual field; OCT optical coherence tomography; IOP intraocular pressure; BRVO Branch retinal vein occlusion; CRVO central retinal vein occlusion; CRAO central retinal artery occlusion; BRAO branch retinal artery occlusion; RT retinal tear; SB scleral buckle; PPV pars plana vitrectomy; VH Vitreous hemorrhage; PRP panretinal laser photocoagulation; IVK intravitreal kenalog; VMT vitreomacular traction; MH Macular hole;  NVD neovascularization of the disc; NVE neovascularization elsewhere; AREDS age related eye disease study; ARMD age related macular degeneration; POAG primary open angle glaucoma; EBMD epithelial/anterior basement membrane dystrophy; ACIOL anterior chamber intraocular lens; IOL intraocular lens; PCIOL posterior chamber intraocular lens; Phaco/IOL phacoemulsification with intraocular lens placement; [place] photorefractive keratectomy; LASIK laser assisted in situ keratomileusis; HTN hypertension; DM diabetes mellitus; COPD chronic obstructive pulmonary disease

## 2021-11-16 NOTE — Assessment & Plan Note (Signed)
OU progressive, will medically need to have cataract surgery with lens will implantation so as to allow for complete clearance of the vitreous cavity in anticipation of likely vitrectomy in the future for progressive PDR despite previous PRP as delineated by the E DRS series of reports

## 2021-11-17 ENCOUNTER — Ambulatory Visit (INDEPENDENT_AMBULATORY_CARE_PROVIDER_SITE_OTHER): Payer: Medicare Other

## 2021-11-17 VITALS — Ht 63.0 in | Wt 139.0 lb

## 2021-11-17 DIAGNOSIS — Z0000 Encounter for general adult medical examination without abnormal findings: Secondary | ICD-10-CM

## 2021-11-17 NOTE — Progress Notes (Signed)
I connected with Nelta Numbers today by telephone and verified that I am speaking with the correct person using two identifiers. Location patient: home Location provider: work Persons participating in the virtual visit: Oletta Buehring, Glenna Durand LPN.   I discussed the limitations, risks, security and privacy concerns of performing an evaluation and management service by telephone and the availability of in person appointments. I also discussed with the patient that there may be a patient responsible charge related to this service. The patient expressed understanding and verbally consented to this telephonic visit.    Interactive audio and video telecommunications were attempted between this provider and patient, however failed, due to patient having technical difficulties OR patient did not have access to video capability.  We continued and completed visit with audio only.     Vital signs may be patient reported or missing.  Subjective:   MARYLEN ZUK is a 65 y.o. female who presents for an Initial Medicare Annual Wellness Visit.  Review of Systems     Cardiac Risk Factors include: advanced age (>43men, >37 women);diabetes mellitus;dyslipidemia;hypertension     Objective:    Today's Vitals   11/17/21 1110  Weight: 139 lb (63 kg)  Height: $Remove'5\' 3"'TETpCwT$  (1.6 m)   Body mass index is 24.62 kg/m.  Advanced Directives 11/17/2021 10/09/2021 07/10/2021 12/19/2020 12/18/2020 11/29/2016  Does Patient Have a Medical Advance Directive? Yes No No No No No  Type of Advance Directive [place] in Chart? No - copy requested - - - - -  Would patient like information on creating a medical advance directive? - No - Patient declined No - Patient declined No - Patient declined Yes (ED - Information included in AVS) -    Current Medications (verified) Outpatient Encounter Medications as of 11/17/2021  Medication Sig   ACCU-CHEK GUIDE  test strip See admin instructions.   Accu-Chek Softclix Lancets lancets See admin instructions.   acetaminophen (TYLENOL) 325 MG tablet Take 1-2 tablets (325-650 mg total) by mouth every 6 (six) hours as needed for mild pain (pain score 1-3 or temp > 100.5).   aspirin EC 81 MG tablet Take 81 mg by mouth every Monday, Wednesday, and Friday.   atorvastatin (LIPITOR) 40 MG tablet Take 1 tablet (40 mg total) by mouth daily.   bisacodyl (DULCOLAX) 5 MG EC tablet Take 1 tablet (5 mg total) by mouth daily as needed for moderate constipation.   Blood Glucose Monitoring Suppl (ACCU-CHEK GUIDE ME) w/Device KIT See admin instructions.   dapagliflozin propanediol (FARXIGA) 10 MG TABS tablet Take 1 tablet (10 mg total) by mouth daily before breakfast.   docusate sodium (COLACE) 100 MG capsule Take 100 mg by mouth daily as needed for mild constipation.   ferrous gluconate (FERGON) 324 MG tablet Take 1 tablet (324 mg total) by mouth 2 (two) times daily with a meal.   furosemide (LASIX) 40 MG tablet Take 0.5 tablets (20 mg total) by mouth daily.   guaiFENesin (MUCINEX) 600 MG 12 hr tablet Take 1 tablet (600 mg total) by mouth 2 (two) times daily.   hydrALAZINE (APRESOLINE) 50 MG tablet Take 1 tablet (50 mg total) by mouth every 8 (eight) hours.   HYDROcodone-acetaminophen (NORCO/VICODIN) 5-325 MG tablet Take 1-2 tablets by mouth every 6 (six) hours as needed for severe pain.   hydroxychloroquine (PLAQUENIL) 200 MG tablet Take 1 tablet (200 mg total) by mouth daily.   isosorbide mononitrate (  IMDUR) 60 MG 24 hr tablet Take 1 tablet (60 mg total) by mouth daily.   linagliptin (TRADJENTA) 5 MG TABS tablet Take 1 tablet (5 mg total) by mouth daily.   metoprolol succinate (TOPROL-XL) 50 MG 24 hr tablet TAKE 1 TABLET(50 MG) BY MOUTH DAILY WITH OR IMMEDIATELY FOLLOWING A MEAL   polyethylene glycol (MIRALAX / GLYCOLAX) 17 g packet Take 17 g by mouth daily as needed for mild constipation.   potassium chloride SA (KLOR-CON)  20 MEQ tablet Take 2 tablets (40 mEq total) by mouth 2 (two) times daily.   sacubitril-valsartan (ENTRESTO) 97-103 MG Take 1 tablet by mouth 2 (two) times daily.   spironolactone (ALDACTONE) 25 MG tablet Take 1 tablet (25 mg total) by mouth daily.   Vitamin D, Ergocalciferol, (DRISDOL) 1.25 MG (50000 UNIT) CAPS capsule Take 50,000 Units by mouth every Friday.   benzonatate (TESSALON) 200 MG capsule Take 1 capsule (200 mg total) by mouth 3 (three) times daily as needed for cough. (Patient not taking: Reported on 11/17/2021)   oseltamivir (TAMIFLU) 75 MG capsule Take 1 capsule (75 mg total) by mouth 2 (two) times daily. (Patient not taking: Reported on 11/17/2021)   No facility-administered encounter medications on file as of 11/17/2021.    Allergies (verified) Metformin and related   History: Past Medical History:  Diagnosis Date   Arthritis    Cataract    Diabetes mellitus type 2 with complications ([place])    diagnosed probably 2012 per patient   Diabetic retinopathy ([place])    Hypertension    Rheumatoid arthritis ([place])    Past Surgical History:  Procedure Laterality Date   INCISION AND DRAINAGE ABSCESS N/A 07/11/2021   Procedure: INCISION AND DRAINAGE RIGHT ELBOW;  Surgeon: Paralee Cancel, MD;  Location: [facility];  Service: Orthopedics;  Laterality: N/A;   PRP Bilateral OD - 07/23/2017 OS - 08/06/2017   RIGHT/LEFT HEART CATH AND CORONARY ANGIOGRAPHY N/A 01/05/2021   Procedure: RIGHT/LEFT HEART CATH AND CORONARY ANGIOGRAPHY;  Surgeon: Jolaine Artist, MD;  Location: [facility];  Service: Cardiovascular;  Laterality: N/A;   TEE WITHOUT CARDIOVERSION N/A 10/09/2021   Procedure: TRANSESOPHAGEAL ECHOCARDIOGRAM (TEE);  Surgeon: Jolaine Artist, MD;  Location: [facility];  Service: Cardiovascular;  Laterality: N/A;   Family History  Problem Relation Age of Onset   Diabetes Mother    Hypertension Mother    Heart Problems Sister    Amblyopia Neg Hx    Blindness Neg Hx     Cataracts Neg Hx    Glaucoma Neg Hx    Macular degeneration Neg Hx    Retinal detachment Neg Hx    Strabismus Neg Hx    Retinitis pigmentosa Neg Hx    Social History   Socioeconomic History   Marital status: Married    Spouse name: Not on file   Number of children: Not on file   Years of education: Not on file   Highest education level: Not on file  Occupational History   Occupation: retired  Tobacco Use   Smoking status: Former    Packs/day: 0.50    Years: 15.00    Pack years: 7.50    Types: Cigarettes    Quit date: 11/19/2020    Years since quitting: 0.9   Smokeless tobacco: Never  Vaping Use   Vaping Use: Never used  Substance and Sexual Activity   Alcohol use: Not Currently   Drug use: No   Sexual activity: Not on file  Other Topics Concern  Not on file  Social History Narrative   Not on file   Social Determinants of Health   Financial Resource Strain: Low Risk    Difficulty of Paying Living Expenses: Not hard at all  Food Insecurity: No Food Insecurity   Worried About Charity fundraiser in the Last Year: Never true   Arboriculturist in the Last Year: Never true  Transportation Needs: No Transportation Needs   Lack of Transportation (Medical): No   Lack of Transportation (Non-Medical): No  Physical Activity: Inactive   Days of Exercise per Week: 0 days   Minutes of Exercise per Session: 0 min  Stress: No Stress Concern Present   Feeling of Stress : Not at all  Social Connections: Not on file    Tobacco Counseling Counseling given: Not Answered   Clinical Intake:  Pre-visit preparation completed: Yes  Pain : No/denies pain     Nutritional Status: BMI of 19-24  Normal Nutritional Risks: None Diabetes: Yes  How often do you need to have someone help you when you read instructions, pamphlets, or other written materials from your doctor or pharmacy?: 1 - Never What is the last grade level you completed in school?: 12th grade  Diabetic?  Yes Nutrition Risk Assessment:  Has the patient had any N/V/D within the last 2 months?  No  Does the patient have any non-healing wounds?  No  Has the patient had any unintentional weight loss or weight gain?  No   Diabetes:  Is the patient diabetic?  Yes  If diabetic, was a CBG obtained today?  No  Did the patient bring in their glucometer from home?  No  How often do you monitor your CBG's? Twice weekly.   Financial Strains and Diabetes Management:  Are you having any financial strains with the device, your supplies or your medication? No .  Does the patient want to be seen by Chronic Care Management for management of their diabetes?  No  Would the patient like to be referred to a Nutritionist or for Diabetic Management?  No   Diabetic Exams:  Diabetic Eye Exam: Completed 10/04/2021 Diabetic Foot Exam: Completed 05/04/2021   Interpreter Needed?: No  Information entered by :: NAllen LPN   Activities of Daily Living In your present state of health, do you have any difficulty performing the following activities: 11/17/2021 07/11/2021  Hearing? N -  Vision? N -  Comment - -  Difficulty concentrating or making decisions? N -  Walking or climbing stairs? N -  Comment - -  Dressing or bathing? N -  Doing errands, shopping? N Y  Conservation officer, nature and eating ? N -  Using the Toilet? N -  In the past six months, have you accidently leaked urine? N -  Do you have problems with loss of bowel control? N -  Managing your Medications? N -  Managing your Finances? N -  Housekeeping or managing your Housekeeping? N -  Some recent data might be hidden    Patient Care Team: Tysinger, Camelia Eng, PA-C as PCP - General (Family Medicine) Skeet Latch, MD as PCP - Cardiology (Cardiology)  Indicate any recent Medical Services you may have received from other than [facility] providers in the past year (date may be approximate).     Assessment:   This is a routine wellness examination for  [place].  Hearing/Vision screen Vision Screening - Comments:: Regular eye exams, Dr. Zadie Rhine  Dietary issues and exercise activities discussed: Current  Exercise Habits: Home exercise routine, Type of exercise: walking, Time (Minutes): 20, Frequency (Times/Week): 3, Weekly Exercise (Minutes/Week): 60   Goals Addressed             This Visit's Progress    Patient Stated       11/17/2021, stay active       Depression Screen PHQ 2/9 Scores 11/17/2021 10/18/2021 08/30/2021 07/20/2021 05/04/2021  PHQ - 2 Score 0 0 0 0 0    Fall Risk Fall Risk  11/17/2021 10/18/2021 08/30/2021 07/20/2021 05/04/2021  Falls in the past year? 0 0 0 0 0  Number falls in past yr: - 0 - 0 0  Injury with Fall? - 0 - 0 0  Risk for fall due to : Medication side effect No Fall Risks - No Fall Risks No Fall Risks  Follow up Falls evaluation completed;Education provided;Falls prevention discussed Falls evaluation completed Falls evaluation completed;Education provided - Falls evaluation completed    FALL RISK PREVENTION PERTAINING TO THE HOME:  Any stairs in or around the home? Yes  If so, are there any without handrails? No  Home free of loose throw rugs in walkways, pet beds, electrical cords, etc? Yes  Adequate lighting in your home to reduce risk of falls? Yes   ASSISTIVE DEVICES UTILIZED TO PREVENT FALLS:  Life alert? No  Use of a cane, walker or w/c? No  Grab bars in the bathroom? Yes  Shower chair or bench in shower? No  Elevated toilet seat or a handicapped toilet? Yes   TIMED UP AND GO:  Was the test performed? No .      Cognitive Function:     6CIT Screen 11/17/2021  What Year? 0 points  What month? 0 points  What time? 0 points  Count back from 20 0 points  Months in reverse 0 points  Repeat phrase 0 points  Total Score 0    Immunizations Immunization History  Administered Date(s) Administered   PFIZER(Purple Top)SARS-COV-2 Vaccination 02/09/2020, 03/01/2020, 11/05/2020    Pneumococcal Polysaccharide-23 05/04/2021   Tdap 07/11/2021    TDAP status: Up to date  Flu Vaccine status: Declined, Education has been provided regarding the importance of this vaccine but patient still declined. Advised may receive this vaccine at local pharmacy or Health Dept. Aware to provide a copy of the vaccination record if obtained from local pharmacy or Health Dept. Verbalized acceptance and understanding.  Pneumococcal vaccine status: Up to date  Covid-19 vaccine status: Completed vaccines  Qualifies for Shingles Vaccine? Yes   Zostavax completed No   Shingrix Completed?: No.    Education has been provided regarding the importance of this vaccine. Patient has been advised to call insurance company to determine out of pocket expense if they have not yet received this vaccine. Advised may also receive vaccine at local pharmacy or Health Dept. Verbalized acceptance and understanding.  Screening Tests Health Maintenance  Topic Date Due   Zoster Vaccines- Shingrix (1 of 2) Never done   PAP SMEAR-Modifier  Never done   COVID-19 Vaccine (4 - Booster for Pfizer series) 12/31/2020   DEXA SCAN  Never done   HEMOGLOBIN A1C  11/03/2021   COLONOSCOPY (Pts 45-60yrs Insurance coverage will need to be confirmed)  08/17/2022 (Originally 05/07/2001)   Pneumonia Vaccine 60+ Years old (2 - PCV) 05/04/2022   FOOT EXAM  05/04/2022   OPHTHALMOLOGY EXAM  10/04/2022   MAMMOGRAM  05/12/2023   TETANUS/TDAP  07/12/2031   Hepatitis C Screening  Completed  HIV Screening  Completed   HPV VACCINES  Aged Out   INFLUENZA VACCINE  Discontinued    Health Maintenance  Health Maintenance Due  Topic Date Due   Zoster Vaccines- Shingrix (1 of 2) Never done   PAP SMEAR-Modifier  Never done   COVID-19 Vaccine (4 - Booster for Pfizer series) 12/31/2020   DEXA SCAN  Never done   HEMOGLOBIN A1C  11/03/2021    Colorectal cancer screening: due  Mammogram status: Completed 05/11/2021. Repeat every  year  Bone Density status: decline at this time  Lung Cancer Screening: (Low Dose CT Chest recommended if Age 74-80 years, 30 pack-year currently smoking OR have quit w/in 15years.) does not qualify.   Lung Cancer Screening Referral: no  Additional Screening:  Hepatitis C Screening: does qualify; Completed 03/29/2021  Vision Screening: Recommended annual ophthalmology exams for early detection of glaucoma and other disorders of the eye. Is the patient up to date with their annual eye exam?  Yes  Who is the provider or what is the name of the office in which the patient attends annual eye exams? Dr. Zadie Rhine If pt is not established with a provider, would they like to be referred to a provider to establish care? No .   Dental Screening: Recommended annual dental exams for proper oral hygiene  Community Resource Referral / Chronic Care Management: CRR required this visit?  No   CCM required this visit?  No      Plan:     I have personally reviewed and noted the following in the patients chart:   Medical and social history Use of alcohol, tobacco or illicit drugs  Current medications and supplements including opioid prescriptions. Patient is currently taking opioid prescriptions. Information provided to patient regarding non-opioid alternatives. Patient advised to discuss non-opioid treatment plan with their provider. Functional ability and status Nutritional status Physical activity Advanced directives List of other physicians Hospitalizations, surgeries, and ER visits in previous 12 months Vitals Screenings to include cognitive, depression, and falls Referrals and appointments  In addition, I have reviewed and discussed with patient certain preventive protocols, quality metrics, and best practice recommendations. A written personalized care plan for preventive services as well as general preventive health recommendations were provided to patient.     Kellie Simmering,  LPN   [id number]   Nurse Notes: none

## 2021-11-17 NOTE — Patient Instructions (Signed)
Maria Foster , Thank you for taking time to come for your Medicare Wellness Visit. I appreciate your ongoing commitment to your health goals. Please review the following plan we discussed and let me know if I can assist you in the future.   Screening recommendations/referrals: Colonoscopy: due Mammogram: completed 05/11/2021 Bone Density: wants to discuss with Maria Foster Recommended yearly ophthalmology/optometry visit for glaucoma screening and checkup Recommended yearly dental visit for hygiene and checkup  Vaccinations: Influenza vaccine: decline Pneumococcal vaccine: completed 05/04/2021 Tdap vaccine: completed 07/11/2021, due 07/12/2031 Shingles vaccine: discussed   Covid-19: 11/05/2020, 03/01/2020, 02/09/2020  Advanced directives: Please bring a copy of your POA (Power of Attorney) and/or Living Will to your next appointment.   Conditions/risks identified: none  Next appointment: Follow up in one year for your annual wellness visit    Preventive Care 65 Years and Older, Female Preventive care refers to lifestyle choices and visits with your health care provider that can promote health and wellness. What does preventive care include? A yearly physical exam. This is also called an annual well check. Dental exams once or twice a year. Routine eye exams. Ask your health care provider how often you should have your eyes checked. Personal lifestyle choices, including: Daily care of your teeth and gums. Regular physical activity. Eating a healthy diet. Avoiding tobacco and drug use. Limiting alcohol use. Practicing safe sex. Taking low-dose aspirin every day. Taking vitamin and mineral supplements as recommended by your health care provider. What happens during an annual well check? The services and screenings done by your health care provider during your annual well check will depend on your age, overall health, lifestyle risk factors, and family history of disease. Counseling  Your  health care provider may ask you questions about your: Alcohol use. Tobacco use. Drug use. Emotional well-being. Home and relationship well-being. Sexual activity. Eating habits. History of falls. Memory and ability to understand (cognition). Work and work Astronomer. Reproductive health. Screening  You may have the following tests or measurements: Height, weight, and BMI. Blood pressure. Lipid and cholesterol levels. These may be checked every 5 years, or more frequently if you are over 64 years old. Skin check. Lung cancer screening. You may have this screening every year starting at age 58 if you have a 30-pack-year history of smoking and currently smoke or have quit within the past 15 years. Fecal occult blood test (FOBT) of the stool. You may have this test every year starting at age 72. Flexible sigmoidoscopy or colonoscopy. You may have a sigmoidoscopy every 5 years or a colonoscopy every 10 years starting at age 73. Hepatitis C blood test. Hepatitis B blood test. Sexually transmitted disease (STD) testing. Diabetes screening. This is done by checking your blood sugar (glucose) after you have not eaten for a while (fasting). You may have this done every 1-3 years. Bone density scan. This is done to screen for osteoporosis. You may have this done starting at age 24. Mammogram. This may be done every 1-2 years. Talk to your health care provider about how often you should have regular mammograms. Talk with your health care provider about your test results, treatment options, and if necessary, the need for more tests. Vaccines  Your health care provider may recommend certain vaccines, such as: Influenza vaccine. This is recommended every year. Tetanus, diphtheria, and acellular pertussis (Tdap, Td) vaccine. You may need a Td booster every 10 years. Zoster vaccine. You may need this after age 7. Pneumococcal 13-valent conjugate (PCV13) vaccine. One  dose is recommended after age  23. Pneumococcal polysaccharide (PPSV23) vaccine. One dose is recommended after age 64. Talk to your health care provider about which screenings and vaccines you need and how often you need them. This information is not intended to replace advice given to you by your health care provider. Make sure you discuss any questions you have with your health care provider. Document Released: 12/02/2015 Document Revised: 07/25/2016 Document Reviewed: 09/06/2015 Elsevier Interactive Patient Education  2017 [employer] Prevention in the Home Falls can cause injuries. They can happen to people of all ages. There are many things you can do to make your home safe and to help prevent falls. What can I do on the outside of my home? Regularly fix the edges of walkways and driveways and fix any cracks. Remove anything that might make you trip as you walk through a door, such as a raised step or threshold. Trim any bushes or trees on the path to your home. Use bright outdoor lighting. Clear any walking paths of anything that might make someone trip, such as rocks or tools. Regularly check to see if handrails are loose or broken. Make sure that both sides of any steps have handrails. Any raised decks and porches should have guardrails on the edges. Have any leaves, snow, or ice cleared regularly. Use sand or salt on walking paths during winter. Clean up any spills in your garage right away. This includes oil or grease spills. What can I do in the bathroom? Use night lights. Install grab bars by the toilet and in the tub and shower. Do not use towel bars as grab bars. Use non-skid mats or decals in the tub or shower. If you need to sit down in the shower, use a plastic, non-slip stool. Keep the floor dry. Clean up any water that spills on the floor as soon as it happens. Remove soap buildup in the tub or shower regularly. Attach bath mats securely with double-sided non-slip rug tape. Do not have throw  rugs and other things on the floor that can make you trip. What can I do in the bedroom? Use night lights. Make sure that you have a light by your bed that is easy to reach. Do not use any sheets or blankets that are too big for your bed. They should not hang down onto the floor. Have a firm chair that has side arms. You can use this for support while you get dressed. Do not have throw rugs and other things on the floor that can make you trip. What can I do in the kitchen? Clean up any spills right away. Avoid walking on wet floors. Keep items that you use a lot in easy-to-reach places. If you need to reach something above you, use a strong step stool that has a grab bar. Keep electrical cords out of the way. Do not use floor polish or wax that makes floors slippery. If you must use wax, use non-skid floor wax. Do not have throw rugs and other things on the floor that can make you trip. What can I do with my stairs? Do not leave any items on the stairs. Make sure that there are handrails on both sides of the stairs and use them. Fix handrails that are broken or loose. Make sure that handrails are as long as the stairways. Check any carpeting to make sure that it is firmly attached to the stairs. Fix any carpet that is loose or worn.  Avoid having throw rugs at the top or bottom of the stairs. If you do have throw rugs, attach them to the floor with carpet tape. Make sure that you have a light switch at the top of the stairs and the bottom of the stairs. If you do not have them, ask someone to add them for you. What else can I do to help prevent falls? Wear shoes that: Do not have high heels. Have rubber bottoms. Are comfortable and fit you well. Are closed at the toe. Do not wear sandals. If you use a stepladder: Make sure that it is fully opened. Do not climb a closed stepladder. Make sure that both sides of the stepladder are locked into place. Ask someone to hold it for you, if  possible. Clearly mark and make sure that you can see: Any grab bars or handrails. First and last steps. Where the edge of each step is. Use tools that help you move around (mobility aids) if they are needed. These include: Canes. Walkers. Scooters. Crutches. Turn on the lights when you go into a dark area. Replace any light bulbs as soon as they burn out. Set up your furniture so you have a clear path. Avoid moving your furniture around. If any of your floors are uneven, fix them. If there are any pets around you, be aware of where they are. Review your medicines with your doctor. Some medicines can make you feel dizzy. This can increase your chance of falling. Ask your doctor what other things that you can do to help prevent falls. This information is not intended to replace advice given to you by your health care provider. Make sure you discuss any questions you have with your health care provider. Document Released: 09/01/2009 Document Revised: 04/12/2016 Document Reviewed: 12/10/2014 Elsevier Interactive Patient Education  2017 [employer].

## 2021-11-22 ENCOUNTER — Encounter (INDEPENDENT_AMBULATORY_CARE_PROVIDER_SITE_OTHER): Payer: Self-pay | Admitting: Ophthalmology

## 2021-11-22 ENCOUNTER — Other Ambulatory Visit: Payer: Self-pay

## 2021-11-22 ENCOUNTER — Ambulatory Visit (INDEPENDENT_AMBULATORY_CARE_PROVIDER_SITE_OTHER): Payer: Medicare Other | Admitting: Ophthalmology

## 2021-11-22 DIAGNOSIS — E113591 Type 2 diabetes mellitus with proliferative diabetic retinopathy without macular edema, right eye: Secondary | ICD-10-CM | POA: Diagnosis not present

## 2021-11-22 NOTE — Progress Notes (Signed)
11/22/2021     CHIEF COMPLAINT Patient presents for  Chief Complaint  Patient presents with   Retina Follow Up      HISTORY OF PRESENT ILLNESS: Maria Foster is a 66 y.o. female who presents to the clinic today for:   HPI     Retina Follow Up           Diagnosis: Diabetic Retinopathy   Laterality: right eye   Onset: 1 week ago   Severity: mild   Duration: 1 week   Course: stable         Comments   1 week prp OD  Pt states VA OU stable since last visit. Pt denies FOL, floaters, or ocular pain OU.  Pt states, "I can still see about the same. When I had laser last week on my left eye it was a better experience than I had before with laser and I felt like I could see better."        Last edited by Kendra Opitz, COA on 11/22/2021  9:09 AM.      Referring physician: Carlena Hurl, PA-C [place],  [place] [id number]  HISTORICAL INFORMATION:   Selected notes from the MEDICAL RECORD NUMBER    Lab Results  Component Value Date   HGBA1C 7.0 (H) 05/04/2021     CURRENT MEDICATIONS: No current outpatient medications on file. (Ophthalmic Drugs)   No current facility-administered medications for this visit. (Ophthalmic Drugs)   Current Outpatient Medications (Other)  Medication Sig   ACCU-CHEK GUIDE test strip See admin instructions.   Accu-Chek Softclix Lancets lancets See admin instructions.   acetaminophen (TYLENOL) 325 MG tablet Take 1-2 tablets (325-650 mg total) by mouth every 6 (six) hours as needed for mild pain (pain score 1-3 or temp > 100.5).   aspirin EC 81 MG tablet Take 81 mg by mouth every Monday, Wednesday, and Friday.   atorvastatin (LIPITOR) 40 MG tablet Take 1 tablet (40 mg total) by mouth daily.   benzonatate (TESSALON) 200 MG capsule Take 1 capsule (200 mg total) by mouth 3 (three) times daily as needed for cough. (Patient not taking: Reported on 11/17/2021)   bisacodyl (DULCOLAX) 5 MG EC tablet Take 1 tablet (5 mg total)  by mouth daily as needed for moderate constipation.   Blood Glucose Monitoring Suppl (ACCU-CHEK GUIDE ME) w/Device KIT See admin instructions.   dapagliflozin propanediol (FARXIGA) 10 MG TABS tablet Take 1 tablet (10 mg total) by mouth daily before breakfast.   docusate sodium (COLACE) 100 MG capsule Take 100 mg by mouth daily as needed for mild constipation.   ferrous gluconate (FERGON) 324 MG tablet Take 1 tablet (324 mg total) by mouth 2 (two) times daily with a meal.   furosemide (LASIX) 40 MG tablet Take 0.5 tablets (20 mg total) by mouth daily.   guaiFENesin (MUCINEX) 600 MG 12 hr tablet Take 1 tablet (600 mg total) by mouth 2 (two) times daily.   hydrALAZINE (APRESOLINE) 50 MG tablet Take 1 tablet (50 mg total) by mouth every 8 (eight) hours.   HYDROcodone-acetaminophen (NORCO/VICODIN) 5-325 MG tablet Take 1-2 tablets by mouth every 6 (six) hours as needed for severe pain.   hydroxychloroquine (PLAQUENIL) 200 MG tablet Take 1 tablet (200 mg total) by mouth daily.   isosorbide mononitrate (IMDUR) 60 MG 24 hr tablet Take 1 tablet (60 mg total) by mouth daily.   linagliptin (TRADJENTA) 5 MG TABS tablet Take 1 tablet (5 mg total)  by mouth daily.   metoprolol succinate (TOPROL-XL) 50 MG 24 hr tablet TAKE 1 TABLET(50 MG) BY MOUTH DAILY WITH OR IMMEDIATELY FOLLOWING A MEAL   oseltamivir (TAMIFLU) 75 MG capsule Take 1 capsule (75 mg total) by mouth 2 (two) times daily. (Patient not taking: Reported on 11/17/2021)   polyethylene glycol (MIRALAX / GLYCOLAX) 17 g packet Take 17 g by mouth daily as needed for mild constipation.   potassium chloride SA (KLOR-CON) 20 MEQ tablet Take 2 tablets (40 mEq total) by mouth 2 (two) times daily.   sacubitril-valsartan (ENTRESTO) 97-103 MG Take 1 tablet by mouth 2 (two) times daily.   spironolactone (ALDACTONE) 25 MG tablet Take 1 tablet (25 mg total) by mouth daily.   Vitamin D, Ergocalciferol, (DRISDOL) 1.25 MG (50000 UNIT) CAPS capsule Take 50,000 Units by mouth  every Friday.   No current facility-administered medications for this visit. (Other)      REVIEW OF SYSTEMS:    ALLERGIES Allergies  Allergen Reactions   Metformin And Related Other (See Comments)    Kidney injury, GI upset, diarrhea    PAST MEDICAL HISTORY Past Medical History:  Diagnosis Date   Arthritis    Cataract    Diabetes mellitus type 2 with complications ([place])    diagnosed probably 2012 per patient   Diabetic retinopathy ([place])    Hypertension    Rheumatoid arthritis ([place])    Past Surgical History:  Procedure Laterality Date   INCISION AND DRAINAGE ABSCESS N/A 07/11/2021   Procedure: INCISION AND DRAINAGE RIGHT ELBOW;  Surgeon: Paralee Cancel, MD;  Location: [facility];  Service: Orthopedics;  Laterality: N/A;   PRP Bilateral OD - 07/23/2017 OS - 08/06/2017   RIGHT/LEFT HEART CATH AND CORONARY ANGIOGRAPHY N/A 01/05/2021   Procedure: RIGHT/LEFT HEART CATH AND CORONARY ANGIOGRAPHY;  Surgeon: Jolaine Artist, MD;  Location: [facility];  Service: Cardiovascular;  Laterality: N/A;   TEE WITHOUT CARDIOVERSION N/A 10/09/2021   Procedure: TRANSESOPHAGEAL ECHOCARDIOGRAM (TEE);  Surgeon: Jolaine Artist, MD;  Location: [facility] ENDOSCOPY;  Service: Cardiovascular;  Laterality: N/A;    FAMILY HISTORY Family History  Problem Relation Age of Onset   Diabetes Mother    Hypertension Mother    Heart Problems Sister    Amblyopia Neg Hx    Blindness Neg Hx    Cataracts Neg Hx    Glaucoma Neg Hx    Macular degeneration Neg Hx    Retinal detachment Neg Hx    Strabismus Neg Hx    Retinitis pigmentosa Neg Hx     SOCIAL HISTORY Social History   Tobacco Use   Smoking status: Former    Packs/day: 0.50    Years: 15.00    Pack years: 7.50    Types: Cigarettes    Quit date: 11/19/2020    Years since quitting: 1.0   Smokeless tobacco: Never  Vaping Use   Vaping Use: Never used  Substance Use Topics   Alcohol use: Not Currently   Drug use: No          OPHTHALMIC EXAM:  Base Eye Exam     Visual Acuity (ETDRS)       Right Left   Dist [place] 20/20 -1 20/30   Dist ph [place]  20/25 -1         Tonometry (Tonopen, 9:13 AM)       Right Left   Pressure 15 15         Pupils       Pupils  Dark Light Shape React APD   Right PERRL 3 3 Round Minimal None   Left PERRL 3 3 Round Minimal None         Visual Fields (Counting fingers)       Left Right    Full Full         Extraocular Movement       Right Left    Full Full         Neuro/Psych     Oriented x3: Yes   Mood/Affect: Normal         Dilation     Right eye: 1.0% Mydriacyl, 2.5% Phenylephrine @ 9:12 AM           Slit Lamp and Fundus Exam     External Exam       Right Left   External Normal Normal         Slit Lamp Exam       Right Left   Lids/Lashes Normal Normal   Conjunctiva/Sclera Temporal and Nasal Pinguecula; racial melanosis Temporal and Nasal Pinguecula; racial melanosis   Cornea Clear Clear   Anterior Chamber Deep and quiet Deep and quiet   Iris dilated to 34mm; no NVI dilated to 6.35mm; no NVI   Lens 2+ Nuclear sclerosis, 3+ Cortical cataract 2+ Nuclear sclerosis, 3+ Cortical cataract   Anterior Vitreous Vitreous syneresis Vitreous syneresis; small clumps of VH inferiorlly; VH settling inferiorly          Fundus Exam       Right Left   Posterior Vitreous No PVD, small scattered preretinal hemorrhages inferiorly No PVD, small scattered preretinal hemorrhages inferiorly   Disc NVD extension along the inferotemporal arcade appears fibrotic reactive Massive NVD, greater than 12 disc areas in size   C/D Ratio 0.6 0.5   Macula DBH; early NVE temporally; with fibrous changes large region of nontreated retina in this area cotton wool spots sup; macular neovascularization temporal aspect of the macula   Vessels NVE along inferiortemp  arcade, superonasal to disc NVE at 1030, peripherally at equator   [facility]; attached; scattered dot  and blot hemorrhages 360; scattered PRP 360 w/ room for fill-in; pre-retinal heme above disc;  DBH; attached; scattered dot and blot hemorrhages 360; PRP laser, room for fill in temporally and inferiorly;             IMAGING AND PROCEDURES  Imaging and Procedures for 11/22/21  OCT, Retina - OU - Both Eyes       Right Eye Quality was good. Scan locations included subfoveal. Central Foveal Thickness: 258. Progression has been stable. Findings include no IRF, no SRF, abnormal foveal contour, epiretinal membrane, vitreomacular adhesion , vitreous traction (Mild inf edema).   Left Eye Quality was good. Scan locations included subfoveal. Central Foveal Thickness: 283. Progression has been stable. Findings include no SRF, no IRF, abnormal foveal contour, epiretinal membrane, vitreous traction, vitreomacular adhesion .   Notes Images taken, stored on drive   Diagnosis / Impression:  No diabetic macular edema  Clinical management:  See below  Abbreviations: NFP - Normal foveal profile. CME - cystoid macular edema. PED - pigment epithelial detachment. IRF - intraretinal fluid. SRF - subretinal fluid. EZ - ellipsoid zone. ERM - epiretinal membrane. ORA - outer retinal atrophy. ORT - outer retinal tubulation. SRHM - subretinal hyper-reflective material       Panretinal Photocoagulation - OD - Right Eye       Time Out Confirmed correct patient, procedure, site,  and patient consented.   Anesthesia Topical anesthesia was used. Anesthetic medications included Proparacaine 0.5%.   Laser Information The type of laser was diode. Color was yellow. The duration in seconds was 0.04. The spot size was 390 microns. Laser power was 300. Total spots was 689.   Post-op The patient tolerated the procedure well. There were no complications. The patient received written and verbal post procedure care education.   Notes LASER PROCEDURE NOTE  Diagnosis:   Proliferative Diabetic Retinopathy, RIGHT  EYE  Procedure:  Pan-retinal photocoagulation using slit lamp laser, RIGHT EYE, temporal to the macular region treatment completed  esia:  Topical,   Surgeon: Keah Lamba, MD   Informed consent obtained, operative eye marked, and time out performed prior to initiation of laser.   Complications: None.  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     ICD-10-CM   1. 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  1 week post PRP OS additional new changes  2.  OD for completion of PRP temporal portion of macula and inferiorly.  NO Treatment within 2 disc diameters of the macular region  3.  Ophthalmic Meds Ordered this visit:  No orders of the defined types were placed in this encounter.      Return in about 4 weeks (around 12/20/2021) for DILATE OU, COLOR FP, OCT.  There are no Patient Instructions on file for this visit.   Explained the diagnoses, plan, and follow up with the patient and they expressed understanding.  Patient expressed understanding of the importance of proper follow up care.   Clent Demark Suni Jarnagin M.D. Diseases & Surgery of the Retina and Vitreous Retina & Diabetic [place] 11/22/21     Abbreviations: M myopia (nearsighted); A astigmatism; H hyperopia (farsighted); P presbyopia; Mrx spectacle prescription;  CTL contact lenses; OD right eye; OS left eye; OU both eyes  XT exotropia; ET esotropia; PEK punctate epithelial keratitis; PEE punctate epithelial erosions; DES dry eye syndrome; MGD meibomian gland dysfunction; ATs artificial tears; PFAT's preservative free artificial tears; [place] nuclear sclerotic  cataract; PSC posterior subcapsular cataract; ERM epi-retinal membrane; PVD posterior vitreous detachment; RD retinal detachment; DM diabetes mellitus; DR diabetic retinopathy; NPDR non-proliferative diabetic retinopathy; PDR proliferative diabetic retinopathy; CSME clinically significant macular edema; DME diabetic macular edema; dbh dot blot hemorrhages; CWS cotton wool spot; POAG primary open angle glaucoma; C/D cup-to-disc ratio; HVF humphrey visual field; GVF goldmann visual field; OCT optical coherence tomography; IOP intraocular pressure; BRVO Branch retinal vein occlusion; CRVO central retinal vein occlusion; CRAO central retinal artery occlusion; BRAO branch retinal artery occlusion; RT retinal tear; SB scleral buckle; PPV pars plana vitrectomy; VH Vitreous hemorrhage; PRP panretinal laser photocoagulation; IVK intravitreal kenalog; VMT vitreomacular traction; MH Macular hole;  NVD neovascularization of the disc; NVE neovascularization elsewhere; AREDS age related eye disease study; ARMD age related macular degeneration; POAG primary open angle glaucoma; EBMD epithelial/anterior basement membrane dystrophy; ACIOL anterior chamber intraocular lens; IOL intraocular lens; PCIOL posterior chamber intraocular lens; Phaco/IOL phacoemulsification with intraocular lens placement; [place] photorefractive keratectomy; LASIK laser assisted in situ keratomileusis; HTN hypertension; DM diabetes mellitus;  COPD chronic obstructive pulmonary disease

## 2021-11-22 NOTE — Assessment & Plan Note (Signed)
Posterior pole PRP completed temporal macula and inferiorly.

## 2021-11-23 NOTE — Telephone Encounter (Signed)
Never received any response and Due to recent guidelines, will not be covered due to pt is not insulin dependent

## 2021-11-26 ENCOUNTER — Other Ambulatory Visit: Payer: Self-pay | Admitting: Medical

## 2021-12-01 ENCOUNTER — Other Ambulatory Visit: Payer: Self-pay

## 2021-12-01 ENCOUNTER — Ambulatory Visit (HOSPITAL_COMMUNITY)
Admission: RE | Admit: 2021-12-01 | Discharge: 2021-12-01 | Disposition: A | Discharge status: Home / Self Care | Payer: Medicare Other | Source: Ambulatory Visit | Attending: Internal Medicine | Admitting: Internal Medicine

## 2021-12-01 VITALS — BP 130/60 | HR 63 | Wt 131.8 lb

## 2021-12-01 DIAGNOSIS — M069 Rheumatoid arthritis, unspecified: Secondary | ICD-10-CM | POA: Insufficient documentation

## 2021-12-01 DIAGNOSIS — I130 Hypertensive heart and chronic kidney disease with heart failure and stage 1 through stage 4 chronic kidney disease, or unspecified chronic kidney disease: Secondary | ICD-10-CM | POA: Insufficient documentation

## 2021-12-01 DIAGNOSIS — E1136 Type 2 diabetes mellitus with diabetic cataract: Secondary | ICD-10-CM | POA: Insufficient documentation

## 2021-12-01 DIAGNOSIS — Z7982 Long term (current) use of aspirin: Secondary | ICD-10-CM | POA: Diagnosis not present

## 2021-12-01 DIAGNOSIS — Z7901 Long term (current) use of anticoagulants: Secondary | ICD-10-CM | POA: Insufficient documentation

## 2021-12-01 DIAGNOSIS — I5040 Unspecified combined systolic (congestive) and diastolic (congestive) heart failure: Secondary | ICD-10-CM | POA: Diagnosis not present

## 2021-12-01 DIAGNOSIS — Z79899 Other long term (current) drug therapy: Secondary | ICD-10-CM | POA: Diagnosis not present

## 2021-12-01 DIAGNOSIS — G4733 Obstructive sleep apnea (adult) (pediatric): Secondary | ICD-10-CM

## 2021-12-01 DIAGNOSIS — I2510 Atherosclerotic heart disease of native coronary artery without angina pectoris: Secondary | ICD-10-CM | POA: Insufficient documentation

## 2021-12-01 DIAGNOSIS — G4730 Sleep apnea, unspecified: Secondary | ICD-10-CM | POA: Insufficient documentation

## 2021-12-01 DIAGNOSIS — N1831 Chronic kidney disease, stage 3a: Secondary | ICD-10-CM | POA: Diagnosis not present

## 2021-12-01 DIAGNOSIS — E1122 Type 2 diabetes mellitus with diabetic chronic kidney disease: Secondary | ICD-10-CM | POA: Diagnosis not present

## 2021-12-01 DIAGNOSIS — I5032 Chronic diastolic (congestive) heart failure: Secondary | ICD-10-CM

## 2021-12-01 DIAGNOSIS — I10 Essential (primary) hypertension: Secondary | ICD-10-CM

## 2021-12-01 DIAGNOSIS — Z720 Tobacco use: Secondary | ICD-10-CM | POA: Insufficient documentation

## 2021-12-01 LAB — BASIC METABOLIC PANEL
Anion gap: 10 (ref 5–15)
BUN: 15 mg/dL (ref 8–23)
CO2: 23 mmol/L (ref 22–32)
Calcium: 9.3 mg/dL (ref 8.9–10.3)
Chloride: 107 mmol/L (ref 98–111)
Creatinine, Ser: 1 mg/dL (ref 0.44–1.00)
GFR, Estimated: >60 mL/min (ref >60)
Glucose, Bld: 119 mg/dL — ABNORMAL HIGH (ref 70–99)
Potassium: 4.2 mmol/L (ref 3.5–5.1)
Sodium: 140 mmol/L (ref 135–145)

## 2021-12-01 MED ORDER — FUROSEMIDE 40 MG PO TABS: 20.0000 mg | ORAL_TABLET | Freq: Two times a day (BID) | ORAL | 11 refills | Status: DC

## 2021-12-01 MED ORDER — FUROSEMIDE 40 MG PO TABS: 40.0000 mg | ORAL_TABLET | Freq: Every day | ORAL | 11 refills | Status: DC

## 2021-12-01 NOTE — Patient Instructions (Signed)
Medication Changes:  Stop Farxiga  Increase Lasix to 40mg  daily  Lab Work:  Labs done today, your results will be available in MyChart, we will contact you for abnormal readings.   Testing/Procedures:  Your physician has requested that you have an echocardiogram. Echocardiography is a painless test that uses sound waves to create images of your heart. It provides your doctor with information about the size and shape of your heart and how well your hearts chambers and valves are working. This procedure takes approximately one hour. There are no restrictions for this procedure.   Referrals:  none  Special Instructions // Education:  none  Follow-Up in: 6 months (July 2023) ** Call the office in June 2023 for appointment**  At the [facility], you and your health needs are our priority. We have a designated team specialized in the treatment of Heart Failure. This Care Team includes your primary Heart Failure Specialized Cardiologist (physician), Advanced Practice Providers (APPs- Physician Assistants and Nurse Practitioners), and Pharmacist who all work together to provide you with the care you need, when you need it.   You may see any of the following providers on your designated Care Team at your next follow up:  Dr Arvilla Meres Dr Carron Curie, NP Robbie Lis, [place] [facility], [place] [name], PharmD   Please be sure to bring in all your medications bottles to every appointment.   Need to Contact us:  If you have any questions or concerns before your next appointment please send [place] a message through [place] or call our office at [phone].    TO LEAVE A MESSAGE FOR THE NURSE SELECT OPTION 2, PLEASE LEAVE A MESSAGE INCLUDING: YOUR NAME DATE OF BIRTH CALL BACK NUMBER REASON FOR CALL**this is important as we prioritize the call backs  YOU WILL RECEIVE A CALL BACK THE SAME DAY AS LONG AS YOU CALL BEFORE 4:00  PM

## 2021-12-01 NOTE — Progress Notes (Addendum)
[facility]  PCP: Dr Elonda Husky  Cardiologist: Dr. Oval Linsey HF Cardiologist: Dr. Haroldine Laws  Reason for Visit: Heart Failure   HPI: Maria Foster is a 66 y.o.  female  with DM2, HTN, obesity, CKD 3a , CAD, MR, chornic systolic HF, RA, and tobacco use.        Admitted 12/19/20 with new onset HF. On admit also hypertensive with AKI. 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 She was diuresed with IV lasix and cardiology was consulted. Discharge weight was 144.5 lbs. Underwent cath 2/22 with 3v CAD with totally occluded RCA with L -> R collaterals, 80% lesion in mLCX and non-obstructive CAD in LAD. EF 40% with elevated filling pressures and normal CO. Given lack of angina, she was treated medically. 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  Had return f/u 6/22. Echo showed improved EF up to 55%. 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 Had run out of hydralazine and spiro. She was stable from symptom standpoint, NYHA Class II. Hydral and spiro were refilled and Entresto increased to 97-103 bid. Referral was placed to be seen in the HTN clinic. It appears they attempted to contact her for appt but were unsuccessfull in reaching the patient.   She was admitted to [facility] 8/22 for septic arthritis involving the rt elbow. This was after receiving steroid injection. Synovial fluid cx grew staph aureus. Blood cultures negative. Taken for washout and placed on IV abx x 4 weeks. Completed antibiotics.  Echo 11/22: EF 55%. Mod -severe MR Mild AI.   TEE 10/09/21: EF 55%, RV okay, mildly rheumatic MV with mild to moderate MR (SBP 140-170), mild AI  Here today for HF f/u. Reports she has been taking all of her medications as prescribed. Home blood pressures have come down significantly. Denies CP or shortness of breath. Has been walking in her yard for about  15 minutes most days. No orthopnea or LE edema. Denies dizziness. Only complaint is frequent yeast infections since she started [place]. Has only temporary relief after courses of fluconazole.  Planning to follow-up with sleep medicine after she gets a colonoscopy.   Continues to smoke several cigarettes a day. Her husband just quite cold [place]. She is planning to discuss smoking cessation aids with her PCP at f/u in a couple of weeks.    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  Echo 11/22: EF 55%. Mod -severe MR Mild AI TEE 10/09/21: EF 55%, RV okay, mildly rheumatic MV with mild to moderate MR (SBP 140-170), mild AI  R/LHC (2/22): Ost RCA to Prox RCA lesion is 100% stenosed. Mid Cx lesion is 80% stenosed. 1st Mrg lesion is 100% stenosed. Ramus lesion is 30% stenosed. Mid LM to Dist LM lesion is 20% stenosed. Prox LAD lesion is 30% stenosed. Dist RCA lesion is 100% stenosed. Findings: Ao = 154.73 (104) LV = 143/20 RA =  13 RV = 65/15 PA = 60/24 (37)  PCW = 27 (v = 40) Fick cardiac output/index = 4.7/2.7 PVR = 2.1 WU SVR = 1552 Ao sat = 97% PA sat = 64%, 64% Assessment: 1. 3v CAD  with totally occluded RCA with L -> R collaterals 2. 80% lesion in mLCX 3. Mild non-obstructive CAD in LAD 4. 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 Treat medically for now. If develops anginal symptoms can consider PCI mLCX. Needs diuresis first.   ROS: All systems negative except as listed in HPI, PMH and Problem List.  SH:  Social History   Socioeconomic History   Marital status: Married    Spouse name: Not on file   Number of children: Not on file   Years of education: Not on file   Highest education level: Not on file   Occupational History   Occupation: retired  Tobacco Use   Smoking status: Former    Packs/day: 0.50    Years: 15.00    Pack years: 7.50    Types: Cigarettes    Quit date: 11/19/2020    Years since quitting: 1.0   Smokeless tobacco: Never  Vaping Use   Vaping Use: Never used  Substance and Sexual Activity   Alcohol use: Not Currently   Drug use: No   Sexual activity: Not on file  Other Topics Concern   Not on file  Social History Narrative   Not on file   Social Determinants of Health   Financial Resource Strain: Low Risk    Difficulty of Paying Living Expenses: Not hard at all  Food Insecurity: No Food Insecurity   Worried About Charity fundraiser in the Last Year: Never true   [place] in the Last Year: Never true  Transportation Needs: No Transportation Needs   Lack of Transportation (Medical): No   Lack of Transportation (Non-Medical): No  Physical Activity: Inactive   Days of Exercise per Week: 0 days   Minutes of Exercise per Session: 0 min  Stress: No Stress Concern Present   Feeling of Stress : Not at all  Social Connections: Not on file  Intimate Partner Violence: Not on file   FH:  Family History  Problem Relation Age of Onset   Diabetes Mother    Hypertension Mother    Heart Problems Sister    Amblyopia Neg Hx    Blindness Neg Hx    Cataracts Neg Hx    Glaucoma Neg Hx    Macular degeneration Neg Hx    Retinal detachment Neg Hx    Strabismus Neg Hx    Retinitis pigmentosa Neg Hx    Past Medical History:  Diagnosis Date   Arthritis    Cataract    Diabetes mellitus type 2 with complications ([place])    diagnosed probably 2012 per patient   Diabetic retinopathy ([place])    Hypertension    Rheumatoid arthritis ([place])    Current Outpatient Medications  Medication Sig Dispense Refill   ACCU-CHEK GUIDE test strip See admin instructions.     Accu-Chek Softclix Lancets lancets See admin instructions.     acetaminophen (TYLENOL) 325 MG tablet Take  1-2 tablets (325-650 mg total) by mouth every 6 (six) hours as needed for mild pain (pain score 1-3 or temp > 100.5). 20 tablet 0   aspirin EC 81 MG tablet Take 81 mg by mouth every Monday, Wednesday, and Friday.     atorvastatin (LIPITOR) 40 MG tablet Take 1 tablet (40 mg total) by mouth daily. 90 tablet 3   bisacodyl (DULCOLAX) 5 MG EC tablet Take 1 tablet (5 mg total) by mouth daily as needed for moderate constipation. 30 tablet 0  Blood Glucose Monitoring Suppl (ACCU-CHEK GUIDE ME) w/Device KIT See admin instructions.     dapagliflozin propanediol (FARXIGA) 10 MG TABS tablet Take 1 tablet (10 mg total) by mouth daily before breakfast. 90 tablet 3   docusate sodium (COLACE) 100 MG capsule Take 100 mg by mouth daily as needed for mild constipation.     ferrous gluconate (FERGON) 324 MG tablet Take 1 tablet (324 mg total) by mouth 2 (two) times daily with a meal. 180 tablet 1   furosemide (LASIX) 40 MG tablet Take 0.5 tablets (20 mg total) by mouth daily. 15 tablet 11   guaiFENesin (MUCINEX) 600 MG 12 hr tablet Take 1 tablet (600 mg total) by mouth 2 (two) times daily. 10 tablet 0   hydrALAZINE (APRESOLINE) 50 MG tablet Take 1 tablet (50 mg total) by mouth every 8 (eight) hours. 90 tablet 6   HYDROcodone-acetaminophen (NORCO/VICODIN) 5-325 MG tablet Take 1-2 tablets by mouth every 6 (six) hours as needed for severe pain. 42 tablet 0   hydroxychloroquine (PLAQUENIL) 200 MG tablet Take 1 tablet (200 mg total) by mouth daily. 30 tablet 2   isosorbide mononitrate (IMDUR) 60 MG 24 hr tablet Take 1 tablet (60 mg total) by mouth daily. 30 tablet 11   metoprolol succinate (TOPROL-XL) 50 MG 24 hr tablet TAKE 1 TABLET(50 MG) BY MOUTH DAILY WITH OR IMMEDIATELY FOLLOWING A MEAL 30 tablet 5   polyethylene glycol (MIRALAX / GLYCOLAX) 17 g packet Take 17 g by mouth daily as needed for mild constipation. 14 each 0   potassium chloride SA (KLOR-CON) 20 MEQ tablet Take 2 tablets (40 mEq total) by mouth 2 (two) times  daily. 120 tablet 3   sacubitril-valsartan (ENTRESTO) 97-103 MG Take 1 tablet by mouth 2 (two) times daily. 60 tablet 6   spironolactone (ALDACTONE) 25 MG tablet Take 1 tablet (25 mg total) by mouth daily. 90 tablet 1   TRADJENTA 5 MG TABS tablet TAKE 1 TABLET(5 MG) BY MOUTH DAILY 90 tablet 1   Vitamin D, Ergocalciferol, (DRISDOL) 1.25 MG (50000 UNIT) CAPS capsule Take 50,000 Units by mouth every Friday.     No current facility-administered medications for this encounter.   Vitals:   12/01/21 1355  BP: 130/60  Pulse: 63  SpO2: 100%  Weight: 59.8 kg      Wt Readings from Last 3 Encounters:  12/01/21 59.8 kg  11/17/21 63 kg  10/18/21 61.2 kg   PHYSICAL EXAM: General:  Well appearing. No resp difficulty HEENT: normal Neck: supple. no JVD. Carotids 2+ bilat; no bruits. No lymphadenopathy or thryomegaly appreciated. Cor: PMI nondisplaced. Regular rate & rhythm. No rubs, gallops or murmurs. Lungs: clear Abdomen: soft, nontender, nondistended. No hepatosplenomegaly. No bruits or masses. Good bowel sounds. Extremities: no cyanosis, clubbing, rash, no LE edema, multiple nodules on hands Neuro: alert & orientedx3, cranial nerves grossly intact. moves all 4 extremities w/o difficulty. Affect pleasant    ASSESSMENT & PLAN:  1. 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  - Echo (11/22): EF 55%, Mod-Severe MR, mild TR - TEE (11/22): EF 55%, RV okay, 2+ central MR,  - NYHA II. Volume stable.  - Continue spiro 25 mg daily. - Continue Toprol XL 50 mg daily. - Continue hydralazine 50 mg tid + Imdur 60  mg daily. - Stop Wilder Glade d/t frequent yeast infections - Continue Entresto to 97/182m twice a day.  - Increase furosemide to 40 mg daily since stopping SGLT2i - Check BMET today    2. CAD  - R/LHC (2/22) 3v CAD with totally occluded RCA with L -> R collaterals, 80% lesion in mLCX, mild non-obs in LAD - No chest pain.  - Continue aspirin 81 mg daily.   - Continue atorvastatin 40 mg daily.   3. 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 T2DM: -a1c 04/2021 7.0 -On tradjenta + Farxiga -Followed by PCP -Stop FWilder Gladeas above  5. CKD3a: - Baseline Scr~1.2-1.5 - Recent creatinine 0.95 in 11/22  6. Severe Sleep Apnea . - AHI 60/hr. Suspect severe OSA contributing to HTN  - unable to tolerate CPAP. Would she be candidate for Inspire device?  - Followed by Dr TRadford Pax Recommended she f/u to discuss options  7. HTN -BP at goal today  8. Tobacco Use -Discussed smoking cessation again today -Planning to discuss smoking cessation aids with her PCP  Follow up 6 months with echo   FGreat[facility] LINDSAY N, PA-C 12/01/21  Patient seen and examined with the above-signed Advanced Practice Provider and/or Housestaff. I personally reviewed laboratory data, imaging studies and relevant notes. I independently examined the patient and formulated the important aspects of the plan. I have edited the note to reflect any of my changes or salient points. I have personally discussed the plan with the patient and/or family.  Recent admitted for septic arthritis related to her gout. Remains stable from HF perspective. Taking all meds. Having recurrent vaginal yeast infections on SGLT2i,. EF 55% on recent TEE with moderate MR. Volume status ok. NYHA II. No angina. Continues to smoke.  General:  Thin. No resp difficulty HEENT: normal Neck: supple. no JVD. Carotids 2+ bilat; no bruits. No lymphadenopathy or thryomegaly appreciated. Cor: PMI nondisplaced. Regular rate & rhythm. No rubs, gallops or murmurs. Lungs: clear decreased throughout Abdomen: soft,  nontender, nondistended. No hepatosplenomegaly. No bruits or masses. Good bowel sounds. Extremities: no cyanosis, clubbing, rash, edema. Diffuse severe gouty nodules Neuro: alert & orientedx3, cranial nerves grossly intact. moves all 4 extremities w/o difficulty. Affect pleasant  Stable from HF and CAD perspective. Having recurrent yeast infections. Will stop FIran Resume lasix. Smoking cessation encouraged. Needs to f/u with Dr. TRadford Paxto discuss options for severe OSA.   DGlori Bickers MD  9:08 PM

## 2021-12-11 ENCOUNTER — Other Ambulatory Visit: Payer: Self-pay | Admitting: Internal Medicine

## 2021-12-11 DIAGNOSIS — M069 Rheumatoid arthritis, unspecified: Secondary | ICD-10-CM

## 2021-12-11 NOTE — Telephone Encounter (Signed)
Next Visit: 01/01/2022  Last Visit: 07/03/2021  Labs: 07/14/2021 RDW 16.0, Potassium 3.4, Glucose 167, Creat. 1.05, Calcium 8.5, Albumin 2.8, AST 14, GFR 59  Eye exam: not on file   Current Dose per office note 07/03/2021: hydroxychloroquine 200 mg daily  XK:[id number] arthritis involving both hands, unspecified whether rheumatoid factor present   Last Fill:  05/08/2021  Okay to refill Plaquenil?

## 2021-12-20 ENCOUNTER — Encounter (INDEPENDENT_AMBULATORY_CARE_PROVIDER_SITE_OTHER): Payer: Medicare Other | Admitting: Ophthalmology

## 2021-12-25 ENCOUNTER — Ambulatory Visit (INDEPENDENT_AMBULATORY_CARE_PROVIDER_SITE_OTHER): Payer: Medicare Other | Admitting: Ophthalmology

## 2021-12-25 ENCOUNTER — Encounter (INDEPENDENT_AMBULATORY_CARE_PROVIDER_SITE_OTHER): Payer: Self-pay | Admitting: Ophthalmology

## 2021-12-25 ENCOUNTER — Encounter (INDEPENDENT_AMBULATORY_CARE_PROVIDER_SITE_OTHER): Payer: Medicare Other | Admitting: Ophthalmology

## 2021-12-25 ENCOUNTER — Other Ambulatory Visit: Payer: Self-pay

## 2021-12-25 DIAGNOSIS — G4733 Obstructive sleep apnea (adult) (pediatric): Secondary | ICD-10-CM | POA: Diagnosis not present

## 2021-12-25 DIAGNOSIS — E113591 Type 2 diabetes mellitus with proliferative diabetic retinopathy without macular edema, right eye: Secondary | ICD-10-CM | POA: Diagnosis not present

## 2021-12-25 DIAGNOSIS — E113592 Type 2 diabetes mellitus with proliferative diabetic retinopathy without macular edema, left eye: Secondary | ICD-10-CM | POA: Diagnosis not present

## 2021-12-25 DIAGNOSIS — H2513 Age-related nuclear cataract, bilateral: Secondary | ICD-10-CM | POA: Diagnosis not present

## 2021-12-25 NOTE — Assessment & Plan Note (Signed)
Active but much less active than recent post recent PRP.  Would likely need antivegF but will hold off on this until after cataract surgery is completed so as to be ready to proceed to vitrectomy and removal of massive NVD NVE if it is necessary.  Currently there is no active CSME OS

## 2021-12-25 NOTE — Assessment & Plan Note (Signed)
Active but less active post recent PRP OD we will continue to monitor for further resolution

## 2021-12-25 NOTE — Progress Notes (Signed)
12/25/2021     CHIEF COMPLAINT Patient presents for  Chief Complaint  Patient presents with   Retina Follow Up      HISTORY OF PRESENT ILLNESS: Maria Foster is a 66 y.o. female who presents to the clinic today for:   HPI     Retina Follow Up           Diagnosis: Diabetic Retinopathy   Laterality: both eyes   Onset: 5 weeks ago   Severity: mild   Duration: 5 weeks   Course: stable         Comments   5 week fu dilate OU and OCT and FP  Pt states VA OU stable since last visit. Pt denies FOL, floaters, or ocular pain OU.  Pt states, "I do feel like my eyes are running water more and they feel like they have something in the corners of them."        Last edited by Kendra Opitz, COA on 12/25/2021  1:23 PM.      Referring physician: Carlena Hurl, PA-C [place],  [place] [address]  HISTORICAL INFORMATION:   Selected notes from the MEDICAL RECORD NUMBER    Lab Results  Component Value Date   HGBA1C 7.0 (H) 05/04/2021     CURRENT MEDICATIONS: No current outpatient medications on file. (Ophthalmic Drugs)   No current facility-administered medications for this visit. (Ophthalmic Drugs)   Current Outpatient Medications (Other)  Medication Sig   ACCU-CHEK GUIDE test strip See admin instructions.   Accu-Chek Softclix Lancets lancets See admin instructions.   acetaminophen (TYLENOL) 325 MG tablet Take 1-2 tablets (325-650 mg total) by mouth every 6 (six) hours as needed for mild pain (pain score 1-3 or temp > 100.5).   aspirin EC 81 MG tablet Take 81 mg by mouth every Monday, Wednesday, and Friday.   atorvastatin (LIPITOR) 40 MG tablet Take 1 tablet (40 mg total) by mouth daily.   bisacodyl (DULCOLAX) 5 MG EC tablet Take 1 tablet (5 mg total) by mouth daily as needed for moderate constipation.   Blood Glucose Monitoring Suppl (ACCU-CHEK GUIDE ME) w/Device KIT See admin instructions.   docusate sodium (COLACE) 100 MG capsule Take 100 mg by  mouth daily as needed for mild constipation.   ferrous gluconate (FERGON) 324 MG tablet Take 1 tablet (324 mg total) by mouth 2 (two) times daily with a meal.   furosemide (LASIX) 40 MG tablet Take 1 tablet (40 mg total) by mouth daily.   guaiFENesin (MUCINEX) 600 MG 12 hr tablet Take 1 tablet (600 mg total) by mouth 2 (two) times daily.   hydrALAZINE (APRESOLINE) 50 MG tablet Take 1 tablet (50 mg total) by mouth every 8 (eight) hours.   HYDROcodone-acetaminophen (NORCO/VICODIN) 5-325 MG tablet Take 1-2 tablets by mouth every 6 (six) hours as needed for severe pain.   hydroxychloroquine (PLAQUENIL) 200 MG tablet TAKE 1 TABLET(200 MG) BY MOUTH DAILY   isosorbide mononitrate (IMDUR) 60 MG 24 hr tablet Take 1 tablet (60 mg total) by mouth daily.   metoprolol succinate (TOPROL-XL) 50 MG 24 hr tablet TAKE 1 TABLET(50 MG) BY MOUTH DAILY WITH OR IMMEDIATELY FOLLOWING A MEAL   polyethylene glycol (MIRALAX / GLYCOLAX) 17 g packet Take 17 g by mouth daily as needed for mild constipation.   potassium chloride SA (KLOR-CON) 20 MEQ tablet Take 2 tablets (40 mEq total) by mouth 2 (two) times daily.   sacubitril-valsartan (ENTRESTO) 97-103 MG Take 1 tablet  by mouth 2 (two) times daily.   spironolactone (ALDACTONE) 25 MG tablet Take 1 tablet (25 mg total) by mouth daily.   TRADJENTA 5 MG TABS tablet TAKE 1 TABLET(5 MG) BY MOUTH DAILY   Vitamin D, Ergocalciferol, (DRISDOL) 1.25 MG (50000 UNIT) CAPS capsule Take 50,000 Units by mouth every Friday.   No current facility-administered medications for this visit. (Other)      REVIEW OF SYSTEMS:    ALLERGIES Allergies  Allergen Reactions   Metformin And Related Other (See Comments)    Kidney injury, GI upset, diarrhea    PAST MEDICAL HISTORY Past Medical History:  Diagnosis Date   Arthritis    Cataract    Diabetes mellitus type 2 with complications ([place])    diagnosed probably 2012 per patient   Diabetic retinopathy ([place])    Hypertension     Rheumatoid arthritis ([place])    Past Surgical History:  Procedure Laterality Date   INCISION AND DRAINAGE ABSCESS N/A 07/11/2021   Procedure: INCISION AND DRAINAGE RIGHT ELBOW;  Surgeon: Paralee Cancel, MD;  Location: [facility];  Service: Orthopedics;  Laterality: N/A;   PRP Bilateral OD - 07/23/2017 OS - 08/06/2017   RIGHT/LEFT HEART CATH AND CORONARY ANGIOGRAPHY N/A 01/05/2021   Procedure: RIGHT/LEFT HEART CATH AND CORONARY ANGIOGRAPHY;  Surgeon: Jolaine Artist, MD;  Location: [facility];  Service: Cardiovascular;  Laterality: N/A;   TEE WITHOUT CARDIOVERSION N/A 10/09/2021   Procedure: TRANSESOPHAGEAL ECHOCARDIOGRAM (TEE);  Surgeon: Jolaine Artist, MD;  [facility];  Service: Cardiovascular;  Laterality: N/A;    FAMILY HISTORY Family History  Problem Relation Age of Onset   Diabetes Mother    Hypertension Mother    Heart Problems Sister    Amblyopia Neg Hx    Blindness Neg Hx    Cataracts Neg Hx    Glaucoma Neg Hx    Macular degeneration Neg Hx    Retinal detachment Neg Hx    Strabismus Neg Hx    Retinitis pigmentosa Neg Hx     SOCIAL HISTORY Social History   Tobacco Use   Smoking status: Former    Packs/day: 0.50    Years: 15.00    Pack years: 7.50    Types: Cigarettes    Quit date: 11/19/2020    Years since quitting: 1.0   Smokeless tobacco: Never  Vaping Use   Vaping Use: Never used  Substance Use Topics   Alcohol use: Not Currently   Drug use: No         OPHTHALMIC EXAM:  Base Eye Exam     Visual Acuity (ETDRS)       Right Left   Dist [place] 20/30 -1 20/40   Dist ph [place] 20/25 20/25 -1    Correction: Glasses         Tonometry (Tonopen, 1:27 PM)       Right Left   Pressure 17 17         Pupils       Pupils Dark Light Shape React APD   Right PERRL 3 3 Round Minimal None   Left PERRL 3 3 Round Minimal None         Visual Fields (Counting fingers)       Left Right    Full Full         Extraocular Movement        Right Left    Full, Ortho Full, Ortho         Neuro/Psych  Oriented x3: Yes   Mood/Affect: Normal         Dilation     Both eyes: 1.0% Mydriacyl, 2.5% Phenylephrine @ 1:26 PM           Slit Lamp and Fundus Exam     External Exam       Right Left   External Normal Normal         Slit Lamp Exam       Right Left   Lids/Lashes Normal Normal   Conjunctiva/Sclera Temporal and Nasal Pinguecula; racial melanosis Temporal and Nasal Pinguecula; racial melanosis   Cornea Clear Clear   Anterior Chamber Deep and quiet Deep and quiet   Iris dilated to 58m; no NVI dilated to 6.530m no NVI   Lens 2+ Nuclear sclerosis, 3+ Cortical cataract 2+ Nuclear sclerosis, 3+ Cortical cataract   Anterior Vitreous Vitreous syneresis Vitreous syneresis; small clumps of VH inferiorlly; VH settling inferiorly          Fundus Exam       Right Left   Posterior Vitreous No PVD, small scattered preretinal hemorrhages inferiorly No PVD, small scattered preretinal hemorrhages inferiorly   Disc NVD extension along the inferotemporal arcade appears fibrotic reactive Massive NVD, greater than 12 disc areas in size   C/D Ratio 0.6 0.5   Macula DBH; early NVE temporally; with fibrous changes large region of nontreated retina in this area cotton wool spots sup; macular neovascularization temporal aspect of the macula   Vessels NVE along inferiortemp  arcade, superonasal to disc NVE at 1030, peripherally at equator   [facility]; scattered dot and blot hemorrhages 360; scattered PRP 360 w/ room for fill-in; pre-retinal heme above disc;  DBH; attached; scattered dot and blot hemorrhages 360; PRP laser, room for fill in temporally and inferiorly;             IMAGING AND PROCEDURES  Imaging and Procedures for 12/25/21  OCT, Retina - OU - Both Eyes       Right Eye Quality was good. Scan locations included subfoveal. Central Foveal Thickness: 255. Progression has been stable.  Findings include no IRF, no SRF, abnormal foveal contour, epiretinal membrane, vitreomacular adhesion , vitreous traction (Mild inf edema).   Left Eye Quality was good. Scan locations included subfoveal. Central Foveal Thickness: 277. Progression has been stable. Findings include no SRF, no IRF, abnormal foveal contour, epiretinal membrane, vitreous traction, vitreomacular adhesion .   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  We will continue to monitor and observe. OD with epiretinal membrane minor as well, with vitreomacular Clinical management:  Adherence but no traction on the macula no action will CSME, yet inferiorly significant fibrovascular disease from DM   See below  Abbreviations: NFP - Normal foveal profile. CME - cystoid macular edema. PED - pigment epithelial detachment. IRF - intraretinal fluid. SRF - subretinal fluid. EZ - ellipsoid zone. ERM - epiretinal membrane. ORA - outer retinal atrophy. ORT - outer retinal tubulation. SRHM - subretinal hyper-reflective material       Color Fundus Photography Optos - OU - Both Eyes       Right Eye Progression has no prior data. Disc findings include neovascularization. Macula : microaneurysms, epiretinal membrane. Vessels : Neovascularization. Periphery : neovascularization.   Left Eye Progression has no prior data. Disc findings include neovascularization. Macula : microaneurysms, epiretinal membrane. Vessels : Neovascularization. Periphery : neovascularization.   Notes Bilateral  active PDR with good to moderate scatter PRP OU.,  Less active overall yet small increase in preretinal hemorrhage left eye  Left eye with rather massive neovascularization of the nerve disc and posteriorly in the macular region.  Left eye may need ultimate vitrectomy.  Will recommend evaluation for possible cataract surgery in each eye in the near future so as to clear the visual  axis             ASSESSMENT/PLAN:  OSA (obstructive sleep apnea) I asked patient to consider commencement of his CPAP if instructed by her other doctors and positive testing for sleep apnea so as to help controlling diabetic eye disease and its complications  Cataract, nuclear sclerotic, both eyes OU with progression, now with PDR active but still less so post recent additional PRP, will recommend cataract surgery evaluation  We will schedule appoint with Dr. 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  Would likely need antivegF but will hold off on this until after cataract surgery is completed so as to be ready to proceed to vitrectomy and removal of massive NVD NVE if it is necessary.  Currently there is no active CSME OS     ICD-10-CM   1. 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 2 diabetes mellitus, unspecified proliferative retinopathy type (HCC)  W23.7628 OCT, Retina - OU - Both Eyes    Color Fundus Photography Optos - OU - Both Eyes    3. OSA (obstructive sleep apnea)  G47.33     4. Cataract, nuclear sclerotic, both eyes  H25.13       1.  Less active PDR OU yet still active.  2.  Significant cataract OU with impact on acuity as patient has nighttime and dark  rainy day visualizing problems.  Moreover the cataract poses a medical hindrance to evaluate the PDR posteriorly in the retina.  May in fact need vitrectomy surgery in the future and thus routine cataract surgery with with standard monofocal lens would be appropriate  3.  Cataract surgery, saltation with Dr. Rutherford Guys in the near future  Ophthalmic Meds Ordered this visit:  No orders of the defined types were placed in this encounter.      Return in about 5 weeks (around 01/29/2022) for DILATE OU, OCT, COLOR FP,, possible Avastin OD or OS.  There are no Patient Instructions on file for this visit.   Explained the diagnoses, plan, and follow up with the patient and they expressed understanding.  Patient expressed understanding of the importance of proper follow up care.   Clent Demark Celie Desrochers M.D. Diseases & Surgery of the Retina and Vitreous Retina & Diabetic [place] 12/25/21     Abbreviations: M myopia (nearsighted); A astigmatism; H hyperopia (farsighted); P presbyopia; Mrx spectacle prescription;  CTL contact lenses; OD  right eye; OS left eye; OU both eyes  XT exotropia; ET esotropia; PEK punctate epithelial keratitis; PEE punctate epithelial erosions; DES dry eye syndrome; MGD meibomian gland dysfunction; ATs artificial tears; PFAT's preservative free artificial tears; [place] nuclear sclerotic cataract; PSC posterior subcapsular cataract; ERM epi-retinal membrane; PVD posterior vitreous detachment; RD retinal detachment; DM diabetes mellitus; DR diabetic retinopathy; NPDR non-proliferative diabetic retinopathy; PDR proliferative diabetic retinopathy; CSME clinically significant macular edema; DME diabetic macular edema; dbh dot blot hemorrhages; CWS cotton wool spot; POAG primary open angle glaucoma; C/D cup-to-disc ratio; HVF humphrey visual field; GVF goldmann visual field; OCT optical coherence tomography; IOP intraocular pressure; BRVO Branch retinal vein occlusion; CRVO central retinal  vein occlusion; CRAO central retinal artery occlusion; BRAO branch retinal artery occlusion; RT retinal tear; SB scleral buckle; PPV pars plana vitrectomy; VH Vitreous hemorrhage; PRP panretinal laser photocoagulation; IVK intravitreal kenalog; VMT vitreomacular traction; MH Macular hole;  NVD neovascularization of the disc; NVE neovascularization elsewhere; AREDS age related eye disease study; ARMD age related macular degeneration; POAG primary open angle glaucoma; EBMD epithelial/anterior basement membrane dystrophy; ACIOL anterior chamber intraocular lens; IOL intraocular lens; PCIOL posterior chamber intraocular lens; Phaco/IOL phacoemulsification with intraocular lens placement; [place] photorefractive keratectomy; LASIK laser assisted in situ keratomileusis; HTN hypertension; DM diabetes mellitus; COPD chronic obstructive pulmonary disease

## 2021-12-25 NOTE — Assessment & Plan Note (Signed)
OU with progression, now with PDR active but still less so post recent additional PRP, will recommend cataract surgery evaluation  We will schedule appoint with Dr. Jethro Bolus in the coming weeks for prompt medical reasons to have the cataract surgery performed so as to allow for vitrectomy later on should be necessary

## 2021-12-25 NOTE — Assessment & Plan Note (Signed)
I asked patient to consider commencement of his CPAP if instructed by her other doctors and positive testing for sleep apnea so as to help controlling diabetic eye disease and its complications

## 2021-12-26 ENCOUNTER — Encounter: Payer: Self-pay | Admitting: Medical

## 2021-12-26 ENCOUNTER — Ambulatory Visit (INDEPENDENT_AMBULATORY_CARE_PROVIDER_SITE_OTHER): Payer: Medicare Other | Admitting: Medical

## 2021-12-26 VITALS — BP 134/88 | HR 62 | Temp 98.5°F | Wt 136.0 lb

## 2021-12-26 DIAGNOSIS — Z720 Tobacco use: Secondary | ICD-10-CM

## 2021-12-26 DIAGNOSIS — E113592 Type 2 diabetes mellitus with proliferative diabetic retinopathy without macular edema, left eye: Secondary | ICD-10-CM

## 2021-12-26 DIAGNOSIS — M05741 Rheumatoid arthritis with rheumatoid factor of right hand without organ or systems involvement: Secondary | ICD-10-CM

## 2021-12-26 DIAGNOSIS — N1831 Chronic kidney disease, stage 3a: Secondary | ICD-10-CM

## 2021-12-26 DIAGNOSIS — I10 Essential (primary) hypertension: Secondary | ICD-10-CM

## 2021-12-26 DIAGNOSIS — E113591 Type 2 diabetes mellitus with proliferative diabetic retinopathy without macular edema, right eye: Secondary | ICD-10-CM

## 2021-12-26 DIAGNOSIS — M05742 Rheumatoid arthritis with rheumatoid factor of left hand without organ or systems involvement: Secondary | ICD-10-CM

## 2021-12-26 DIAGNOSIS — G4733 Obstructive sleep apnea (adult) (pediatric): Secondary | ICD-10-CM

## 2021-12-26 DIAGNOSIS — I5040 Unspecified combined systolic (congestive) and diastolic (congestive) heart failure: Secondary | ICD-10-CM

## 2021-12-26 DIAGNOSIS — E11319 Type 2 diabetes mellitus with unspecified diabetic retinopathy without macular edema: Secondary | ICD-10-CM | POA: Diagnosis not present

## 2021-12-26 LAB — POCT GLYCOSYLATED HEMOGLOBIN (HGB A1C): Hemoglobin A1C: 6.6 % — AB (ref 4.0–5.6)

## 2021-12-26 NOTE — Progress Notes (Signed)
Subjective: Chief Complaint  Patient presents with   Diabetes    Diabetes check tingling in both feet with little sharp pain, burning pain, BS has been good 133 and 140.    Here for med check  Diabetes-Farxiga was discontinued recently due to yeast infections.  She is just taking Tradjenta currently.  She has not tolerated metformin prior.  No current concerns.  No recent yeast infection concern  Hx/o sleep apnea - no prior CPAP.  She is reluctant to do an oral airway piece, does not want to do the inspire procedure or CPAP.  She recently saw her eye doctor.  She is being referred to Dr. Gershon Crane for cataract surgery.  She saw cardiology relatively recently for heart failure follow-up  Last visit we referred for colon cancer screening.  She apparently has that office money so has not been able to schedule yet  Past Medical History:  Diagnosis Date   Arthritis    Cataract    Diabetes mellitus type 2 with complications ([place])    diagnosed probably 2012 per patient   Diabetic retinopathy ([place])    Hypertension    Rheumatoid arthritis ([place])    Current Outpatient Medications on File Prior to Visit  Medication Sig Dispense Refill   ACCU-CHEK GUIDE test strip See admin instructions.     Accu-Chek Softclix Lancets lancets See admin instructions.     acetaminophen (TYLENOL) 325 MG tablet Take 1-2 tablets (325-650 mg total) by mouth every 6 (six) hours as needed for mild pain (pain score 1-3 or temp > 100.5). 20 tablet 0   aspirin EC 81 MG tablet Take 81 mg by mouth every Monday, Wednesday, and Friday.     atorvastatin (LIPITOR) 40 MG tablet Take 1 tablet (40 mg total) by mouth daily. 90 tablet 3   Blood Glucose Monitoring Suppl (ACCU-CHEK GUIDE ME) w/Device KIT See admin instructions.     docusate sodium (COLACE) 100 MG capsule Take 100 mg by mouth daily as needed for mild constipation.     ferrous gluconate (FERGON) 324 MG tablet Take 1 tablet (324 mg total) by mouth 2 (two) times daily  with a meal. 180 tablet 1   furosemide (LASIX) 40 MG tablet Take 1 tablet (40 mg total) by mouth daily. 30 tablet 11   guaiFENesin (MUCINEX) 600 MG 12 hr tablet Take 1 tablet (600 mg total) by mouth 2 (two) times daily. 10 tablet 0   hydrALAZINE (APRESOLINE) 50 MG tablet Take 1 tablet (50 mg total) by mouth every 8 (eight) hours. 90 tablet 6   HYDROcodone-acetaminophen (NORCO/VICODIN) 5-325 MG tablet Take 1-2 tablets by mouth every 6 (six) hours as needed for severe pain. 42 tablet 0   hydroxychloroquine (PLAQUENIL) 200 MG tablet TAKE 1 TABLET(200 MG) BY MOUTH DAILY 30 tablet 2   isosorbide mononitrate (IMDUR) 60 MG 24 hr tablet Take 1 tablet (60 mg total) by mouth daily. 30 tablet 11   metoprolol succinate (TOPROL-XL) 50 MG 24 hr tablet TAKE 1 TABLET(50 MG) BY MOUTH DAILY WITH OR IMMEDIATELY FOLLOWING A MEAL 30 tablet 5   polyethylene glycol (MIRALAX / GLYCOLAX) 17 g packet Take 17 g by mouth daily as needed for mild constipation. 14 each 0   potassium chloride SA (KLOR-CON) 20 MEQ tablet Take 2 tablets (40 mEq total) by mouth 2 (two) times daily. 120 tablet 3   sacubitril-valsartan (ENTRESTO) 97-103 MG Take 1 tablet by mouth 2 (two) times daily. 60 tablet 6   spironolactone (ALDACTONE) 25 MG tablet  Take 1 tablet (25 mg total) by mouth daily. 90 tablet 1   TRADJENTA 5 MG TABS tablet TAKE 1 TABLET(5 MG) BY MOUTH DAILY 90 tablet 1   Vitamin D, Ergocalciferol, (DRISDOL) 1.25 MG (50000 UNIT) CAPS capsule Take 50,000 Units by mouth every Friday.     bisacodyl (DULCOLAX) 5 MG EC tablet Take 1 tablet (5 mg total) by mouth daily as needed for moderate constipation. (Patient not taking: Reported on 12/26/2021) 30 tablet 0   No current facility-administered medications on file prior to visit.   ROS as in subjective    Objective: BP 134/88    Pulse 62    Temp 98.5 F (36.9 C)    Wt 136 lb (61.7 kg)    BMI 24.09 kg/m   General: Well-developed well-nourished no acute distress No obvious edema on  exam Psych: Pleasant, answers questions appropriate, Otherwise not examined     Assessment: Encounter Diagnoses  Name Primary?   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  We also spent a lot of time discussing diabetes and treatment options  Diabetes- farxiga recently stopped due to yeast infections.  Continue Tradjenta 68m daily for now.   Advised she monitor sugars with goal <130 fasting.  We discussed possibly need to add a different class of medication going forward. She was reluctant do add anything, and seemed very reluctant for insulin.   She hasn't tolerated metformin.  Hemoglobin A1c today 6.6%  Sleep apnea-I reviewed back over the sleep study she had last year 2022 which was significant.  We discussed treatment options.  She is reluctant to do any treatment option.  She deftly does not want the inspire procedure.  After a long discussion she seems willing to at least talk to home health about a CPAP trial.  We will refer  Rheumatoid arthritis-managed by rheumatology  Diabetic retinopathy - I reviewed Dr. RDahlia Bailiffnotes from  12/25/21.   She has proliferative diabetic retinopathy, cataract surgery is planned soon for cataracts, has also been scheduled with Dr. MRutherford Guysregarding the cataracts.  Similar to cardiology, retina specialist recommended she get on CPAP which could also help with controlling her eye disease.  Combined systolic and diastolic heart failure, biventricular CHF  -I reviewed over her cardiology notes from December 01, 2021, Dr. BHaroldine Laws She has longstanding poorly controlled hypertension.  Decreased ejection fraction.  She was continued on spironolactone 25 mg daily, Toprol-XL 50 mg daily, hydralazine 50 mg 3 times a day plus Imdur 60 mg daily, Entresto 97/23 mg twice daily.  Furosemide was increased to 40 mg daily since they stopped FIrandue to yeast infections.  CAD-she was continued on aspirin and  statin.  They had discussed sleep apnea as well.    Tobacco use-she is still smoking about 1/3 pack/day.  Counseled on stopping smoking completely  She is due for cancer screening.  We referred for colon cancer screening last visit but she still owes that practice money.  Thus, she has not been able to schedule yet  Advise she return for Pap smear in the near future  Hypertension-continue current medications  Hyperlipidemia-continue current medications   Kemia was seen today for diabetes.  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

## 2021-12-27 ENCOUNTER — Telehealth: Payer: Self-pay | Admitting: Medical

## 2021-12-27 DIAGNOSIS — G4733 Obstructive sleep apnea (adult) (pediatric): Secondary | ICD-10-CM

## 2021-12-27 NOTE — Telephone Encounter (Signed)
Refer to supplier for CPAP.  Make sure they are aware that she is reluctant to start therapy but she absolutely needs to start some type of CPAP device per me, cardiology, and eye doctor.

## 2021-12-27 NOTE — Telephone Encounter (Signed)
I found sleep study- and I have put in order and sent to [facility] for CPAP machine

## 2021-12-27 NOTE — Telephone Encounter (Signed)
When was her last sleep study?

## 2021-12-31 NOTE — Progress Notes (Signed)
Office Visit Note  Patient: Maria Foster             Date of Birth: 02/11/1956           MRN: [id number]             PCP: Carlena Hurl, PA-C Referring: Carlena Hurl, PA-C Visit Date: 01/01/2022   Subjective:  Follow-up (Doing great)   History of Present Illness: Maria Foster is a 66 y.o. female here for follow up for seropositive RA on HCQ 200 mg daily. After our last visit with right elbow injection she developed worsening pain and then purulent drainage found to be MSSA infection of that right olecranon bursa and went to the hospital for surgical debridement of the wound. Wound culture was obtained also surgical report of rheumatoid nodule at the site. She took a course of antibiotics with PICC line completed without incident. She resumed hydroxychloroquine after this and she feels her joint pain is very well controlled at this time. She notices some increase in stiffness and pain with cold and changing weather.  Previous HPI 07/03/21 Maria Foster is a 66 y.o. female here for follow up for seropositive RA and chronic tophaceous gout. Recommended starting hydroxychloroquine treatment 200 mg PO daily for suspected RA as active disease and normal uric levels. She feels her symptoms are improved since starting this, particularly states pain with weather and temperature changes are decreased. She has right elbow pain more than elsewhere says this hurts mostly due to pressure on the site when lying in bed or certain seated positions.   Previous HPI: 03/29/21 Maria Foster is a 66 y.o. female referred here for evaluation of rheumatoid arthritis.  She has a longstanding history of bilateral hand and knee pain that has been going on for years with some progressive worsening and deformity over time.  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  She also has chronic knee pain and stiffness bilaterally without any large nodular changes.  She does not have much joint pain most days however experiences some episodically and also feels that her hands and knees hurt worse with cold and rainy weather.  She describes a history of rheumatoid arthritis but has never been on treatment plans for this.  She states she has never had any episode of gout involving the toes or feet but has been told by her cardiologist this is a problem that she has.   She does not recall ever undergoing bone density screening for osteoporosis. She has a long smoking history but in recent months is either not smoking or very reduced usage most days.   Review of Systems  Constitutional:  Negative for fatigue.  HENT:  Negative for mouth dryness.   Eyes:  Negative for dryness.  Respiratory:  Negative for shortness of breath.   Cardiovascular:  Negative for swelling in legs/feet.  Gastrointestinal:  Negative for constipation.  Endocrine: Negative for excessive thirst.  Genitourinary:  Negative for difficulty urinating.  Musculoskeletal:  Positive for gait problem.  Skin:  Negative for rash.  Allergic/Immunologic: Negative for susceptible to infections.  Neurological:  Positive for numbness.  Hematological:  Negative for bruising/bleeding tendency.  Psychiatric/Behavioral:  Positive for sleep disturbance.    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 type 2 diabetes mellitus ([place]) 11/16/2021   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 2 diabetes mellitus ([place]) 11/16/2021   Cataract, nuclear sclerotic, both eyes 11/16/2021   OSA (obstructive sleep apnea) 08/25/2021   Iron deficiency 08/25/2021   Screening for cancer 08/25/2021   Influenza vaccination declined 08/25/2021   Prolonged  QT interval 07/11/2021   Stage 3a chronic kidney disease ([place]) 07/11/2021   Septic olecranon bursitis of right elbow 07/11/2021   Essential hypertension, benign 05/04/2021   Hyperlipidemia associated with type 2 diabetes mellitus ([place]) 05/04/2021   Enlarged and hypertrophic nails 05/04/2021   Encounter for screening mammogram for malignant neoplasm of breast 05/04/2021   Need for pneumococcal vaccination 05/04/2021   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[place]) 05/04/2021   Stable angina ([place]) 05/04/2021   Vaccine counseling 05/04/2021   Tobacco use 05/04/2021   High risk medication use 03/29/2021   Screen for colon cancer 01/27/2021   Tophi 01/11/2021   Rheumatoid arthritis involving both hands ([place]) 01/11/2021   Elevated troponin    Diabetes ([place]) 12/19/2020   Constipation 12/19/2020    Past Medical History:  Diagnosis Date   Arthritis    Cataract    Diabetes mellitus type 2 with complications ([place])    diagnosed probably 2012 per patient   Diabetic retinopathy ([place])    Hypertension    Rheumatoid arthritis ([place])     Family History  Problem Relation Age of Onset   Diabetes Mother    Hypertension Mother    Heart Problems Sister    Amblyopia Neg Hx    Blindness Neg Hx    Cataracts Neg Hx    Glaucoma Neg Hx    Macular degeneration Neg Hx    Retinal detachment Neg Hx    Strabismus Neg Hx    Retinitis pigmentosa Neg Hx    Past Surgical History:  Procedure Laterality Date   INCISION AND DRAINAGE ABSCESS N/A 07/11/2021   Procedure: INCISION AND DRAINAGE RIGHT ELBOW;  Surgeon: Paralee Cancel, MD;  Location: [facility];  Service: Orthopedics;  Laterality: N/A;   PRP Bilateral OD - 07/23/2017 OS - 08/06/2017   RIGHT/LEFT HEART CATH AND CORONARY ANGIOGRAPHY N/A 01/05/2021   Procedure: RIGHT/LEFT HEART CATH AND CORONARY ANGIOGRAPHY;  Surgeon: Jolaine Artist, MD;  Location: [facility];  Service: Cardiovascular;  Laterality: N/A;   TEE WITHOUT  CARDIOVERSION N/A 10/09/2021   Procedure: TRANSESOPHAGEAL ECHOCARDIOGRAM (TEE);  Surgeon: Jolaine Artist, MD;  Location: [facility] END[facility];  Service: Cardiovascular;  Laterality: N/A;   Social History   Social History Narrative   Not on file   Immunization History  Administered Date(s) Administered   PFIZER(Purple Top)SARS-COV-2 Vaccination 02/09/2020, 03/01/2020, 11/05/2020   Pneumococcal Polysaccharide-23 05/04/2021   Tdap 07/11/2021     Objective: Vital Signs: BP (!) 189/75 (BP Location: Left Arm, Patient Position: Sitting, Cuff Size: Normal)    Pulse (!) 59    Resp 16    Ht 5\' 3"  (1.6 m)    Wt 136 lb (61.7 kg)    BMI 24.09 kg/m    Physical Exam Cardiovascular:     Rate and Rhythm: Normal rate and regular rhythm.  Pulmonary:     Effort: Pulmonary effort is normal.     Breath sounds: Normal breath sounds.  Musculoskeletal:     Right lower leg: No edema.     Left lower leg: No  edema.  Skin:    General: Skin is warm and dry.     Findings: No rash.  Neurological:     Mental Status: She is alert.  Psychiatric:        Mood and Affect: Mood normal.     Musculoskeletal Exam:  Neck full ROM no tenderness Shoulders full ROM no tenderness or swelling Elbows nodules present at olecranon bursae, ROM is intact, no tenderness to palpation, no redness or warmth Wrists full ROM no tenderness large nontender nodule on ulnar side Chronic subluxation and lateral deviation, numerous nontender nodules over dorsal side of joints from MCPs to DIPs, left 4th DIP in chronic flexion Knees full ROM no tenderness or swelling Ankles full ROM no tenderness or swelling   CDAI Exam: CDAI Score: 4  Patient Global: 10 mm; Provider Global: 30 mm Swollen: 0 ; Tender: 0  Joint Exam 01/01/2022   All documented joints were normal     Investigation: No additional findings.  Imaging: OCT, Retina - OU - Both Eyes  Result Date: 12/25/2021 Right Eye Quality was good. Scan locations included  subfoveal. Central Foveal Thickness: 255. Progression has been stable. Findings include no IRF, no SRF, abnormal foveal contour, epiretinal membrane, vitreomacular adhesion , vitreous traction (Mild inf edema). Left Eye Quality was good. Scan locations included subfoveal. Central Foveal Thickness: 277. Progression has been stable. Findings include no SRF, no IRF, abnormal foveal contour, epiretinal membrane, vitreous traction, vitreomacular adhesion . 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  We will continue to monitor and observe. OD with epiretinal membrane minor as well, with vitreomacular Clinical management:  Adherence but no traction on the macula no action will CSME, yet inferiorly significant fibrovascular disease from DM See below Abbreviations: NFP - Normal foveal profile. CME - cystoid macular edema. PED - pigment epithelial detachment. IRF - intraretinal fluid. SRF - subretinal fluid. EZ - ellipsoid zone. ERM - epiretinal membrane. ORA - outer retinal atrophy. ORT - outer retinal tubulation. SRHM - subretinal hyper-reflective material   Color Fundus Photography Optos - OU - Both Eyes  Result Date: 12/25/2021 Right Eye Progression has no prior data. Disc findings include neovascularization. Macula : microaneurysms, epiretinal membrane. Vessels : Neovascularization. Periphery : neovascularization. Left Eye Progression has no prior data. Disc findings include neovascularization. Macula : microaneurysms, epiretinal membrane. Vessels : Neovascularization. Periphery : neovascularization. Notes Bilateral active PDR with good to moderate scatter PRP OU.,  Less active overall yet small increase in preretinal hemorrhage left eye Left eye with rather massive neovascularization of the nerve disc and posteriorly in the macular region.  Left eye may need ultimate vitrectomy.  Will recommend evaluation for possible cataract surgery in each eye in the  near future so as to clear the visual axis   Recent Labs: Lab Results  Component Value Date   WBC 8.4 07/14/2021   HGB 13.0 07/14/2021   PLT 191 07/14/2021   NA 140 12/01/2021   K 4.2 12/01/2021   CL 107 12/01/2021   CO2 23 12/01/2021   GLUCOSE 119 (H) 12/01/2021   BUN 15 12/01/2021   CREATININE 1.00 12/01/2021   BILITOT 0.7 07/14/2021   ALKPHOS 97 07/14/2021   AST 14 (L) 07/14/2021   ALT 12 07/14/2021   PROT 6.5 07/14/2021   ALBUMIN 2.8 (L) 07/14/2021   CALCIUM 9.3 12/01/2021   GFRAA >60 11/29/2016    Speciality Comments: No specialty comments available.  Procedures:  No procedures performed  Allergies: Metformin and related and Farxiga [dapagliflozin]   Assessment / Plan:     Visit Diagnoses: Rheumatoid arthritis involving both hands with positive rheumatoid factor ([place]) - Plan: Sedimentation rate, C-reactive protein, Uric acid  She reports symptoms doing quite well now. Despite extensive nodules and deformities. No active synovitis on exam. Checking sed rate and CRP for arthritis activity monitoring. Rechecking uric acid off any medication but has been in goal on past tests. Plan to continue HCQ 200 mg daily.  High risk medication use  Tolerating HCQ, ophthalmology follow up without concerning findings although she has extensive changes may be limiting examination.  Septic olecranon bursitis of right elbow  Now improved and well healed. Appears to be complication from steroid injection of painful bursitis with decreased elbow mobility. No systemic infection spread no previous history of atypical infections.  Tophi  Interestingly elbow nodule consistent with rheumatoid nodule per surgical report although no pathology performed. May have a mixture of findings, extensive mass deposition disease in pure RA is unusual. Would not recommend methotrexate as a treatment option ideally with extensive nodular disease.  Cataract, nuclear sclerotic, both eyes Proliferative  diabetic retinopathy of right eye without macular edema associated with type 2 diabetes mellitus ([place]) 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 2 diabetes mellitus, unspecified proliferative retinopathy type ([place])  Following up regularly with ophthalmologist had laser surgery for proliferative retinopathy. Has plans for cataract surgery in the future as well. No concerning changes reported for hydroxychloroquine retinal toxicity screening.  Orders: Orders Placed This Encounter  Procedures   Sedimentation rate   C-reactive protein   Uric acid   No orders of the defined types were placed in this encounter.    Follow-Up Instructions: Return in about 6 months (around 07/01/2022) for RA/gout on HCQ f/u 71mos.   Collier Salina, MD  Note - This record has been created using [employer].  Chart creation errors have been sought, but may not always  have been located. Such creation errors do not reflect on  the standard of medical care.

## 2022-01-01 ENCOUNTER — Encounter: Payer: Self-pay | Admitting: Internal Medicine

## 2022-01-01 ENCOUNTER — Other Ambulatory Visit: Payer: Self-pay

## 2022-01-01 ENCOUNTER — Ambulatory Visit (INDEPENDENT_AMBULATORY_CARE_PROVIDER_SITE_OTHER): Payer: Medicare Other | Admitting: Internal Medicine

## 2022-01-01 VITALS — BP 189/75 | HR 59 | Resp 16 | Ht 63.0 in | Wt 136.0 lb

## 2022-01-01 DIAGNOSIS — M05741 Rheumatoid arthritis with rheumatoid factor of right hand without organ or systems involvement: Secondary | ICD-10-CM

## 2022-01-01 DIAGNOSIS — M71121 Other infective bursitis, right elbow: Secondary | ICD-10-CM

## 2022-01-01 DIAGNOSIS — E113592 Type 2 diabetes mellitus with proliferative diabetic retinopathy without macular edema, left eye: Secondary | ICD-10-CM

## 2022-01-01 DIAGNOSIS — E113591 Type 2 diabetes mellitus with proliferative diabetic retinopathy without macular edema, right eye: Secondary | ICD-10-CM

## 2022-01-01 DIAGNOSIS — Z79899 Other long term (current) drug therapy: Secondary | ICD-10-CM | POA: Diagnosis not present

## 2022-01-01 DIAGNOSIS — M1A9XX1 Chronic gout, unspecified, with tophus (tophi): Secondary | ICD-10-CM

## 2022-01-01 DIAGNOSIS — M05742 Rheumatoid arthritis with rheumatoid factor of left hand without organ or systems involvement: Secondary | ICD-10-CM

## 2022-01-01 DIAGNOSIS — H2513 Age-related nuclear cataract, bilateral: Secondary | ICD-10-CM

## 2022-01-02 LAB — C-REACTIVE PROTEIN: CRP: 14.1 mg/L — ABNORMAL HIGH (ref <8.0)

## 2022-01-02 LAB — URIC ACID: Uric Acid, Serum: 5.8 mg/dL (ref 2.5–7.0)

## 2022-01-02 LAB — SEDIMENTATION RATE: Sed Rate: 43 mm/h — ABNORMAL HIGH (ref 0–30)

## 2022-01-02 NOTE — Progress Notes (Signed)
Inflammation numbers are improving but still above normal. There is no specific change needed for her hydroxychloroquine. Uric acid level remains low I don't see any problem indicating gout.

## 2022-01-04 LAB — HM DIABETES EYE EXAM

## 2022-01-05 ENCOUNTER — Encounter: Payer: Self-pay | Admitting: Internal Medicine

## 2022-01-11 ENCOUNTER — Other Ambulatory Visit: Payer: Self-pay | Admitting: Medical

## 2022-01-22 ENCOUNTER — Other Ambulatory Visit (HOSPITAL_COMMUNITY): Payer: Self-pay | Admitting: Internal Medicine

## 2022-01-29 ENCOUNTER — Ambulatory Visit (INDEPENDENT_AMBULATORY_CARE_PROVIDER_SITE_OTHER): Payer: Medicare Other | Admitting: Ophthalmology

## 2022-01-29 ENCOUNTER — Other Ambulatory Visit: Payer: Self-pay

## 2022-01-29 ENCOUNTER — Encounter (INDEPENDENT_AMBULATORY_CARE_PROVIDER_SITE_OTHER): Payer: Self-pay | Admitting: Ophthalmology

## 2022-01-29 DIAGNOSIS — E113591 Type 2 diabetes mellitus with proliferative diabetic retinopathy without macular edema, right eye: Secondary | ICD-10-CM | POA: Diagnosis not present

## 2022-01-29 DIAGNOSIS — Z961 Presence of intraocular lens: Secondary | ICD-10-CM

## 2022-01-29 DIAGNOSIS — G4733 Obstructive sleep apnea (adult) (pediatric): Secondary | ICD-10-CM

## 2022-01-29 DIAGNOSIS — E113592 Type 2 diabetes mellitus with proliferative diabetic retinopathy without macular edema, left eye: Secondary | ICD-10-CM

## 2022-01-29 NOTE — Assessment & Plan Note (Signed)
Patient reports excellent compliance with CPAP and expresses her gratitude regarding improved sense of wellbeing, energy level and I explained to her we will improve the physiology of decreasing untreated sleep apnea effects on diabetic eye disease and systemic disease as well as neurological disease ?

## 2022-01-29 NOTE — Progress Notes (Signed)
01/29/2022     CHIEF COMPLAINT Patient presents for  Chief Complaint  Patient presents with   Diabetic Retinopathy without Macular Edema      HISTORY OF PRESENT ILLNESS: Maria Foster is a 66 y.o. female who presents to the clinic today for:   HPI   5 weeks dilate OU, OCT, FP, possible Avastin OD or OS. Patient states "Dr. Nile Riggs did my cataract surgery recently, things are brighter, I can see better in some ways. My left eye was done last Wednesday." Denies any new floaters or FOL. Pt states she uses "white top drop three times a day in both eyes, grey top drop three times a day in both eyes, and red top drop three times a day in both eyes from Dr. Nile Riggs for post cataract surgery." Last edited by Nelva Nay on 01/29/2022  2:00 PM.      Referring physician: Jethro Bolus, MD [address] [place],  [place] [address]  HISTORICAL INFORMATION:   Selected notes from the MEDICAL RECORD NUMBER    Lab Results  Component Value Date   HGBA1C 6.6 (A) 12/26/2021     CURRENT MEDICATIONS: No current outpatient medications on file. (Ophthalmic Drugs)   No current facility-administered medications for this visit. (Ophthalmic Drugs)   Current Outpatient Medications (Other)  Medication Sig   ACCU-CHEK GUIDE test strip See admin instructions.   Accu-Chek Softclix Lancets lancets See admin instructions.   acetaminophen (TYLENOL) 325 MG tablet Take 1-2 tablets (325-650 mg total) by mouth every 6 (six) hours as needed for mild pain (pain score 1-3 or temp > 100.5).   aspirin EC 81 MG tablet Take 81 mg by mouth every Monday, Wednesday, and Friday.   atorvastatin (LIPITOR) 40 MG tablet Take 1 tablet (40 mg total) by mouth daily.   bisacodyl (DULCOLAX) 5 MG EC tablet Take 1 tablet (5 mg total) by mouth daily as needed for moderate constipation.   Blood Glucose Monitoring Suppl (ACCU-CHEK GUIDE ME) w/Device KIT See admin instructions.   ceFAZolin (ANCEF) 10 g injection     docusate sodium (COLACE) 100 MG capsule Take 100 mg by mouth daily as needed for mild constipation.   ENTRESTO 97-103 MG TAKE 1 TABLET BY MOUTH TWICE DAILY   ferrous gluconate (FERGON) 324 MG tablet Take 1 tablet (324 mg total) by mouth 2 (two) times daily with a meal.   furosemide (LASIX) 40 MG tablet Take 1 tablet (40 mg total) by mouth daily.   guaiFENesin (MUCINEX) 600 MG 12 hr tablet Take 1 tablet (600 mg total) by mouth 2 (two) times daily.   hydrALAZINE (APRESOLINE) 50 MG tablet Take 1 tablet (50 mg total) by mouth every 8 (eight) hours.   HYDROcodone-acetaminophen (NORCO/VICODIN) 5-325 MG tablet Take 1-2 tablets by mouth every 6 (six) hours as needed for severe pain.   hydroxychloroquine (PLAQUENIL) 200 MG tablet TAKE 1 TABLET(200 MG) BY MOUTH DAILY   isosorbide mononitrate (IMDUR) 60 MG 24 hr tablet Take 1 tablet (60 mg total) by mouth daily.   metoprolol succinate (TOPROL-XL) 50 MG 24 hr tablet TAKE 1 TABLET(50 MG) BY MOUTH DAILY WITH OR IMMEDIATELY FOLLOWING A MEAL   polyethylene glycol (MIRALAX / GLYCOLAX) 17 g packet Take 17 g by mouth daily as needed for mild constipation.   potassium chloride SA (KLOR-CON) 20 MEQ tablet Take 2 tablets (40 mEq total) by mouth 2 (two) times daily.   spironolactone (ALDACTONE) 25 MG tablet Take 1 tablet (25 mg total) by  mouth daily.   TRADJENTA 5 MG TABS tablet TAKE 1 TABLET(5 MG) BY MOUTH DAILY   Vitamin D, Ergocalciferol, (DRISDOL) 1.25 MG (50000 UNIT) CAPS capsule Take 50,000 Units by mouth every Friday.   No current facility-administered medications for this visit. (Other)      REVIEW OF SYSTEMS: ROS   Negative for: Constitutional, Gastrointestinal, Neurological, Skin, Genitourinary, Musculoskeletal, HENT, Endocrine, Cardiovascular, Eyes, Respiratory, Psychiatric, Allergic/Imm, Heme/Lymph Last edited by Hurman Horn, MD on 01/29/2022  3:22 PM.       ALLERGIES Allergies  Allergen Reactions   Metformin And Related Other (See Comments)     Kidney injury, GI upset, diarrhea   Farxiga [Dapagliflozin]     Yeast infections, recurrent    PAST MEDICAL HISTORY 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   OU progressive, will medically need to have cataract surgery with lens will implantation so as to allow for complete clearance of the vitreous cavity in anticipation of likely vitrectomy in the future for progressive PDR despite previous PRP as delineated by the E DRS series of reports   Diabetes mellitus type 2 with complications ([place])    diagnosed probably 2012 per patient   Diabetic retinopathy ([place])    Hypertension    Rheumatoid arthritis ([place])    Past Surgical History:  Procedure Laterality Date   INCISION AND DRAINAGE ABSCESS N/A 07/11/2021   Procedure: INCISION AND DRAINAGE RIGHT ELBOW;  Surgeon: Paralee Cancel, MD;  Location: [facility];  Service: Orthopedics;  Laterality: N/A;   PRP Bilateral OD - 07/23/2017 OS - 08/06/2017   RIGHT/LEFT HEART CATH AND CORONARY ANGIOGRAPHY N/A 01/05/2021   Procedure: RIGHT/LEFT HEART CATH AND CORONARY ANGIOGRAPHY;  Surgeon: Jolaine Artist, MD;  Location: [facility];  Service: Cardiovascular;  Laterality: N/A;   TEE WITHOUT CARDIOVERSION N/A 10/09/2021   Procedure: TRANSESOPHAGEAL ECHOCARDIOGRAM (TEE);  Surgeon: Jolaine Artist, MD;  Location: [facility];  Service: Cardiovascular;  Laterality: N/A;    FAMILY HISTORY Family History  Problem Relation Age of Onset   Diabetes Mother    Hypertension Mother    Heart Problems Sister    Amblyopia Neg Hx    Blindness Neg Hx    Cataracts Neg Hx    Glaucoma Neg Hx    Macular degeneration Neg Hx    Retinal detachment Neg Hx    Strabismus Neg Hx    Retinitis pigmentosa Neg Hx     SOCIAL HISTORY Social History   Tobacco Use   Smoking status: Former    Packs/day: 0.50    Years: 15.00    Pack years: 7.50    Types: Cigarettes    Quit date:  11/19/2020    Years since quitting: 1.1   Smokeless tobacco: Never  Vaping Use   Vaping Use: Never used  Substance Use Topics   Alcohol use: Not Currently   Drug use: No         OPHTHALMIC EXAM:  Base Eye Exam     Visual Acuity (ETDRS)       Right Left   Dist [place] 20/25 20/30         Tonometry (Tonopen, 1:57 PM)       Right Left   Pressure 17 19         Pupils       Pupils Dark Light APD   Right PERRL 3 2 None   Left PERRL 3  2 None         Visual Fields (Counting fingers)       Left Right    Full Full         Extraocular Movement       Right Left    Full Full         Neuro/Psych     Oriented x3: Yes   Mood/Affect: Normal         Dilation     Both eyes: 1.0% Mydriacyl, 2.5% Phenylephrine @ 1:57 PM           Slit Lamp and Fundus Exam     External Exam       Right Left   External Normal Normal         Slit Lamp Exam       Right Left   Lids/Lashes Normal Normal   Conjunctiva/Sclera Temporal and Nasal Pinguecula; racial melanosis Temporal and Nasal Pinguecula; racial melanosis   Cornea Clear Clear   Anterior Chamber Deep and quiet Deep and quiet   Iris dilated to 51mm; no NVI dilated to 6.62mm; no NVI   Lens 2+ Nuclear sclerosis, 3+ Cortical cataract 2+ Nuclear sclerosis, 3+ Cortical cataract   Anterior Vitreous Vitreous syneresis Vitreous syneresis; small clumps of VH inferiorlly; VH settling inferiorly          Fundus Exam       Right Left   Posterior Vitreous No PVD, small scattered preretinal hemorrhages inferiorly No PVD, small scattered preretinal hemorrhages inferiorly   Disc NVD extension along the inferotemporal arcade appears fibrotic reactive Massive NVD, greater than 12 disc areas in size   C/D Ratio 0.6 0.5   Macula DBH; early NVE temporally; with fibrous changes large region of nontreated retina in this area cotton wool spots sup; macular neovascularization temporal aspect of the macula   Vessels NVE along  inferiortemp  arcade, superonasal to disc NVE at 1030, peripherally at equator   [facility]; attached; scattered dot and blot hemorrhages 360; scattered PRP 360 w/ room for fill-in; pre-retinal heme above disc;  DBH; attached; scattered dot and blot hemorrhages 360; PRP laser, room for fill in temporally and inferiorly;             IMAGING AND PROCEDURES  Imaging and Procedures for 01/29/22  OCT, Retina - OU - Both Eyes       Right Eye Quality was good. Scan locations included subfoveal. Central Foveal Thickness: 254. Progression has been stable. Findings include no IRF, no SRF, abnormal foveal contour, epiretinal membrane, vitreomacular adhesion , vitreous traction (Mild inf edema).   Left Eye Quality was good. Scan locations included subfoveal. Central Foveal Thickness: 278. Progression has been stable. Findings include no SRF, no IRF, abnormal foveal contour, epiretinal membrane, vitreous traction, vitreomacular adhesion .   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  We will continue to monitor and observe. OD with epiretinal membrane minor as well, with vitreomacular partial detachment, no impact on acuity will continue to observe   Clinical management:  Adherence but no traction on the macula no action will CSME, yet inferiorly significant fibrovascular disease from DM   See below  Abbreviations: NFP - Normal foveal profile. CME - cystoid macular edema. PED - pigment epithelial detachment. IRF - intraretinal fluid. SRF - subretinal fluid. EZ - ellipsoid zone. ERM - epiretinal membrane. ORA - outer retinal atrophy. ORT - outer retinal tubulation. SRHM -  subretinal hyper-reflective material       Color Fundus Photography Optos - OU - Both Eyes       Right Eye Progression has no prior data. Disc findings include neovascularization. Macula : microaneurysms, epiretinal membrane. Vessels :  Neovascularization. Periphery : neovascularization.   Left Eye Progression has no prior data. Disc findings include neovascularization. Macula : microaneurysms, epiretinal membrane. Vessels : Neovascularization. Periphery : neovascularization.   Notes Bilateral active PDR with good to moderate scatter PRP OU.,  Less active overall yet small increase in preretinal hemorrhage left eye, with good PRP added  Left eye with rather massive neovascularization of the nerve disc and posteriorly in the macular region.  Left eye may need ultimate vitrectomy.  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 type 2 diabetes mellitus (HCC) OD, stabilized PDR, post PRP additional, and now patient has resumed compliance with CPAP which will prevent nightly low oxygen making her brain and her eyes develop worse physiologic complication  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 2 diabetes mellitus (HCC) OS with rather large neovascular tuft still present on posterior pole examination However with good PRP in with good acuity and no tractional change on the fovea we have the option to continue to monitor this by observation alone  OSA (obstructive sleep apnea) Patient reports excellent compliance with CPAP and expresses her gratitude regarding improved sense of wellbeing, energy level and I explained to her we will improve the physiology of decreasing untreated sleep apnea effects on diabetic eye disease and systemic disease as well as neurological disease  Pseudophakia, both eyes OU, stable after recent cataract surgery with Dr. Rutherford Guys looks great we will continue to monitor     ICD-10-CM   1. Proliferative diabetic retinopathy of right eye without macular  edema associated with type 2 diabetes mellitus (HCC)  E11.3591 OCT, Retina - OU - Both Eyes    Color Fundus Photography Optos - OU - Both Eyes    2. 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 2 diabetes mellitus, unspecified proliferative retinopathy type (HCC)  W11.9147 OCT, Retina - OU - Both Eyes    Color Fundus Photography Optos - OU - Both Eyes    3. OSA (obstructive sleep apnea)  G47.33     4. Pseudophakia, both eyes  Z96.1       1.  2.  3.  Ophthalmic Meds Ordered this visit:  No orders of the defined types were placed in this encounter.      Return in about 3 months (around 05/01/2022) for COLOR FP, OCT, DILATE OU.  There are no Patient Instructions on file for this visit.   Explained the diagnoses, plan, and follow up with the patient and they expressed understanding.  Patient expressed understanding of the importance of proper follow up care.   Clent Demark Maedell Hedger M.D. Diseases & Surgery of the Retina and Vitreous [facility]ina [facility] Diabetic [place] 01/29/22     Abbreviations: M myopia (nearsighted); A astigmatism; H hyperopia (farsighted); P presbyopia; Mrx spectacle prescription;  CTL contact lenses; OD right eye; OS left eye; OU both eyes  XT exotropia; ET esotropia; PEK punctate epithelial keratitis; PEE punctate epithelial erosions; DES dry eye syndrome; MGD  meibomian gland dysfunction; ATs artificial tears; PFAT's preservative free artificial tears; [place] nuclear sclerotic cataract; PSC posterior subcapsular cataract; ERM epi-retinal membrane; PVD posterior vitreous detachment; RD retinal detachment; DM diabetes mellitus; DR diabetic retinopathy; NPDR non-proliferative diabetic retinopathy; PDR proliferative diabetic retinopathy; CSME clinically significant macular edema; DME diabetic macular edema; dbh dot blot hemorrhages; CWS cotton wool spot; POAG primary open angle glaucoma; C/D cup-to-disc ratio; HVF humphrey visual field; GVF goldmann visual  field; OCT optical coherence tomography; IOP intraocular pressure; BRVO Branch retinal vein occlusion; CRVO central retinal vein occlusion; CRAO central retinal artery occlusion; BRAO branch retinal artery occlusion; RT retinal tear; SB scleral buckle; PPV pars plana vitrectomy; VH Vitreous hemorrhage; PRP panretinal laser photocoagulation; IVK intravitreal kenalog; VMT vitreomacular traction; MH Macular hole;  NVD neovascularization of the disc; NVE neovascularization elsewhere; AREDS age related eye disease study; ARMD age related macular degeneration; POAG primary open angle glaucoma; EBMD epithelial/anterior basement membrane dystrophy; ACIOL anterior chamber intraocular lens; IOL intraocular lens; PCIOL posterior chamber intraocular lens; Phaco/IOL phacoemulsification with intraocular lens placement; [place] photorefractive keratectomy; LASIK laser assisted in situ keratomileusis; HTN hypertension; DM diabetes mellitus; COPD chronic obstructive pulmonary disease

## 2022-01-29 NOTE — Assessment & Plan Note (Signed)
OU, stable after recent cataract surgery with Dr. Rutherford Guys looks great we will continue to monitor ?

## 2022-01-29 NOTE — Assessment & Plan Note (Signed)
OS with rather large neovascular tuft still present on posterior pole examination However with good PRP in with good acuity and no tractional change on the fovea we have the option to continue to monitor this by observation alone ?

## 2022-01-29 NOTE — Assessment & Plan Note (Signed)
OD, stabilized PDR, post PRP additional, and now patient has resumed compliance with CPAP which will prevent nightly low oxygen making her brain and her eyes develop worse physiologic complication ?

## 2022-03-09 ENCOUNTER — Other Ambulatory Visit: Payer: Self-pay | Admitting: Internal Medicine

## 2022-03-09 ENCOUNTER — Other Ambulatory Visit: Payer: Self-pay | Admitting: Medical

## 2022-03-09 DIAGNOSIS — M069 Rheumatoid arthritis, unspecified: Secondary | ICD-10-CM

## 2022-03-16 ENCOUNTER — Other Ambulatory Visit (HOSPITAL_COMMUNITY): Payer: Self-pay | Admitting: *Deleted

## 2022-03-16 DIAGNOSIS — I5040 Unspecified combined systolic (congestive) and diastolic (congestive) heart failure: Secondary | ICD-10-CM

## 2022-03-16 MED ORDER — ATORVASTATIN CALCIUM 40 MG PO TABS: 40.0000 mg | ORAL_TABLET | Freq: Every day | ORAL | 3 refills | Status: DC

## 2022-04-08 ENCOUNTER — Other Ambulatory Visit (HOSPITAL_COMMUNITY): Payer: Self-pay | Admitting: Internal Medicine

## 2022-04-11 ENCOUNTER — Other Ambulatory Visit (HOSPITAL_COMMUNITY): Payer: Self-pay

## 2022-04-11 MED ORDER — HYDRALAZINE HCL 50 MG PO TABS: 50.0000 mg | ORAL_TABLET | Freq: Three times a day (TID) | ORAL | 6 refills | Status: DC

## 2022-05-01 ENCOUNTER — Encounter (INDEPENDENT_AMBULATORY_CARE_PROVIDER_SITE_OTHER): Payer: Medicare Other | Admitting: Ophthalmology

## 2022-05-08 ENCOUNTER — Ambulatory Visit (INDEPENDENT_AMBULATORY_CARE_PROVIDER_SITE_OTHER): Payer: Medicare Other | Admitting: Ophthalmology

## 2022-05-08 ENCOUNTER — Encounter (INDEPENDENT_AMBULATORY_CARE_PROVIDER_SITE_OTHER): Payer: Self-pay | Admitting: Ophthalmology

## 2022-05-08 ENCOUNTER — Encounter (INDEPENDENT_AMBULATORY_CARE_PROVIDER_SITE_OTHER): Payer: Medicare Other | Admitting: Ophthalmology

## 2022-05-08 DIAGNOSIS — E113592 Type 2 diabetes mellitus with proliferative diabetic retinopathy without macular edema, left eye: Secondary | ICD-10-CM

## 2022-05-08 DIAGNOSIS — E113591 Type 2 diabetes mellitus with proliferative diabetic retinopathy without macular edema, right eye: Secondary | ICD-10-CM | POA: Diagnosis not present

## 2022-05-08 DIAGNOSIS — Z961 Presence of intraocular lens: Secondary | ICD-10-CM

## 2022-05-08 NOTE — Assessment & Plan Note (Signed)
OD, stable over time.  Observe

## 2022-05-08 NOTE — Assessment & Plan Note (Signed)
Clear media now looks great OU

## 2022-05-08 NOTE — Assessment & Plan Note (Signed)
OS with massive NVD in the past.  Largely stable not likely new preretinal hemorrhage from tractional changes.  In any case we will need to deliver additional PRP in this region so as to minimize the risk of PDR progression.  With good acuity will not hasten to take the patient's left eye to surgical intervention.  High risk for chronic syndrome with intravitreal of Avastin or other antivegF.  Thus add PRP OS today

## 2022-05-08 NOTE — Progress Notes (Signed)
05/08/2022     CHIEF COMPLAINT Patient presents for  Chief Complaint  Patient presents with   Diabetic Retinopathy without Macular Edema      HISTORY OF PRESENT ILLNESS: Maria Foster is a 66 y.o. female who presents to the clinic today for:   HPI   3 MOS for COLOR FP, OCT, DILATE OU. Pt stated vision has been stable since last visit.  Last edited by Silvestre Moment on 05/08/2022  2:49 PM.      Referring physician: Carlena Hurl, PA-C [place],  [place] [address]  HISTORICAL INFORMATION:   Selected notes from the MEDICAL RECORD NUMBER    Lab Results  Component Value Date   HGBA1C 6.6 (A) 12/26/2021     CURRENT MEDICATIONS: No current outpatient medications on file. (Ophthalmic Drugs)   No current facility-administered medications for this visit. (Ophthalmic Drugs)   Current Outpatient Medications (Other)  Medication Sig   ACCU-CHEK GUIDE test strip See admin instructions.   Accu-Chek Softclix Lancets lancets See admin instructions.   acetaminophen (TYLENOL) 325 MG tablet Take 1-2 tablets (325-650 mg total) by mouth every 6 (six) hours as needed for mild pain (pain score 1-3 or temp > 100.5).   aspirin EC 81 MG tablet Take 81 mg by mouth every Monday, Wednesday, and Friday.   atorvastatin (LIPITOR) 40 MG tablet Take 1 tablet (40 mg total) by mouth daily.   bisacodyl (DULCOLAX) 5 MG EC tablet Take 1 tablet (5 mg total) by mouth daily as needed for moderate constipation.   Blood Glucose Monitoring Suppl (ACCU-CHEK GUIDE ME) w/Device KIT See admin instructions.   ceFAZolin (ANCEF) 10 g injection    docusate sodium (COLACE) 100 MG capsule Take 100 mg by mouth daily as needed for mild constipation.   ENTRESTO 97-103 MG TAKE 1 TABLET BY MOUTH TWICE DAILY   ferrous gluconate (FERGON) 324 MG tablet TAKE 1 TABLET(324 MG) BY MOUTH TWICE DAILY WITH A MEAL   furosemide (LASIX) 40 MG tablet Take 1 tablet (40 mg total) by mouth daily.   guaiFENesin (MUCINEX) 600  MG 12 hr tablet Take 1 tablet (600 mg total) by mouth 2 (two) times daily.   hydrALAZINE (APRESOLINE) 50 MG tablet Take 1 tablet (50 mg total) by mouth every 8 (eight) hours.   HYDROcodone-acetaminophen (NORCO/VICODIN) 5-325 MG tablet Take 1-2 tablets by mouth every 6 (six) hours as needed for severe pain.   hydroxychloroquine (PLAQUENIL) 200 MG tablet TAKE 1 TABLET(200 MG) BY MOUTH DAILY   isosorbide mononitrate (IMDUR) 60 MG 24 hr tablet TAKE 1 TABLET(60 MG) BY MOUTH DAILY   metoprolol succinate (TOPROL-XL) 50 MG 24 hr tablet TAKE 1 TABLET(50 MG) BY MOUTH DAILY WITH OR IMMEDIATELY FOLLOWING A MEAL   polyethylene glycol (MIRALAX / GLYCOLAX) 17 g packet Take 17 g by mouth daily as needed for mild constipation.   potassium chloride SA (KLOR-CON) 20 MEQ tablet Take 2 tablets (40 mEq total) by mouth 2 (two) times daily.   spironolactone (ALDACTONE) 25 MG tablet Take 1 tablet (25 mg total) by mouth daily.   TRADJENTA 5 MG TABS tablet TAKE 1 TABLET(5 MG) BY MOUTH DAILY   Vitamin D, Ergocalciferol, (DRISDOL) 1.25 MG (50000 UNIT) CAPS capsule Take 50,000 Units by mouth every Friday.   No current facility-administered medications for this visit. (Other)      REVIEW OF SYSTEMS: ROS   Negative for: Constitutional, Gastrointestinal, Neurological, Skin, Genitourinary, Musculoskeletal, HENT, Endocrine, Cardiovascular, Eyes, Respiratory, Psychiatric, Allergic/Imm, Heme/Lymph Last edited  by Silvestre Moment on 05/08/2022  2:49 PM.       ALLERGIES Allergies  Allergen Reactions   Metformin And Related Other (See Comments)    Kidney injury, GI upset, diarrhea   Farxiga [Dapagliflozin]     Yeast infections, recurrent    PAST MEDICAL HISTORY 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   OU progressive, will medically need to have cataract surgery with lens will implantation so as to allow for complete clearance of the vitreous cavity in  anticipation of likely vitrectomy in the future for progressive PDR despite previous PRP as delineated by the E DRS series of reports   Diabetes mellitus type 2 with complications ([place])    diagnosed probably 2012 per patient   Diabetic retinopathy ([place])    Hypertension    Rheumatoid arthritis ([place])    Past Surgical History:  Procedure Laterality Date   INCISION AND DRAINAGE ABSCESS N/A 07/11/2021   Procedure: INCISION AND DRAINAGE RIGHT ELBOW;  Surgeon: Paralee Cancel, MD;  Location: [facility];  Service: Orthopedics;  Laterality: N/A;   PRP Bilateral OD - 07/23/2017 OS - 08/06/2017   RIGHT/LEFT HEART CATH AND CORONARY ANGIOGRAPHY N/A 01/05/2021   Procedure: RIGHT/LEFT HEART CATH AND CORONARY ANGIOGRAPHY;  Surgeon: Jolaine Artist, MD;  Location: [facility];  Service: Cardiovascular;  Laterality: N/A;   TEE WITHOUT CARDIOVERSION N/A 10/09/2021   Procedure: TRANSESOPHAGEAL ECHOCARDIOGRAM (TEE);  Surgeon: Jolaine Artist, MD;  Location: [facility];  Service: Cardiovascular;  Laterality: N/A;    FAMILY HISTORY Family History  Problem Relation Age of Onset   Diabetes Mother    Hypertension Mother    Heart Problems Sister    Amblyopia Neg Hx    Blindness Neg Hx    Cataracts Neg Hx    Glaucoma Neg Hx    Macular degeneration Neg Hx    Retinal detachment Neg Hx    Strabismus Neg Hx    Retinitis pigmentosa Neg Hx     SOCIAL HISTORY Social History   Tobacco Use   Smoking status: Former    Packs/day: 0.50    Years: 15.00    Total pack years: 7.50    Types: Cigarettes    Quit date: 11/19/2020    Years since quitting: 1.4   Smokeless tobacco: Never  Vaping Use   Vaping Use: Never used  Substance Use Topics   Alcohol use: Not Currently   Drug use: No         OPHTHALMIC EXAM:  Base Eye Exam     Visual Acuity (ETDRS)       Right Left   Dist [place] 20/25 -2 20/25 -2         Tonometry (Tonopen, 2:54 PM)       Right Left   Pressure 14 15         Pupils        Pupils APD   Right PERRL None   Left PERRL None         Visual Fields       Left Right    Full Full         Extraocular Movement       Right Left    Full Full         Neuro/Psych     Oriented x3: Yes   Mood/Affect: Normal         Dilation  Both eyes: 1.0% Mydriacyl, 2.5% Phenylephrine @ 2:54 PM           Slit Lamp and Fundus Exam     External Exam       Right Left   External Normal Normal         Slit Lamp Exam       Right Left   Lids/Lashes Normal Normal   Conjunctiva/Sclera Temporal and Nasal Pinguecula; racial melanosis Temporal and Nasal Pinguecula; racial melanosis   Cornea Clear Clear   Anterior Chamber Deep and quiet Deep and quiet   Iris dilated to 65m; no NVI dilated to 6.539m no NVI   Lens Posterior chamber intraocular lens, Centered posterior chamber intraocular lens Posterior chamber intraocular lens, Centered posterior chamber intraocular lens   Anterior Vitreous Vitreous syneresis Vitreous syneresis; small clumps of VH inferiorlly; VH settling inferiorly          Fundus Exam       Right Left   Posterior Vitreous No PVD, small scattered preretinal hemorrhages inferiorly No PVD, larger preretinal hemorrhage inferonasal to nerve   Disc NVD extension along the inferotemporal arcade appears fibrotic reactive Massive NVD, greater than 12 disc areas in size,    C/D Ratio 0.6 0.5   Macula DBH; early NVE temporally; with fibrous changes large region of nontreated retina in this area macular neovascularization temporal aspect of the macula   Vessels NVE along inferiortemp  arcade, superonasal to disc NVE at 1030, peripherally at equator   [facility]; scattered dot and blot hemorrhages 360; good scattered PRP  360; pre-retinal heme superonasal to  disc; DBH; attached; scattered dot and blot hemorrhages 360; PRP laser, room for fill in temporally and inferiorly;            IMAGING AND PROCEDURES  Imaging and  Procedures for 05/08/22  OCT, Retina - OU - Both Eyes       Right Eye Quality was good. Scan locations included subfoveal. Central Foveal Thickness: 361. Progression has been stable. Findings include no IRF, no SRF, abnormal foveal contour, epiretinal membrane, vitreous traction, vitreomacular adhesion (Mild inf edema).   Left Eye Quality was good. Scan locations included subfoveal. Central Foveal Thickness: 307. Progression has been stable. Findings include no IRF, no SRF, abnormal foveal contour, epiretinal membrane, vitreous traction, vitreomacular adhesion .   Notes OU with partial PVD over the macular region yet attached along the temporal arcades.  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  We will continue to monitor and observe. OD with epiretinal membrane minor as well, with vitreomacular partial detachment, no impact on acuity will continue to observe   Clinical management:  Adherence but no traction on the macula no action will CSME, yet inferiorly significant fibrovascular disease from DM   See below  Abbreviations: NFP - Normal foveal profile. CME - cystoid macular edema. PED - pigment epithelial detachment. IRF - intraretinal fluid. SRF - subretinal fluid. EZ - ellipsoid zone. ERM - epiretinal membrane. ORA - outer retinal atrophy. ORT - outer retinal tubulation. SRHM - subretinal hyper-reflective material       Color Fundus Photography Optos - OU - Both Eyes       Right Eye Progression has been stable. Disc findings include neovascularization. Macula : epiretinal membrane, microaneurysms. Vessels : Neovascularization. Periphery : neovascularization.   Left Eye Progression has worsened. Disc findings include neovascularization. Macula : epiretinal membrane, microaneurysms. Vessels : Neovascularization. Periphery :  neovascularization.   Notes Bilateral active PDR with good to moderate scatter PRP OU.,   Less active overall yet small increase in preretinal hemorrhage left eye, with good PRP added  Left eye with rather massive neovascularization of the nerve disc and posteriorly in the macular region.  Left eye may need ultimate vitrectomy.       Panretinal Photocoagulation - OS - Left Eye       Anesthesia Topical anesthesia was used. Anesthetic medications included Proparacaine 0.5%.   Laser Information The type of laser was diode. Color was yellow. The duration in seconds was 0.02. The spot size was 390 microns. Laser power was 320. Total spots was 684.   Post-op The patient tolerated the procedure well. There were no complications. The patient received written and verbal post procedure care education.              ASSESSMENT/PLAN:  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 2 diabetes mellitus (HCC) OS with massive NVD in the past.  Largely stable not likely new preretinal hemorrhage from tractional changes.  In any case we will need to deliver additional PRP in this region so as to minimize the risk of PDR progression.  With good acuity will not hasten to take the patient's left eye to surgical intervention.  High risk for chronic syndrome with intravitreal of Avastin or other antivegF.  Thus add PRP OS today  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 type 2 diabetes mellitus ([place]) OD, stable over time.  Observe  Pseudophakia, both eyes Clear media now looks great OU     ICD-10-CM   1. 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 2 diabetes mellitus, unspecified proliferative retinopathy type ([place])  E83.1517 Panretinal Photocoagulation - OS - Left Eye    2. 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 Pseudophakia,  both eyes  Z96.1       1.  2.  3.  Ophthalmic Meds Ordered this visit:  No orders of the defined types were placed in this encounter.      Return in about 4 weeks (around 06/05/2022) for COLOR FP, DILATE OU.  There are no Patient Instructions on file for this visit.   Explained the diagnoses, plan, and follow up with the patient and they expressed understanding.  Patient expressed understanding of the importance of proper follow up care.   Clent Demark Jaykub Mackins M.D. Diseases & Surgery of the Retina and Vitreous Retina & Diabetic [place] 05/08/22     Abbreviations: M myopia (nearsighted); A astigmatism; H hyperopia (farsighted); P presbyopia; Mrx spectacle prescription;  CTL contact lenses; OD right eye; OS left eye; OU both eyes  XT exotropia; ET esotropia; PEK punctate epithelial keratitis; PEE punctate epithelial erosions; DES dry eye syndrome; MGD meibomian gland dysfunction; ATs artificial tears; PFAT's preservative free artificial tears; [place] nuclear sclerotic cataract; PSC posterior subcapsular cataract; ERM epi-retinal membrane; PVD posterior vitreous detachment; RD retinal detachment; DM diabetes mellitus; DR diabetic retinopathy; NPDR non-proliferative diabetic retinopathy; PDR proliferative diabetic retinopathy; CSME clinically significant macular edema; DME diabetic macular edema; dbh dot blot hemorrhages; CWS cotton wool spot; POAG primary open angle glaucoma; C/D cup-to-disc ratio; HVF humphrey visual field; GVF goldmann visual field; OCT optical coherence tomography; IOP intraocular pressure; BRVO  Branch retinal vein occlusion; CRVO central retinal vein occlusion; CRAO central retinal artery occlusion; BRAO branch retinal artery occlusion; RT retinal tear; SB scleral buckle; PPV pars plana vitrectomy; VH Vitreous hemorrhage; PRP panretinal laser photocoagulation; IVK intravitreal kenalog; VMT vitreomacular traction; MH Macular hole;  NVD neovascularization of the disc; NVE  neovascularization elsewhere; AREDS age related eye disease study; ARMD age related macular degeneration; POAG primary open angle glaucoma; EBMD epithelial/anterior basement membrane dystrophy; ACIOL anterior chamber intraocular lens; IOL intraocular lens; PCIOL posterior chamber intraocular lens; Phaco/IOL phacoemulsification with intraocular lens placement; [place] photorefractive keratectomy; LASIK laser assisted in situ keratomileusis; HTN hypertension; DM diabetes mellitus; COPD chronic obstructive pulmonary disease

## 2022-06-02 ENCOUNTER — Other Ambulatory Visit: Payer: Self-pay | Admitting: Medical

## 2022-06-03 ENCOUNTER — Other Ambulatory Visit: Payer: Self-pay | Admitting: Medical

## 2022-06-05 ENCOUNTER — Ambulatory Visit (INDEPENDENT_AMBULATORY_CARE_PROVIDER_SITE_OTHER): Payer: Medicare Other | Admitting: Ophthalmology

## 2022-06-05 ENCOUNTER — Encounter (INDEPENDENT_AMBULATORY_CARE_PROVIDER_SITE_OTHER): Payer: Self-pay | Admitting: Ophthalmology

## 2022-06-05 DIAGNOSIS — E113592 Type 2 diabetes mellitus with proliferative diabetic retinopathy without macular edema, left eye: Secondary | ICD-10-CM | POA: Diagnosis not present

## 2022-06-05 DIAGNOSIS — E113591 Type 2 diabetes mellitus with proliferative diabetic retinopathy without macular edema, right eye: Secondary | ICD-10-CM

## 2022-06-05 NOTE — Progress Notes (Signed)
06/05/2022     CHIEF COMPLAINT Patient presents for  Chief Complaint  Patient presents with   Diabetic Retinopathy without Macular Edema      HISTORY OF PRESENT ILLNESS: Maria Foster is a 66 y.o. female who presents to the clinic today for:   HPI   4 weeks for COLOR FP, DILATE OU. Pt stated vision has not changed since last visit.   Last edited by Silvestre Moment on 06/05/2022  2:16 PM.      Referring physician: Warden Fillers, MD [address] [place],  [place] [phone]  HISTORICAL INFORMATION:   Selected notes from the MEDICAL RECORD NUMBER    Lab Results  Component Value Date   HGBA1C 6.6 (A) 12/26/2021     CURRENT MEDICATIONS: No current outpatient medications on file. (Ophthalmic Drugs)   No current facility-administered medications for this visit. (Ophthalmic Drugs)   Current Outpatient Medications (Other)  Medication Sig   ACCU-CHEK GUIDE test strip See admin instructions.   Accu-Chek Softclix Lancets lancets See admin instructions.   acetaminophen (TYLENOL) 325 MG tablet Take 1-2 tablets (325-650 mg total) by mouth every 6 (six) hours as needed for mild pain (pain score 1-3 or temp > 100.5).   aspirin EC 81 MG tablet Take 81 mg by mouth every Monday, Wednesday, and Friday.   atorvastatin (LIPITOR) 40 MG tablet Take 1 tablet (40 mg total) by mouth daily.   bisacodyl (DULCOLAX) 5 MG EC tablet Take 1 tablet (5 mg total) by mouth daily as needed for moderate constipation.   Blood Glucose Monitoring Suppl (ACCU-CHEK GUIDE ME) w/Device KIT See admin instructions.   ceFAZolin (ANCEF) 10 g injection    docusate sodium (COLACE) 100 MG capsule Take 100 mg by mouth daily as needed for mild constipation.   ENTRESTO 97-103 MG TAKE 1 TABLET BY MOUTH TWICE DAILY   ferrous gluconate (FERGON) 324 MG tablet TAKE 1 TABLET(324 MG) BY MOUTH TWICE DAILY WITH A MEAL   furosemide (LASIX) 40 MG tablet Take 1 tablet (40 mg total) by mouth daily.   guaiFENesin (MUCINEX)  600 MG 12 hr tablet Take 1 tablet (600 mg total) by mouth 2 (two) times daily.   hydrALAZINE (APRESOLINE) 50 MG tablet Take 1 tablet (50 mg total) by mouth every 8 (eight) hours.   HYDROcodone-acetaminophen (NORCO/VICODIN) 5-325 MG tablet Take 1-2 tablets by mouth every 6 (six) hours as needed for severe pain.   hydroxychloroquine (PLAQUENIL) 200 MG tablet TAKE 1 TABLET(200 MG) BY MOUTH DAILY   isosorbide mononitrate (IMDUR) 60 MG 24 hr tablet TAKE 1 TABLET(60 MG) BY MOUTH DAILY   metoprolol succinate (TOPROL-XL) 50 MG 24 hr tablet TAKE 1 TABLET(50 MG) BY MOUTH DAILY WITH OR IMMEDIATELY FOLLOWING A MEAL   polyethylene glycol (MIRALAX / GLYCOLAX) 17 g packet Take 17 g by mouth daily as needed for mild constipation.   potassium chloride SA (KLOR-CON) 20 MEQ tablet Take 2 tablets (40 mEq total) by mouth 2 (two) times daily.   spironolactone (ALDACTONE) 25 MG tablet Take 1 tablet (25 mg total) by mouth daily.   TRADJENTA 5 MG TABS tablet TAKE 1 TABLET(5 MG) BY MOUTH DAILY   Vitamin D, Ergocalciferol, (DRISDOL) 1.25 MG (50000 UNIT) CAPS capsule Take 50,000 Units by mouth every Friday.   No current facility-administered medications for this visit. (Other)      REVIEW OF SYSTEMS: ROS   Negative for: Constitutional, Gastrointestinal, Neurological, Skin, Genitourinary, Musculoskeletal, HENT, Endocrine, Cardiovascular, Eyes, Respiratory, Psychiatric, Allergic/Imm, Heme/Lymph  Last edited by Silvestre Moment on 06/05/2022  2:17 PM.       ALLERGIES Allergies  Allergen Reactions   Metformin And Related Other (See Comments)    Kidney injury, GI upset, diarrhea   Farxiga [Dapagliflozin]     Yeast infections, recurrent    PAST MEDICAL HISTORY 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   OU progressive, will medically need to have cataract surgery with lens will implantation so as to allow for complete clearance of the vitreous  cavity in anticipation of likely vitrectomy in the future for progressive PDR despite previous PRP as delineated by the E DRS series of reports   Diabetes mellitus type 2 with complications ([place])    diagnosed probably 2012 per patient   Diabetic retinopathy ([place])    Hypertension    Rheumatoid arthritis ([place])    Past Surgical History:  Procedure Laterality Date   INCISION AND DRAINAGE ABSCESS N/A 07/11/2021   Procedure: INCISION AND DRAINAGE RIGHT ELBOW;  Surgeon: Paralee Cancel, MD;  Location: [facility];  Service: Orthopedics;  Laterality: N/A;   PRP Bilateral OD - 07/23/2017 OS - 08/06/2017   RIGHT/LEFT HEART CATH AND CORONARY ANGIOGRAPHY N/A 01/05/2021   Procedure: RIGHT/LEFT HEART CATH AND CORONARY ANGIOGRAPHY;  Surgeon: Jolaine Artist, MD;  Location: [facility];  Service: Cardiovascular;  Laterality: N/A;   TEE WITHOUT CARDIOVERSION N/A 10/09/2021   Procedure: TRANSESOPHAGEAL ECHOCARDIOGRAM (TEE);  Surgeon: Jolaine Artist, MD;  Location: [facility]COPY;  Service: Cardiovascular;  Laterality: N/A;    FAMILY HISTORY Family History  Problem Relation Age of Onset   Diabetes Mother    Hypertension Mother    Heart Problems Sister    Amblyopia Neg Hx    Blindness Neg Hx    Cataracts Neg Hx    Glaucoma Neg Hx    Macular degeneration Neg Hx    Retinal detachment Neg Hx    Strabismus Neg Hx    Retinitis pigmentosa Neg Hx     SOCIAL HISTORY Social History   Tobacco Use   Smoking status: Former    Packs/day: 0.50    Years: 15.00    Total pack years: 7.50    Types: Cigarettes    Quit date: 11/19/2020    Years since quitting: 1.5   Smokeless tobacco: Never  Vaping Use   Vaping Use: Never used  Substance Use Topics   Alcohol use: Not Currently   Drug use: No         OPHTHALMIC EXAM:  Base Eye Exam     Visual Acuity (ETDRS)       Right Left   Dist [place] 20/30 -1 20/30 -2   Dist ph [place] NI 20/25 -2         Tonometry (Tonopen, 2:21 PM)       Right Left    Pressure 18 19         Pupils       Pupils APD   Right PERRL None   Left PERRL None         Visual Fields       Left Right    Full Full         Extraocular Movement       Right Left    Full Full         Neuro/Psych     Oriented x3: Yes   Mood/Affect: Normal  Dilation     Both eyes: 1.0% Mydriacyl, 2.5% Phenylephrine @ 2:21 PM           Slit Lamp and Fundus Exam     External Exam       Right Left   External Normal Normal         Slit Lamp Exam       Right Left   Lids/Lashes Normal Normal   Conjunctiva/Sclera Temporal and Nasal Pinguecula; racial melanosis Temporal and Nasal Pinguecula; racial melanosis   Cornea Clear Clear   Anterior Chamber Deep and quiet Deep and quiet   Iris dilated to 70m; no NVI dilated to 6.516m no NVI   Lens Posterior chamber intraocular lens, Centered posterior chamber intraocular lens Posterior chamber intraocular lens, Centered posterior chamber intraocular lens   Anterior Vitreous Vitreous syneresis Vitreous syneresis; small clumps of VH inferiorlly; VH settling inferiorly          Fundus Exam       Right Left   Posterior Vitreous No PVD, small scattered preretinal hemorrhages inferiorly No PVD, larger preretinal hemorrhage inferonasal to nerve   Disc NVD extension along the inferotemporal arcade appears fibrotic reactive Massive NVD, greater than 12 disc areas in size,    C/D Ratio 0.6 0.5   Macula DBH; early NVE temporally; with fibrous changes large region of nontreated retina in this area macular neovascularization temporal aspect of the macula   Vessels NVE along inferiortemp  arcade, superonasal to disc NVE at 1081peripherally at equator   [facility]; scattered dot and blot hemorrhages 360; good scattered PRP  360; pre-retinal heme superonasal to  disc;Room superotemporal and inferonasal for completion of PRP DBH; attached; scattered dot and blot hemorrhages 360; PRP laser, room for  fill in nasal to preretinal hemorrhage and inferiorly;            IMAGING AND PROCEDURES  Imaging and Procedures for 06/05/22  Color Fundus Photography Optos - OU - Both Eyes       Right Eye Progression has been stable. Disc findings include neovascularization. Macula : epiretinal membrane, microaneurysms. Vessels : Neovascularization. Periphery : neovascularization.   Left Eye Progression has worsened. Disc findings include neovascularization. Macula : epiretinal membrane, microaneurysms. Vessels : Neovascularization. Periphery : neovascularization.   Notes Bilateral active PDR with good to moderate scatter PRP OU.,  Less active overall yet small increase in preretinal hemorrhage left eye, with good PRP added  Left eye with rather massive neovascularization of the nerve disc and posteriorly in the macular region.  Left eye may need ultimate vitrectomy.               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 Tight control of glucose, blood pressure, and serum lipids was recommended in order to slow further progression of disease. Cessation of smoking was recommended. Treatment options including ocular injectable medications, panretinal photocoagulation and/or vitrectomy surgery for advanced disease were discussed. Local medical therapy may slow or arrest progression of disease.  Last treatment with PRP OD was January 2023, there is still some room quadrantsbSuperiorly and inferotemporally in the right eye.  It is necessary because of the extensive retinal nonperfusion (white lines) noted temporally inferotemporally and superiorly.  This will hopefully prevent the posterior pole neovascularization from progressing and so  Almyra Free avoid  tractional changes or impact on acuity or progression of PDR with VH.  The need for this was explained the patient know and while I understand she does not like having the laser treatment she understands it is her best chance to prevent progression of disease such that surgical intervention would be required.  I also explained to her that I would not let anyone operate on this side as long as good vision is present unless traction retinal detachment developing and actively threatening her visual functioning centrally  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 2 diabetes mellitus ([place])  Last treatment with PRP OD was June  2023, there is still some room quadrants nasally and inferotemporally in the left eye.  It is necessary because of the extensive retinal nonperfusion (white lines) noted temporally inferotemporally and superiorly.  This will hopefully prevent the posterior pole neovascularization from progressing and try to avoid tractional changes or impact on acuity or progression of PDR with VH.  The need for this was explained the patient know and while I understand she does not like having the laser treatment she understands it is her best chance to prevent progression of disease such that surgical intervention would be required.  I also explained to her that I would not let anyone operate on this side as long as good vision is present unless traction retinal detachment developing and actively threatening her visual functioning centrally      ICD-10-CM   1. 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 2 diabetes mellitus, unspecified proliferative retinopathy type ([place])  K86.3817       1.  Follow-up OD for PRP next week per patient request, July 25  2.  OS will  need completion nasally and inferotemporally a PRP in the future as well  3.  Ophthalmic Meds Ordered this visit:  No orders of the defined types were placed in this encounter.      Return in about 1 week (around 06/12/2022) for OD, PRP per request July 25.  There are no Patient Instructions on file for this visit.   Explained the diagnoses, plan, and follow up with the patient and they expressed understanding.  Patient expressed understanding of the importance of proper follow up care.   Clent Demark Emmerson Taddei M.D. Diseases & Surgery of the Retina and Vitreous Retina & Diabetic [place] 06/05/22     Abbreviations: M myopia (nearsighted); A astigmatism; H hyperopia (farsighted); P presbyopia; Mrx spectacle prescription;  CTL contact lenses; OD right eye; OS left eye; OU both eyes  XT exotropia; ET esotropia; PEK punctate epithelial keratitis; PEE punctate epithelial erosions; DES dry eye syndrome; MGD meibomian gland dysfunction; ATs artificial tears; PFAT's preservative free artificial tears; [place] nuclear sclerotic cataract; PSC posterior subcapsular cataract; ERM epi-retinal membrane; PVD posterior vitreous detachment; RD retinal detachment; DM diabetes mellitus; DR diabetic retinopathy; NPDR non-proliferative diabetic retinopathy; PDR proliferative diabetic retinopathy; CSME clinically significant macular edema; DME diabetic macular edema; dbh dot blot hemorrhages; CWS cotton wool spot; POAG primary open angle glaucoma; C/D cup-to-disc ratio; HVF humphrey visual field; GVF goldmann visual field; OCT optical coherence tomography; IOP intraocular pressure; BRVO Branch retinal vein occlusion; CRVO central retinal vein occlusion; CRAO central retinal artery occlusion; BRAO branch retinal artery occlusion; RT retinal tear; SB scleral buckle;  PPV pars plana vitrectomy; VH Vitreous hemorrhage; PRP panretinal laser photocoagulation; IVK intravitreal kenalog; VMT vitreomacular traction; MH Macular hole;   NVD neovascularization of the disc; NVE neovascularization elsewhere; AREDS age related eye disease study; ARMD age related macular degeneration; POAG primary open angle glaucoma; EBMD epithelial/anterior basement membrane dystrophy; ACIOL anterior chamber intraocular lens; IOL intraocular lens; PCIOL posterior chamber intraocular lens; Phaco/IOL phacoemulsification with intraocular lens placement; [place] photorefractive keratectomy; LASIK laser assisted in situ keratomileusis; HTN hypertension; DM diabetes mellitus; COPD chronic obstructive pulmonary disease

## 2022-06-05 NOTE — Assessment & Plan Note (Signed)
Last treatment with PRP OD was June  2023, there is still some room quadrants nasally and inferotemporally in the left eye.  It is necessary because of the extensive retinal nonperfusion (white lines) noted temporally inferotemporally and superiorly.  This will hopefully prevent the posterior pole neovascularization from progressing and try to avoid tractional changes or impact on acuity or progression of PDR with VH.  The need for this was explained the patient know and while I understand she does not like having the laser treatment she understands it is her best chance to prevent progression of disease such that surgical intervention would be required.  I also explained to her that I would not let anyone operate on this side as long as good vision is present unless traction retinal detachment developing and actively threatening her visual functioning centrally

## 2022-06-05 NOTE — Assessment & Plan Note (Signed)
The nature of proliferative diabetic retinopathy was discussed with the patient as well as the potential for a vitreous hemorrhage and traction on the retina. Tight control of glucose, blood pressure, and serum lipids was recommended in order to slow further progression of disease. Cessation of smoking was recommended. Treatment options including ocular injectable medications, panretinal photocoagulation and/or vitrectomy surgery for advanced disease were discussed. Local medical therapy may slow or arrest progression of disease.  Last treatment with PRP OD was January 2023, there is still some room quadrantsbSuperiorly and inferotemporally in the right eye.  It is necessary because of the extensive retinal nonperfusion (white lines) noted temporally inferotemporally and superiorly.  This will hopefully prevent the posterior pole neovascularization from progressing and so Raynelle Fanning avoid tractional changes or impact on acuity or progression of PDR with VH.  The need for this was explained the patient know and while I understand she does not like having the laser treatment she understands it is her best chance to prevent progression of disease such that surgical intervention would be required.  I also explained to her that I would not let anyone operate on this side as long as good vision is present unless traction retinal detachment developing and actively threatening her visual functioning centrally

## 2022-06-06 ENCOUNTER — Encounter (INDEPENDENT_AMBULATORY_CARE_PROVIDER_SITE_OTHER): Payer: Medicare Other | Admitting: Ophthalmology

## 2022-06-12 ENCOUNTER — Encounter (INDEPENDENT_AMBULATORY_CARE_PROVIDER_SITE_OTHER): Payer: Medicare Other | Admitting: Ophthalmology

## 2022-06-12 ENCOUNTER — Encounter (INDEPENDENT_AMBULATORY_CARE_PROVIDER_SITE_OTHER): Payer: Self-pay | Admitting: Ophthalmology

## 2022-06-12 ENCOUNTER — Ambulatory Visit (INDEPENDENT_AMBULATORY_CARE_PROVIDER_SITE_OTHER): Payer: Medicare Other | Admitting: Ophthalmology

## 2022-06-12 DIAGNOSIS — E113591 Type 2 diabetes mellitus with proliferative diabetic retinopathy without macular edema, right eye: Secondary | ICD-10-CM

## 2022-06-12 NOTE — Progress Notes (Signed)
06/12/2022     CHIEF COMPLAINT Patient presents for  Chief Complaint  Patient presents with   Diabetic Retinopathy without Macular Edema      HISTORY OF PRESENT ILLNESS: Maria Foster is a 66 y.o. female who presents to the clinic today for:   HPI   1 week OD PRP for PDR and its progression.  Room inferotemporal to the temporal arcade and nasal to nerve PRP today nasal Pt states her vision has been stable Pt denies any new floaters or FOL  Last edited by Hurman Horn, MD on 06/12/2022  3:45 PM.      Referring physician: Rutherford Guys, [place],  [place] [address]  HISTORICAL INFORMATION:   Selected notes from the MEDICAL RECORD NUMBER    Lab Results  Component Value Date   HGBA1C 6.6 (A) 12/26/2021     CURRENT MEDICATIONS: No current outpatient medications on file. (Ophthalmic Drugs)   No current facility-administered medications for this visit. (Ophthalmic Drugs)   Current Outpatient Medications (Other)  Medication Sig   ACCU-CHEK GUIDE test strip See admin instructions.   Accu-Chek Softclix Lancets lancets See admin instructions.   acetaminophen (TYLENOL) 325 MG tablet Take 1-2 tablets (325-650 mg total) by mouth every 6 (six) hours as needed for mild pain (pain score 1-3 or temp > 100.5).   aspirin EC 81 MG tablet Take 81 mg by mouth every Monday, Wednesday, and Friday.   atorvastatin (LIPITOR) 40 MG tablet Take 1 tablet (40 mg total) by mouth daily.   bisacodyl (DULCOLAX) 5 MG EC tablet Take 1 tablet (5 mg total) by mouth daily as needed for moderate constipation.   Blood Glucose Monitoring Suppl (ACCU-CHEK GUIDE ME) w/Device KIT See admin instructions.   ceFAZolin (ANCEF) 10 g injection    docusate sodium (COLACE) 100 MG capsule Take 100 mg by mouth daily as needed for mild constipation.   ENTRESTO 97-103 MG TAKE 1 TABLET BY MOUTH TWICE DAILY   ferrous gluconate (FERGON) 324 MG tablet TAKE 1 TABLET(324 MG) BY MOUTH TWICE DAILY WITH A  MEAL   furosemide (LASIX) 40 MG tablet Take 1 tablet (40 mg total) by mouth daily.   guaiFENesin (MUCINEX) 600 MG 12 hr tablet Take 1 tablet (600 mg total) by mouth 2 (two) times daily.   hydrALAZINE (APRESOLINE) 50 MG tablet Take 1 tablet (50 mg total) by mouth every 8 (eight) hours.   HYDROcodone-acetaminophen (NORCO/VICODIN) 5-325 MG tablet Take 1-2 tablets by mouth every 6 (six) hours as needed for severe pain.   hydroxychloroquine (PLAQUENIL) 200 MG tablet TAKE 1 TABLET(200 MG) BY MOUTH DAILY   isosorbide mononitrate (IMDUR) 60 MG 24 hr tablet TAKE 1 TABLET(60 MG) BY MOUTH DAILY   metoprolol succinate (TOPROL-XL) 50 MG 24 hr tablet TAKE 1 TABLET(50 MG) BY MOUTH DAILY WITH OR IMMEDIATELY FOLLOWING A MEAL   polyethylene glycol (MIRALAX / GLYCOLAX) 17 g packet Take 17 g by mouth daily as needed for mild constipation.   potassium chloride SA (KLOR-CON) 20 MEQ tablet Take 2 tablets (40 mEq total) by mouth 2 (two) times daily.   spironolactone (ALDACTONE) 25 MG tablet Take 1 tablet (25 mg total) by mouth daily.   TRADJENTA 5 MG TABS tablet TAKE 1 TABLET(5 MG) BY MOUTH DAILY   Vitamin D, Ergocalciferol, (DRISDOL) 1.25 MG (50000 UNIT) CAPS capsule Take 50,000 Units by mouth every Friday.   No current facility-administered medications for this visit. (Other)      REVIEW OF  SYSTEMS: ROS   Negative for: Constitutional, Gastrointestinal, Neurological, Skin, Genitourinary, Musculoskeletal, HENT, Endocrine, Cardiovascular, Eyes, Respiratory, Psychiatric, Allergic/Imm, Heme/Lymph Last edited by Morene Rankins, CMA on 06/12/2022  3:08 PM.       ALLERGIES Allergies  Allergen Reactions   Metformin And Related Other (See Comments)    Kidney injury, GI upset, diarrhea   Farxiga [Dapagliflozin]     Yeast infections, recurrent    PAST MEDICAL HISTORY 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   OU progressive, will  medically need to have cataract surgery with lens will implantation so as to allow for complete clearance of the vitreous cavity in anticipation of likely vitrectomy in the future for progressive PDR despite previous PRP as delineated by the E DRS series of reports   Diabetes mellitus type 2 with complications ([place])    diagnosed probably 2012 per patient   Diabetic retinopathy ([place])    Hypertension    Rheumatoid arthritis ([place])    Past Surgical History:  Procedure Laterality Date   INCISION AND DRAINAGE ABSCESS N/A 07/11/2021   Procedure: INCISION AND DRAINAGE RIGHT ELBOW;  Surgeon: Paralee Cancel, MD;  Location: [facility];  Service: Orthopedics;  Laterality: N/A;   PRP Bilateral OD - 07/23/2017 OS - 08/06/2017   RIGHT/LEFT HEART CATH AND CORONARY ANGIOGRAPHY N/A 01/05/2021   Procedure: RIGHT/LEFT HEART CATH AND CORONARY ANGIOGRAPHY;  Surgeon: Jolaine Artist, MD;  Location: [facility];  Service: Cardiovascular;  Laterality: N/A;   TEE WITHOUT CARDIOVERSION N/A 10/09/2021   Procedure: TRANSESOPHAGEAL ECHOCARDIOGRAM (TEE);  Surgeon: Jolaine Artist, MD;  Location: [facility];  Service: Cardiovascular;  Laterality: N/A;    FAMILY HISTORY Family History  Problem Relation Age of Onset   Diabetes Mother    Hypertension Mother    Heart Problems Sister    Amblyopia Neg Hx    Blindness Neg Hx    Cataracts Neg Hx    Glaucoma Neg Hx    Macular degeneration Neg Hx    Retinal detachment Neg Hx    Strabismus Neg Hx    Retinitis pigmentosa Neg Hx     SOCIAL HISTORY Social History   Tobacco Use   Smoking status: Former    Packs/day: 0.50    Years: 15.00    Total pack years: 7.50    Types: Cigarettes    Quit date: 11/19/2020    Years since quitting: 1.5   Smokeless tobacco: Never  Vaping Use   Vaping Use: Never used  Substance Use Topics   Alcohol use: Not Currently   Drug use: No         OPHTHALMIC EXAM:  Base Eye Exam     Visual Acuity (ETDRS)       Right  Left   Dist [place] 20/30 +3 20/20 -1         Tonometry (Tonopen, 3:13 PM)       Right Left   Pressure 18 14         Pupils       Pupils APD   Right PERRL None   Left PERRL None         Extraocular Movement       Right Left    Full Full         Neuro/Psych     Oriented x3: Yes   Mood/Affect: Normal         Dilation  Right eye: 2.5% Phenylephrine, 1.0% Mydriacyl @ 3:10 PM           Slit Lamp and Fundus Exam     External Exam       Right Left   External Normal Normal         Slit Lamp Exam       Right Left   Lids/Lashes Normal Normal   Conjunctiva/Sclera Temporal and Nasal Pinguecula; racial melanosis Temporal and Nasal Pinguecula; racial melanosis   Cornea Clear Clear   Anterior Chamber Deep and quiet Deep and quiet   Iris dilated to 1m; no NVI dilated to 6.576m no NVI   Lens Posterior chamber intraocular lens, Centered posterior chamber intraocular lens Posterior chamber intraocular lens, Centered posterior chamber intraocular lens   Anterior Vitreous Vitreous syneresis Vitreous syneresis; small clumps of VH inferiorlly; VH settling inferiorly          Fundus Exam       Right Left   Posterior Vitreous No PVD, small scattered preretinal hemorrhages inferiorly    Disc NVD extension along the inferotemporal arcade appears fibrotic reactive    C/D Ratio 0.6    Macula DBH; early NVE temporally; with fibrous changes large region of nontreated retina in this area    Vessels NVE along inferiortemp  arcade, superonasal to disc    Periphery DBH; attached; scattered dot and blot hemorrhages 360; good scattered PRP  360; pre-retinal heme superonasal to  disc;Room inferotemporal and inferonasal for completion of PRP             IMAGING AND PROCEDURES  Imaging and Procedures for 06/12/22  Color Fundus Photography Optos - OU - Both Eyes       Right Eye Progression has been stable. Disc findings include neovascularization. Macula :  epiretinal membrane, microaneurysms. Vessels : Neovascularization. Periphery : neovascularization.   Left Eye Progression has worsened. Disc findings include neovascularization. Macula : epiretinal membrane, microaneurysms. Vessels : Neovascularization. Periphery : neovascularization.   Notes Bilateral active PDR with good to moderate scatter PRP OU.,  Less active overall yet small increase in preretinal hemorrhage left eye, with good PRP added  Left eye with rather massive neovascularization of the nerve disc and posteriorly in the macular region.  Left eye may need ultimate vitrectomy.       Panretinal Photocoagulation - OD - Right Eye       Anesthesia Topical anesthesia was used. Anesthetic medications included Proparacaine 0.5%.   Laser Information The type of laser was diode. Color was yellow. The duration in seconds was 0.03. The spot size was 390 microns. Laser power was 250. Total spots was 378.   Post-op The patient tolerated the procedure well. There were no complications.   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  PRP filling pattern completed today inferotemporal inferotemporal arcade and nasal to nerve OD.  2.  3.  Ophthalmic Meds Ordered this visit:  No orders of the  defined types were placed in this encounter.      Return in about 3 months (around 09/12/2022) for DILATE OU, COLOR FP, OCT.  There are no Patient Instructions on file for this  visit.   Explained the diagnoses, plan, and follow up with the patient and they expressed understanding.  Patient expressed understanding of the importance of proper follow up care.   Clent Demark Anaiza Behrens M.D. Diseases & Surgery of the Retina and Vitreous Retina [facility] Diabetic [place] 06/12/22     Abbreviations: M myopia (nearsighted); A astigmatism; H hyperopia (farsighted); P presbyopia; Mrx spectacle prescription;  CTL contact lenses; OD right eye; OS left eye; OU both eyes  XT exotropia; ET esotropia; PEK punctate epithelial keratitis; PEE punctate epithelial erosions; DES dry eye syndrome; MGD meibomian gland dysfunction; ATs artificial tears; PFAT's preservative free artificial tears; [place] nuclear sclerotic cataract; PSC posterior subcapsular cataract; ERM epi-retinal membrane; PVD posterior vitreous detachment; RD retinal detachment; DM diabetes mellitus; DR diabetic retinopathy; NPDR non-proliferative diabetic retinopathy; PDR proliferative diabetic retinopathy; CSME clinically significant macular edema; DME diabetic macular edema; dbh dot blot hemorrhages; CWS cotton wool spot; POAG primary open angle glaucoma; C/D cup-to-disc ratio; HVF humphrey visual field; GVF goldmann visual field; OCT optical coherence tomography; IOP intraocular pressure; BRVO Branch retinal vein occlusion; CRVO central retinal vein occlusion; CRAO central retinal artery occlusion; BRAO branch retinal artery occlusion; RT retinal tear; SB scleral buckle; PPV pars plana vitrectomy; VH Vitreous hemorrhage; PRP panretinal laser photocoagulation; IVK intravitreal kenalog; VMT vitreomacular traction; MH Macular hole;  NVD neovascularization of the disc; NVE neovascularization elsewhere; AREDS age related eye disease study; ARMD age related macular degeneration; POAG primary open angle glaucoma; EBMD epithelial/anterior basement membrane dystrophy; ACIOL anterior chamber intraocular lens; IOL intraocular lens; PCIOL posterior chamber  intraocular lens; Phaco/IOL phacoemulsification with intraocular lens placement; [place] photorefractive keratectomy; LASIK laser assisted in situ keratomileusis; HTN hypertension; DM diabetes mellitus; COPD chronic obstructive pulmonary disease

## 2022-06-12 NOTE — Assessment & Plan Note (Signed)
Peripheral PRP delivered today further quiescent old neovascular fibrotic tissue prevent progression

## 2022-06-20 NOTE — Progress Notes (Signed)
Office Visit Note  Patient: Maria Foster             Date of Birth: 07/25/1956           MRN: [id number]             PCP: Jac Canavan, PA-C Referring: Jac Canavan, PA-C Visit Date: 07/02/2022   Subjective:  Follow-up (Wants to ask about medications, feels like condition hasn't changed.)   History of Present Illness: Maria Foster is a 66 y.o. 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  She feels like symptoms have made no significant difference on this treatment.  No recurrence of severe flareups like she had in her elbow either.  She does notice a few new and expanding nodules with episodes of spontaneous rupturing and white fluid drainage. Does not have much joint pain or swelling outside of the nodules.  Previous HPI 01/01/2022 Maria Foster is a 66 y.o. female here for follow up for seropositive RA on HCQ 200 mg daily. After our last visit with right elbow injection she developed worsening pain and then purulent drainage found to be MSSA infection of that right olecranon bursa and went to the hospital for surgical debridement of the wound. Wound culture was obtained also surgical report of rheumatoid nodule at the site. She took a course of antibiotics with PICC line completed without incident. She resumed hydroxychloroquine after this and she feels her joint pain is very well controlled at this time. She notices some increase in stiffness and pain with cold and changing weather.   Previous HPI 07/03/21 Maria Foster is a 66 y.o. female here for follow up for seropositive RA and chronic tophaceous gout. Recommended starting hydroxychloroquine treatment 200 mg PO daily for suspected RA as active disease and normal uric levels. She feels her symptoms are improved since starting this, particularly states pain with weather and temperature changes are decreased. She has right elbow pain more than elsewhere says this hurts mostly due to  pressure on the site when lying in bed or certain seated positions.   Previous HPI: 03/29/21 Maria Foster is a 66 y.o. female referred here for evaluation of rheumatoid arthritis.  She has a longstanding history of bilateral hand and knee pain that has been going on for years with some progressive worsening and deformity over time.  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  She also has chronic knee pain and stiffness bilaterally without any large nodular changes.  She does not have much joint pain most days however experiences some episodically and also feels that her hands and knees hurt worse with cold and rainy weather.  She describes a history of rheumatoid arthritis but has never been on treatment plans for this.  She states she has never had any episode of gout involving the toes or feet but has been told by her cardiologist this is a problem that she has.   She does not recall ever undergoing bone density screening for osteoporosis. She has a long smoking history but in recent months is either not smoking or very reduced usage most days.  No Rheumatology ROS completed.   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 type 2 diabetes mellitus (HCC) 11/16/2021   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 2 diabetes mellitus (HCC) 11/16/2021   OSA (obstructive sleep apnea) 08/25/2021   Iron deficiency 08/25/2021   Screening for cancer 08/25/2021   Influenza vaccination declined 08/25/2021   Prolonged QT interval 07/11/2021   Stage 3a chronic kidney disease (HCC) 07/11/2021   Septic olecranon bursitis of right elbow 07/11/2021    Essential hypertension, benign 05/04/2021   Hyperlipidemia associated with type 2 diabetes mellitus (HCC) 05/04/2021   Enlarged and hypertrophic nails 05/04/2021   Encounter for screening mammogram for malignant neoplasm of breast 05/04/2021   Need for pneumococcal vaccination 05/04/2021   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HCC) 05/04/2021   Stable angina (HCC) 05/04/2021   Vaccine counseling 05/04/2021   Tobacco use 05/04/2021   High risk medication use 03/29/2021   Screen for colon cancer 01/27/2021   Tophi 01/11/2021   Rheumatoid arthritis involving both hands (HCC) 01/11/2021   Elevated troponin    Diabetes (HCC) 12/19/2020   Constipation 12/19/2020    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   OU progressive, will medically need to have cataract surgery with lens will implantation so as to allow for complete clearance of the vitreous cavity in anticipation of likely vitrectomy in the future for progressive PDR despite previous PRP as delineated by the E DRS series of reports   Diabetes mellitus type 2 with complications (HCC)    diagnosed probably 2012 per patient   Diabetic retinopathy (HCC)    Hypertension    Rheumatoid arthritis (HCC)     Family History  Problem Relation Age of Onset   Diabetes Mother    Hypertension Mother    Heart Problems Sister    Amblyopia Neg Hx    Blindness Neg Hx    Cataracts Neg Hx    Glaucoma Neg Hx    Macular degeneration Neg Hx    Retinal detachment Neg Hx    Strabismus Neg Hx    Retinitis pigmentosa Neg Hx    Past Surgical History:  Procedure Laterality Date   INCISION AND DRAINAGE ABSCESS N/A 07/11/2021   Procedure: INCISION AND DRAINAGE RIGHT ELBOW;  Surgeon: Durene Romans, MD;  Location: [facility];  Service: Orthopedics;  Laterality: N/A;   PRP Bilateral OD - 07/23/2017 OS - 08/06/2017   RIGHT/LEFT HEART CATH AND CORONARY ANGIOGRAPHY N/A  01/05/2021   Procedure: RIGHT/LEFT HEART CATH AND CORONARY ANGIOGRAPHY;  Surgeon: Dolores Patty, MD;  Location: [facility];  Service: Cardiovascular;  Laterality: N/A;   TEE WITHOUT CARDIOVERSION N/A 10/09/2021   Procedure: TRANSESOPHAGEAL ECHOCARDIOGRAM (TEE);  Surgeon: Dolores Patty, MD;  Location: [facility];  Service: Cardiovascular;  Laterality: N/A;   Social History   Social History Narrative   Not on file   Immunization History  Administered Date(s) Administered   PFIZER(Purple Top)SARS-COV-2 Vaccination 02/09/2020, 03/01/2020, 11/05/2020   Pneumococcal Polysaccharide-23 05/04/2021   Tdap 07/11/2021     Objective: Vital Signs: BP (!) 192/77 (BP Location: Left Arm, Patient Position: Sitting, Cuff Size: Normal)   Pulse (!) 53   Resp 14   Ht 5\' 3"  (1.6 m)   Wt 141 lb 9.6 oz (64.2 kg)   BMI 25.08 kg/m    Physical Exam Cardiovascular:     Rate and Rhythm:  Normal rate and regular rhythm.  Pulmonary:     Effort: Pulmonary effort is normal.     Breath sounds: Normal breath sounds.  Musculoskeletal:     Right lower leg: No edema.     Left lower leg: No edema.  Skin:    General: Skin is warm and dry.  Neurological:     Mental Status: She is alert.  Psychiatric:        Mood and Affect: Mood normal.      Musculoskeletal Exam:  Shoulders full ROM no tenderness or swelling Elbows, wrists, and finger joints with full ROM no tenderness or palpable synovitis, bulky nodules overlying elbows and forearm extensor surfaces, over dorsum of MCPs and several small nodules throughout fingers Knees full ROM no tenderness or swelling Ankles full ROM no tenderness or swelling   CDAI Exam: CDAI Score: 2  Patient Global: 10 mm; Provider Global: 10 mm Swollen: 0 ; Tender: 0  Joint Exam 07/02/2022   All documented joints were normal     Investigation: No additional findings.  Imaging: Color Fundus Photography Optos - OU - Both Eyes  Result Date:  06/12/2022 Right Eye Progression has been stable. Disc findings include neovascularization. Macula : epiretinal membrane, microaneurysms. Vessels : Neovascularization. Periphery : neovascularization. Left Eye Progression has worsened. Disc findings include neovascularization. Macula : epiretinal membrane, microaneurysms. Vessels : Neovascularization. Periphery : neovascularization. Notes Bilateral active PDR with good to moderate scatter PRP OU.,  Less active overall yet small increase in preretinal hemorrhage left eye, with good PRP added Left eye with rather massive neovascularization of the nerve disc and posteriorly in the macular region.  Left eye may need ultimate vitrectomy.    Panretinal Photocoagulation - OD - Right Eye  Result Date: 06/12/2022 Anesthesia Topical anesthesia was used. Anesthetic medications included Proparacaine 0.5%. Laser Information The type of laser was diode. Color was yellow. The duration in seconds was 0.03. The spot size was 390 microns. Laser power was 250. Total spots was 378. Post-op The patient tolerated the procedure well. There were no complications. Notes Applied inf temporal to arcade and nasal to nerve  Color Fundus Photography Optos - OU - Both Eyes  Result Date: 06/05/2022 Right Eye Progression has been stable. Disc findings include neovascularization. Macula : epiretinal membrane, microaneurysms. Vessels : Neovascularization. Periphery : neovascularization. Left Eye Progression has worsened. Disc findings include neovascularization. Macula : epiretinal membrane, microaneurysms. Vessels : Neovascularization. Periphery : neovascularization. Notes Bilateral active PDR with good to moderate scatter PRP OU.,  Less active overall yet small increase in preretinal hemorrhage left eye, with good PRP added Left eye with rather massive neovascularization of the nerve disc and posteriorly in the macular region.  Left eye may need ultimate vitrectomy.     Recent Labs: Lab  Results  Component Value Date   WBC 8.4 07/14/2021   HGB 13.0 07/14/2021   PLT 191 07/14/2021   NA 140 12/01/2021   K 4.2 12/01/2021   CL 107 12/01/2021   CO2 23 12/01/2021   GLUCOSE 119 (H) 12/01/2021   BUN 15 12/01/2021   CREATININE 1.00 12/01/2021   BILITOT 0.7 07/14/2021   ALKPHOS 97 07/14/2021   AST 14 (L) 07/14/2021   ALT 12 07/14/2021   PROT 6.5 07/14/2021   ALBUMIN 2.8 (L) 07/14/2021   CALCIUM 9.3 12/01/2021   GFRAA >60 11/29/2016    Speciality Comments: No specialty comments available.  Procedures:  No procedures performed Allergies: Metformin and related and Farxiga [dapagliflozin]   Assessment /  Plan:     Visit Diagnoses: Rheumatoid arthritis involving both hands with positive rheumatoid factor ([place]) - Plan: Sedimentation rate  Minimal inflammatory symptom complaints and not much pain but she still has some impaired use of her hands related to the numerous very large nodules.  Checking sedimentation rate for disease activity monitoring.  Continue hydroxychloroquine 200 mg daily at this time but discussed she may benefit with a change in therapy.  High risk medication use - HCQ 200 mg daily. - Plan: CBC with Differential/Platelet, COMPLETE METABOLIC PANEL WITH GFR, Hepatitis B core antibody, IgM, Hepatitis B surface antigen, Hepatitis C antibody  Recent ophthalmology exam okay without evidence of hydroxychloroquine toxicity but she does have extensive unrelated changes.  Due to lack of improvement in extensive nodules need to consider additional DMARD treatment possibly methotrexate.  Checking CBC and CMP also hepatitis screening baseline lab monitoring.  Septic olecranon bursitis of right elbow  Elbow symptoms have remained improved ever since her incision and drainage and antibiotics treatment.  Large nodule deposit remains unchanged.  Tophi - Plan: Uric acid  Still unclear whether tophaceous deposits remaining versus all subcutaneous rheumatoid nodules.   Rechecking uric acid off urate lowering therapy.   Orders: Orders Placed This Encounter  Procedures   CBC with Differential/Platelet   COMPLETE METABOLIC PANEL WITH GFR   Hepatitis B core antibody, IgM   Hepatitis B surface antigen   Hepatitis C antibody   Sedimentation rate   Uric acid   No orders of the defined types were placed in this encounter.    Follow-Up Instructions: Return in about 6 weeks (around 08/13/2022) for RA MTX start f/u 6wks.   Collier Salina, MD  Note - This record has been created using [employer].  Chart creation errors have been sought, but may not always  have been located. Such creation errors do not reflect on  the standard of medical care.

## 2022-07-02 ENCOUNTER — Encounter: Payer: Self-pay | Admitting: Internal Medicine

## 2022-07-02 ENCOUNTER — Ambulatory Visit: Payer: Medicare Other | Attending: Internal Medicine | Admitting: Internal Medicine

## 2022-07-02 VITALS — BP 192/77 | HR 53 | Resp 14 | Ht 63.0 in | Wt 141.6 lb

## 2022-07-02 DIAGNOSIS — M05741 Rheumatoid arthritis with rheumatoid factor of right hand without organ or systems involvement: Secondary | ICD-10-CM | POA: Diagnosis not present

## 2022-07-02 DIAGNOSIS — Z79899 Other long term (current) drug therapy: Secondary | ICD-10-CM

## 2022-07-02 DIAGNOSIS — M05742 Rheumatoid arthritis with rheumatoid factor of left hand without organ or systems involvement: Secondary | ICD-10-CM

## 2022-07-02 DIAGNOSIS — H2513 Age-related nuclear cataract, bilateral: Secondary | ICD-10-CM

## 2022-07-02 DIAGNOSIS — M71121 Other infective bursitis, right elbow: Secondary | ICD-10-CM

## 2022-07-02 DIAGNOSIS — M1A9XX1 Chronic gout, unspecified, with tophus (tophi): Secondary | ICD-10-CM | POA: Diagnosis not present

## 2022-07-03 ENCOUNTER — Other Ambulatory Visit: Payer: Self-pay | Admitting: Medical

## 2022-07-03 LAB — CBC WITH DIFFERENTIAL/PLATELET
Absolute Monocytes: 422 cells/uL (ref 200–950)
Basophils Absolute: 68 cells/uL (ref 0–200)
Basophils Relative: 1.1 %
Eosinophils Absolute: 192 cells/uL (ref 15–500)
Eosinophils Relative: 3.1 %
HCT: 40.3 % (ref 35.0–45.0)
Hemoglobin: 12.9 g/dL (ref 11.7–15.5)
Lymphs Abs: 1190 cells/uL (ref 850–3900)
MCH: 29.1 pg (ref 27.0–33.0)
MCHC: 32 g/dL (ref 32.0–36.0)
MCV: 91 fL (ref 80.0–100.0)
MPV: 10.3 fL (ref 7.5–12.5)
Monocytes Relative: 6.8 %
Neutro Abs: 4328 cells/uL (ref 1500–7800)
Neutrophils Relative %: 69.8 %
Platelets: 193 10*3/uL (ref 140–400)
RBC: 4.43 10*6/uL (ref 3.80–5.10)
RDW: 12.9 % (ref 11.0–15.0)
Total Lymphocyte: 19.2 %
WBC: 6.2 10*3/uL (ref 3.8–10.8)

## 2022-07-03 LAB — COMPLETE METABOLIC PANEL WITH GFR
AG Ratio: 1.1 (calc) (ref 1.0–2.5)
ALT: 12 U/L (ref 6–29)
AST: 13 U/L (ref 10–35)
Albumin: 3.9 g/dL (ref 3.6–5.1)
Alkaline phosphatase (APISO): 117 U/L (ref 37–153)
BUN/Creatinine Ratio: 21 (calc) (ref 6–22)
BUN: 29 mg/dL — ABNORMAL HIGH (ref 7–25)
CO2: 23 mmol/L (ref 20–32)
Calcium: 9.2 mg/dL (ref 8.6–10.4)
Chloride: 108 mmol/L (ref 98–110)
Creat: 1.35 mg/dL — ABNORMAL HIGH (ref 0.50–1.05)
Globulin: 3.6 g/dL (calc) (ref 1.9–3.7)
Glucose, Bld: 129 mg/dL — ABNORMAL HIGH (ref 65–99)
Potassium: 5.3 mmol/L (ref 3.5–5.3)
Sodium: 137 mmol/L (ref 135–146)
Total Bilirubin: 0.6 mg/dL (ref 0.2–1.2)
Total Protein: 7.5 g/dL (ref 6.1–8.1)
eGFR: 43 mL/min/{1.73_m2} — ABNORMAL LOW (ref >=60)

## 2022-07-03 LAB — HEPATITIS B SURFACE ANTIGEN: Hepatitis B Surface Ag: NONREACTIVE

## 2022-07-03 LAB — HEPATITIS C ANTIBODY: Hepatitis C Ab: NONREACTIVE

## 2022-07-03 LAB — HEPATITIS B CORE ANTIBODY, IGM: Hep B C IgM: NONREACTIVE

## 2022-07-03 LAB — URIC ACID: Uric Acid, Serum: 6 mg/dL (ref 2.5–7.0)

## 2022-07-03 LAB — SEDIMENTATION RATE: Sed Rate: 39 mm/h — ABNORMAL HIGH (ref 0–30)

## 2022-08-02 ENCOUNTER — Other Ambulatory Visit: Payer: Self-pay | Admitting: *Deleted

## 2022-08-02 MED ORDER — ENTRESTO 97-103 MG PO TABS: 1.0000 | ORAL_TABLET | Freq: Two times a day (BID) | ORAL | 1 refills | Status: DC

## 2022-08-02 NOTE — Progress Notes (Deleted)
Office Visit Note  Patient: Maria Foster             Date of Birth: 20-Mar-1956           MRN: [id number]             PCP: Jac Canavan, PA-C Referring: Jac Canavan, PA-C Visit Date: 08/14/2022   Subjective:  No chief complaint on file.   History of Present Illness: Maria Foster is a 66 y.o. female here for follow up for follow up seropositive RA    Previous HPI 07/02/2022 Maria Foster is a 66 y.o. 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  She feels like symptoms have made no significant difference on this treatment.  No recurrence of severe flareups like she had in her elbow either.  She does notice a few new and expanding nodules with episodes of spontaneous rupturing and white fluid drainage. Does not have much joint pain or swelling outside of the nodules.   Previous HPI 01/01/2022 Maria Foster is a 66 y.o. female here for follow up for seropositive RA on HCQ 200 mg daily. After our last visit with right elbow injection she developed worsening pain and then purulent drainage found to be MSSA infection of that right olecranon bursa and went to the hospital for surgical debridement of the wound. Wound culture was obtained also surgical report of rheumatoid nodule at the site. She took a course of antibiotics with PICC line completed without incident. She resumed hydroxychloroquine after this and she feels her joint pain is very well controlled at this time. She notices some increase in stiffness and pain with cold and changing weather.   Previous HPI 07/03/21 Maria Foster is a 66 y.o. female here for follow up for seropositive RA and chronic tophaceous gout. Recommended starting hydroxychloroquine treatment 200 mg PO daily for suspected RA as active disease and normal uric levels. She feels her symptoms are improved since starting this, particularly states pain with weather and temperature changes are decreased. She has right  elbow pain more than elsewhere says this hurts mostly due to pressure on the site when lying in bed or certain seated positions.   Previous HPI: 03/29/21 Maria Foster is a 66 y.o. female referred here for evaluation of rheumatoid arthritis.  She has a longstanding history of bilateral hand and knee pain that has been going on for years with some progressive worsening and deformity over time.  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  She also has chronic knee pain and stiffness bilaterally without any large nodular changes.  She does not have much joint pain most days however experiences some episodically and also feels that her hands and knees hurt worse with cold and rainy weather.  She describes a history of rheumatoid arthritis but has never been on treatment plans for this.  She states she has never had any episode of gout involving the toes or feet but has been told by her cardiologist this is a problem that she has.   She does not recall ever undergoing bone density screening for osteoporosis. She has a long smoking history but in recent months is either not smoking or very reduced usage most days.   No Rheumatology ROS completed.  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 ([place]) 11/16/2021   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 2 diabetes mellitus ([place]) 11/16/2021   OSA (obstructive sleep apnea) 08/25/2021   Iron deficiency 08/25/2021   Screening for cancer 08/25/2021   Influenza vaccination declined 08/25/2021   Prolonged QT interval 07/11/2021   Stage 3a chronic kidney disease ([place]) 07/11/2021    Septic olecranon bursitis of right elbow 07/11/2021   Essential hypertension, benign 05/04/2021   Hyperlipidemia associated with type 2 diabetes mellitus ([place]) 05/04/2021   Enlarged and hypertrophic nails 05/04/2021   Encounter for screening mammogram for malignant neoplasm of breast 05/04/2021   Need for pneumococcal vaccination 05/04/2021   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[place]) 05/04/2021   Stable angina ([place]) 05/04/2021   Vaccine counseling 05/04/2021   Tobacco use 05/04/2021   High risk medication use 03/29/2021   Screen for colon cancer 01/27/2021   Tophi 01/11/2021   Rheumatoid arthritis involving both hands ([place]) 01/11/2021   Elevated troponin    Diabetes ([place]) 12/19/2020   Constipation 12/19/2020    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   OU progressive, will medically need to have cataract surgery with lens will implantation so as to allow for complete clearance of the vitreous cavity in anticipation of likely vitrectomy in the future for progressive PDR despite previous PRP as delineated by the E DRS series of reports   Diabetes mellitus type 2 with complications ([place])    diagnosed probably 2012 per patient   Diabetic retinopathy ([place])    Hypertension    Rheumatoid arthritis ([place])     Family History  Problem Relation Age of Onset   Diabetes Mother    Hypertension Mother    Heart Problems Sister    Amblyopia Neg Hx    Blindness Neg Hx    Cataracts Neg Hx    Glaucoma Neg Hx    Macular degeneration Neg Hx    Retinal detachment Neg Hx    Strabismus Neg Hx    Retinitis pigmentosa Neg Hx    Past Surgical History:  Procedure Laterality Date   INCISION AND DRAINAGE ABSCESS N/A 07/11/2021   Procedure: INCISION AND DRAINAGE RIGHT ELBOW;  Surgeon: Paralee Cancel, MD;  Location: [facility];  Service: Orthopedics;  Laterality: N/A;   PRP Bilateral OD - 07/23/2017 OS -  08/06/2017   RIGHT/LEFT HEART CATH AND CORONARY ANGIOGRAPHY N/A 01/05/2021   Procedure: RIGHT/LEFT HEART CATH AND CORONARY ANGIOGRAPHY;  Surgeon: Jolaine Artist, MD;  Location: [place];  Service: Cardiovascular;  Laterality: N/A;   TEE WITHOUT CARDIOVERSION N/A 10/09/2021   Procedure: TRANSESOPHAGEAL ECHOCARDIOGRAM (TEE);  Surgeon: Jolaine Artist, MD;  Location: [facility] ENDOSCOPY;  Service: Cardiovascular;  Laterality: N/A;   Social History   Social History Narrative   Not on file   Immunization History  Administered Date(s) Administered   PFIZER(Purple Top)SARS-COV-2 Vaccination 02/09/2020, 03/01/2020, 11/05/2020   Pneumococcal Polysaccharide-23 05/04/2021   Tdap 07/11/2021     Objective: Vital Signs: There were no vitals taken for this visit.   Physical Exam   Musculoskeletal Exam: ***  CDAI Exam: CDAI Score: -- Patient Global: --; Provider Global: -- Swollen: --; Tender: -- Joint Exam 08/14/2022   No  joint exam has been documented for this visit   There is currently no information documented on the homunculus. Go to the Rheumatology activity and complete the homunculus joint exam.  Investigation: No additional findings.  Imaging: No results found.  Recent Labs: Lab Results  Component Value Date   WBC 6.2 07/02/2022   HGB 12.9 07/02/2022   PLT 193 07/02/2022   NA 137 07/02/2022   K 5.3 07/02/2022   CL 108 07/02/2022   CO2 23 07/02/2022   GLUCOSE 129 (H) 07/02/2022   BUN 29 (H) 07/02/2022   CREATININE 1.35 (H) 07/02/2022   BILITOT 0.6 07/02/2022   ALKPHOS 97 07/14/2021   AST 13 07/02/2022   ALT 12 07/02/2022   PROT 7.5 07/02/2022   ALBUMIN 2.8 (L) 07/14/2021   CALCIUM 9.2 07/02/2022   GFRAA >60 11/29/2016    Speciality Comments: No specialty comments available.  Procedures:  No procedures performed Allergies: Metformin and related and Farxiga [dapagliflozin]   Assessment / Plan:     Visit Diagnoses: No diagnosis  found.  ***  Orders: No orders of the defined types were placed in this encounter.  No orders of the defined types were placed in this encounter.    Follow-Up Instructions: No follow-ups on file.   Jairo Ben, RT  Note - This record has been created using [employer].  Chart creation errors have been sought, but may not always  have been located. Such creation errors do not reflect on  the standard of medical care.

## 2022-08-08 ENCOUNTER — Other Ambulatory Visit: Payer: Self-pay | Admitting: *Deleted

## 2022-08-08 MED ORDER — FUROSEMIDE 40 MG PO TABS: 40.0000 mg | ORAL_TABLET | Freq: Every day | ORAL | 3 refills | Status: DC

## 2022-08-14 ENCOUNTER — Ambulatory Visit: Payer: Medicare Other | Admitting: Internal Medicine

## 2022-08-14 DIAGNOSIS — M71121 Other infective bursitis, right elbow: Secondary | ICD-10-CM

## 2022-08-14 DIAGNOSIS — M1A9XX1 Chronic gout, unspecified, with tophus (tophi): Secondary | ICD-10-CM

## 2022-08-14 DIAGNOSIS — M05741 Rheumatoid arthritis with rheumatoid factor of right hand without organ or systems involvement: Secondary | ICD-10-CM

## 2022-08-14 DIAGNOSIS — Z79899 Other long term (current) drug therapy: Secondary | ICD-10-CM

## 2022-08-22 NOTE — Progress Notes (Deleted)
Office Visit Note  Patient: Maria Foster             Date of Birth: 1956-11-08           MRN: [id number]             PCP: Carlena Hurl, PA-C Referring: Carlena Hurl, PA-C Visit Date: 09/03/2022   Subjective:  No chief complaint on file.   History of Present Illness: Maria Foster is a 66 y.o. female here for follow up for seropositive RA   Previous HPI 07/02/2022 Maria Foster is a 66 y.o. 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  She feels like symptoms have made no significant difference on this treatment.  No recurrence of severe flareups like she had in her elbow either.  She does notice a few new and expanding nodules with episodes of spontaneous rupturing and white fluid drainage. Does not have much joint pain or swelling outside of the nodules.   Previous HPI 01/01/2022 Maria Foster is a 66 y.o. female here for follow up for seropositive RA on HCQ 200 mg daily. After our last visit with right elbow injection she developed worsening pain and then purulent drainage found to be MSSA infection of that right olecranon bursa and went to the hospital for surgical debridement of the wound. Wound culture was obtained also surgical report of rheumatoid nodule at the site. She took a course of antibiotics with PICC line completed without incident. She resumed hydroxychloroquine after this and she feels her joint pain is very well controlled at this time. She notices some increase in stiffness and pain with cold and changing weather.   Previous HPI 07/03/21 Maria Foster is a 66 y.o. female here for follow up for seropositive RA and chronic tophaceous gout. Recommended starting hydroxychloroquine treatment 200 mg PO daily for suspected RA as active disease and normal uric levels. She feels her symptoms are improved since starting this, particularly states pain with weather and temperature changes are decreased. She has right elbow pain  more than elsewhere says this hurts mostly due to pressure on the site when lying in bed or certain seated positions.   Previous HPI: 03/29/21 Maria Foster is a 66 y.o. female referred here for evaluation of rheumatoid arthritis.  She has a longstanding history of bilateral hand and knee pain that has been going on for years with some progressive worsening and deformity over time.  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  She also has chronic knee pain and stiffness bilaterally without any large nodular changes.  She does not have much joint pain most days however experiences some episodically and also feels that her hands and knees hurt worse with cold and rainy weather.  She describes a history of rheumatoid arthritis but has never been on treatment plans for this.  She states she has never had any episode of gout involving the toes or feet but has been told by her cardiologist this is a problem that she has.   She does not recall ever undergoing bone density screening for osteoporosis. She has a long smoking history but in recent months is either not smoking or very reduced usage most days.   No Rheumatology ROS completed.   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 ([place]) 11/16/2021   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 2 diabetes mellitus ([place]) 11/16/2021   OSA (obstructive sleep apnea) 08/25/2021   Iron deficiency 08/25/2021   Screening for cancer 08/25/2021   Influenza vaccination declined 08/25/2021   Prolonged QT interval 07/11/2021   Stage 3a chronic kidney disease ([place]) 07/11/2021   Septic  olecranon bursitis of right elbow 07/11/2021   Essential hypertension, benign 05/04/2021   Hyperlipidemia associated with type 2 diabetes mellitus ([place]) 05/04/2021   Enlarged and hypertrophic nails 05/04/2021   Encounter for screening mammogram for malignant neoplasm of breast 05/04/2021   Need for pneumococcal vaccination 05/04/2021   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[place]) 05/04/2021   Stable angina 05/04/2021   Vaccine counseling 05/04/2021   Tobacco use 05/04/2021   High risk medication use 03/29/2021   Screen for colon cancer 01/27/2021   Tophi 01/11/2021   Rheumatoid arthritis involving both hands ([place]) 01/11/2021   Elevated troponin    Diabetes ([place]) 12/19/2020   Constipation 12/19/2020    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   OU progressive, will medically need to have cataract surgery with lens will implantation so as to allow for complete clearance of the vitreous cavity in anticipation of likely vitrectomy in the future for progressive PDR despite previous PRP as delineated by the E DRS series of reports   Diabetes mellitus type 2 with complications ([place])    diagnosed probably 2012 per patient   Diabetic retinopathy ([place])    Hypertension    Rheumatoid arthritis ([place])     Family History  Problem Relation Age of Onset   Diabetes Mother    Hypertension Mother    Heart Problems Sister    Amblyopia Neg Hx    Blindness Neg Hx    Cataracts Neg Hx    Glaucoma Neg Hx    Macular degeneration Neg Hx    Retinal detachment Neg Hx    Strabismus Neg Hx    Retinitis pigmentosa Neg Hx    Past Surgical History:  Procedure Laterality Date   INCISION AND DRAINAGE ABSCESS N/A 07/11/2021   Procedure: INCISION AND DRAINAGE RIGHT ELBOW;  Surgeon: Paralee Cancel, MD;  Location: [facility];  Service: Orthopedics;  Laterality: N/A;   PRP Bilateral OD - 07/23/2017 OS - 08/06/2017    RIGHT/LEFT HEART CATH AND CORONARY ANGIOGRAPHY N/A 01/05/2021   Procedure: RIGHT/LEFT HEART CATH AND CORONARY ANGIOGRAPHY;  Surgeon: Jolaine Artist, MD;  Location: [facility];  Service: Cardiovascular;  Laterality: N/A;   TEE WITHOUT CARDIOVERSION N/A 10/09/2021   Procedure: TRANSESOPHAGEAL ECHOCARDIOGRAM (TEE);  Surgeon: Jolaine Artist, MD;  Location: [facility] ENDOSCOPY;  Service: Cardiovascular;  Laterality: N/A;   Social History   Social History Narrative   Not on file   Immunization History  Administered Date(s) Administered   PFIZER(Purple Top)SARS-COV-2 Vaccination 02/09/2020, 03/01/2020, 11/05/2020   Pneumococcal Polysaccharide-23 05/04/2021   Tdap 07/11/2021     Objective: Vital Signs: There were no vitals taken for this visit.   Physical Exam   Musculoskeletal Exam: ***  CDAI Exam: CDAI Score: -- Patient Global: --; Provider Global: -- Swollen: --; Tender: -- Joint Exam 09/03/2022   No joint exam  has been documented for this visit   There is currently no information documented on the homunculus. Go to the Rheumatology activity and complete the homunculus joint exam.  Investigation: No additional findings.  Imaging: No results found.  Recent Labs: Lab Results  Component Value Date   WBC 6.2 07/02/2022   HGB 12.9 07/02/2022   PLT 193 07/02/2022   NA 137 07/02/2022   K 5.3 07/02/2022   CL 108 07/02/2022   CO2 23 07/02/2022   GLUCOSE 129 (H) 07/02/2022   BUN 29 (H) 07/02/2022   CREATININE 1.35 (H) 07/02/2022   BILITOT 0.6 07/02/2022   ALKPHOS 97 07/14/2021   AST 13 07/02/2022   ALT 12 07/02/2022   PROT 7.5 07/02/2022   ALBUMIN 2.8 (L) 07/14/2021   CALCIUM 9.2 07/02/2022   GFRAA >60 11/29/2016    Speciality Comments: No specialty comments available.  Procedures:  No procedures performed Allergies: Metformin and related and Farxiga [dapagliflozin]   Assessment / Plan:     Visit Diagnoses: No diagnosis found.  ***  Orders: No  orders of the defined types were placed in this encounter.  No orders of the defined types were placed in this encounter.    Follow-Up Instructions: No follow-ups on file.   Bertram Savin, RT  Note - This record has been created using Editor, commissioning.  Chart creation errors have been sought, but may not always  have been located. Such creation errors do not reflect on  the standard of medical care.

## 2022-08-29 ENCOUNTER — Other Ambulatory Visit: Payer: Self-pay | Admitting: Medical

## 2022-08-30 NOTE — Progress Notes (Deleted)
Office Visit Note  Patient: Maria Foster             Date of Birth: 01/25/1956           MRN: [id number]             PCP: Carlena Hurl, PA-C Referring: Carlena Hurl, PA-C Visit Date: 09/05/2022   Subjective:  No chief complaint on file.   History of Present Illness: Maria Foster is a 66 y.o. female here for follow up ***   Previous HPI 07/02/2022  Maria Foster is a 66 y.o. 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  She feels like symptoms have made no significant difference on this treatment.  No recurrence of severe flareups like she had in her elbow either.  She does notice a few new and expanding nodules with episodes of spontaneous rupturing and white fluid drainage. Does not have much joint pain or swelling outside of the nodules.   Previous HPI 01/01/2022 Maria Foster is a 66 y.o. female here for follow up for seropositive RA on HCQ 200 mg daily. After our last visit with right elbow injection she developed worsening pain and then purulent drainage found to be MSSA infection of that right olecranon bursa and went to the hospital for surgical debridement of the wound. Wound culture was obtained also surgical report of rheumatoid nodule at the site. She took a course of antibiotics with PICC line completed without incident. She resumed hydroxychloroquine after this and she feels her joint pain is very well controlled at this time. She notices some increase in stiffness and pain with cold and changing weather.   Previous HPI 07/03/21 Maria Foster is a 66 y.o. female here for follow up for seropositive RA and chronic tophaceous gout. Recommended starting hydroxychloroquine treatment 200 mg PO daily for suspected RA as active disease and normal uric levels. She feels her symptoms are improved since starting this, particularly states pain with weather and temperature changes are decreased. She has right elbow pain more than  elsewhere says this hurts mostly due to pressure on the site when lying in bed or certain seated positions.   Previous HPI: 03/29/21 Maria Foster is a 66 y.o. female referred here for evaluation of rheumatoid arthritis.  She has a longstanding history of bilateral hand and knee pain that has been going on for years with some progressive worsening and deformity over time.  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  She also has chronic knee pain and stiffness bilaterally without any large nodular changes.  She does not have much joint pain most days however experiences some episodically and also feels that her hands and knees hurt worse with cold and rainy weather.  She describes a history of rheumatoid arthritis but has never been on treatment plans for this.  She states she has never had any episode of gout involving the toes or feet but has been told by her cardiologist this is a problem that she has.   She does not recall ever undergoing bone density screening for osteoporosis. She has a long smoking history but in recent months is either not smoking or very reduced usage most days.   No Rheumatology ROS completed.   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 ([place]) 11/16/2021   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 2 diabetes mellitus ([place]) 11/16/2021   OSA (obstructive sleep apnea) 08/25/2021   Iron deficiency 08/25/2021   Screening for cancer 08/25/2021   Influenza vaccination declined 08/25/2021   Prolonged QT interval 07/11/2021   Stage 3a chronic kidney disease ([place]) 07/11/2021   Septic olecranon  bursitis of right elbow 07/11/2021   Essential hypertension, benign 05/04/2021   Hyperlipidemia associated with type 2 diabetes mellitus ([place]) 05/04/2021   Enlarged and hypertrophic nails 05/04/2021   Encounter for screening mammogram for malignant neoplasm of breast 05/04/2021   Need for pneumococcal vaccination 05/04/2021   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[place]) 05/04/2021   Stable angina 05/04/2021   Vaccine counseling 05/04/2021   Tobacco use 05/04/2021   High risk medication use 03/29/2021   Screen for colon cancer 01/27/2021   Tophi 01/11/2021   Rheumatoid arthritis involving both hands ([place]) 01/11/2021   Elevated troponin    Diabetes ([place]) 12/19/2020   Constipation 12/19/2020    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   OU progressive, will medically need to have cataract surgery with lens will implantation so as to allow for complete clearance of the vitreous cavity in anticipation of likely vitrectomy in the future for progressive PDR despite previous PRP as delineated by the E DRS series of reports   Diabetes mellitus type 2 with complications ([place])    diagnosed probably 2012 per patient   Diabetic retinopathy ([place])    Hypertension    Rheumatoid arthritis ([place])     Family History  Problem Relation Age of Onset   Diabetes Mother    Hypertension Mother    Heart Problems Sister    Amblyopia Neg Hx    Blindness Neg Hx    Cataracts Neg Hx    Glaucoma Neg Hx    Macular degeneration Neg Hx    Retinal detachment Neg Hx    Strabismus Neg Hx    Retinitis pigmentosa Neg Hx    Past Surgical History:  Procedure Laterality Date   INCISION AND DRAINAGE ABSCESS N/A 07/11/2021   Procedure: INCISION AND DRAINAGE RIGHT ELBOW;  Surgeon: Paralee Cancel, MD;  Location: [facility];  Service: Orthopedics;  Laterality: N/A;   PRP Bilateral OD - 07/23/2017 OS - 08/06/2017   RIGHT/LEFT HEART  CATH AND CORONARY ANGIOGRAPHY N/A 01/05/2021   Procedure: RIGHT/LEFT HEART CATH AND CORONARY ANGIOGRAPHY;  Surgeon: Jolaine Artist, MD;  Location: [facility];  Service: Cardiovascular;  Laterality: N/A;   TEE WITHOUT CARDIOVERSION N/A 10/09/2021   Procedure: TRANSESOPHAGEAL ECHOCARDIOGRAM (TEE);  Surgeon: Jolaine Artist, MD;  Location: [facility] ENDOSCOPY;  Service: Cardiovascular;  Laterality: N/A;   Social History   Social History Narrative   Not on file   Immunization History  Administered Date(s) Administered   PFIZER(Purple Top)SARS-COV-2 Vaccination 02/09/2020, 03/01/2020, 11/05/2020   Pneumococcal Polysaccharide-23 05/04/2021   Tdap 07/11/2021     Objective: Vital Signs: There were no vitals taken for this visit.   Physical Exam   Musculoskeletal Exam: ***  CDAI Exam: CDAI Score: -- Patient Global: --; Provider Global: -- Swollen: --; Tender: -- Joint Exam 09/05/2022   No joint exam has been  documented for this visit   There is currently no information documented on the homunculus. Go to the Rheumatology activity and complete the homunculus joint exam.  Investigation: No additional findings.  Imaging: No results found.  Recent Labs: Lab Results  Component Value Date   WBC 6.2 07/02/2022   HGB 12.9 07/02/2022   PLT 193 07/02/2022   NA 137 07/02/2022   K 5.3 07/02/2022   CL 108 07/02/2022   CO2 23 07/02/2022   GLUCOSE 129 (H) 07/02/2022   BUN 29 (H) 07/02/2022   CREATININE 1.35 (H) 07/02/2022   BILITOT 0.6 07/02/2022   ALKPHOS 97 07/14/2021   AST 13 07/02/2022   ALT 12 07/02/2022   PROT 7.5 07/02/2022   ALBUMIN 2.8 (L) 07/14/2021   CALCIUM 9.2 07/02/2022   GFRAA >60 11/29/2016    Speciality Comments: No specialty comments available.  Procedures:  No procedures performed Allergies: Metformin and related and Farxiga [dapagliflozin]   Assessment / Plan:     Visit Diagnoses: No diagnosis found.  ***  Orders: No orders of the  defined types were placed in this encounter.  No orders of the defined types were placed in this encounter.    Follow-Up Instructions: No follow-ups on file.   Bertram Savin, RT  Note - This record has been created using Editor, commissioning.  Chart creation errors have been sought, but may not always  have been located. Such creation errors do not reflect on  the standard of medical care.

## 2022-08-30 NOTE — Telephone Encounter (Signed)
Has an appt in [place] 

## 2022-09-01 ENCOUNTER — Other Ambulatory Visit: Payer: Self-pay | Admitting: Internal Medicine

## 2022-09-01 DIAGNOSIS — M069 Rheumatoid arthritis, unspecified: Secondary | ICD-10-CM

## 2022-09-03 ENCOUNTER — Ambulatory Visit: Payer: Medicare Other | Admitting: Internal Medicine

## 2022-09-03 DIAGNOSIS — M05741 Rheumatoid arthritis with rheumatoid factor of right hand without organ or systems involvement: Secondary | ICD-10-CM

## 2022-09-03 DIAGNOSIS — M1A9XX1 Chronic gout, unspecified, with tophus (tophi): Secondary | ICD-10-CM

## 2022-09-03 DIAGNOSIS — Z79899 Other long term (current) drug therapy: Secondary | ICD-10-CM

## 2022-09-03 DIAGNOSIS — M71121 Other infective bursitis, right elbow: Secondary | ICD-10-CM

## 2022-09-03 NOTE — Telephone Encounter (Signed)
Next Visit: 09/05/2022  Last Visit: 07/02/2022  Labs: 07/02/2022 Glucose 129, Creat. 1.35, GFR 43  Eye exam: not on file   Current Dose per office note 07/02/2022:  Continue hydroxychloroquine 200 mg daily   DX: Rheumatoid arthritis involving both hands with positive rheumatoid factor   Last Fill: 03/09/2022  Attempted to contact the patient and left message to advise patient we need her updated PLQ eye exam.   Okay to refill Plaquenil?

## 2022-09-05 ENCOUNTER — Telehealth: Payer: Self-pay | Admitting: Internal Medicine

## 2022-09-05 ENCOUNTER — Ambulatory Visit: Payer: Medicare Other | Admitting: Internal Medicine

## 2022-09-05 DIAGNOSIS — M05741 Rheumatoid arthritis with rheumatoid factor of right hand without organ or systems involvement: Secondary | ICD-10-CM

## 2022-09-05 DIAGNOSIS — M1A9XX1 Chronic gout, unspecified, with tophus (tophi): Secondary | ICD-10-CM

## 2022-09-05 DIAGNOSIS — Z79899 Other long term (current) drug therapy: Secondary | ICD-10-CM

## 2022-09-05 DIAGNOSIS — M71121 Other infective bursitis, right elbow: Secondary | ICD-10-CM

## 2022-09-05 NOTE — Telephone Encounter (Signed)
Should prescription of MTX and Folic Acid be sent to the pharmacy? When should patient follow up in office?

## 2022-09-05 NOTE — Telephone Encounter (Signed)
Patient called the office to reschedule appointment that we cancelled due to provider being out of office. Patient mentioned how her appointment was a medication follow up for MTX but never received a call from her pharmacy to pick up any medication. Patient unsure if she still needs to be seen since she never started any new medication. Please advise.

## 2022-09-06 NOTE — Telephone Encounter (Signed)
Spoke with patient and advised Dr. Benjamine Mola did not send in the MTX due to her kidney function on her most recent labs. Patient advised Dr. Benjamine Mola would like to schedule a follow up visit to discuss alternative treatment options. Patient scheduled for 09/26/2022 at 2:20 pm.

## 2022-09-06 NOTE — Telephone Encounter (Signed)
No, we discussed starting methotrexate but her kidney function

## 2022-09-11 ENCOUNTER — Telehealth: Payer: Self-pay | Admitting: Family Medicine

## 2022-09-11 NOTE — Telephone Encounter (Signed)
Called pharmacy t/w pharm he states rx will be ready this afternoon.  Advised pt of same.  Tradjenta med.

## 2022-09-13 ENCOUNTER — Encounter (INDEPENDENT_AMBULATORY_CARE_PROVIDER_SITE_OTHER): Payer: Medicare Other | Admitting: Ophthalmology

## 2022-09-13 NOTE — Progress Notes (Signed)
Office Visit Note  Patient: Maria Foster             Date of Birth: 1956/04/24           MRN: [id number]             PCP: Carlena Hurl, PA-C Referring: Carlena Hurl, PA-C Visit Date: 09/26/2022   Subjective:  Medication Management (Discuss medications )   History of Present Illness: Maria Foster is a 66 y.o. female here for follow up for seropositive RA we did not start methotrexate as planned due to renal impairment worse than baseline checked at clinic appointment.  Symptoms remain about the same and also not seeing any worsening with stopping hydroxychloroquine.  Previous HPI 07/02/2022 Maria Foster is a 66 y.o. 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  She feels like symptoms have made no significant difference on this treatment.  No recurrence of severe flareups like she had in her elbow either.  She does notice a few new and expanding nodules with episodes of spontaneous rupturing and white fluid drainage. Does not have much joint pain or swelling outside of the nodules.   Previous HPI 01/01/2022 Maria Foster is a 66 y.o. female here for follow up for seropositive RA on HCQ 200 mg daily. After our last visit with right elbow injection she developed worsening pain and then purulent drainage found to be MSSA infection of that right olecranon bursa and went to the hospital for surgical debridement of the wound. Wound culture was obtained also surgical report of rheumatoid nodule at the site. She took a course of antibiotics with PICC line completed without incident. She resumed hydroxychloroquine after this and she feels her joint pain is very well controlled at this time. She notices some increase in stiffness and pain with cold and changing weather.   Previous HPI 07/03/21 Maria Foster is a 66 y.o. female here for follow up for seropositive RA and chronic tophaceous gout. Recommended starting hydroxychloroquine treatment 200  mg PO daily for suspected RA as active disease and normal uric levels. She feels her symptoms are improved since starting this, particularly states pain with weather and temperature changes are decreased. She has right elbow pain more than elsewhere says this hurts mostly due to pressure on the site when lying in bed or certain seated positions.   Previous HPI: 03/29/21 Maria Foster is a 66 y.o. female referred here for evaluation of rheumatoid arthritis.  She has a longstanding history of bilateral hand and knee pain that has been going on for years with some progressive worsening and deformity over time.  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  She also has chronic knee pain and stiffness bilaterally without any large nodular changes.  She does not have much joint pain most days however experiences some episodically and also feels that her hands and knees hurt worse with cold and rainy weather.  She describes a history of rheumatoid arthritis but has never been on treatment plans for this.  She states she has never had any episode of gout involving the toes or feet but has been told by her cardiologist this is a problem that she has.   She does not recall ever undergoing bone density  screening for osteoporosis. She has a long smoking history but in recent months is either not smoking or very reduced usage most days.   Review of Systems  Constitutional:  Negative for fatigue.  HENT:  Negative for mouth sores and mouth dryness.   Eyes:  Positive for dryness.  Respiratory:  Negative for shortness of breath.   Cardiovascular:  Negative for chest pain and palpitations.  Gastrointestinal:  Negative for blood in stool, constipation and diarrhea.  Endocrine: Negative for increased urination.  Genitourinary:   Negative for involuntary urination.  Musculoskeletal:  Positive for joint swelling and morning stiffness. Negative for joint pain, gait problem, joint pain, myalgias, muscle weakness, muscle tenderness and myalgias.  Skin:  Negative for color change, rash, hair loss and sensitivity to sunlight.  Allergic/Immunologic: Positive for susceptible to infections.  Neurological:  Negative for dizziness and headaches.  Hematological:  Negative for swollen glands.  Psychiatric/Behavioral:  Negative for depressed mood and sleep disturbance. The patient is not nervous/anxious.     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 ([place]) 11/16/2021   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 2 diabetes mellitus ([place]) 11/16/2021   OSA (obstructive sleep apnea) 08/25/2021   Iron deficiency 08/25/2021   Screening for cancer 08/25/2021   Influenza vaccination declined 08/25/2021   Prolonged QT interval 07/11/2021   Stage 3a chronic kidney disease ([place]) 07/11/2021   Septic olecranon bursitis of right elbow 07/11/2021   Essential hypertension, benign 05/04/2021   Hyperlipidemia associated with type 2 diabetes mellitus ([place]) 05/04/2021   Enlarged and hypertrophic nails 05/04/2021   Encounter for screening mammogram for malignant neoplasm of breast 05/04/2021   Need for pneumococcal vaccination 05/04/2021   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[place]) 05/04/2021   Stable angina 05/04/2021   Vaccine counseling 05/04/2021   Tobacco use 05/04/2021   High risk medication use 03/29/2021   Screen for colon cancer 01/27/2021   Tophi 01/11/2021   Rheumatoid arthritis involving both hands ([place]) 01/11/2021   Elevated troponin    Diabetes ([place]) 12/19/2020   Constipation 12/19/2020    Past Medical  History:  Diagnosis Date   Arthritis    Cataract    Cataract, nuclear sclerotic, both eyes 11/16/2021   OU progressive, will medically need to have cataract surgery with lens will implantation so as to allow for complete clearance of the vitreous cavity in anticipation of likely vitrectomy in the future for progressive PDR despite previous PRP as delineated by the E DRS series of reports   Diabetes mellitus type 2 with complications ([place])    diagnosed probably 2012 per patient   Diabetic retinopathy ([place])    Hypertension    Rheumatoid arthritis ([place])     Family History  Problem Relation Age of Onset   Diabetes Mother    Hypertension Mother    Heart Problems Sister    Amblyopia Neg Hx    Blindness Neg Hx    Cataracts Neg Hx    Glaucoma Neg Hx    Macular degeneration Neg Hx    Retinal detachment Neg Hx    Strabismus Neg Hx    Retinitis pigmentosa Neg Hx    Past Surgical History:  Procedure Laterality Date   INCISION AND DRAINAGE ABSCESS N/A 07/11/2021   Procedure: INCISION AND DRAINAGE RIGHT ELBOW;  Surgeon: Paralee Cancel,  MD;  Location: [facility];  Service: Orthopedics;  Laterality: N/A;   PRP Bilateral OD - 07/23/2017 OS - 08/06/2017   RIGHT/LEFT HEART CATH AND CORONARY ANGIOGRAPHY N/A 01/05/2021   Procedure: RIGHT/LEFT HEART CATH AND CORONARY ANGIOGRAPHY;  Surgeon: Jolaine Artist, MD;  Location: [facility];  Service: Cardiovascular;  Laterality: N/A;   TEE WITHOUT CARDIOVERSION N/A 10/09/2021   Procedure: TRANSESOPHAGEAL ECHOCARDIOGRAM (TEE);  Surgeon: Jolaine Artist, MD;  Location: [facility];  Service: Cardiovascular;  Laterality: N/A;   Social History   Social History Narrative   Not on file   Immunization History  Administered Date(s) Administered   PFIZER(Purple Top)SARS-COV-2 Vaccination 02/09/2020, 03/01/2020, 11/05/2020   Pneumococcal Polysaccharide-23 05/04/2021   Tdap 07/11/2021     Objective: Vital Signs: BP (!) 202/80 (BP Location: Right  Arm, Patient Position: Sitting, Cuff Size: Normal)   Pulse 61   Resp 15   Ht 5\' 3"  (1.6 m)   Wt 145 lb (65.8 kg)   BMI 25.69 kg/m    Physical Exam Cardiovascular:     Rate and Rhythm: Normal rate and regular rhythm.  Pulmonary:     Effort: Pulmonary effort is normal.     Breath sounds: Normal breath sounds.  Musculoskeletal:     Right lower leg: No edema.     Left lower leg: No edema.  Skin:    General: Skin is warm and dry.  Psychiatric:        Mood and Affect: Mood normal.      Musculoskeletal Exam:  Shoulders full ROM no tenderness or swelling Elbows, wrists, and finger joints with full ROM no tenderness or palpable synovitis, bulky nodules overlying elbows and forearm extensor surfaces, over dorsum of MCPs and smaller nodules throughout distal fingers Knees full ROM no tenderness or swelling Ankles full ROM no tenderness or swelling  Investigation: No additional findings.  Imaging: No results found.  Recent Labs: Lab Results  Component Value Date   WBC 6.2 07/02/2022   HGB 12.9 07/02/2022   PLT 193 07/02/2022   NA 136 09/26/2022   K 4.3 09/26/2022   CL 105 09/26/2022   CO2 24 09/26/2022   GLUCOSE 228 (H) 09/26/2022   BUN 16 09/26/2022   CREATININE 0.94 09/26/2022   BILITOT 0.7 09/26/2022   ALKPHOS 97 07/14/2021   AST 9 (L) 09/26/2022   ALT 6 09/26/2022   PROT 7.3 09/26/2022   ALBUMIN 2.8 (L) 07/14/2021   CALCIUM 9.1 09/26/2022   GFRAA >60 11/29/2016    Speciality Comments: No specialty comments available.  Procedures:  No procedures performed Allergies: Metformin and related and Farxiga [dapagliflozin]   Assessment / Plan:     Visit Diagnoses: Rheumatoid arthritis involving both hands with positive rheumatoid factor ([place]) - Plan: Sedimentation rate, leflunomide (ARAVA) 20 MG tablet  Extensive nodular disease most consistent and clinical course and from intraoperative inspection at the septic bursitis drainage for rheumatoid nodule disease.   Rechecking sedimentation rate for disease activity assessment.  Plan to start leflunomide 20 mg daily.  High risk medication use   Planning to start leflunomide treatment today.  Discussed risks of medications including GI intolerance, hypertension, infections, cytopenias, hepatotoxicity.  Safer option than methotrexate for borderline renal status.  Will recheck complete metabolic panel today.  Septic olecranon bursitis of right elbow  No significant recurrence of pain and swelling around the right elbow.  Does have overlying soft tissue nodule.  Tophi - Plan: Uric acid  Will recheck uric acid level monitoring  due to suspicion for possible chronic tophaceous gout but has been at goal off medications and symptoms less consistent with episodic inflammatory arthritis.  Orders: Orders Placed This Encounter  Procedures   COMPLETE METABOLIC PANEL WITH GFR   Sedimentation rate   Uric acid   Meds ordered this encounter  Medications   leflunomide (ARAVA) 20 MG tablet    Sig: Take 1 tablet (20 mg total) by mouth daily.    Dispense:  30 tablet    Refill:  2     Follow-Up Instructions: Return in about 2 months (around 11/26/2022) for RA LEF start f/u 21mos.   Collier Salina, MD  Note - This record has been created using [employer].  Chart creation errors have been sought, but may not always  have been located. Such creation errors do not reflect on  the standard of medical care.

## 2022-09-23 ENCOUNTER — Other Ambulatory Visit: Payer: Self-pay | Admitting: Medical

## 2022-09-26 ENCOUNTER — Encounter: Payer: Self-pay | Admitting: Internal Medicine

## 2022-09-26 ENCOUNTER — Ambulatory Visit: Payer: Medicare Other | Attending: Internal Medicine | Admitting: Internal Medicine

## 2022-09-26 VITALS — BP 202/80 | HR 61 | Resp 15 | Ht 63.0 in | Wt 145.0 lb

## 2022-09-26 DIAGNOSIS — M71121 Other infective bursitis, right elbow: Secondary | ICD-10-CM | POA: Diagnosis not present

## 2022-09-26 DIAGNOSIS — M1A9XX1 Chronic gout, unspecified, with tophus (tophi): Secondary | ICD-10-CM | POA: Diagnosis not present

## 2022-09-26 DIAGNOSIS — M05741 Rheumatoid arthritis with rheumatoid factor of right hand without organ or systems involvement: Secondary | ICD-10-CM | POA: Diagnosis not present

## 2022-09-26 DIAGNOSIS — Z79899 Other long term (current) drug therapy: Secondary | ICD-10-CM | POA: Diagnosis not present

## 2022-09-26 DIAGNOSIS — M05742 Rheumatoid arthritis with rheumatoid factor of left hand without organ or systems involvement: Secondary | ICD-10-CM

## 2022-09-26 LAB — COMPLETE METABOLIC PANEL WITH GFR
AG Ratio: 1.1 (calc) (ref 1.0–2.5)
ALT: 6 U/L (ref 6–29)
AST: 9 U/L — ABNORMAL LOW (ref 10–35)
Albumin: 3.8 g/dL (ref 3.6–5.1)
Alkaline phosphatase (APISO): 110 U/L (ref 37–153)
BUN: 16 mg/dL (ref 7–25)
CO2: 24 mmol/L (ref 20–32)
Calcium: 9.1 mg/dL (ref 8.6–10.4)
Chloride: 105 mmol/L (ref 98–110)
Creat: 0.94 mg/dL (ref 0.50–1.05)
Globulin: 3.5 g/dL (calc) (ref 1.9–3.7)
Glucose, Bld: 228 mg/dL — ABNORMAL HIGH (ref 65–99)
Potassium: 4.3 mmol/L (ref 3.5–5.3)
Sodium: 136 mmol/L (ref 135–146)
Total Bilirubin: 0.7 mg/dL (ref 0.2–1.2)
Total Protein: 7.3 g/dL (ref 6.1–8.1)
eGFR: 67 mL/min/{1.73_m2} (ref >=60)

## 2022-09-26 LAB — SEDIMENTATION RATE: Sed Rate: 58 mm/h — ABNORMAL HIGH (ref 0–30)

## 2022-09-26 LAB — URIC ACID: Uric Acid, Serum: 5 mg/dL (ref 2.5–7.0)

## 2022-09-26 MED ORDER — LEFLUNOMIDE 20 MG PO TABS: 20.0000 mg | ORAL_TABLET | Freq: Every day | ORAL | 2 refills | Status: DC

## 2022-09-26 NOTE — Patient Instructions (Signed)
Leflunomide Tablets What is this medication? LEFLUNOMIDE (le FLOO na mide) treats the symptoms of rheumatoid arthritis. It works by slowing down an overactive immune system. This decreases inflammation. It belongs to a group of medications called DMARDs. This medicine may be used for other purposes; ask your health care provider or pharmacist if you have questions. COMMON BRAND NAME(S): Arava What should I tell my care team before I take this medication? They need to know if you have any of these conditions: Cancer Diabetes High blood pressure Immune system problems Infection Kidney disease Liver disease Low blood cell levels (white cells, red cells, and platelets) Lung or breathing disease, such as asthma or COPD Recent or upcoming vaccine Skin conditions Tingling of the fingers or toes, or other nerve disorder An unusual or allergic reaction to leflunomide, other medications, food, dyes, or preservatives Pregnant or trying to get pregnant Breastfeeding How should I use this medication? Take this medication by mouth with a full glass of water. Take it as directed on the prescription label at the same time every day. Keep taking it unless your care team tells you to stop. Talk to your care team about the use of this medication in children. Special care may be needed. Overdosage: If you think you have taken too much of this medicine contact a poison control center or emergency room at once. NOTE: This medicine is only for you. Do not share this medicine with others. What if I miss a dose? If you miss a dose, take it as soon as you can. If it is almost time for your next dose, take only that dose. Do not take double or extra doses. What may interact with this medication? Do not take this medication with any of the following: Teriflunomide This medication may also interact with the following: Alosetron Caffeine Cefaclor Certain medications for diabetes, such as nateglinide,  repaglinide, rosiglitazone, pioglitazone Certain medications for high cholesterol, such as atorvastatin, pravastatin, rosuvastatin, simvastatin Charcoal Cholestyramine Ciprofloxacin Duloxetine Estrogen and progestin hormones Furosemide Ketoprofen Live virus vaccines Medications that increase your risk for infection Methotrexate Mitoxantrone Paclitaxel Penicillin Theophylline Tizanidine Warfarin This list may not describe all possible interactions. Give your health care provider a list of all the medicines, herbs, non-prescription drugs, or dietary supplements you use. Also tell them if you smoke, drink alcohol, or use illegal drugs. Some items may interact with your medicine. What should I watch for while using this medication? Visit your care team for regular checks on your progress. Tell your care team if your symptoms do not start to get better or if they get worse. You may need blood work done while you are taking this medication. This medication may cause serious skin reactions. They can happen weeks to months after starting the medication. Contact your care team right away if you notice fevers or flu-like symptoms with a rash. The rash may be red or purple and then turn into blisters or peeling of the skin. You may also notice a red rash with swelling of the face, lips, or lymph nodes in your neck or under your arms. You should not receive certain vaccines during your treatment and for a certain time after your treatment with this medication ends. Talk to your care team for more information. This medication may stay in your body for up to 2 years after your last dose. Tell your care team about any unusual side effects or symptoms. A medication can be given to help lower your blood levels of this   medication more quickly. Talk to your care team if you may be pregnant. This medication can cause serious birth defects if taken during pregnancy and for a while after the last dose. You will  need a negative pregnancy test before starting this medication. Contraception is recommended while taking this medication and for a while after the last dose. Your care team can help you find the option that works for you. Do not breastfeed while taking this medication. What side effects may I notice from receiving this medication? Side effects that you should report to your care team as soon as possible: Allergic reactions--skin rash, itching, hives, swelling of the face, lips, tongue, or throat Dry cough, shortness of breath or trouble breathing Increase in blood pressure Infection--fever, chills, cough, sore throat, wounds that don't heal, pain or trouble when passing urine, general feeling of discomfort or being unwell Redness, blistering, peeling, or loosening of the skin, including inside the mouth Liver injury--right upper belly pain, loss of appetite, nausea, light-colored stool, dark yellow or brown urine, yellowing skin or eyes, unusual weakness or fatigue Pain, tingling, or numbness in the hands or feet Unusual bruising or bleeding Side effects that usually do not require medical attention (report to your care team if they continue or are bothersome): Back pain Diarrhea Hair loss Headache Nausea This list may not describe all possible side effects. Call your doctor for medical advice about side effects. You may report side effects to FDA at 1-800-FDA-1088. Where should I keep my medication? Keep out of the reach of children and pets. Store at room temperature between 20 and 25 degrees C (68 and 77 degrees F). Protect from moisture and light. Keep the container tightly closed. Get rid of any unused medication after the expiration date. To get rid of medications that are no longer needed or have expired: Take the medication to a medication take-back program. Check with your pharmacy or law enforcement to find a location. If you cannot return the medication, ask your pharmacist or  care team how to get rid of this medication safely. NOTE: This sheet is a summary. It may not cover all possible information. If you have questions about this medicine, talk to your doctor, pharmacist, or health care provider.  2023 Elsevier/Gold Standard (2007-12-27 00:00:00)  

## 2022-10-08 LAB — HM DIABETES EYE EXAM

## 2022-10-09 ENCOUNTER — Encounter: Payer: Self-pay | Admitting: Internal Medicine

## 2022-10-24 ENCOUNTER — Other Ambulatory Visit: Payer: Self-pay | Admitting: Internal Medicine

## 2022-10-29 ENCOUNTER — Encounter: Payer: Self-pay | Admitting: Nurse Practitioner

## 2022-10-29 ENCOUNTER — Emergency Department (HOSPITAL_BASED_OUTPATIENT_CLINIC_OR_DEPARTMENT_OTHER): Payer: Medicare Other

## 2022-10-29 ENCOUNTER — Encounter (HOSPITAL_BASED_OUTPATIENT_CLINIC_OR_DEPARTMENT_OTHER): Payer: Self-pay | Admitting: Emergency Medicine

## 2022-10-29 ENCOUNTER — Telehealth (INDEPENDENT_AMBULATORY_CARE_PROVIDER_SITE_OTHER): Payer: Medicare Other | Admitting: Nurse Practitioner

## 2022-10-29 ENCOUNTER — Other Ambulatory Visit: Payer: Self-pay

## 2022-10-29 ENCOUNTER — Inpatient Hospital Stay (HOSPITAL_BASED_OUTPATIENT_CLINIC_OR_DEPARTMENT_OTHER)
Admission: EM | Admit: 2022-10-29 | Discharge: 2022-11-01 | DRG: 872 | Disposition: A | Discharge status: Home / Self Care | Payer: Medicare Other | Attending: Internal Medicine | Admitting: Internal Medicine

## 2022-10-29 VITALS — Ht 63.0 in | Wt 145.0 lb

## 2022-10-29 DIAGNOSIS — J111 Influenza due to unidentified influenza virus with other respiratory manifestations: Secondary | ICD-10-CM | POA: Insufficient documentation

## 2022-10-29 DIAGNOSIS — N3001 Acute cystitis with hematuria: Secondary | ICD-10-CM

## 2022-10-29 DIAGNOSIS — Z7982 Long term (current) use of aspirin: Secondary | ICD-10-CM

## 2022-10-29 DIAGNOSIS — N1831 Chronic kidney disease, stage 3a: Secondary | ICD-10-CM | POA: Diagnosis present

## 2022-10-29 DIAGNOSIS — R059 Cough, unspecified: Secondary | ICD-10-CM | POA: Insufficient documentation

## 2022-10-29 DIAGNOSIS — A419 Sepsis, unspecified organism: Secondary | ICD-10-CM | POA: Diagnosis not present

## 2022-10-29 DIAGNOSIS — I2510 Atherosclerotic heart disease of native coronary artery without angina pectoris: Secondary | ICD-10-CM | POA: Diagnosis present

## 2022-10-29 DIAGNOSIS — E785 Hyperlipidemia, unspecified: Secondary | ICD-10-CM | POA: Diagnosis present

## 2022-10-29 DIAGNOSIS — Z888 Allergy status to other drugs, medicaments and biological substances status: Secondary | ICD-10-CM

## 2022-10-29 DIAGNOSIS — R9431 Abnormal electrocardiogram [ECG] [EKG]: Secondary | ICD-10-CM | POA: Diagnosis present

## 2022-10-29 DIAGNOSIS — I509 Heart failure, unspecified: Secondary | ICD-10-CM

## 2022-10-29 DIAGNOSIS — E872 Acidosis, unspecified: Secondary | ICD-10-CM | POA: Diagnosis present

## 2022-10-29 DIAGNOSIS — Z8249 Family history of ischemic heart disease and other diseases of the circulatory system: Secondary | ICD-10-CM

## 2022-10-29 DIAGNOSIS — F17210 Nicotine dependence, cigarettes, uncomplicated: Secondary | ICD-10-CM | POA: Diagnosis present

## 2022-10-29 DIAGNOSIS — N3000 Acute cystitis without hematuria: Secondary | ICD-10-CM | POA: Diagnosis present

## 2022-10-29 DIAGNOSIS — E119 Type 2 diabetes mellitus without complications: Secondary | ICD-10-CM

## 2022-10-29 DIAGNOSIS — Z7984 Long term (current) use of oral hypoglycemic drugs: Secondary | ICD-10-CM

## 2022-10-29 DIAGNOSIS — K6289 Other specified diseases of anus and rectum: Secondary | ICD-10-CM | POA: Diagnosis present

## 2022-10-29 DIAGNOSIS — Z833 Family history of diabetes mellitus: Secondary | ICD-10-CM

## 2022-10-29 DIAGNOSIS — Z79899 Other long term (current) drug therapy: Secondary | ICD-10-CM

## 2022-10-29 DIAGNOSIS — E876 Hypokalemia: Secondary | ICD-10-CM | POA: Insufficient documentation

## 2022-10-29 DIAGNOSIS — I160 Hypertensive urgency: Secondary | ICD-10-CM | POA: Insufficient documentation

## 2022-10-29 DIAGNOSIS — E11319 Type 2 diabetes mellitus with unspecified diabetic retinopathy without macular edema: Secondary | ICD-10-CM | POA: Diagnosis present

## 2022-10-29 DIAGNOSIS — M069 Rheumatoid arthritis, unspecified: Secondary | ICD-10-CM | POA: Diagnosis present

## 2022-10-29 DIAGNOSIS — Z1152 Encounter for screening for COVID-19: Secondary | ICD-10-CM

## 2022-10-29 DIAGNOSIS — N3090 Cystitis, unspecified without hematuria: Secondary | ICD-10-CM | POA: Diagnosis present

## 2022-10-29 DIAGNOSIS — I130 Hypertensive heart and chronic kidney disease with heart failure and stage 1 through stage 4 chronic kidney disease, or unspecified chronic kidney disease: Secondary | ICD-10-CM | POA: Diagnosis present

## 2022-10-29 DIAGNOSIS — I5042 Chronic combined systolic (congestive) and diastolic (congestive) heart failure: Secondary | ICD-10-CM | POA: Diagnosis present

## 2022-10-29 DIAGNOSIS — E1122 Type 2 diabetes mellitus with diabetic chronic kidney disease: Secondary | ICD-10-CM | POA: Diagnosis present

## 2022-10-29 LAB — CBC
HCT: 45.6 % (ref 36.0–46.0)
Hemoglobin: 14.6 g/dL (ref 12.0–15.0)
MCH: 28.8 pg (ref 26.0–34.0)
MCHC: 32 g/dL (ref 30.0–36.0)
MCV: 89.9 fL (ref 80.0–100.0)
Platelets: 172 10*3/uL (ref 150–400)
RBC: 5.07 MIL/uL (ref 3.87–5.11)
RDW: 12.8 % (ref 11.5–15.5)
WBC: 9.9 10*3/uL (ref 4.0–10.5)
nRBC: 0 % (ref 0.0–0.2)

## 2022-10-29 LAB — URINALYSIS, ROUTINE W REFLEX MICROSCOPIC
Bilirubin Urine: NEGATIVE
Glucose, UA: >1000 mg/dL — AB
Ketones, ur: 40 mg/dL — AB
Leukocytes,Ua: NEGATIVE
Nitrite: POSITIVE — AB
Protein, ur: 30 mg/dL — AB
Specific Gravity, Urine: 1.044 — ABNORMAL HIGH (ref 1.005–1.030)
pH: 6 (ref 5.0–8.0)

## 2022-10-29 LAB — COMPREHENSIVE METABOLIC PANEL
ALT: 12 U/L (ref 0–44)
AST: 14 U/L — ABNORMAL LOW (ref 15–41)
Albumin: 4.7 g/dL (ref 3.5–5.0)
Alkaline Phosphatase: 114 U/L (ref 38–126)
Anion gap: 18 — ABNORMAL HIGH (ref 5–15)
BUN: 14 mg/dL (ref 8–23)
CO2: 23 mmol/L (ref 22–32)
Calcium: 10 mg/dL (ref 8.9–10.3)
Chloride: 95 mmol/L — ABNORMAL LOW (ref 98–111)
Creatinine, Ser: 0.85 mg/dL (ref 0.44–1.00)
GFR, Estimated: >60 mL/min (ref >60)
Glucose, Bld: 276 mg/dL — ABNORMAL HIGH (ref 70–99)
Potassium: 3.4 mmol/L — ABNORMAL LOW (ref 3.5–5.1)
Sodium: 136 mmol/L (ref 135–145)
Total Bilirubin: 0.9 mg/dL (ref 0.3–1.2)
Total Protein: 9.5 g/dL — ABNORMAL HIGH (ref 6.5–8.1)

## 2022-10-29 LAB — RESP PANEL BY RT-PCR (RSV, FLU A&B, COVID)  RVPGX2
Influenza A by PCR: NEGATIVE
Influenza B by PCR: NEGATIVE
Resp Syncytial Virus by PCR: NEGATIVE
SARS Coronavirus 2 by RT PCR: NEGATIVE

## 2022-10-29 LAB — LIPASE, BLOOD: Lipase: 11 U/L (ref 11–51)

## 2022-10-29 LAB — LACTIC ACID, PLASMA
Lactic Acid, Venous: 2.6 mmol/L (ref 0.5–1.9)
Lactic Acid, Venous: 2.8 mmol/L (ref 0.5–1.9)

## 2022-10-29 MED ORDER — METOCLOPRAMIDE HCL 5 MG/ML IJ SOLN
10.0000 mg | Freq: Once | INTRAMUSCULAR | Status: AC
  Administered 2022-10-29: 10 mg via INTRAVENOUS
  Filled 2022-10-29: qty 2

## 2022-10-29 MED ORDER — LABETALOL HCL 5 MG/ML IV SOLN
10.0000 mg | Freq: Once | INTRAVENOUS | Status: AC
  Administered 2022-10-29: 10 mg via INTRAVENOUS
  Filled 2022-10-29: qty 4

## 2022-10-29 MED ORDER — HYDROMORPHONE HCL 1 MG/ML IJ SOLN
1.0000 mg | Freq: Once | INTRAMUSCULAR | Status: AC
  Administered 2022-10-29: 1 mg via INTRAVENOUS
  Filled 2022-10-29: qty 1

## 2022-10-29 MED ORDER — FENTANYL CITRATE PF 50 MCG/ML IJ SOSY
50.0000 ug | PREFILLED_SYRINGE | Freq: Once | INTRAMUSCULAR | Status: AC
  Administered 2022-10-29: 50 ug via INTRAVENOUS
  Filled 2022-10-29: qty 1

## 2022-10-29 MED ORDER — LORAZEPAM 2 MG/ML IJ SOLN
1.0000 mg | Freq: Once | INTRAMUSCULAR | Status: AC
  Administered 2022-10-29: 1 mg via INTRAVENOUS
  Filled 2022-10-29 (×2): qty 1

## 2022-10-29 MED ORDER — IOHEXOL 300 MG/ML  SOLN
100.0000 mL | Freq: Once | INTRAMUSCULAR | Status: AC | PRN
  Administered 2022-10-29: 80 mL via INTRAVENOUS

## 2022-10-29 MED ORDER — SODIUM CHLORIDE 0.9 % IV BOLUS
500.0000 mL | Freq: Once | INTRAVENOUS | Status: AC
  Administered 2022-10-29: 500 mL via INTRAVENOUS

## 2022-10-29 MED ORDER — HYDRALAZINE HCL 25 MG PO TABS
50.0000 mg | ORAL_TABLET | Freq: Once | ORAL | Status: AC
  Administered 2022-10-29: 50 mg via ORAL
  Filled 2022-10-29: qty 2

## 2022-10-29 MED ORDER — SODIUM CHLORIDE 0.9 % IV BOLUS
1000.0000 mL | Freq: Once | INTRAVENOUS | Status: AC
  Administered 2022-10-29: 1000 mL via INTRAVENOUS

## 2022-10-29 MED ORDER — SODIUM CHLORIDE 0.9 % IV BOLUS: 1000.0000 mL | Freq: Once | INTRAVENOUS | Status: DC

## 2022-10-29 MED ORDER — ONDANSETRON HCL 4 MG PO TABS: 4.0000 mg | ORAL_TABLET | Freq: Three times a day (TID) | ORAL | 0 refills | Status: DC | PRN

## 2022-10-29 MED ORDER — SODIUM CHLORIDE 0.9 % IV SOLN
2.0000 g | Freq: Once | INTRAVENOUS | Status: AC
  Administered 2022-10-29: 2 g via INTRAVENOUS
  Filled 2022-10-29: qty 20

## 2022-10-29 MED ORDER — OSELTAMIVIR PHOSPHATE 75 MG PO CAPS: 75.0000 mg | ORAL_CAPSULE | Freq: Two times a day (BID) | ORAL | 0 refills | Status: DC

## 2022-10-29 MED ORDER — POTASSIUM CHLORIDE CRYS ER 20 MEQ PO TBCR
40.0000 meq | EXTENDED_RELEASE_TABLET | Freq: Once | ORAL | Status: AC
  Administered 2022-10-30: 40 meq via ORAL
  Filled 2022-10-29: qty 2

## 2022-10-29 NOTE — ED Notes (Signed)
Pt requesting to eat - PA-C Delorise Shiner advising pt ok to eat.  Pt subsequently provided cup of chicken broth, cup of beef broth, sugar free jello and applesauce - pt reports she will try chicken broth.  Pt also questioning at this time why she has suppl O2 -- no h/o home O2 - pt now being weaned off nasal cannula; O2 sats on RA 96%

## 2022-10-29 NOTE — ED Notes (Signed)
Provider to bedside at this time - updated plan of care to admit patient d/t increasing lactic acid.

## 2022-10-29 NOTE — ED Notes (Signed)
This nurse has assumed care- entered room to find pt awake sitting up in bed with family at bedside.  No acute distress noted.  RR even and unlabored on suppl O2 via [place].  Continuous cardiac and pulse ox maintained-- pt remains hypertensive; PA-C C. Delorise Shiner made aware via secure chat and has subsequently ordered another IVP dose of Labetalol.  Pt had reported current abd pain level 6-7/10; improved since arrival with mild nausea- denies need for pain and nausea med at this time.  Dilaudid 1mg  IVP on hold if patient needs.  Pt updated on plan of care - to be d/c'd home pending improving lactic acid

## 2022-10-29 NOTE — Progress Notes (Signed)
Virtual Visit Encounter mychart visit.   I connected with  Maria Foster on 10/29/22 at  8:15 AM EST by secure video and audio telemedicine application. I verified that I am speaking with the correct person using two identifiers.   I introduced myself as a Publishing rights manager with the practice. The limitations of evaluation and management by telemedicine discussed with the patient and the availability of in person appointments. The patient expressed verbal understanding and consent to proceed.  Participating parties in this visit include: Myself and patient  The patient is: Patient Location: Home I am: Provider Location: Office/Clinic Subjective:    CC and HPI: Maria Foster is a 66 y.o. year old female presenting for new evaluation and treatment of flu. Patient reports the following: Flu-like symptoms - Maria Foster reports keeping her grandchild last week and shortly thereafter her grandchild became ill with influenza like symptoms. She then started to feel bad late Friday/Maria Foster Saturday with symptoms of body aches, cough, congestion, fever, and vomiting. These symptoms progressively worsened over the weekend. Today she tells me that she feels "awful". She has taken a COVID test and this was negative. She is able to hold down water, but has not been able to hold down broth or solid foods. She hasn't been taking anything for her symptoms. She has not had the flu vaccine.   Past medical history, Surgical history, Family history not pertinant except as noted below, Social history, Allergies, and medications have been entered into the medical record, reviewed, and corrections made.   Review of Systems:  All review of systems negative except what is listed in the HPI  Objective:    Alert and oriented x 4 Audibly congested, ill appearing Speaking in clear sentences with no shortness of breath. No distress.  Impression and Recommendations:    Problem List Items Addressed This Visit      Influenza-like illness - Primary    Symptoms and presentation consistent with influenza virus in the setting of negative COVID-19. She is just as the 72 hour mark, therefore we will go ahead and start tamiflu immediately, I do not want to postpone treatment waiting for a swab and patient is too ill to come out at this time. Will also send treatment with zofran for vomiting. Given her age, we will avoid phenergan at this time. Recommend rest as much as possible, increased hydration with water, gatorade, pedialyte, etc. She may take tylenol for fever/body aches. If this is not effective very low dose ibuprofen sparingly may be considered, her kidney function. Monitor urination and notify office immediately if going more than 8 hours without urination or if urine becomes very dark. F/U if no improvement in the next 3 days.       Relevant Medications   oseltamivir (TAMIFLU) 75 MG capsule   ondansetron (ZOFRAN) 4 MG tablet    orders and follow up as documented in EMR I discussed the assessment and treatment plan with the patient. The patient was provided an opportunity to ask questions and all were answered. The patient agreed with the plan and demonstrated an understanding of the instructions.   The patient was advised to call back or seek an in-person evaluation if the symptoms worsen or if the condition fails to improve as anticipated.  Follow-Up: prn  I provided 18 minutes of non-face-to-face interaction with this non face-to-face encounter including intake, same-day documentation, and chart review.   Tollie Eth, NP , DNP, AGNP-c [facility] at [facility] [phone] (fax)

## 2022-10-29 NOTE — ED Triage Notes (Signed)
Pelvic pain started last night, worse today, voiding ok, n/v.

## 2022-10-29 NOTE — ED Notes (Signed)
Provider informed of V/S.Marland KitchenMarland Kitchen

## 2022-10-29 NOTE — Assessment & Plan Note (Signed)
Symptoms and presentation consistent with influenza virus in the setting of negative COVID-19. She is just as the 72 hour mark, therefore we will go ahead and start tamiflu immediately, I do not want to postpone treatment waiting for a swab and patient is too ill to come out at this time. Will also send treatment with zofran for vomiting. Given her age, we will avoid phenergan at this time. Recommend rest as much as possible, increased hydration with water, gatorade, pedialyte, etc. She may take tylenol for fever/body aches. If this is not effective very low dose ibuprofen sparingly may be considered, her kidney function. Monitor urination and notify office immediately if going more than 8 hours without urination or if urine becomes very dark. F/U if no improvement in the next 3 days.

## 2022-10-29 NOTE — Patient Instructions (Signed)
Recommendations:  Rest as much as possible Stay well hydrated with water, may also drink gatroade, pedialyte, etc When trying foods, go very slowly and stop if you begin to feel nauseated or throw up- it is more important to stay hydrated at this point Keep a close eye on your blood sugar levels- we don't want them to go too high or too low.  Take tylenol for the fever and body aches, if this is not helpful, you may use very low dose (200mg ) ibuprofen every 8 hours, but do not use this for more than 2 days in a row as this can affect your kidneys.  I have sent medication for nausea, you can take this every 8 hours I have sent medication for the flu, you will take this twice a day for 5 days.  If you are not any better in the next 3 days or if you get any worse or have worsening symptoms please let know immediately.

## 2022-10-29 NOTE — Plan of Care (Signed)
   Patient Name: Maria Foster, rolon DOB: [birth year] MRN: [id number] Transferring facility: [facility] Requesting provider: groce, PA-C Reason for transfer: ??UTI vs early pyelo 66 yo WF with right sided abd pain/flank x 24 hours, no dysuria. having N/V. no fevers. WBC 9.9, given IV Rocephin. lactic acid 2.6 Going to: [facility] Admission Status: observation Bed Type: med/tele To Do:  TRH will assume care on arrival to accepting facility. Until arrival, care as per EDP. However, TRH available 24/7 for questions and assistance.   Nursing staff please page [facility] Admits and Consults [phone]) as soon as the patient arrives to the hospital.  Carollee Herter, DO [facility]

## 2022-10-29 NOTE — ED Provider Notes (Signed)
[facility] EMERGENCY DEPT Provider Note   CSN: [id number] Arrival date & time: 10/29/22  1437     History  Chief Complaint  Patient presents with   Abdominal Pain    Maria Foster is a 66 y.o. female with medical history of arthritis, diabetes 2, hypertension.  Patient presents ED for evaluation of lower abdominal pain.  Patient reports that abdominal pain began last night.  Patient states the abdominal pain wraps around to her right flank.  The patient denies any urinary symptoms, diarrhea.  Patient is endorsing nausea and vomiting.  Patient denies any fevers at home, dysuria, vaginal discharge.  Patient denies any history of the same.   Abdominal Pain Associated symptoms: nausea and vomiting   Associated symptoms: no constipation, no diarrhea, no dysuria, no fever and no vaginal discharge        Home Medications Prior to Admission medications   Medication Sig Start Date End Date Taking? Authorizing Provider  ACCU-CHEK GUIDE test strip See admin instructions. 01/12/21   [provider]  Accu-Chek Softclix Lancets lancets See admin instructions. 01/12/21   [provider]  acetaminophen (TYLENOL) 325 MG tablet Take 1-2 tablets (325-650 mg total) by mouth every 6 (six) hours as needed for mild pain (pain score 1-3 or temp > 100.5). 07/15/21   Raiford Noble Latif, DO  aspirin EC 81 MG tablet Take 81 mg by mouth every Monday, Wednesday, and Friday.    [provider]  atorvastatin (LIPITOR) 40 MG tablet Take 1 tablet (40 mg total) by mouth daily. 03/16/22   Bensimhon, Shaune Pascal, MD  bisacodyl (DULCOLAX) 5 MG EC tablet Take 1 tablet (5 mg total) by mouth daily as needed for moderate constipation. 07/15/21   Raiford Noble Latif, DO  Blood Glucose Monitoring Suppl (ACCU-CHEK GUIDE ME) w/Device KIT See admin instructions. 01/12/21   [provider]  docusate sodium (COLACE) 100 MG capsule Take 100 mg by mouth daily as needed for mild  constipation.    [provider]  ENTRESTO 97-103 MG TAKE 1 TABLET BY MOUTH TWICE DAILY 01/22/22   Bensimhon, Shaune Pascal, MD  ferrous gluconate (FERGON) 324 MG tablet TAKE 1 TABLET(324 MG) BY MOUTH TWICE DAILY WITH A MEAL 06/04/22   Tysinger, Camelia Eng, PA-C  furosemide (LASIX) 40 MG tablet Take 1 tablet (40 mg total) by mouth daily. 08/08/22   Bensimhon, Shaune Pascal, MD  guaiFENesin (MUCINEX) 600 MG 12 hr tablet Take 1 tablet (600 mg total) by mouth 2 (two) times daily. Patient not taking: Reported on 10/29/2022 10/18/21   Tysinger, Camelia Eng, PA-C  hydrALAZINE (APRESOLINE) 50 MG tablet Take 1 tablet (50 mg total) by mouth every 8 (eight) hours. 04/11/22   Bensimhon, Shaune Pascal, MD  isosorbide mononitrate (IMDUR) 60 MG 24 hr tablet TAKE 1 TABLET(60 MG) BY MOUTH DAILY 04/09/22   Bensimhon, Shaune Pascal, MD  ketorolac (ACULAR) 0.5 % ophthalmic solution SMARTSIG:In Eye(s) 02/06/22   [provider]  latanoprost (XALATAN) 0.005 % ophthalmic solution 1 drop at bedtime.    [provider]  leflunomide (ARAVA) 20 MG tablet Take 1 tablet (20 mg total) by mouth daily. 09/26/22   Rice, Resa Miner, MD  metoprolol succinate (TOPROL-XL) 50 MG 24 hr tablet TAKE 1 TABLET(50 MG) BY MOUTH DAILY WITH OR IMMEDIATELY FOLLOWING A MEAL 07/03/22   Tysinger, Camelia Eng, PA-C  ondansetron (ZOFRAN) 4 MG tablet Take 1 tablet (4 mg total) by mouth every 8 (eight) hours as needed for nausea or vomiting. 10/29/22  Orma Render, NP  oseltamivir (TAMIFLU) 75 MG capsule Take 1 capsule (75 mg total) by mouth 2 (two) times daily. 10/29/22   Orma Render, NP  polyethylene glycol (MIRALAX / GLYCOLAX) 17 g packet Take 17 g by mouth daily as needed for mild constipation. Patient not taking: Reported on 10/29/2022 07/14/21   Irving Copas, PA-C  potassium chloride SA (KLOR-CON) 20 MEQ tablet Take 2 tablets (40 mEq total) by mouth 2 (two) times daily. 09/19/21   Clegg, Amy D, NP  sacubitril-valsartan (ENTRESTO) 97-103 MG TAKE 1  TABLET BY MOUTH TWICE DAILY. NEED FOLLOW UP APPOINTMENT FOR ANYMORE REFILLS 10/24/22   Bensimhon, Shaune Pascal, MD  spironolactone (ALDACTONE) 25 MG tablet TAKE 1 TABLET(25 MG) BY MOUTH DAILY 09/24/22   Tysinger, Camelia Eng, PA-C  TRADJENTA 5 MG TABS tablet TAKE 1 TABLET(5 MG) BY MOUTH DAILY 08/30/22   Tysinger, Camelia Eng, PA-C  Vitamin D, Ergocalciferol, (DRISDOL) 1.25 MG (50000 UNIT) CAPS capsule Take 50,000 Units by mouth every Friday. 12/09/20   [provider]      Allergies    Metformin and related and Farxiga [dapagliflozin]    Review of Systems   Review of Systems  Constitutional:  Negative for fever.  Gastrointestinal:  Positive for abdominal pain, nausea and vomiting. Negative for constipation and diarrhea.  Genitourinary:  Positive for flank pain. Negative for dysuria and vaginal discharge.  All other systems reviewed and are negative.   Physical Exam Updated Vital Signs BP (!) 198/106   Pulse 99   Temp 98.5 F (36.9 C) (Oral)   Resp (!) 26   SpO2 95%  Physical Exam Vitals and nursing note reviewed.  Constitutional:      General: She is not in acute distress.    Appearance: She is well-developed. She is not ill-appearing, toxic-appearing or diaphoretic.  HENT:     Head: Normocephalic and atraumatic.     Mouth/Throat:     Mouth: Mucous membranes are dry.     Pharynx: No oropharyngeal exudate or posterior oropharyngeal erythema.  Eyes:     Conjunctiva/sclera: Conjunctivae normal.  Cardiovascular:     Rate and Rhythm: Regular rhythm. Tachycardia present.     Heart sounds: No murmur heard. Pulmonary:     Effort: Pulmonary effort is normal. No respiratory distress.     Breath sounds: Normal breath sounds. No wheezing.  Abdominal:     General: Abdomen is flat.     Palpations: Abdomen is soft.     Tenderness: There is abdominal tenderness in the right lower quadrant, suprapubic area and left lower quadrant.  Musculoskeletal:        General: No swelling.     Cervical  back: Normal range of motion and neck supple. No rigidity.  Skin:    General: Skin is warm and dry.     Capillary Refill: Capillary refill takes less than 2 seconds.  Neurological:     Mental Status: She is alert and oriented to person, place, and time.  Psychiatric:        Mood and Affect: Mood normal.     ED Results / Procedures / Treatments   Labs (all labs ordered are listed, but only abnormal results are displayed) Labs Reviewed  COMPREHENSIVE METABOLIC PANEL - Abnormal; Notable for the following components:      Result Value   Potassium 3.4 (*)    Chloride 95 (*)    Glucose, Bld 276 (*)    Total Protein 9.5 (*)  AST 14 (*)    Anion gap 18 (*)    All other components within normal limits  URINALYSIS, ROUTINE W REFLEX MICROSCOPIC - Abnormal; Notable for the following components:   APPearance HAZY (*)    Specific Gravity, Urine 1.044 (*)    Glucose, UA >1,000 (*)    Hgb urine dipstick MODERATE (*)    Ketones, ur 40 (*)    Protein, ur 30 (*)    Nitrite POSITIVE (*)    All other components within normal limits  LACTIC ACID, PLASMA - Abnormal; Notable for the following components:   Lactic Acid, Venous 2.6 (*)    All other components within normal limits  LACTIC ACID, PLASMA - Abnormal; Notable for the following components:   Lactic Acid, Venous 2.8 (*)    All other components within normal limits  RESP PANEL BY RT-PCR (RSV, FLU A&B, COVID)  RVPGX2  CULTURE, BLOOD (ROUTINE X 2)  CULTURE, BLOOD (ROUTINE X 2)  URINE CULTURE  LIPASE, BLOOD  CBC  MAGNESIUM    EKG EKG Interpretation  Date/Time:  Monday October 29 2022 16:39:38 EST Ventricular Rate:  104 PR Interval:  148 QRS Duration: 112 QT Interval:  402 QTC Calculation: 529 R Axis:   -35 Text Interpretation: Sinus tachycardia Borderline IVCD with LAD Anteroseptal infarct, old Prolonged QT interval Confirmed by Tretha Sciara [phone]) on 10/29/2022 6:56:10 PM  Radiology CT ABDOMEN PELVIS W  CONTRAST  Result Date: 10/29/2022 CLINICAL DATA:  Abdominal, pelvic, and flank pain since last night worse today, nausea, vomiting, history diabetes mellitus EXAM: CT ABDOMEN AND PELVIS WITH CONTRAST TECHNIQUE: Multidetector CT imaging of the abdomen and pelvis was performed using the standard protocol following bolus administration of intravenous contrast. RADIATION DOSE REDUCTION: This exam was performed according to the departmental dose-optimization program which includes automated exposure control, adjustment of the mA and/or kV according to patient size and/or use of iterative reconstruction technique. CONTRAST:  39m OMNIPAQUE IOHEXOL 300 MG/ML SOLN IV. No oral contrast. COMPARISON:  11/29/2016 FINDINGS: Lower chest: Lung bases clear Hepatobiliary: Focal fatty infiltration of liver adjacent to falciform fissure, unchanged. Gallbladder and liver otherwise normal appearance Pancreas: Normal appearance Spleen: Normal appearance Adrenals/Urinary Tract: Adrenal glands normal appearance. Cortical thinning anterior mid LEFT kidney. Kidneys otherwise normal appearance without mass or hydronephrosis. No hydronephrosis, hydroureter, or urinary tract calcification. Bladder unremarkable. Stomach/Bowel: Air-filled appendix in RIGHT pelvis, normal. Stomach normal appearance. Increased stool in rectum with associated inferior rectal wall thickening question proctitis. Remaining bowel loops otherwise normal appearance. Vascular/Lymphatic: Atherosclerotic calcifications aorta and iliac arteries without aneurysm. Scattered normal size inguinal lymph nodes. No definite abdominal or pelvic adenopathy. Reproductive: Unremarkable uterus and adnexa Other: No free air or free fluid.  No hernia. Musculoskeletal: Unremarkable IMPRESSION: Increased stool in rectum with associated inferior rectal wall thickening question proctitis. No other acute intra-abdominal or intrapelvic abnormalities. Aortic Atherosclerosis (ICD10-I70.0).  Electronically Signed   By: MLavonia DanaM.D.   On: 10/29/2022 17:27    Procedures Procedures   Medications Ordered in ED Medications  HYDROmorphone (DILAUDID) injection 1 mg (0 mg Intravenous Hold 10/29/22 2255)  potassium chloride SA (KLOR-CON M) CR tablet 40 mEq (has no administration in time range)  metoCLOPramide (REGLAN) injection 10 mg (10 mg Intravenous Given 10/29/22 1641)  fentaNYL (SUBLIMAZE) injection 50 mcg (50 mcg Intravenous Given 10/29/22 1641)  sodium chloride 0.9 % bolus 500 mL (0 mLs Intravenous Stopped 10/29/22 1834)  iohexol (OMNIPAQUE) 300 MG/ML solution 100 mL (80 mLs Intravenous Contrast Given 10/29/22 1707)  hydrALAZINE (APRESOLINE) tablet 50 mg (50 mg Oral Given 10/29/22 1811)  fentaNYL (SUBLIMAZE) injection 50 mcg (50 mcg Intravenous Given 10/29/22 1922)  labetalol (NORMODYNE) injection 10 mg (10 mg Intravenous Given 10/29/22 1931)  LORazepam (ATIVAN) injection 1 mg (1 mg Intravenous Given 10/29/22 1925)  sodium chloride 0.9 % bolus 500 mL (0 mLs Intravenous Stopped 10/29/22 2057)  cefTRIAXone (ROCEPHIN) 2 g in sodium chloride 0.9 % 100 mL IVPB (0 g Intravenous Stopped 10/29/22 2057)  sodium chloride 0.9 % bolus 1,000 mL (0 mLs Intravenous Stopped 10/29/22 2238)  labetalol (NORMODYNE) injection 10 mg (10 mg Intravenous Given 10/29/22 2302)    ED Course/ Medical Decision Making/ A&P Clinical Course as of 10/29/22 2357  Mon Oct 29, 2022  1855 Stable 72 YOF with a chief complaint of abdominal pain, N/V.  [CC]    Clinical Course User Index [CC] Tretha Sciara, MD                           Medical Decision Making Amount and/or Complexity of Data Reviewed Labs: ordered. Radiology: ordered.  Risk Prescription drug management.   66 year old female presents to the ED for evaluation.  Please see HPI for further details.  On my examination the patient is tachycardic, afebrile.  Patient lung sounds are clear bilaterally, she is not hypoxic.  Patient abdomen  soft and compressible however the patient does have tenderness to her lower abdominal fields.  Patient in obvious pain writhing in bed.  We will proceed with patient workup to include CBC, CMP, lipase, urinalysis, respiratory panel, lactic acid x 2, blood culture x 2, CT abdomen pelvis.  Patient initially treated with 10 mg of Reglan for nausea due to patient QT prolongation, 50 mcg fentanyl, 500 mL fluid.  Patient CBC without leukocytosis to 9.9.  Patient lipase unremarkable.  Patient CMP with decreased potassium to 3.4, elevated glucose of 276, anion gap to 18.  Patient respiratory panel negative for all.  Patient urinalysis shows moderate hemoglobin, high specific gravity, ketones, protein and nitrite positive urine concerning for urosepsis.  Patient also has elevated lactic to 2.6.  After IV fluids, patient lactic actually increased to 2.8.  Patient blood cultures been collected at this time.  Patient blood pressure was elevated throughout duration of stay.  Patient reports that she has not taken her home hydralazine today.  The patient was provided with 50 mg of hydralazine, 10 mg labetalol x 2.  Patient provided 2 g Rocephin IV due to suspected urosepsis.  The patient was also provided with a second liter of fluid at this time.  Despite IV fluid resuscitation, patient lactic acid has increased.  The patient remains tachycardic despite IV fluids.  At this time, after discussion with family at bedside and patient as well as discussion with my attending, we have decided to admit the patient.  The patient is amenable to the plan.  Tried hospitalist consulted at this time, awaiting callback.  Dr. Bridgett Larsson, [facility], has agreed to admit the patient for further management and care.  Patient amenable to plan.  Patient stable at time of admission.  Final Clinical Impression(s) / ED Diagnoses Final diagnoses:  Sepsis, due to unspecified organism, unspecified whether acute organ dysfunction  present [facility])  Acute cystitis with hematuria    Rx / DC Orders ED Discharge Orders     None         Azucena Cecil, PA-C 10/29/22 2357    Tretha Sciara,  MD 10/30/22 1507

## 2022-10-30 ENCOUNTER — Observation Stay (HOSPITAL_COMMUNITY): Payer: Medicare Other

## 2022-10-30 ENCOUNTER — Encounter (HOSPITAL_COMMUNITY): Payer: Self-pay | Admitting: Internal Medicine

## 2022-10-30 DIAGNOSIS — N3000 Acute cystitis without hematuria: Secondary | ICD-10-CM

## 2022-10-30 DIAGNOSIS — Z7982 Long term (current) use of aspirin: Secondary | ICD-10-CM | POA: Diagnosis not present

## 2022-10-30 DIAGNOSIS — I2510 Atherosclerotic heart disease of native coronary artery without angina pectoris: Secondary | ICD-10-CM | POA: Diagnosis not present

## 2022-10-30 DIAGNOSIS — M069 Rheumatoid arthritis, unspecified: Secondary | ICD-10-CM | POA: Diagnosis not present

## 2022-10-30 DIAGNOSIS — Z1152 Encounter for screening for COVID-19: Secondary | ICD-10-CM | POA: Diagnosis not present

## 2022-10-30 DIAGNOSIS — Z888 Allergy status to other drugs, medicaments and biological substances status: Secondary | ICD-10-CM | POA: Diagnosis not present

## 2022-10-30 DIAGNOSIS — E872 Acidosis, unspecified: Secondary | ICD-10-CM | POA: Diagnosis not present

## 2022-10-30 DIAGNOSIS — I428 Other cardiomyopathies: Secondary | ICD-10-CM

## 2022-10-30 DIAGNOSIS — E876 Hypokalemia: Secondary | ICD-10-CM | POA: Insufficient documentation

## 2022-10-30 DIAGNOSIS — Z8249 Family history of ischemic heart disease and other diseases of the circulatory system: Secondary | ICD-10-CM | POA: Diagnosis not present

## 2022-10-30 DIAGNOSIS — E1122 Type 2 diabetes mellitus with diabetic chronic kidney disease: Secondary | ICD-10-CM | POA: Diagnosis not present

## 2022-10-30 DIAGNOSIS — A419 Sepsis, unspecified organism: Secondary | ICD-10-CM | POA: Diagnosis not present

## 2022-10-30 DIAGNOSIS — I130 Hypertensive heart and chronic kidney disease with heart failure and stage 1 through stage 4 chronic kidney disease, or unspecified chronic kidney disease: Secondary | ICD-10-CM | POA: Diagnosis not present

## 2022-10-30 DIAGNOSIS — Z79899 Other long term (current) drug therapy: Secondary | ICD-10-CM | POA: Diagnosis not present

## 2022-10-30 DIAGNOSIS — I5042 Chronic combined systolic (congestive) and diastolic (congestive) heart failure: Secondary | ICD-10-CM | POA: Diagnosis not present

## 2022-10-30 DIAGNOSIS — E11319 Type 2 diabetes mellitus with unspecified diabetic retinopathy without macular edema: Secondary | ICD-10-CM | POA: Diagnosis not present

## 2022-10-30 DIAGNOSIS — N3001 Acute cystitis with hematuria: Secondary | ICD-10-CM | POA: Diagnosis present

## 2022-10-30 DIAGNOSIS — K6289 Other specified diseases of anus and rectum: Secondary | ICD-10-CM | POA: Diagnosis not present

## 2022-10-30 DIAGNOSIS — I160 Hypertensive urgency: Secondary | ICD-10-CM | POA: Diagnosis not present

## 2022-10-30 DIAGNOSIS — Z833 Family history of diabetes mellitus: Secondary | ICD-10-CM | POA: Diagnosis not present

## 2022-10-30 DIAGNOSIS — R059 Cough, unspecified: Secondary | ICD-10-CM | POA: Insufficient documentation

## 2022-10-30 DIAGNOSIS — R9431 Abnormal electrocardiogram [ECG] [EKG]: Secondary | ICD-10-CM | POA: Diagnosis not present

## 2022-10-30 DIAGNOSIS — E785 Hyperlipidemia, unspecified: Secondary | ICD-10-CM | POA: Diagnosis not present

## 2022-10-30 DIAGNOSIS — N1831 Chronic kidney disease, stage 3a: Secondary | ICD-10-CM | POA: Diagnosis not present

## 2022-10-30 DIAGNOSIS — F17210 Nicotine dependence, cigarettes, uncomplicated: Secondary | ICD-10-CM | POA: Diagnosis not present

## 2022-10-30 LAB — LACTIC ACID, PLASMA: Lactic Acid, Venous: 1.8 mmol/L (ref 0.5–1.9)

## 2022-10-30 LAB — COMPREHENSIVE METABOLIC PANEL
ALT: 13 U/L (ref 0–44)
AST: 19 U/L (ref 15–41)
Albumin: 3.4 g/dL — ABNORMAL LOW (ref 3.5–5.0)
Alkaline Phosphatase: 85 U/L (ref 38–126)
Anion gap: 11 (ref 5–15)
BUN: 18 mg/dL (ref 8–23)
CO2: 22 mmol/L (ref 22–32)
Calcium: 8.6 mg/dL — ABNORMAL LOW (ref 8.9–10.3)
Chloride: 103 mmol/L (ref 98–111)
Creatinine, Ser: 1.08 mg/dL — ABNORMAL HIGH (ref 0.44–1.00)
GFR, Estimated: 57 mL/min — ABNORMAL LOW (ref >60)
Glucose, Bld: 184 mg/dL — ABNORMAL HIGH (ref 70–99)
Potassium: 4.4 mmol/L (ref 3.5–5.1)
Sodium: 136 mmol/L (ref 135–145)
Total Bilirubin: 0.7 mg/dL (ref 0.3–1.2)
Total Protein: 7.7 g/dL (ref 6.5–8.1)

## 2022-10-30 LAB — MAGNESIUM: Magnesium: 1.6 mg/dL — ABNORMAL LOW (ref 1.7–2.4)

## 2022-10-30 LAB — GLUCOSE, CAPILLARY
Glucose-Capillary: 170 mg/dL — ABNORMAL HIGH (ref 70–99)
Glucose-Capillary: 125 mg/dL — ABNORMAL HIGH (ref 70–99)
Glucose-Capillary: 168 mg/dL — ABNORMAL HIGH (ref 70–99)
Glucose-Capillary: 217 mg/dL — ABNORMAL HIGH (ref 70–99)

## 2022-10-30 LAB — ECHOCARDIOGRAM COMPLETE
Area-P 1/2: 2.26 cm2
Calc EF: 51.5 %
Height: 63 in
MV M vel: 5.8 m/s
MV Peak grad: 134.6 mmHg
P 1/2 time: 336 msec
S' Lateral: 3.4 cm
Single Plane A2C EF: 50.9 %
Single Plane A4C EF: 51.9 %
Weight: 2321 oz

## 2022-10-30 LAB — HIV ANTIBODY (ROUTINE TESTING W REFLEX): HIV Screen 4th Generation wRfx: NONREACTIVE

## 2022-10-30 LAB — BRAIN NATRIURETIC PEPTIDE: B Natriuretic Peptide: 3799.8 pg/mL — ABNORMAL HIGH (ref 0.0–100.0)

## 2022-10-30 MED ORDER — ACETAMINOPHEN 325 MG PO TABS
650.0000 mg | ORAL_TABLET | Freq: Four times a day (QID) | ORAL | Status: DC | PRN
  Administered 2022-10-30: 650 mg via ORAL
  Filled 2022-10-30: qty 2

## 2022-10-30 MED ORDER — HYDRALAZINE HCL 50 MG PO TABS
50.0000 mg | ORAL_TABLET | Freq: Three times a day (TID) | ORAL | Status: DC
  Administered 2022-10-30 – 2022-11-01 (×6): 50 mg via ORAL
  Filled 2022-10-30 (×6): qty 1

## 2022-10-30 MED ORDER — SODIUM CHLORIDE 0.9 % IV SOLN: 2.0000 g | INTRAVENOUS | Status: DC

## 2022-10-30 MED ORDER — MAGNESIUM SULFATE 2 GM/50ML IV SOLN
2.0000 g | Freq: Once | INTRAVENOUS | Status: AC
  Administered 2022-10-30: 2 g via INTRAVENOUS
  Filled 2022-10-30: qty 50

## 2022-10-30 MED ORDER — ATORVASTATIN CALCIUM 40 MG PO TABS
40.0000 mg | ORAL_TABLET | Freq: Every day | ORAL | Status: DC
  Administered 2022-10-30 – 2022-11-01 (×3): 40 mg via ORAL
  Filled 2022-10-30 (×3): qty 1

## 2022-10-30 MED ORDER — INSULIN ASPART 100 UNIT/ML IJ SOLN
0.0000 [IU] | Freq: Three times a day (TID) | INTRAMUSCULAR | Status: DC
  Administered 2022-10-30: 2 [IU] via SUBCUTANEOUS
  Administered 2022-10-30 – 2022-10-31 (×3): 1 [IU] via SUBCUTANEOUS
  Administered 2022-10-31: 2 [IU] via SUBCUTANEOUS
  Administered 2022-11-01: 1 [IU] via SUBCUTANEOUS

## 2022-10-30 MED ORDER — HYDROMORPHONE HCL 1 MG/ML IJ SOLN
1.0000 mg | INTRAMUSCULAR | Status: DC | PRN
  Administered 2022-10-30 – 2022-10-31 (×2): 1 mg via INTRAVENOUS
  Filled 2022-10-30 (×2): qty 1

## 2022-10-30 MED ORDER — ACETAMINOPHEN 650 MG RE SUPP: 650.0000 mg | Freq: Four times a day (QID) | RECTAL | Status: DC | PRN

## 2022-10-30 MED ORDER — METOPROLOL SUCCINATE ER 50 MG PO TB24
50.0000 mg | ORAL_TABLET | Freq: Every day | ORAL | Status: DC
  Administered 2022-10-30 – 2022-11-01 (×2): 50 mg via ORAL
  Filled 2022-10-30 (×2): qty 1

## 2022-10-30 MED ORDER — BISACODYL 10 MG RE SUPP
10.0000 mg | Freq: Once | RECTAL | Status: AC
  Administered 2022-10-30: 10 mg via RECTAL
  Filled 2022-10-30: qty 1

## 2022-10-30 MED ORDER — ENOXAPARIN SODIUM 40 MG/0.4ML IJ SOSY
40.0000 mg | PREFILLED_SYRINGE | INTRAMUSCULAR | Status: DC
  Administered 2022-10-30 – 2022-10-31 (×2): 40 mg via SUBCUTANEOUS
  Filled 2022-10-30 (×3): qty 0.4

## 2022-10-30 MED ORDER — ASPIRIN 81 MG PO TBEC
81.0000 mg | DELAYED_RELEASE_TABLET | ORAL | Status: DC
  Administered 2022-10-31: 81 mg via ORAL
  Filled 2022-10-30: qty 1

## 2022-10-30 MED ORDER — GUAIFENESIN-DM 100-10 MG/5ML PO SYRP
5.0000 mL | ORAL_SOLUTION | ORAL | Status: DC | PRN
  Administered 2022-10-30 – 2022-10-31 (×3): 5 mL via ORAL
  Filled 2022-10-30 (×3): qty 10

## 2022-10-30 MED ORDER — SACUBITRIL-VALSARTAN 97-103 MG PO TABS
1.0000 | ORAL_TABLET | Freq: Two times a day (BID) | ORAL | Status: DC
  Administered 2022-10-30 – 2022-11-01 (×4): 1 via ORAL
  Filled 2022-10-30 (×5): qty 1

## 2022-10-30 MED ORDER — POLYETHYLENE GLYCOL 3350 17 G PO PACK: 17.0000 g | PACK | Freq: Every day | ORAL | Status: DC | PRN

## 2022-10-30 MED ORDER — ISOSORBIDE MONONITRATE ER 60 MG PO TB24
60.0000 mg | ORAL_TABLET | Freq: Every day | ORAL | Status: DC
  Administered 2022-10-30 – 2022-11-01 (×2): 60 mg via ORAL
  Filled 2022-10-30 (×2): qty 1

## 2022-10-30 MED ORDER — PROCHLORPERAZINE EDISYLATE 10 MG/2ML IJ SOLN
10.0000 mg | Freq: Four times a day (QID) | INTRAMUSCULAR | Status: DC | PRN
  Administered 2022-10-30 – 2022-10-31 (×2): 10 mg via INTRAVENOUS
  Filled 2022-10-30 (×2): qty 2

## 2022-10-30 MED ORDER — LEFLUNOMIDE 10 MG PO TABS
20.0000 mg | ORAL_TABLET | Freq: Every day | ORAL | Status: DC
  Administered 2022-10-30 – 2022-10-31 (×2): 20 mg via ORAL
  Filled 2022-10-30 (×2): qty 2

## 2022-10-30 MED ORDER — SODIUM CHLORIDE 0.9 % IV SOLN
1.0000 g | INTRAVENOUS | Status: DC
  Administered 2022-10-30 – 2022-10-31 (×2): 1 g via INTRAVENOUS
  Filled 2022-10-30 (×2): qty 10

## 2022-10-30 MED ORDER — TRIMETHOBENZAMIDE HCL 100 MG/ML IM SOLN: 200.0000 mg | Freq: Three times a day (TID) | INTRAMUSCULAR | Status: DC | PRN

## 2022-10-30 MED ORDER — INSULIN ASPART 100 UNIT/ML IJ SOLN
0.0000 [IU] | Freq: Every day | INTRAMUSCULAR | Status: DC
  Administered 2022-10-30: 2 [IU] via SUBCUTANEOUS

## 2022-10-30 NOTE — Progress Notes (Signed)
Mobility Specialist Cancellation/Refusal Note:  Pt declined mobility at this time. Pt not feeling well. Requesting MS to return on 12/13. Will check back as schedule permits.    [facility]

## 2022-10-30 NOTE — Plan of Care (Signed)
  Problem: Education: Goal: Knowledge of General Education information will improve Description Including pain rating scale, medication(s)/side effects and non-pharmacologic comfort measures Outcome: Progressing   Problem: Health Behavior/Discharge Planning: Goal: Ability to manage health-related needs will improve Outcome: Progressing   

## 2022-10-30 NOTE — ED Notes (Signed)
Dr. Nicanor Alcon, ED provider now on duty, notified via secure chat of patient Mg+ results (1.6)

## 2022-10-30 NOTE — TOC Initial Note (Signed)
Transition of Care [facility]) - Initial/Assessment Note    Patient Details  Name: Maria Foster MRN: [id number] Date of Birth: 01/20/1956  [facility] CM/SW Contact:    Golda Acre, RN Phone Number: 10/30/2022, 8:07 AM  Clinical Narrative:                 Information for smoking cessation added to the dc instructions  Expected Discharge Plan: Home/Self Care Barriers to Discharge: Continued Medical Work up   Patient Goals and CMS Choice Patient states their goals for this hospitalization and ongoing recovery are:: to go home CMS Medicare.gov Compare Post Acute Care list provided to:: Patient Choice offered to / list presented to : Patient  Expected Discharge Plan and Services Expected Discharge Plan: Home/Self Care   Discharge Planning Services: CM Consult   Living arrangements for the past 2 months: Single Family Home                                      Prior Living Arrangements/Services Living arrangements for the past 2 months: Single Family Home Lives with:: Spouse Patient language and need for interpreter reviewed:: Yes Do you feel safe going back to the place where you live?: Yes            Criminal Activity/Legal Involvement Pertinent to Current Situation/Hospitalization: No - Comment as needed  Activities of Daily Living      Permission Sought/Granted                  Emotional Assessment Appearance:: Appears stated age   Affect (typically observed): Calm Orientation: : Oriented to Self, Oriented to Place, Oriented to  Time, Oriented to Situation Alcohol / Substance Use: Tobacco Use    Admission diagnosis:  Acute cystitis with hematuria [N30.01] Acute cystitis without hematuria [N30.00] Sepsis, due to unspecified organism, unspecified whether acute organ dysfunction present [facility]) [A41.9] Patient Active Problem List   Diagnosis Date Noted   Hypertensive urgency 10/30/2022   Hypokalemia 10/30/2022   Hypomagnesemia  10/30/2022   Cough 10/30/2022   Influenza-like illness 10/29/2022   Acute cystitis without hematuria 10/29/2022   Pseudophakia, both eyes 01/29/2022   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 type 2 diabetes mellitus (HCC) 11/16/2021   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 2 diabetes mellitus (HCC) 11/16/2021   OSA (obstructive sleep apnea) 08/25/2021   Iron deficiency 08/25/2021   Screening for cancer 08/25/2021   Influenza vaccination declined 08/25/2021   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   Stage 3a chronic kidney disease (HCC) 07/11/2021   Septic olecranon bursitis of right elbow 07/11/2021   Essential hypertension, benign 05/04/2021   Hyperlipidemia associated with type 2 diabetes mellitus (HCC) 05/04/2021   Enlarged and hypertrophic nails 05/04/2021   Encounter for screening mammogram for malignant neoplasm of breast 05/04/2021   Need for pneumococcal vaccination 05/04/2021   CHF (congestive heart failure) (HCC) 05/04/2021   Stable angina 05/04/2021   Vaccine counseling 05/04/2021   Tobacco use 05/04/2021   High risk medication use 03/29/2021   Screen for colon cancer 01/27/2021   Tophi 01/11/2021   Rheumatoid arthritis involving both hands (HCC) 01/11/2021   Elevated troponin    Type 2 diabetes mellitus (HCC) 12/19/2020   Constipation 12/19/2020   PCP:  Jac Canavan, PA-C Pharmacy:   [id number] - [place] - [address] AT [facility] [address] [place] [address] Phone: [phone] Fax: [phone]     Social Determinants of Health (SDOH) Interventions    Readmission Risk Interventions   No data to display

## 2022-10-30 NOTE — Progress Notes (Addendum)
Patient moved to the hospital earlier this morning by Dr. Loney Loh  Patient seen and examined.  Felt overall her abdominal pain was better.  She is not having any nausea or vomiting.  She denies any shortness of breath.  She was having nausea vomiting and abdominal pain earlier.  Concerned that this may have been precipitated by UTI.  She is currently on antibiotics with urine culture in process.  Treating supportively with antiemetics.  CT of the abdomen pelvis did show fair amount of stool in the rectum with possible proctitis.  Clinically, she does not have any symptoms of proctitis, with no pain on defecation.    She does have known CHF and is followed in the advanced heart failure clinic.  Will resume her cardiac regimen including beta-blockers, Entresto, hydralazine.  Due to elevated BNP, her echocardiogram was repeated which showed EF of 45 to 50% and grade 1 diastolic dysfunction.  LV demonstrated global hypokinesis.  When compared to prior imaging, it was noted that LVEF has improved from prior.  She plans to follow-up with advanced heart failure clinic as scheduled.  Currently appears to be euvolemic.  She does have rheumatoid arthritis and is continued on [place].  Further plans per history and physical done earlier today.  [employer]

## 2022-10-30 NOTE — Progress Notes (Signed)
  Echocardiogram 2D Echocardiogram has been performed.  Maria Foster 10/30/2022, 12:08 PM

## 2022-10-30 NOTE — ED Notes (Signed)
Entered room to find pt sitting up in bed teary-eyed;  pt denied anything being wrong however family member states pt is upset due to pain.  Educated pt that she could have the IVP dilaudid and has subsequently been administered and pt now more relaxed.  Pt placed back on 2L O2 via [place] due to O2 desat to 89-90% after Dilaudid being administered.  No additional needs verbalized at this time- will continue to monitor for acute changes and maintain plan of care.

## 2022-10-30 NOTE — Progress Notes (Signed)
Pt arrived from [facility]. Vitals taken. Pt is stable. Notified provider.

## 2022-10-30 NOTE — H&P (Signed)
History and Physical    [name] KVQ:[id number] DOB: [birth year] DOA: 10/29/2022  PCP: Carlena Hurl, PA-C  Patient coming from: Home  Chief Complaint: Abdominal pain  HPI: Maria Foster is a 66 y.o. 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  Vital signs on arrival to the ED: Temperature 97.7 F, pulse 109, respiratory rate 22, blood pressure 214/119, SpO2 99% on room air.  Labs showing no leukocytosis or anemia, potassium 3.4, glucose 276, bicarb 23, no elevation of lipase and LFTs, COVID and influenza PCR negative, lactic acid 2.6> 2.8, magnesium 1.6.  UA with positive nitrite concerning for infection.  Urine and blood cultures pending.  CT abdomen pelvis showing increased stool in the rectum with associated inferior rectal wall thickening, question proctitis.  No other acute intra-abdominal or intrapelvic abnormalities. Patient was given fentanyl, hydralazine, Dilaudid, labetalol, Ativan, Reglan, oral potassium 40 mEq, ceftriaxone, and 2 L IV fluids.  Transferred to [facility] long hospital.  Patient reports 2-day history of lower abdominal pain which she describes as suprapubic mostly but radiates to both lower quadrants and wraps around to her back.  She reports nausea and some vomiting at home.  Reports urinary frequency and urgency which she attributes to taking a diuretic.  Denies dysuria.  Denies any rectal pain or blood in her stool.  She is also reporting fevers, chills, and cough for the past 2 days.  She is a smoker and stopped smoking cigarettes a week ago.  Denies chest pain or shortness of breath.  Patient states she feels much better now and no longer having any abdominal pain and was able to  eat broth in the ED without vomiting.  She is requesting food.  Review of Systems:  Review of Systems  All other systems reviewed and are negative.   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   OU progressive, will medically need to have cataract surgery with lens will implantation so as to allow for complete clearance of the vitreous cavity in anticipation of likely vitrectomy in the future for progressive PDR despite previous PRP as delineated by the E DRS series of reports   Diabetes mellitus type 2 with complications ([place])    diagnosed probably 2012 per patient   Diabetic retinopathy ([place])    Hypertension    Rheumatoid arthritis ([place])     Past Surgical History:  Procedure Laterality Date   INCISION AND DRAINAGE ABSCESS N/A 07/11/2021   Procedure: INCISION AND DRAINAGE RIGHT ELBOW;  Surgeon: Paralee Cancel, MD;  Location: [facility] ORS;  Service: Orthopedics;  Laterality: N/A;   PRP Bilateral OD - 07/23/2017 OS - 08/06/2017   RIGHT/LEFT HEART CATH AND CORONARY ANGIOGRAPHY N/A 01/05/2021   Procedure: RIGHT/LEFT HEART CATH AND CORONARY ANGIOGRAPHY;  Surgeon: Jolaine Artist, MD;  Location: [place] [facility];  Service: Cardiovascular;  Laterality: N/A;   TEE WITHOUT CARDIOVERSION N/A 10/09/2021   Procedure: TRANSESOPHAGEAL ECHOCARDIOGRAM (TEE);  Surgeon: Jolaine Artist, MD;  Location: [facility] ENDOSCOPY;  Service: Cardiovascular;  Laterality: N/A;     reports that she has been smoking cigarettes. She has a 7.50 pack-year smoking history. She has been exposed to tobacco smoke. She has never used smokeless tobacco.  She reports that she does not currently use alcohol. She reports that she does not use drugs.  Allergies  Allergen Reactions   Metformin And Related Other (See Comments)    Kidney injury, GI upset, diarrhea   Farxiga [Dapagliflozin]     Yeast infections, recurrent    Family History  Problem Relation  Age of Onset   Diabetes Mother    Hypertension Mother    Heart Problems Sister    Amblyopia Neg Hx    Blindness Neg Hx    Cataracts Neg Hx    Glaucoma Neg Hx    Macular degeneration Neg Hx    Retinal detachment Neg Hx    Strabismus Neg Hx    Retinitis pigmentosa Neg Hx     Prior to Admission medications   Medication Sig Start Date End Date Taking? Authorizing Provider  ACCU-CHEK GUIDE test strip See admin instructions. 01/12/21   [provider]  Accu-Chek Softclix Lancets lancets See admin instructions. 01/12/21   [provider]  acetaminophen (TYLENOL) 325 MG tablet Take 1-2 tablets (325-650 mg total) by mouth every 6 (six) hours as needed for mild pain (pain score 1-3 or temp > 100.5). 07/15/21   Raiford Noble Latif, DO  aspirin EC 81 MG tablet Take 81 mg by mouth every Monday, Wednesday, and Friday.    [provider]  atorvastatin (LIPITOR) 40 MG tablet Take 1 tablet (40 mg total) by mouth daily. 03/16/22   Bensimhon, Shaune Pascal, MD  bisacodyl (DULCOLAX) 5 MG EC tablet Take 1 tablet (5 mg total) by mouth daily as needed for moderate constipation. 07/15/21   Raiford Noble Latif, DO  Blood Glucose Monitoring Suppl (ACCU-CHEK GUIDE ME) w/Device KIT See admin instructions. 01/12/21   [provider]  docusate sodium (COLACE) 100 MG capsule Take 100 mg by mouth daily as needed for mild constipation.    [provider]  ENTRESTO 97-103 MG TAKE 1 TABLET BY MOUTH TWICE DAILY 01/22/22   Bensimhon, Shaune Pascal, MD  ferrous gluconate (FERGON) 324 MG tablet TAKE 1 TABLET(324 MG) BY MOUTH TWICE DAILY WITH A MEAL 06/04/22   Tysinger, Camelia Eng, PA-C  furosemide (LASIX) 40 MG tablet Take 1 tablet (40 mg total) by mouth daily. 08/08/22   Bensimhon, Shaune Pascal, MD  guaiFENesin (MUCINEX) 600 MG 12 hr tablet Take 1 tablet (600 mg total) by mouth 2 (two) times daily. Patient not taking: Reported on 10/29/2022 10/18/21   Tysinger, Camelia Eng, PA-C  hydrALAZINE (APRESOLINE) 50 MG  tablet Take 1 tablet (50 mg total) by mouth every 8 (eight) hours. 04/11/22   Bensimhon, Shaune Pascal, MD  isosorbide mononitrate (IMDUR) 60 MG 24 hr tablet TAKE 1 TABLET(60 MG) BY MOUTH DAILY 04/09/22   Bensimhon, Shaune Pascal, MD  ketorolac (ACULAR) 0.5 % ophthalmic solution SMARTSIG:In Eye(s) 02/06/22   [provider]  latanoprost (XALATAN) 0.005 % ophthalmic solution 1 drop at bedtime.    [provider]  leflunomide (ARAVA) 20 MG tablet Take 1 tablet (20 mg total) by mouth daily. 09/26/22   Rice, Resa Miner, MD  metoprolol succinate (TOPROL-XL) 50 MG 24 hr tablet TAKE 1 TABLET(50 MG) BY MOUTH DAILY WITH OR IMMEDIATELY FOLLOWING A MEAL 07/03/22   Tysinger, Camelia Eng, PA-C  ondansetron (ZOFRAN) 4 MG tablet Take 1 tablet (4 mg total) by mouth every 8 (eight) hours as needed for nausea or vomiting. 10/29/22   Early, Coralee Pesa, NP  oseltamivir (TAMIFLU) 75 MG capsule Take 1 capsule (75 mg total) by  mouth 2 (two) times daily. 10/29/22   Orma Render, NP  polyethylene glycol (MIRALAX / GLYCOLAX) 17 g packet Take 17 g by mouth daily as needed for mild constipation. Patient not taking: Reported on 10/29/2022 07/14/21   Irving Copas, PA-C  potassium chloride SA (KLOR-CON) 20 MEQ tablet Take 2 tablets (40 mEq total) by mouth 2 (two) times daily. 09/19/21   Clegg, Amy D, NP  sacubitril-valsartan (ENTRESTO) 97-103 MG TAKE 1 TABLET BY MOUTH TWICE DAILY. NEED FOLLOW UP APPOINTMENT FOR ANYMORE REFILLS 10/24/22   Bensimhon, Shaune Pascal, MD  spironolactone (ALDACTONE) 25 MG tablet TAKE 1 TABLET(25 MG) BY MOUTH DAILY 09/24/22   Tysinger, Camelia Eng, PA-C  TRADJENTA 5 MG TABS tablet TAKE 1 TABLET(5 MG) BY MOUTH DAILY 08/30/22   Tysinger, Camelia Eng, PA-C  Vitamin D, Ergocalciferol, (DRISDOL) 1.25 MG (50000 UNIT) CAPS capsule Take 50,000 Units by mouth every Friday. 12/09/20   [provider]    Physical Exam: Vitals:   10/30/22 0100 10/30/22 0112 10/30/22 0130 10/30/22 0239  BP: (!) 150/79  (!) 152/81 (!)  166/82  Pulse: 81  79 84  Resp: 15  16   Temp:    98.1 F (36.7 C)  TempSrc:    Oral  SpO2: 95%  95% 93%  Weight:  65.8 kg    Height:  _0  (1.6 m)      Physical Exam Vitals reviewed.  Constitutional:      General: She is not in acute distress. HENT:     Head: Normocephalic and atraumatic.  Eyes:     Extraocular Movements: Extraocular movements intact.  Cardiovascular:     Rate and Rhythm: Normal rate and regular rhythm.     Pulses: Normal pulses.  Pulmonary:     Effort: Pulmonary effort is normal. No respiratory distress.     Breath sounds: No wheezing or rales.  Abdominal:     General: Bowel sounds are normal. There is no distension.     Palpations: Abdomen is soft.     Tenderness: There is no abdominal tenderness.  Musculoskeletal:     Cervical back: Normal range of motion.     Right lower leg: No edema.     Left lower leg: No edema.     Comments: No CVA tenderness  Skin:    General: Skin is warm and dry.  Neurological:     General: No focal deficit present.     Mental Status: She is alert and oriented to person, place, and time.     Labs on Admission: I have personally reviewed following labs and imaging studies  CBC: Recent Labs  Lab 10/29/22 1500  WBC 9.9  HGB 14.6  HCT 45.6  MCV 89.9  PLT 309   Basic Metabolic Panel: Recent Labs  Lab 10/29/22 1500 10/30/22 0010  NA 136  --   K 3.4*  --   CL 95*  --   CO2 23  --   GLUCOSE 276*  --   BUN 14  --   CREATININE 0.85  --   CALCIUM 10.0  --   MG  --  1.6*   GFR: Estimated Creatinine Clearance: 59.4 mL/min (by C-G formula based on SCr of 0.85 mg/dL). Liver Function Tests: Recent Labs  Lab 10/29/22 1500  AST 14*  ALT 12  ALKPHOS 114  BILITOT 0.9  PROT 9.5*  ALBUMIN 4.7   Recent Labs  Lab 10/29/22 1500  LIPASE 11   No results for input(s): "AMMONIA"  in the last 168 hours. Coagulation Profile: No results for input(s): "INR", "PROTIME" in the last 168 hours. Cardiac Enzymes: No  results for input(s): "CKTOTAL", "CKMB", "CKMBINDEX", "TROPONINI" in the last 168 hours. BNP (last 3 results) No results for input(s): "PROBNP" in the last 8760 hours. HbA1C: No results for input(s): "HGBA1C" in the last 72 hours. CBG: No results for input(s): "GLUCAP" in the last 168 hours. Lipid Profile: No results for input(s): "CHOL", "HDL", "LDLCALC", "TRIG", "CHOLHDL", "LDLDIRECT" in the last 72 hours. Thyroid Function Tests: No results for input(s): "TSH", "T4TOTAL", "FREET4", "T3FREE", "THYROIDAB" in the last 72 hours. Anemia Panel: No results for input(s): "VITAMINB12", "FOLATE", "FERRITIN", "TIBC", "IRON", "RETICCTPCT" in the last 72 hours. Urine analysis:    Component Value Date/Time   COLORURINE YELLOW 10/29/2022 1930   APPEARANCEUR HAZY (A) 10/29/2022 1930   LABSPEC 1.044 (H) 10/29/2022 1930   LABSPEC 1.015 05/04/2021 1230   PHURINE 6.0 10/29/2022 1930   GLUCOSEU >1,000 (A) 10/29/2022 1930   HGBUR MODERATE (A) 10/29/2022 1930   BILIRUBINUR NEGATIVE 10/29/2022 1930   BILIRUBINUR negative 05/04/2021 1230   KETONESUR 40 (A) 10/29/2022 1930   PROTEINUR 30 (A) 10/29/2022 1930   UROBILINOGEN 1.0 10/17/2009 1021   NITRITE POSITIVE (A) 10/29/2022 1930   LEUKOCYTESUR NEGATIVE 10/29/2022 1930    Radiological Exams on Admission: CT ABDOMEN PELVIS W CONTRAST  Result Date: 10/29/2022 CLINICAL DATA:  Abdominal, pelvic, and flank pain since last night worse today, nausea, vomiting, history diabetes mellitus EXAM: CT ABDOMEN AND PELVIS WITH CONTRAST TECHNIQUE: Multidetector CT imaging of the abdomen and pelvis was performed using the standard protocol following bolus administration of intravenous contrast. RADIATION DOSE REDUCTION: This exam was performed according to the departmental dose-optimization program which includes automated exposure control, adjustment of the mA and/or kV according to patient size and/or use of iterative reconstruction technique. CONTRAST:  11m OMNIPAQUE  IOHEXOL 300 MG/ML SOLN IV. No oral contrast. COMPARISON:  11/29/2016 FINDINGS: Lower chest: Lung bases clear Hepatobiliary: Focal fatty infiltration of liver adjacent to falciform fissure, unchanged. Gallbladder and liver otherwise normal appearance Pancreas: Normal appearance Spleen: Normal appearance Adrenals/Urinary Tract: Adrenal glands normal appearance. Cortical thinning anterior mid LEFT kidney. Kidneys otherwise normal appearance without mass or hydronephrosis. No hydronephrosis, hydroureter, or urinary tract calcification. Bladder unremarkable. Stomach/Bowel: Air-filled appendix in RIGHT pelvis, normal. Stomach normal appearance. Increased stool in rectum with associated inferior rectal wall thickening question proctitis. Remaining bowel loops otherwise normal appearance. Vascular/Lymphatic: Atherosclerotic calcifications aorta and iliac arteries without aneurysm. Scattered normal size inguinal lymph nodes. No definite abdominal or pelvic adenopathy. Reproductive: Unremarkable uterus and adnexa Other: No free air or free fluid.  No hernia. Musculoskeletal: Unremarkable IMPRESSION: Increased stool in rectum with associated inferior rectal wall thickening question proctitis. No other acute intra-abdominal or intrapelvic abnormalities. Aortic Atherosclerosis (ICD10-I70.0). Electronically Signed   By: MLavonia DanaM.D.   On: 10/29/2022 17:27    EKG: Independently reviewed.  Sinus tachycardia, QTc 529.  Assessment and Plan  Sepsis secondary to possible acute cystitis Patient presenting with complaints of fevers, chills, lower abdominal/suprapubic pain, urinary frequency/urgency, nausea, and vomiting.  UA with positive nitrite.  She has no CVA tenderness on exam and no evidence of pyelonephritis on CT.  Met criteria for severe sepsis at the time of presentation with tachycardia, tachypnea, and lactic acidosis (2.6> 2.8).  No leukocytosis.  Vital signs now improved.  No longer having any abdominal pain or  vomiting and was able to tolerate p.o. intake in the ED. -Continue ceftriaxone -Urine  and blood cultures pending -Patient received 2 L IV fluids, repeat lactate  Hypertensive urgency Blood pressure 214/119 on arrival to the ED.  Patient received hydralazine and labetalol, blood pressure now improved with systolic down to [id number]. -Resume home medications after pharmacy med rec is done.  ?Proctitis CT showing increased stool in the rectum with associated inferior rectal wall thickening, question proctitis.  Patient is not having any rectal pain.  Hypokalemia and hypomagnesemia QT prolongation Potassium 3.4, magnesium 1.6.  QTc 529. -Cardiac monitoring -Patient received potassium supplement in the ED, continue to monitor labs -Replace magnesium -Avoid QT prolonging drugs -Repeat EKG in a.m.  Cough Patient is not hypoxic.  Lungs clear on exam and lung bases clear on CT abdomen pelvis.  COVID and influenza PCR negative.  She is a smoker and states she has not smoked cigarettes for a week. -Antitussive as needed  Non-insulin-dependent type 2 diabetes Glucose in the 200s.  A1c 6.6 on 12/26/2021. -Repeat A1c -Sensitive sliding scale insulin ACHS  CKD stage II-IIIa Renal function stable.  Combined CHF No signs of volume overload.  -Check BNP  CAD: Not endorsing chest pain. Hyperlipidemia Rheumatoid arthritis -Pharmacy med rec pending.  DVT prophylaxis: Lovenox Code Status: Full Code (discussed with the patient) Family Communication: No family available at this time. Level of care: Telemetry bed Admission status: It is my clinical opinion that referral for OBSERVATION is reasonable and necessary in this patient based on the above information provided. The aforementioned taken together are felt to place the patient at high risk for further clinical deterioration. However, it is anticipated that the patient may be medically stable for discharge from the hospital within 24 to 48  hours.   Shela Leff MD [facility]  If 7PM-7AM, please contact night-coverage [email or web address]  10/30/2022, 5:27 AM

## 2022-10-31 DIAGNOSIS — Z79899 Other long term (current) drug therapy: Secondary | ICD-10-CM | POA: Diagnosis not present

## 2022-10-31 DIAGNOSIS — Z833 Family history of diabetes mellitus: Secondary | ICD-10-CM | POA: Diagnosis not present

## 2022-10-31 DIAGNOSIS — N3090 Cystitis, unspecified without hematuria: Secondary | ICD-10-CM | POA: Diagnosis present

## 2022-10-31 DIAGNOSIS — E876 Hypokalemia: Secondary | ICD-10-CM | POA: Diagnosis present

## 2022-10-31 DIAGNOSIS — E785 Hyperlipidemia, unspecified: Secondary | ICD-10-CM | POA: Diagnosis present

## 2022-10-31 DIAGNOSIS — E11319 Type 2 diabetes mellitus with unspecified diabetic retinopathy without macular edema: Secondary | ICD-10-CM | POA: Diagnosis present

## 2022-10-31 DIAGNOSIS — Z8249 Family history of ischemic heart disease and other diseases of the circulatory system: Secondary | ICD-10-CM | POA: Diagnosis not present

## 2022-10-31 DIAGNOSIS — E872 Acidosis, unspecified: Secondary | ICD-10-CM | POA: Diagnosis present

## 2022-10-31 DIAGNOSIS — N3000 Acute cystitis without hematuria: Secondary | ICD-10-CM | POA: Diagnosis present

## 2022-10-31 DIAGNOSIS — I160 Hypertensive urgency: Secondary | ICD-10-CM | POA: Diagnosis present

## 2022-10-31 DIAGNOSIS — K6289 Other specified diseases of anus and rectum: Secondary | ICD-10-CM | POA: Diagnosis present

## 2022-10-31 DIAGNOSIS — I2510 Atherosclerotic heart disease of native coronary artery without angina pectoris: Secondary | ICD-10-CM | POA: Diagnosis present

## 2022-10-31 DIAGNOSIS — M069 Rheumatoid arthritis, unspecified: Secondary | ICD-10-CM | POA: Diagnosis present

## 2022-10-31 DIAGNOSIS — Z888 Allergy status to other drugs, medicaments and biological substances status: Secondary | ICD-10-CM | POA: Diagnosis not present

## 2022-10-31 DIAGNOSIS — R9431 Abnormal electrocardiogram [ECG] [EKG]: Secondary | ICD-10-CM | POA: Diagnosis present

## 2022-10-31 DIAGNOSIS — Z1152 Encounter for screening for COVID-19: Secondary | ICD-10-CM | POA: Diagnosis not present

## 2022-10-31 DIAGNOSIS — I5042 Chronic combined systolic (congestive) and diastolic (congestive) heart failure: Secondary | ICD-10-CM | POA: Diagnosis present

## 2022-10-31 DIAGNOSIS — Z7982 Long term (current) use of aspirin: Secondary | ICD-10-CM | POA: Diagnosis not present

## 2022-10-31 DIAGNOSIS — N3001 Acute cystitis with hematuria: Secondary | ICD-10-CM | POA: Diagnosis present

## 2022-10-31 DIAGNOSIS — F17210 Nicotine dependence, cigarettes, uncomplicated: Secondary | ICD-10-CM | POA: Diagnosis present

## 2022-10-31 DIAGNOSIS — E1122 Type 2 diabetes mellitus with diabetic chronic kidney disease: Secondary | ICD-10-CM | POA: Diagnosis present

## 2022-10-31 DIAGNOSIS — N1831 Chronic kidney disease, stage 3a: Secondary | ICD-10-CM | POA: Diagnosis present

## 2022-10-31 DIAGNOSIS — R059 Cough, unspecified: Secondary | ICD-10-CM | POA: Diagnosis present

## 2022-10-31 DIAGNOSIS — A419 Sepsis, unspecified organism: Secondary | ICD-10-CM | POA: Diagnosis present

## 2022-10-31 DIAGNOSIS — I130 Hypertensive heart and chronic kidney disease with heart failure and stage 1 through stage 4 chronic kidney disease, or unspecified chronic kidney disease: Secondary | ICD-10-CM | POA: Diagnosis present

## 2022-10-31 LAB — BASIC METABOLIC PANEL
Anion gap: 7 (ref 5–15)
BUN: 22 mg/dL (ref 8–23)
CO2: 21 mmol/L — ABNORMAL LOW (ref 22–32)
Calcium: 8.4 mg/dL — ABNORMAL LOW (ref 8.9–10.3)
Chloride: 105 mmol/L (ref 98–111)
Creatinine, Ser: 1.15 mg/dL — ABNORMAL HIGH (ref 0.44–1.00)
GFR, Estimated: 53 mL/min — ABNORMAL LOW (ref >60)
Glucose, Bld: 136 mg/dL — ABNORMAL HIGH (ref 70–99)
Potassium: 4 mmol/L (ref 3.5–5.1)
Sodium: 133 mmol/L — ABNORMAL LOW (ref 135–145)

## 2022-10-31 LAB — CBC
HCT: 39.8 % (ref 36.0–46.0)
Hemoglobin: 12.4 g/dL (ref 12.0–15.0)
MCH: 28.8 pg (ref 26.0–34.0)
MCHC: 31.2 g/dL (ref 30.0–36.0)
MCV: 92.3 fL (ref 80.0–100.0)
Platelets: 160 10*3/uL (ref 150–400)
RBC: 4.31 MIL/uL (ref 3.87–5.11)
RDW: 13.2 % (ref 11.5–15.5)
WBC: 8.3 10*3/uL (ref 4.0–10.5)
nRBC: 0 % (ref 0.0–0.2)

## 2022-10-31 LAB — URINE CULTURE: Culture: >=100000 — AB

## 2022-10-31 LAB — HEMOGLOBIN A1C
Hgb A1c MFr Bld: 7.2 % — ABNORMAL HIGH (ref 4.8–5.6)
Mean Plasma Glucose: 160 mg/dL

## 2022-10-31 LAB — GLUCOSE, CAPILLARY
Glucose-Capillary: 126 mg/dL — ABNORMAL HIGH (ref 70–99)
Glucose-Capillary: 189 mg/dL — ABNORMAL HIGH (ref 70–99)
Glucose-Capillary: 82 mg/dL (ref 70–99)
Glucose-Capillary: 130 mg/dL — ABNORMAL HIGH (ref 70–99)

## 2022-10-31 LAB — BRAIN NATRIURETIC PEPTIDE: B Natriuretic Peptide: 1268.7 pg/mL — ABNORMAL HIGH (ref 0.0–100.0)

## 2022-10-31 LAB — MAGNESIUM: Magnesium: 2.5 mg/dL — ABNORMAL HIGH (ref 1.7–2.4)

## 2022-10-31 MED ORDER — FUROSEMIDE 40 MG PO TABS
40.0000 mg | ORAL_TABLET | Freq: Every day | ORAL | Status: DC
  Administered 2022-10-31 – 2022-11-01 (×2): 40 mg via ORAL
  Filled 2022-10-31 (×2): qty 1

## 2022-10-31 MED ORDER — SENNOSIDES-DOCUSATE SODIUM 8.6-50 MG PO TABS
1.0000 | ORAL_TABLET | Freq: Two times a day (BID) | ORAL | Status: DC
  Administered 2022-10-31 – 2022-11-01 (×3): 1 via ORAL
  Filled 2022-10-31 (×3): qty 1

## 2022-10-31 MED ORDER — POLYETHYLENE GLYCOL 3350 17 G PO PACK
17.0000 g | PACK | Freq: Every day | ORAL | Status: DC
  Administered 2022-10-31: 17 g via ORAL
  Filled 2022-10-31 (×2): qty 1

## 2022-10-31 NOTE — Progress Notes (Signed)
Placed hat in pt's toilet, & informed to call staff after each time she urinates. Verbalized understanding. RN updated NT also

## 2022-10-31 NOTE — Progress Notes (Signed)
In addition to at least two other times unmeasured. Started measuring with hat in toilet, NT informed

## 2022-10-31 NOTE — Progress Notes (Signed)
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  On presentation she was hypertensive, tachycardic.  Lab work did not show any leukocytosis or anemia but showed potassium of 3.4, elevated lactic acid at 2.6, magnesium of 1.6.  UA was suspicious for UTI.  Urine culture sent.  Admitted for further management.   Assessment & Plan:  Principal Problem:   Acute cystitis without hematuria Active Problems:   Type 2 diabetes mellitus (HCC)   CHF (congestive heart failure) (HCC)   QT prolongation   Hypertensive urgency   Hypokalemia   Hypomagnesemia   Cough  Sepsis secondary to acute cystitis: Presented with fever, chills, lower abdominal pain, discomfort, urinary urgency/frequency, nausea, vomiting.  UA strongly/due to UTI.  Urine culture now showing staph lugdunensis.  Continue ceftriaxone for today.  Patient still has the symptoms so will continue IV antibiotics for now. Initial lactic acid was elevated which has resolved.  No leukocytosis.  Hypertensive urgency: She will hypertensive on presentation.  Started on home medication but blood pressure soft today.  Will continue to monitor.  Proctitis: CT showed increased stool in the rectum with associated inferior rectal wall thickening with suspicion of proctitis.  Patient does not have any rectal pain.  Hypokalemia/hypomagnesemia/prolonged QT: Potassium and Magnesium supplemented.Will check new EKG today  Cough: She is a smoker , 1 pack lasts for 3 days.  Last smoking 2 weeks ago.  Lungs are clear to  auscultation.  COVID/influenza negative.  Continue current medications  Combined systolic/diastolic CHF: No signs of volume overload  On Entresto.  She will elevated BNP on presentation, which has improved.  Echo showed EF of 45 to 50%, grade 1 diastolic function.  Will recommend follow-up with cardiology as an outpatient.  Also takes Imdur, metoprolol,lasix daily, Tradjenta, spironolactone  Stage II/IIIa CKD: Currently kidney function at baseline.  Coronary artery  disease: No anginal symptoms.  Denies chest pain  Hyperlipidemia: Continue Lipitor  Rheumatoid arthritis: Has severe deformity of hands.  Follows with rheumatology.On leflunomide          DVT prophylaxis:enoxaparin (LOVENOX) injection 40 mg Start: 10/30/22 1000     Code Status: Full Code  Family Communication: None at bedside  Patient status:Inpatient  Patient is from :Home  Anticipated discharge YP:PJKD  Estimated DC date:tomorrow   Consultants: None  Procedures:None  Antimicrobials:  Anti-infectives (From admission, onward)    Start     Dose/Rate Route Frequency Ordered Stop   10/30/22 2000  cefTRIAXone (ROCEPHIN) 2 g in sodium chloride 0.9 % 100 mL IVPB  Status:  Discontinued        2 g 200 mL/hr over 30 Minutes Intravenous Every 24 hours 10/30/22 0549 10/30/22 0709   10/30/22 2000  cefTRIAXone (ROCEPHIN) 1 g in sodium chloride 0.9 % 100 mL IVPB        1 g 200 mL/hr over 30 Minutes Intravenous Every 24 hours 10/30/22 0709     10/29/22 2000  cefTRIAXone (ROCEPHIN) 2 g in sodium chloride 0.9 % 100 mL IVPB        2 g 200 mL/hr over 30 Minutes Intravenous  Once 10/29/22 1946 10/29/22  2057       Subjective: Patient seen and examined at bedside today.  Hemodynamically stable.  Still has some lower abdominal discomfort today.  Patient said the discomfort radiates to her sides.  No nausea or vomiting or fever.  No pain on defecation.  Objective: Vitals:   10/30/22 0927 10/30/22 1210 10/30/22 2032  10/31/22 0616  BP: (!) 149/79 (!) 152/68 132/68 (!) 96/58  Pulse: 94 94 79 78  Resp: 20 19 18 18   Temp: 98.1 F (36.7 C) 98 F (36.7 C) 97.6 F (36.4 C) 98 F (36.7 C)  TempSrc: Oral Oral Oral   SpO2: 96% 96% 97% 93%  Weight:      Height:        Intake/Output Summary (Last 24 hours) at 10/31/2022 0934 Last data filed at 10/31/2022 0300 Gross per 24 hour  Intake 580 ml  Output --  Net 580 ml   Filed Weights   10/30/22 0112  Weight: 65.8 kg    Examination:  General exam: Overall comfortable, not in distress HEENT: PERRL Respiratory system:  no wheezes or crackles  Cardiovascular system: S1 & S2 heard, RRR.  Gastrointestinal system: Abdomen is nondistended, soft and nontender. Central nervous system: Alert and oriented Extremities: No edema, no clubbing ,no cyanosis Skin: No rashes, no ulcers,no icterus     Data Reviewed: I have personally reviewed following labs and imaging studies  CBC: Recent Labs  Lab 10/29/22 1500 10/31/22 0745  WBC 9.9 8.3  HGB 14.6 12.4  HCT 45.6 39.8  MCV 89.9 92.3  PLT 172 160   Basic Metabolic Panel: Recent Labs  Lab 10/29/22 1500 10/30/22 0010 10/30/22 0648 10/31/22 0745  NA 136  --  136 133*  K 3.4*  --  4.4 4.0  CL 95*  --  103 105  CO2 23  --  22 21*  GLUCOSE 276*  --  184* 136*  BUN 14  --  18 22  CREATININE 0.85  --  1.08* 1.15*  CALCIUM 10.0  --  8.6* 8.4*  MG  --  1.6*  --   --      Recent Results (from the past 240 hour(s))  Resp panel by RT-PCR (RSV, Flu A&B, Covid) Anterior Nasal Swab     Status: None   Collection Time: 10/29/22  4:27 PM   Specimen: Anterior Nasal Swab  Result Value Ref Range Status   SARS Coronavirus 2 by RT PCR NEGATIVE NEGATIVE Final    Comment: (NOTE) SARS-CoV-2 target nucleic acids are NOT DETECTED.  The SARS-CoV-2 RNA is generally detectable in upper respiratory specimens during the acute phase of infection. The lowest concentration of SARS-CoV-2 viral copies this assay can  detect is 138 copies/mL. A negative result does not preclude SARS-Cov-2 infection and should not be used as the sole basis for treatment or other patient management decisions. A negative result may occur with  improper specimen collection/handling, submission of specimen other than nasopharyngeal swab, presence of viral mutation(s) within the areas targeted by this assay, and inadequate number of viral copies(<138 copies/mL). A negative result must be combined with clinical observations, patient history, and epidemiological information. The expected result is Negative.  Fact Sheet for Patients:  14/11/23  Fact Sheet for Healthcare Providers:  BloggerCourse.com  This test is no t yet approved or cleared by the [place] FDA and  has been authorized for detection and/or diagnosis of SARS-CoV-2 by FDA under an Emergency Use Authorization (EUA). This EUA will remain  in  effect (meaning this test can be used) for the duration of the COVID-19 declaration under Section 564(b)(1) of the Act, 21 U.S.C.section 360bbb-3(b)(1), unless the authorization is terminated  or revoked sooner.       Influenza A by PCR NEGATIVE NEGATIVE Final   Influenza B by PCR NEGATIVE NEGATIVE Final    Comment: (NOTE) The Xpert Xpress SARS-CoV-2/FLU/RSV plus assay is intended as an aid in the diagnosis of influenza from Nasopharyngeal swab specimens and should not be used as a sole basis for treatment. Nasal washings and aspirates are unacceptable for Xpert Xpress SARS-CoV-2/FLU/RSV testing.  Fact Sheet for Patients: BloggerCourse.com  Fact Sheet for Healthcare Providers: [employer]ious[employer]ker[employer]it  This test is not yet approved or cleared by the [place] FDA and has been authorized for detection and/or diagnosis of SARS-CoV-2 by FDA under an Emergency Use Authorization (EUA). This EUA will remain in  effect (meaning this test can be used) for the duration of the COVID-19 declaration under Section 564(b)(1) of the Act, 21 U.S.C. section 360bbb-3(b)(1), unless the authorization is terminated or revoked.     Resp Syncytial Virus by PCR NEGATIVE NEGATIVE Final    Comment: (NOTE) Fact Sheet for Patients: BloggerCourse.com  Fact Sheet for Healthcare Providers: [employer]  This test is not yet approved or cleared by the [place] FDA and has been authorized for detection and/or diagnosis of SARS-CoV-2 by FDA under an Emergency Use Authorization (EUA). This EUA will remain in effect (meaning this test can be used) for the duration of the COVID-19 declaration under Section 564(b)(1) of the Act, 21 U.S.C. section 360bbb-3(b)(1), unless the authorization is terminated or revoked.  Performed at [facility], [address] [place], [place] [address]   Urine Culture     Status: Abnormal   Collection Time: 10/29/22  7:30 PM   Specimen: Urine, Clean Catch  Result Value Ref Range Status   Specimen Description   Final    URINE, CLEAN CATCH Performed at [facility], [address] [place], [place] [address]    Special Requests   Final    NONE Performed at [facility], [address] [place], [place] [address]    Culture >=100,000 COLONIES/mL STAPHYLOCOCCUS LUGDUNENSIS (A)  Final   Report Status 10/31/2022 FINAL  Final   Organism ID, Bacteria STAPHYLOCOCCUS LUGDUNENSIS (A)  Final      Susceptibility   Staphylococcus lugdunensis - MIC*    CIPROFLOXACIN <=0.5 SENSITIVE Sensitive     GENTAMICIN <=0.5 SENSITIVE Sensitive     NITROFURANTOIN <=16 SENSITIVE Sensitive     OXACILLIN 2 SENSITIVE Sensitive     TETRACYCLINE <=1 SENSITIVE Sensitive     VANCOMYCIN 1 SENSITIVE Sensitive     TRIMETH/SULFA <=10 SENSITIVE Sensitive     CLINDAMYCIN <=0.25 SENSITIVE Sensitive      RIFAMPIN <=0.5 SENSITIVE Sensitive     Inducible Clindamycin NEGATIVE Sensitive     * >=100,000 COLONIES/mL STAPHYLOCOCCUS LUGDUNENSIS  Blood culture (routine x 2)     Status: None (Preliminary result)   Collection Time: 10/29/22  8:06 PM   Specimen: BLOOD  Result Value Ref Range Status   Specimen Description   Final    BLOOD BLOOD RIGHT HAND Performed at [facility], [address] [place], [place] [address]    Special Requests   Final    BOTTLES DRAWN AEROBIC AND ANAEROBIC Blood Culture adequate volume Performed at [facility], [address] [place], [place] [address]    Culture   Final  NO GROWTH < 24 HOURS Performed at [facility], [address], [place], [place] [address]    Report Status PENDING  Incomplete  Blood culture (routine x 2)     Status: None (Preliminary result)   Collection Time: 10/29/22  8:11 PM   Specimen: BLOOD  Result Value Ref Range Status   Specimen Description   Final    BLOOD BLOOD LEFT WRIST Performed at [facility], [address], [place], [place] [address]    Special Requests   Final    BOTTLES DRAWN AEROBIC AND ANAEROBIC Blood Culture adequate volume Performed at [facility], [address] [place], [place] [address]    Culture   Final    NO GROWTH < 24 HOURS Performed at [facility], [address] [place], [place] [address]    Report Status PENDING  Incomplete     Radiology Studies: ECHOCARDIOGRAM COMPLETE  Result Date: 10/30/2022    ECHOCARDIOGRAM REPORT   Patient Name:   Maria Foster Date of Exam: 10/30/2022 Medical Rec #:  [id number]       Height:       63.0 in Accession #:    [id number]      Weight:       145.1 lb Date of Birth:  [birth year]       BSA:          1.687 m Patient Age:    66 years        BP:           152/68 mmHg Patient Gender: F               HR:           88 bpm. Exam Location:  Inpatient Procedure: 2D Echo, Cardiac  Doppler and Color Doppler Indications:    Cardiomyopathy  History:        Patient has prior history of Echocardiogram examinations, most                 recent 10/09/2021. CHF, CAD, Mitral Valve Disease; Risk                 Factors:Diabetes, Hypertension, Dyslipidemia, Sleep Apnea and                 Current Smoker.  Sonographer:    Milda Smart Referring Phys: [phone] [facility]  Sonographer Comments: Image acquisition challenging due to patient body habitus and Image acquisition challenging due to respiratory motion. IMPRESSIONS  1. Left ventricular ejection fraction, by estimation, is 45 to 50%. The left ventricle has mildly decreased function. The left ventricle demonstrates global hypokinesis. Left ventricular diastolic parameters are consistent with Grade I diastolic dysfunction (impaired relaxation).  2. Right ventricular systolic function is normal. The right ventricular size is normal. Tricuspid regurgitation signal is inadequate for assessing PA pressure.  3. The mitral valve is rheumatic. Mild to moderate mitral valve regurgitation. No evidence of mitral stenosis.  4. The aortic valve is tricuspid. There is mild calcification of the aortic valve. Aortic valve regurgitation is mild. Aortic valve sclerosis is present, with no evidence of aortic valve stenosis.  5. The inferior vena cava is normal in size with greater than 50% respiratory variability, suggesting right atrial pressure of 3 mmHg. Comparison(s): LVEF has improved from prior. FINDINGS  Left Ventricle: Left ventricular ejection fraction, by estimation, is 45 to 50%. The left ventricle has mildly decreased function. The left ventricle demonstrates global hypokinesis.  The left ventricular internal cavity size was normal in size. There is  no left ventricular hypertrophy. Left ventricular diastolic parameters are consistent with Grade I diastolic dysfunction (impaired relaxation). Right Ventricle: The right ventricular size is normal. No  increase in right ventricular wall thickness. Right ventricular systolic function is normal. Tricuspid regurgitation signal is inadequate for assessing PA pressure. Left Atrium: Left atrial size was normal in size. Right Atrium: Right atrial size was normal in size. Pericardium: There is no evidence of pericardial effusion. Mitral Valve: The mitral valve is rheumatic. Mild to moderate mitral valve regurgitation. No evidence of mitral valve stenosis. Tricuspid Valve: The tricuspid valve is normal in structure. Tricuspid valve regurgitation is not demonstrated. No evidence of tricuspid stenosis. Aortic Valve: The aortic valve is tricuspid. There is mild calcification of the aortic valve. Aortic valve regurgitation is mild. Aortic regurgitation PHT measures 336 msec. Aortic valve sclerosis is present, with no evidence of aortic valve stenosis. Pulmonic Valve: The pulmonic valve was not well visualized. Pulmonic valve regurgitation is not visualized. No evidence of pulmonic stenosis. Aorta: The aortic root and ascending aorta are structurally normal, with no evidence of dilitation. Venous: The inferior vena cava is normal in size with greater than 50% respiratory variability, suggesting right atrial pressure of 3 mmHg. IAS/Shunts: No atrial level shunt detected by color flow Doppler.  LEFT VENTRICLE PLAX 2D LVIDd:         4.50 cm     Diastology LVIDs:         3.40 cm     LV e' medial:    3.90 cm/s LV PW:         1.00 cm     LV E/e' medial:  24.2 LV IVS:        1.00 cm     LV e' lateral:   4.58 cm/s LVOT diam:     2.00 cm     LV E/e' lateral: 20.6 LV SV:         62 LV SV Index:   37 LVOT Area:     3.14 cm  LV Volumes (MOD) LV vol d, MOD A2C: 92.9 ml LV vol d, MOD A4C: 96.2 ml LV vol s, MOD A2C: 45.6 ml LV vol s, MOD A4C: 46.3 ml LV SV MOD A2C:     47.3 ml LV SV MOD A4C:     96.2 ml LV SV MOD BP:      49.1 ml RIGHT VENTRICLE             IVC RV S prime:     10.90 cm/s  IVC diam: 1.30 cm TAPSE (M-mode): 1.6 cm LEFT ATRIUM              Index        RIGHT ATRIUM          Index LA diam:        3.40 cm 2.02 cm/m   RA Area:     9.60 cm LA Vol (A2C):   54.1 ml 32.07 ml/m  RA Volume:   18.50 ml 10.97 ml/m LA Vol (A4C):   44.6 ml 26.44 ml/m LA Biplane Vol: 50.8 ml 30.11 ml/m  AORTIC VALVE LVOT Vmax:   113.00 cm/s LVOT Vmean:  77.800 cm/s LVOT VTI:    0.198 m AI PHT:      336 msec  AORTA Ao Root diam: 2.80 cm Ao Asc diam:  3.20 cm MITRAL VALVE MV Area (PHT): 2.26 cm  SHUNTS MV Decel Time: 336 msec     Systemic VTI:  0.20 m MR Peak grad: 134.6 mmHg    Systemic Diam: 2.00 cm MR Mean grad: 88.0 mmHg MR Vmax:      580.00 cm/s MR Vmean:     449.0 cm/s MV E velocity: 94.50 cm/s MV A velocity: 131.00 cm/s MV E/A ratio:  0.72 Riley Lam MD Electronically signed by Riley Lam MD Signature Date/Time: 10/30/2022/2:05:13 PM    Final    CT ABDOMEN PELVIS W CONTRAST  Result Date: 10/29/2022 CLINICAL DATA:  Abdominal, pelvic, and flank pain since last night worse today, nausea, vomiting, history diabetes mellitus EXAM: CT ABDOMEN AND PELVIS WITH CONTRAST TECHNIQUE: Multidetector CT imaging of the abdomen and pelvis was performed using the standard protocol following bolus administration of intravenous contrast. RADIATION DOSE REDUCTION: This exam was performed according to the departmental dose-optimization program which includes automated exposure control, adjustment of the mA and/or kV according to patient size and/or use of iterative reconstruction technique. CONTRAST:  80mL OMNIPAQUE IOHEXOL 300 MG/ML SOLN IV. No oral contrast. COMPARISON:  11/29/2016 FINDINGS: Lower chest: Lung bases clear Hepatobiliary: Focal fatty infiltration of liver adjacent to falciform fissure, unchanged. Gallbladder and liver otherwise normal appearance Pancreas: Normal appearance Spleen: Normal appearance Adrenals/Urinary Tract: Adrenal glands normal appearance. Cortical thinning anterior mid LEFT kidney. Kidneys otherwise normal appearance  without mass or hydronephrosis. No hydronephrosis, hydroureter, or urinary tract calcification. Bladder unremarkable. Stomach/Bowel: Air-filled appendix in RIGHT pelvis, normal. Stomach normal appearance. Increased stool in rectum with associated inferior rectal wall thickening question proctitis. Remaining bowel loops otherwise normal appearance. Vascular/Lymphatic: Atherosclerotic calcifications aorta and iliac arteries without aneurysm. Scattered normal size inguinal lymph nodes. No definite abdominal or pelvic adenopathy. Reproductive: Unremarkable uterus and adnexa Other: No free air or free fluid.  No hernia. Musculoskeletal: Unremarkable IMPRESSION: Increased stool in rectum with associated inferior rectal wall thickening question proctitis. No other acute intra-abdominal or intrapelvic abnormalities. Aortic Atherosclerosis (ICD10-I70.0). Electronically Signed   By: Ulyses Southward M.D.   On: 10/29/2022 17:27    Scheduled Meds:  aspirin EC  81 mg Oral Q M,W,F   atorvastatin  40 mg Oral Daily   enoxaparin (LOVENOX) injection  40 mg Subcutaneous Q24H   hydrALAZINE  50 mg Oral Q8H   insulin aspart  0-5 Units Subcutaneous QHS   insulin aspart  0-9 Units Subcutaneous TID WC   isosorbide mononitrate  60 mg Oral Daily   leflunomide  20 mg Oral QHS   metoprolol succinate  50 mg Oral Daily   sacubitril-valsartan  1 tablet Oral BID   Continuous Infusions:  cefTRIAXone (ROCEPHIN)  IV 1 g (10/30/22 2042)     LOS: 0 days   Burnadette Pop, MD [facility] P12/13/2023, 9:34 AM

## 2022-10-31 NOTE — Progress Notes (Addendum)
BP re-check from previous. Notified Dr. Renford Dills  10/31/22 1008  Vitals  Temp 97.7 F (36.5 C)  Temp Source Oral  BP 126/67  MAP (mmHg) 85  BP Location Left Arm  BP Method Automatic  Patient Position (if appropriate) Lying  Pulse Rate 69  Pulse Rate Source Monitor  ECG Heart Rate 70  Resp 13  MEWS COLOR  MEWS Score Color Green  Oxygen Therapy  SpO2 97 %  O2 Device Room Air  MEWS Score  MEWS Temp 0  MEWS Systolic 0  MEWS Pulse 0  MEWS RR 1  MEWS LOC 0  MEWS Score 1   Per Dr. Renford Dills, "BP is good,we can hold metoprolol ,imdur and entresto" Held in [facility], noted per attending in secure chat

## 2022-10-31 NOTE — Progress Notes (Signed)
Mobility Specialist - Progress Note   10/31/22 1552  Mobility  Activity Ambulated independently in hallway  Level of Assistance Independent  Assistive Device None  Distance Ambulated (ft) 350 ft  Activity Response Tolerated well  Mobility Referral Yes  $Mobility charge 1 Mobility   Pt received in bed and agreeable to mobility. Pt had some LOB which was corrected with the gait belt. Pt stated her LOB was due to not getting up all day. No complaints during mobility. Pt to EOB after session with all needs met.   [facility]

## 2022-11-01 LAB — BASIC METABOLIC PANEL
Anion gap: 8 (ref 5–15)
BUN: 20 mg/dL (ref 8–23)
CO2: 23 mmol/L (ref 22–32)
Calcium: 8.9 mg/dL (ref 8.9–10.3)
Chloride: 105 mmol/L (ref 98–111)
Creatinine, Ser: 1.05 mg/dL — ABNORMAL HIGH (ref 0.44–1.00)
GFR, Estimated: 59 mL/min — ABNORMAL LOW (ref >60)
Glucose, Bld: 135 mg/dL — ABNORMAL HIGH (ref 70–99)
Potassium: 3.8 mmol/L (ref 3.5–5.1)
Sodium: 136 mmol/L (ref 135–145)

## 2022-11-01 LAB — GLUCOSE, CAPILLARY: Glucose-Capillary: 124 mg/dL — ABNORMAL HIGH (ref 70–99)

## 2022-11-01 MED ORDER — CEPHALEXIN 500 MG PO CAPS: 500.0000 mg | ORAL_CAPSULE | Freq: Three times a day (TID) | ORAL | 0 refills | Status: AC

## 2022-11-01 NOTE — TOC Transition Note (Signed)
Transition of Care [facility]) - CM/SW Discharge Note   Patient Details  Name: Maria Foster MRN: [id number] Date of Birth: 1956/06/23  [facility] CM/SW Contact:  Golda Acre, RN Phone Number: 11/01/2022, 9:40 AM   Clinical Narrative:    [id number]/patient discharged to return home.  Chart reviewed for TOC needs.  None found.  Patient self care.   Final next level of care: Home/Self Care Barriers to Discharge: Barriers Resolved   Patient Goals and CMS Choice Patient states their goals for this hospitalization and ongoing recovery are:: to go home CMS Medicare.gov Compare Post Acute Care list provided to:: Patient Choice offered to / list presented to : Patient  Discharge Placement                       Discharge Plan and Services   Discharge Planning Services: CM Consult                                 Social Determinants of Health (SDOH) Interventions Housing Interventions: Intervention Not Indicated   Readmission Risk Interventions   No data to display

## 2022-11-01 NOTE — Discharge Summary (Signed)
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  On presentation she was hypertensive, tachycardic.  Lab work did not show any leukocytosis or anemia but showed potassium of 3.4, elevated lactic acid at 2.6, magnesium of 1.6.  UA was suspicious for UTI.  Urine culture sent and it showed staph lugdunensis.  Blood cultures have been negative.  Patient feels much better today.  Medically stable for discharge.  Following problems were addressed during her hospitalization:  Sepsis secondary to acute cystitis: Presented with fever, chills, lower abdominal pain, discomfort, urinary urgency/frequency, nausea, vomiting.  UA strongly suggestive of  UTI.  Urine culture showed pan sensitive  staph lugdunensis. Initial lactic acid was elevated which has resolved.  No leukocytosis.  Blood cultures have not shown any growth.  Antibiotics changed to oral.   Hypertensive urgency: She will hypertensive on presentation.  Continue home medications.BP stable  Proctitis: CT showed increased stool in the rectum with associated inferior rectal wall thickening with suspicion of proctitis.  Patient does not have any rectal pain.No further work up   Hypokalemia/hypomagnesemia/prolonged QT: Potassium and Magnesium supplemented.New EKG today showed Qtc of 488   Cough: She is a smoker , 1 pack  lasts for 3 days.  Last smoking 2 weeks ago.  Lungs are clear to auscultation.  COVID/influenza negative.  Counseled for cessation   Combined systolic/diastolic CHF: No signs of volume overload . On Entresto.  She has elevated BNP on presentation, which has improved.  Echo showed EF of 45 to 50%, grade 1 diastolic function.  Will recommend follow-up with cardiology as an outpatient.  Also takes Imdur, metoprolol,lasix daily, Tradjenta, spironolactone   Stage II/IIIa CKD: Currently kidney function at baseline.   Coronary artery  disease: No anginal symptoms.  Denies chest pain   Hyperlipidemia: Continue Lipitor   Rheumatoid arthritis: Has severe deformity of hands.  Follows with rheumatology.On leflunomide    Discharge Diagnoses:  Principal Problem:   Acute cystitis without hematuria Active Problems:   Type 2 diabetes mellitus (HCC)   CHF (congestive heart failure) (HCC)   QT prolongation   Hypertensive urgency   Hypokalemia   Hypomagnesemia   Cough   Cystitis    Discharge Instructions  Discharge Instructions     Diet - low sodium heart healthy   Complete by: As directed    Discharge instructions   Complete by: As directed    1)Please take prescribed medication as instructed 2)Follow up with your PCP in a week   Increase activity slowly   Complete by: As directed       Allergies as of 11/01/2022       Reactions   Metformin And Related Other (See Comments)   Kidney injury, GI upset, diarrhea   Farxiga [dapagliflozin] Other (See Comments)   Yeast infections, recurrent        Medication List     STOP taking these medications    oseltamivir 75 MG capsule Commonly known as: Tamiflu  TAKE these medications    acetaminophen 325 MG tablet Commonly known as: TYLENOL Take 1-2 tablets (325-650 mg total) by mouth every 6 (six) hours as needed for mild pain (pain score 1-3 or temp > 100.5). What changed:  how much to take when to take this reasons to  take this   aspirin EC 81 MG tablet Take 81 mg by mouth every Monday, Wednesday, and Friday.   atorvastatin 40 MG tablet Commonly known as: LIPITOR Take 1 tablet (40 mg total) by mouth daily.   cephALEXin 500 MG capsule Commonly known as: KEFLEX Take 1 capsule (500 mg total) by mouth 3 (three) times daily for 5 days.   Entresto 97-103 MG Generic drug: sacubitril-valsartan TAKE 1 TABLET BY MOUTH TWICE DAILY What changed: Another medication with the same name was changed. Make sure you understand how and when to take each.   Entresto 97-103 MG Generic drug: sacubitril-valsartan TAKE 1 TABLET BY MOUTH TWICE DAILY. NEED FOLLOW UP APPOINTMENT FOR ANYMORE REFILLS What changed: See the new instructions.   ferrous gluconate 324 MG tablet Commonly known as: FERGON TAKE 1 TABLET(324 MG) BY MOUTH TWICE DAILY WITH A MEAL What changed: See the new instructions.   furosemide 40 MG tablet Commonly known as: LASIX Take 1 tablet (40 mg total) by mouth daily. What changed: how much to take   guaiFENesin 600 MG 12 hr tablet Commonly known as: Mucinex Take 1 tablet (600 mg total) by mouth 2 (two) times daily.   hydrALAZINE 50 MG tablet Commonly known as: APRESOLINE Take 1 tablet (50 mg total) by mouth every 8 (eight) hours. What changed: when to take this   isosorbide mononitrate 60 MG 24 hr tablet Commonly known as: IMDUR TAKE 1 TABLET(60 MG) BY MOUTH DAILY What changed: See the new instructions.   latanoprost 0.005 % ophthalmic solution Commonly known as: XALATAN Place 1 drop into both eyes at bedtime.   leflunomide 20 MG tablet Commonly known as: ARAVA Take 1 tablet (20 mg total) by mouth daily. What changed: when to take this   metoprolol succinate 50 MG 24 hr tablet Commonly known as: TOPROL-XL TAKE 1 TABLET(50 MG) BY MOUTH DAILY WITH OR IMMEDIATELY FOLLOWING A MEAL What changed: See the new instructions.   ondansetron 4 MG tablet Commonly known as: Zofran Take 1  tablet (4 mg total) by mouth every 8 (eight) hours as needed for nausea or vomiting. What changed: when to take this   polyethylene glycol 17 g packet Commonly known as: MIRALAX / GLYCOLAX Take 17 g by mouth daily as needed for mild constipation.   potassium chloride SA 20 MEQ tablet Commonly known as: KLOR-CON M Take 2 tablets (40 mEq total) by mouth 2 (two) times daily.   spironolactone 25 MG tablet Commonly known as: ALDACTONE TAKE 1 TABLET(25 MG) BY MOUTH DAILY What changed: See the new instructions.   Tradjenta 5 MG Tabs tablet Generic drug: linagliptin TAKE 1 TABLET(5 MG) BY MOUTH DAILY What changed: See the new instructions.   Vitamin D (Ergocalciferol) 1.25 MG (50000 UNIT) Caps capsule Commonly known as: DRISDOL Take 50,000 Units by mouth every Friday.        Allergies  Allergen Reactions   Metformin And Related Other (See Comments)    Kidney injury, GI upset, diarrhea   Farxiga [Dapagliflozin] Other (See Comments)    Yeast infections, recurrent    Consultations: None   Procedures/Studies: ECHOCARDIOGRAM COMPLETE  Result Date: 10/30/2022    ECHOCARDIOGRAM REPORT   Patient Name:  [name] Date of Exam: 10/30/2022 Medical Rec #:  [id number]       Height:       63.0 in Accession #:    [id number]      Weight:       145.1 lb Date of Birth:  08-14-1956       BSA:          1.687 m Patient Age:    66 years        BP:           152/68 mmHg Patient Gender: F               HR:           88 bpm. Exam Location:  Inpatient Procedure: 2D Echo, Cardiac Doppler and Color Doppler Indications:    Cardiomyopathy  History:        Patient has prior history of Echocardiogram examinations, most                 recent 10/09/2021. CHF, CAD, Mitral Valve Disease; Risk                 Factors:Diabetes, Hypertension, Dyslipidemia, Sleep Apnea and                 Current Smoker.  Sonographer:    Milda Smart Referring Phys: [phone] [facility]  Sonographer Comments: Image acquisition  challenging due to patient body habitus and Image acquisition challenging due to respiratory motion. IMPRESSIONS  1. Left ventricular ejection fraction, by estimation, is 45 to 50%. The left ventricle has mildly decreased function. The left ventricle demonstrates global hypokinesis. Left ventricular diastolic parameters are consistent with Grade I diastolic dysfunction (impaired relaxation).  2. Right ventricular systolic function is normal. The right ventricular size is normal. Tricuspid regurgitation signal is inadequate for assessing PA pressure.  3. The mitral valve is rheumatic. Mild to moderate mitral valve regurgitation. No evidence of mitral stenosis.  4. The aortic valve is tricuspid. There is mild calcification of the aortic valve. Aortic valve regurgitation is mild. Aortic valve sclerosis is present, with no evidence of aortic valve stenosis.  5. The inferior vena cava is normal in size with greater than 50% respiratory variability, suggesting right atrial pressure of 3 mmHg. Comparison(s): LVEF has improved from prior. FINDINGS  Left Ventricle: Left ventricular ejection fraction, by estimation, is 45 to 50%. The left ventricle has mildly decreased function. The left ventricle demonstrates global hypokinesis. The left ventricular internal cavity size was normal in size. There is  no left ventricular hypertrophy. Left ventricular diastolic parameters are consistent with Grade I diastolic dysfunction (impaired relaxation). Right Ventricle: The right ventricular size is normal. No increase in right ventricular wall thickness. Right ventricular systolic function is normal. Tricuspid regurgitation signal is inadequate for assessing PA pressure. Left Atrium: Left atrial size was normal in size. Right Atrium: Right atrial size was normal in size. Pericardium: There is no evidence of pericardial effusion. Mitral Valve: The mitral valve is rheumatic. Mild to moderate mitral valve regurgitation. No evidence of  mitral valve stenosis. Tricuspid Valve: The tricuspid valve is normal in structure. Tricuspid valve regurgitation is not demonstrated. No evidence of tricuspid stenosis. Aortic Valve: The aortic valve is tricuspid. There is mild calcification of the aortic valve. Aortic valve regurgitation is mild. Aortic regurgitation PHT measures 336 msec. Aortic valve sclerosis is present, with no evidence of aortic valve stenosis. Pulmonic Valve: The pulmonic valve was not well visualized. Pulmonic valve  regurgitation is not visualized. No evidence of pulmonic stenosis. Aorta: The aortic root and ascending aorta are structurally normal, with no evidence of dilitation. Venous: The inferior vena cava is normal in size with greater than 50% respiratory variability, suggesting right atrial pressure of 3 mmHg. IAS/Shunts: No atrial level shunt detected by color flow Doppler.  LEFT VENTRICLE PLAX 2D LVIDd:         4.50 cm     Diastology LVIDs:         3.40 cm     LV e' medial:    3.90 cm/s LV PW:         1.00 cm     LV E/e' medial:  24.2 LV IVS:        1.00 cm     LV e' lateral:   4.58 cm/s LVOT diam:     2.00 cm     LV E/e' lateral: 20.6 LV SV:         62 LV SV Index:   37 LVOT Area:     3.14 cm  LV Volumes (MOD) LV vol d, MOD A2C: 92.9 ml LV vol d, MOD A4C: 96.2 ml LV vol s, MOD A2C: 45.6 ml LV vol s, MOD A4C: 46.3 ml LV SV MOD A2C:     47.3 ml LV SV MOD A4C:     96.2 ml LV SV MOD BP:      49.1 ml RIGHT VENTRICLE             IVC RV S prime:     10.90 cm/s  IVC diam: 1.30 cm TAPSE (M-mode): 1.6 cm LEFT ATRIUM             Index        RIGHT ATRIUM          Index LA diam:        3.40 cm 2.02 cm/m   RA Area:     9.60 cm LA Vol (A2C):   54.1 ml 32.07 ml/m  RA Volume:   18.50 ml 10.97 ml/m LA Vol (A4C):   44.6 ml 26.44 ml/m LA Biplane Vol: 50.8 ml 30.11 ml/m  AORTIC VALVE LVOT Vmax:   113.00 cm/s LVOT Vmean:  77.800 cm/s LVOT VTI:    0.198 m AI PHT:      336 msec  AORTA Ao Root diam: 2.80 cm Ao Asc diam:  3.20 cm MITRAL VALVE MV  Area (PHT): 2.26 cm     SHUNTS MV Decel Time: 336 msec     Systemic VTI:  0.20 m MR Peak grad: 134.6 mmHg    Systemic Diam: 2.00 cm MR Mean grad: 88.0 mmHg MR Vmax:      580.00 cm/s MR Vmean:     449.0 cm/s MV E velocity: 94.50 cm/s MV A velocity: 131.00 cm/s MV E/A ratio:  0.72 Riley Lam MD Electronically signed by Riley Lam MD Signature Date/Time: 10/30/2022/2:05:13 PM    Final    CT ABDOMEN PELVIS W CONTRAST  Result Date: 10/29/2022 CLINICAL DATA:  Abdominal, pelvic, and flank pain since last night worse today, nausea, vomiting, history diabetes mellitus EXAM: CT ABDOMEN AND PELVIS WITH CONTRAST TECHNIQUE: Multidetector CT imaging of the abdomen and pelvis was performed using the standard protocol following bolus administration of intravenous contrast. RADIATION DOSE REDUCTION: This exam was performed according to the departmental dose-optimization program which includes automated exposure control, adjustment of the mA and/or kV according to patient size and/or use of iterative reconstruction technique. CONTRAST:  60mL OMNIPAQUE IOHEXOL 300 MG/ML SOLN IV. No oral contrast. COMPARISON:  11/29/2016 FINDINGS: Lower chest: Lung bases clear Hepatobiliary: Focal fatty infiltration of liver adjacent to falciform fissure, unchanged. Gallbladder and liver otherwise normal appearance Pancreas: Normal appearance Spleen: Normal appearance Adrenals/Urinary Tract: Adrenal glands normal appearance. Cortical thinning anterior mid LEFT kidney. Kidneys otherwise normal appearance without mass or hydronephrosis. No hydronephrosis, hydroureter, or urinary tract calcification. Bladder unremarkable. Stomach/Bowel: Air-filled appendix in RIGHT pelvis, normal. Stomach normal appearance. Increased stool in rectum with associated inferior rectal wall thickening question proctitis. Remaining bowel loops otherwise normal appearance. Vascular/Lymphatic: Atherosclerotic calcifications aorta and iliac arteries  without aneurysm. Scattered normal size inguinal lymph nodes. No definite abdominal or pelvic adenopathy. Reproductive: Unremarkable uterus and adnexa Other: No free air or free fluid.  No hernia. Musculoskeletal: Unremarkable IMPRESSION: Increased stool in rectum with associated inferior rectal wall thickening question proctitis. No other acute intra-abdominal or intrapelvic abnormalities. Aortic Atherosclerosis (ICD10-I70.0). Electronically Signed   By: Ulyses Southward M.D.   On: 10/29/2022 17:27      Subjective:  Patient seen and examined at bedside today.  Hemodynamically stable for discharge.  Denies any abdominal pain, nausea or vomiting today. Discharge Exam: Vitals:   10/31/22 1953 11/01/22 0507  BP: 137/72 (!) 152/78  Pulse: 84 76  Resp: 20 20  Temp: 97.8 F (36.6 C) 98.1 F (36.7 C)  SpO2: 97% 96%   Vitals:   10/31/22 1321 10/31/22 1600 10/31/22 1953 11/01/22 0507  BP: (!) 142/83  137/72 (!) 152/78  Pulse: 80  84 76  Resp: 20 (!) 22 20 20   Temp: 97.8 F (36.6 C)  97.8 F (36.6 C) 98.1 F (36.7 C)  TempSrc: Oral  Oral Oral  SpO2: 100%  97% 96%  Weight:      Height:        General: Pt is alert, awake, not in acute distress Cardiovascular: RRR, S1/S2 +, no rubs, no gallops Respiratory: CTA bilaterally, no wheezing, no rhonchi Abdominal: Soft, NT, ND, bowel sounds + Extremities: no edema, no cyanosis    The results of significant diagnostics from this hospitalization (including imaging, microbiology, ancillary and laboratory) are listed below for reference.     Microbiology: Recent Results (from the past 240 hour(s))  Resp panel by RT-PCR (RSV, Flu A&B, Covid) Anterior Nasal Swab     Status: None   Collection Time: 10/29/22  4:27 PM   Specimen: Anterior Nasal Swab  Result Value Ref Range Status   SARS Coronavirus 2 by RT PCR NEGATIVE NEGATIVE Final    Comment: (NOTE) SARS-CoV-2 target nucleic acids are NOT DETECTED.  The SARS-CoV-2 RNA is generally detectable  in upper respiratory specimens during the acute phase of infection. The lowest concentration of SARS-CoV-2 viral copies this assay can detect is 138 copies/mL. A negative result does not preclude SARS-Cov-2 infection and should not be used as the sole basis for treatment or other patient management decisions. A negative result may occur with  improper specimen collection/handling, submission of specimen other than nasopharyngeal swab, presence of viral mutation(s) within the areas targeted by this assay, and inadequate number of viral copies(<138 copies/mL). A negative result must be combined with clinical observations, patient history, and epidemiological information. The expected result is Negative.  Fact Sheet for Patients:  BloggerCourse.com  Fact Sheet for Healthcare Providers:  SeriousBroker.it  This test is no t yet approved or cleared by the [place] FDA and  has been authorized for detection and/or diagnosis of SARS-CoV-2 by FDA under  an Emergency Use Authorization (EUA). This EUA will remain  in effect (meaning this test can be used) for the duration of the COVID-19 declaration under Section 564(b)(1) of the Act, 21 U.S.C.section 360bbb-3(b)(1), unless the authorization is terminated  or revoked sooner.       Influenza A by PCR NEGATIVE NEGATIVE Final   Influenza B by PCR NEGATIVE NEGATIVE Final    Comment: (NOTE) The Xpert Xpress SARS-CoV-2/FLU/RSV plus assay is intended as an aid in the diagnosis of influenza from Nasopharyngeal swab specimens and should not be used as a sole basis for treatment. Nasal washings and aspirates are unacceptable for Xpert Xpress SARS-CoV-2/FLU/RSV testing.  Fact Sheet for Patients: BloggerCourse.com  Fact Sheet for Healthcare Providers: SeriousBroker.it  This test is not yet approved or cleared by the [place] FDA and has  been authorized for detection and/or diagnosis of SARS-CoV-2 by FDA under an Emergency Use Authorization (EUA). This EUA will remain in effect (meaning this test can be used) for the duration of the COVID-19 declaration under Section 564(b)(1) of the Act, 21 U.S.C. section 360bbb-3(b)(1), unless the authorization is terminated or revoked.     Resp Syncytial Virus by PCR NEGATIVE NEGATIVE Final    Comment: (NOTE) Fact Sheet for Patients: BloggerCourse.com  Fact Sheet for Healthcare Providers: SeriousBroker.it  This test is not yet approved or cleared by the [place] FDA and has been authorized for detection and/or diagnosis of SARS-CoV-2 by FDA under an Emergency Use Authorization (EUA). This EUA will remain in effect (meaning this test can be used) for the duration of the COVID-19 declaration under Section 564(b)(1) of the Act, 21 U.S.C. section 360bbb-3(b)(1), unless the authorization is terminated or revoked.  Performed at [facility], [address] [place], [place] [address]   Urine Culture     Status: Abnormal   Collection Time: 10/29/22  7:30 PM   Specimen: Urine, Clean Catch  Result Value Ref Range Status   Specimen Description   Final    URINE, CLEAN CATCH Performed at [facility], [address], [place], [place] [address]    Special Requests   Final    NONE Performed at [facility], [address] [place], [place] [address]    Culture >=100,000 COLONIES/mL STAPHYLOCOCCUS LUGDUNENSIS (A)  Final   Report Status 10/31/2022 FINAL  Final   Organism ID, Bacteria STAPHYLOCOCCUS LUGDUNENSIS (A)  Final      Susceptibility   Staphylococcus lugdunensis - MIC*    CIPROFLOXACIN <=0.5 SENSITIVE Sensitive     GENTAMICIN <=0.5 SENSITIVE Sensitive     NITROFURANTOIN <=16 SENSITIVE Sensitive     OXACILLIN 2 SENSITIVE Sensitive     TETRACYCLINE <=1 SENSITIVE  Sensitive     VANCOMYCIN 1 SENSITIVE Sensitive     TRIMETH/SULFA <=10 SENSITIVE Sensitive     CLINDAMYCIN <=0.25 SENSITIVE Sensitive     RIFAMPIN <=0.5 SENSITIVE Sensitive     Inducible Clindamycin NEGATIVE Sensitive     * >=100,000 COLONIES/mL STAPHYLOCOCCUS LUGDUNENSIS  Blood culture (routine x 2)     Status: None (Preliminary result)   Collection Time: 10/29/22  8:06 PM   Specimen: BLOOD  Result Value Ref Range Status   Specimen Description   Final    BLOOD BLOOD RIGHT HAND Performed at [facility], [address], [place], [place] [address]    Special Requests   Final    BOTTLES DRAWN AEROBIC AND ANAEROBIC Blood Culture adequate volume Performed at [facility], [address],  [place], [place] [address]    Culture   Final    NO GROWTH 2 DAYS Performed at [facility], [address] [place], [place] [address]    Report Status PENDING  Incomplete  Blood culture (routine x 2)     Status: None (Preliminary result)   Collection Time: 10/29/22  8:11 PM   Specimen: BLOOD  Result Value Ref Range Status   Specimen Description   Final    BLOOD BLOOD LEFT WRIST Performed at [facility], [address] [place], [place] [address]    Special Requests   Final    BOTTLES DRAWN AEROBIC AND ANAEROBIC Blood Culture adequate volume Performed at [facility], [address], [place], [place] [address]    Culture   Final    NO GROWTH 2 DAYS Performed at [facility], [address] [place], [place] [address]    Report Status PENDING  Incomplete     Labs: BNP (last 3 results) Recent Labs    10/30/22 0648 10/31/22 0745  BNP 3,799.8* 1,268.7*   Basic Metabolic Panel: Recent Labs  Lab 10/29/22 1500 10/30/22 0010 10/30/22 0648 10/31/22 0745 11/01/22 0352  NA 136  --  136 133* 136  K 3.4*  --  4.4 4.0 3.8  CL 95*  --  103 105 105  CO2 23  --  22 21* 23  GLUCOSE 276*  --  184* 136*  135*  BUN 14  --  18 22 20   CREATININE 0.85  --  1.08* 1.15* 1.05*  CALCIUM 10.0  --  8.6* 8.4* 8.9  MG  --  1.6*  --  2.5*  --    Liver Function Tests: Recent Labs  Lab 10/29/22 1500 10/30/22 0648  AST 14* 19  ALT 12 13  ALKPHOS 114 85  BILITOT 0.9 0.7  PROT 9.5* 7.7  ALBUMIN 4.7 3.4*   Recent Labs  Lab 10/29/22 1500  LIPASE 11   No results for input(s): "AMMONIA" in the last 168 hours. CBC: Recent Labs  Lab 10/29/22 1500 10/31/22 0745  WBC 9.9 8.3  HGB 14.6 12.4  HCT 45.6 39.8  MCV 89.9 92.3  PLT 172 160   Cardiac Enzymes: No results for input(s): "CKTOTAL", "CKMB", "CKMBINDEX", "TROPONINI" in the last 168 hours. BNP: Invalid input(s): "POCBNP" CBG: Recent Labs  Lab 10/31/22 0745 10/31/22 1104 10/31/22 1635 10/31/22 1950 11/01/22 0730  GLUCAP 126* 189* 82 130* 124*   D-Dimer No results for input(s): "DDIMER" in the last 72 hours. Hgb A1c Recent Labs    10/30/22 0648  HGBA1C 7.2*   Lipid Profile No results for input(s): "CHOL", "HDL", "LDLCALC", "TRIG", "CHOLHDL", "LDLDIRECT" in the last 72 hours. Thyroid function studies No results for input(s): "TSH", "T4TOTAL", "T3FREE", "THYROIDAB" in the last 72 hours.  Invalid input(s): "FREET3" Anemia work up No results for input(s): "VITAMINB12", "FOLATE", "FERRITIN", "TIBC", "IRON", "RETICCTPCT" in the last 72 hours. Urinalysis    Component Value Date/Time   COLORURINE YELLOW 10/29/2022 1930   APPEARANCEUR HAZY (A) 10/29/2022 1930   LABSPEC 1.044 (H) 10/29/2022 1930   LABSPEC 1.015 05/04/2021 1230   PHURINE 6.0 10/29/2022 1930   GLUCOSEU >1,000 (A) 10/29/2022 1930   HGBUR MODERATE (A) 10/29/2022 1930   BILIRUBINUR NEGATIVE 10/29/2022 1930   BILIRUBINUR negative 05/04/2021 1230   KETONESUR 40 (A) 10/29/2022 1930   PROTEINUR 30 (A) 10/29/2022 1930   UROBILINOGEN 1.0 10/17/2009 1021   NITRITE POSITIVE (A) 10/29/2022 1930   LEUKOCYTESUR NEGATIVE  10/29/2022 1930   Sepsis Labs Recent Labs  Lab  10/29/22 1500 10/31/22 0745  WBC 9.9 8.3   Microbiology Recent Results (from the past 240 hour(s))  Resp panel by RT-PCR (RSV, Flu A&B, Covid) Anterior Nasal Swab     Status: None   Collection Time: 10/29/22  4:27 PM   Specimen: Anterior Nasal Swab  Result Value Ref Range Status   SARS Coronavirus 2 by RT PCR NEGATIVE NEGATIVE Final    Comment: (NOTE) SARS-CoV-2 target nucleic acids are NOT DETECTED.  The SARS-CoV-2 RNA is generally detectable in upper respiratory specimens during the acute phase of infection. The lowest concentration of SARS-CoV-2 viral copies this assay can detect is 138 copies/mL. A negative result does not preclude SARS-Cov-2 infection and should not be used as the sole basis for treatment or other patient management decisions. A negative result may occur with  improper specimen collection/handling, submission of specimen other than nasopharyngeal swab, presence of viral mutation(s) within the areas targeted by this assay, and inadequate number of viral copies(<138 copies/mL). A negative result must be combined with clinical observations, patient history, and epidemiological information. The expected result is Negative.  Fact Sheet for Patients:  BloggerCourse.com  Fact Sheet for Healthcare Providers:  SeriousBroker.it  This test is no t yet approved or cleared by the [place] FDA and  has been authorized for detection and/or diagnosis of SARS-CoV-2 by FDA under an Emergency Use Authorization (EUA). This EUA will remain  in effect (meaning this test can be used) for the duration of the COVID-19 declaration under Section 564(b)(1) of the Act, 21 U.S.C.section 360bbb-3(b)(1), unless the authorization is terminated  or revoked sooner.       Influenza A by PCR NEGATIVE NEGATIVE Final   Influenza B by PCR NEGATIVE NEGATIVE Final    Comment: (NOTE) The Xpert Xpress SARS-CoV-2/FLU/RSV plus assay is  intended as an aid in the diagnosis of influenza from Nasopharyngeal swab specimens and should not be used as a sole basis for treatment. Nasal washings and aspirates are unacceptable for Xpert Xpress SARS-CoV-2/FLU/RSV testing.  Fact Sheet for Patients: BloggerCourse.com  Fact Sheet for Healthcare Providers: SeriousBroker.it  This test is not yet approved or cleared by the [place] FDA and has been authorized for detection and/or diagnosis of SARS-CoV-2 by FDA under an Emergency Use Authorization (EUA). This EUA will remain in effect (meaning this test can be used) for the duration of the COVID-19 declaration under Section 564(b)(1) of the Act, 21 U.S.C. section 360bbb-3(b)(1), unless the authorization is terminated or revoked.     Resp Syncytial Virus by PCR NEGATIVE NEGATIVE Final    Comment: (NOTE) Fact Sheet for Patients: BloggerCourse.com  Fact Sheet for Healthcare Providers: SeriousBroker.it  This test is not yet approved or cleared by the [place] FDA and has been authorized for detection and/or diagnosis of SARS-CoV-2 by FDA under an Emergency Use Authorization (EUA). This EUA will remain in effect (meaning this test can be used) for the duration of the COVID-19 declaration under Section 564(b)(1) of the Act, 21 U.S.C. section 360bbb-3(b)(1), unless the authorization is terminated or revoked.  Performed at [facility], [address] [place], [place] [address]   Urine Culture     Status: Abnormal   Collection Time: 10/29/22  7:30 PM   Specimen: Urine, Clean Catch  Result Value Ref Range Status   Specimen Description   Final    URINE, CLEAN CATCH Performed at [facility], [address] [place],  [place] [address]    Special Requests   Final    NONE Performed at [facility], [address] [place], [place] [address]    Culture >=100,000 COLONIES/mL STAPHYLOCOCCUS LUGDUNENSIS (A)  Final   Report Status 10/31/2022 FINAL  Final   Organism ID, Bacteria STAPHYLOCOCCUS LUGDUNENSIS (A)  Final      Susceptibility   Staphylococcus lugdunensis - MIC*    CIPROFLOXACIN <=0.5 SENSITIVE Sensitive     GENTAMICIN <=0.5 SENSITIVE Sensitive     NITROFURANTOIN <=16 SENSITIVE Sensitive     OXACILLIN 2 SENSITIVE Sensitive     TETRACYCLINE <=1 SENSITIVE Sensitive     VANCOMYCIN 1 SENSITIVE Sensitive     TRIMETH/SULFA <=10 SENSITIVE Sensitive     CLINDAMYCIN <=0.25 SENSITIVE Sensitive     RIFAMPIN <=0.5 SENSITIVE Sensitive     Inducible Clindamycin NEGATIVE Sensitive     * >=100,000 COLONIES/mL STAPHYLOCOCCUS LUGDUNENSIS  Blood culture (routine x 2)     Status: None (Preliminary result)   Collection Time: 10/29/22  8:06 PM   Specimen: BLOOD  Result Value Ref Range Status   Specimen Description   Final    BLOOD BLOOD RIGHT HAND Performed at [facility], [address] [place], [place] [address]    Special Requests   Final    BOTTLES DRAWN AEROBIC AND ANAEROBIC Blood Culture adequate volume Performed at [facility], [address], [place], [place] [address]    Culture   Final    NO GROWTH 2 DAYS Performed at [facility], [address], [place], [place] [address]    Report Status PENDING  Incomplete  Blood culture (routine x 2)     Status: None (Preliminary result)   Collection Time: 10/29/22  8:11 PM   Specimen: BLOOD  Result Value Ref Range Status   Specimen Description   Final    BLOOD BLOOD LEFT WRIST Performed at [facility], [address] [place], [place] [address]    Special Requests   Final    BOTTLES DRAWN AEROBIC AND ANAEROBIC Blood Culture adequate volume Performed at [facility], [address], [place], [place] [address]    Culture   Final    NO GROWTH 2 DAYS Performed  at [facility], [address], [place], [place] [address]    Report Status PENDING  Incomplete    Please note: You were cared for by a hospitalist during your hospital stay. Once you are discharged, your primary care physician will handle any further medical issues. Please note that NO REFILLS for any discharge medications will be authorized once you are discharged, as it is imperative that you return to your primary care physician (or establish a relationship with a primary care physician if you do not have one) for your post hospital discharge needs so that they can reassess your need for medications and monitor your lab values.    Time coordinating discharge: 40 minutes  SIGNED:   Burnadette Pop, MD  [facility] 11/01/2022, 9:37 AM Pager [phone]  If 7PM-7AM, please contact night-coverage [email or web address] Password [email or web address]

## 2022-11-02 ENCOUNTER — Telehealth: Payer: Self-pay | Admitting: Medical

## 2022-11-02 ENCOUNTER — Telehealth: Payer: Self-pay

## 2022-11-02 NOTE — Telephone Encounter (Signed)
Transition Care Management Follow-up Telephone Call Date of discharge and from where: [facility] 11/01/22 How have you been since you were released from the hospital? [facility] Any questions or concerns? Yes  Items Reviewed: Did the pt receive and understand the discharge instructions provided? Yes  Medications obtained and verified? Yes  Other? No  Any new allergies since your discharge? No  Dietary orders reviewed? Yes Do you have support at home? Yes   Home Care and Equipment/Supplies: Were home health services ordered? no Follow up appointments reviewed:  PCP Hospital f/u appt confirmed? Yes  Scheduled to see Dr. Susann Givens on 11/02/22 @ 11:15. Specialist Hospital f/u appt confirmed? No   Are transportation arrangements needed? No  If their condition worsens, is the pt aware to call PCP or go to the Emergency Dept.? Yes Was the patient provided with contact information for the PCP's office or ED? Yes Was to pt encouraged to call back with questions or concerns? Yes

## 2022-11-02 NOTE — Telephone Encounter (Signed)
Transition Care Management Unsuccessful Follow-up Telephone Call  Date of discharge and from where:  [facility] 11/01/2022  Attempts:  1st Attempt  Reason for unsuccessful TCM follow-up call:  Left voice message Karena Addison, LPN [facility] Nurse Health Advisor Direct Dial [phone]

## 2022-11-02 NOTE — Telephone Encounter (Signed)
Pt called and said she got a vm to schedule a fu but she wasn't sure how many weeks or what for?

## 2022-11-03 LAB — CULTURE, BLOOD (ROUTINE X 2)
Culture: NO GROWTH
Culture: NO GROWTH
Special Requests: ADEQUATE
Special Requests: ADEQUATE

## 2022-11-05 NOTE — Telephone Encounter (Signed)
Transition Care Management Follow-up Telephone Call Date of discharge and from where: [facility] 11/01/2022 How have you been since you were released from the hospital? better Any questions or concerns? No  Items Reviewed: Did the pt receive and understand the discharge instructions provided? Yes  Medications obtained and verified? Yes  Other? No  Any new allergies since your discharge? Yes  Dietary orders reviewed? Yes Do you have support at home? Yes   Home Care and Equipment/Supplies: Were home health services ordered? no If so, what is the name of the agency? N/a  Has the agency set up a time to come to the patient's home? not applicable Were any new equipment or medical supplies ordered?  No What is the name of the medical supply agency? N/a Were you able to get the supplies/equipment? not applicable Do you have any questions related to the use of the equipment or supplies? No  Functional Questionnaire: (I = Independent and D = Dependent) ADLs: I  Bathing/Dressing- I  Meal PrepI  Eating- I  Maintaining continence- I  Transferring/Ambulation- I  Managing Meds- I  Follow up appointments reviewed:  PCP Hospital f/u appt confirmed? Yes  Scheduled to see Dr Aleen Campi on 11/09/2022 @ 11:15. Specialist Hospital f/u appt confirmed? No  Are transportation arrangements needed? No  If their condition worsens, is the pt aware to call PCP or go to the Emergency Dept.? Yes Was the patient provided with contact information for the PCP's office or ED? Yes Was to pt encouraged to call back with questions or concerns? Yes Karena Addison, LPN [facility] Nurse Health Advisor Direct Dial [phone]

## 2022-11-05 NOTE — Telephone Encounter (Signed)
Transition Care Management Unsuccessful Follow-up Telephone Call  Date of discharge and from where:  [facility] 11/01/2022  Attempts:  2nd Attempt  Reason for unsuccessful TCM follow-up call:  Left voice message Karena Addison, LPN [facility] Nurse Health Advisor Direct Dial [phone]

## 2022-11-09 ENCOUNTER — Encounter: Payer: Self-pay | Admitting: Medical

## 2022-11-09 ENCOUNTER — Ambulatory Visit (INDEPENDENT_AMBULATORY_CARE_PROVIDER_SITE_OTHER): Payer: Medicare Other | Admitting: Medical

## 2022-11-09 VITALS — BP 170/88 | HR 72 | Temp 97.8°F | Resp 18 | Wt 144.2 lb

## 2022-11-09 DIAGNOSIS — R9431 Abnormal electrocardiogram [ECG] [EKG]: Secondary | ICD-10-CM

## 2022-11-09 DIAGNOSIS — Z794 Long term (current) use of insulin: Secondary | ICD-10-CM

## 2022-11-09 DIAGNOSIS — I10 Essential (primary) hypertension: Secondary | ICD-10-CM

## 2022-11-09 DIAGNOSIS — I160 Hypertensive urgency: Secondary | ICD-10-CM | POA: Diagnosis not present

## 2022-11-09 DIAGNOSIS — M05741 Rheumatoid arthritis with rheumatoid factor of right hand without organ or systems involvement: Secondary | ICD-10-CM

## 2022-11-09 DIAGNOSIS — N1831 Chronic kidney disease, stage 3a: Secondary | ICD-10-CM

## 2022-11-09 DIAGNOSIS — Z79899 Other long term (current) drug therapy: Secondary | ICD-10-CM

## 2022-11-09 DIAGNOSIS — G4733 Obstructive sleep apnea (adult) (pediatric): Secondary | ICD-10-CM

## 2022-11-09 DIAGNOSIS — I509 Heart failure, unspecified: Secondary | ICD-10-CM | POA: Diagnosis not present

## 2022-11-09 DIAGNOSIS — E785 Hyperlipidemia, unspecified: Secondary | ICD-10-CM

## 2022-11-09 DIAGNOSIS — N3090 Cystitis, unspecified without hematuria: Secondary | ICD-10-CM

## 2022-11-09 DIAGNOSIS — M05742 Rheumatoid arthritis with rheumatoid factor of left hand without organ or systems involvement: Secondary | ICD-10-CM

## 2022-11-09 DIAGNOSIS — E1101 Type 2 diabetes mellitus with hyperosmolarity with coma: Secondary | ICD-10-CM

## 2022-11-09 DIAGNOSIS — E1169 Type 2 diabetes mellitus with other specified complication: Secondary | ICD-10-CM

## 2022-11-09 LAB — POCT URINALYSIS DIP (PROADVANTAGE DEVICE)
Bilirubin, UA: NEGATIVE
Blood, UA: NEGATIVE
Glucose, UA: NEGATIVE mg/dL
Ketones, POC UA: NEGATIVE mg/dL
Leukocytes, UA: NEGATIVE
Nitrite, UA: NEGATIVE
Specific Gravity, Urine: 1.01
Urobilinogen, Ur: 0.2
pH, UA: 7 (ref 5.0–8.0)

## 2022-11-09 NOTE — Progress Notes (Signed)
Subjective:  Maria Foster is a 66 y.o. female who presents for Chief Complaint  Patient presents with   Hospitalization Follow-up    Admitted to [facility] on 10/29/2022, and discharged on 12/14/023. [id number] and acute cystitis       Here for hospital f/u.  Of note her last visit here was February 2023 for diabetes follow-up.  She has not been back since although she was due back in 4 months after that visit  She was hospitalized 10/29/2022 through 11/01/2022  Currently today she feels fine.  She notes BP at home 140/78, 138/79, all relatively good numbers at home this week.  She denies any chest pain, shortness of breath, palpitations, no urinary symptoms today, she does have some constipation today.  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  On presentation she was hypertensive, tachycardic.  Lab work did not show any leukocytosis or anemia but showed potassium of 3.4, elevated lactic acid at 2.6, magnesium of 1.6.  UA was suspicious for UTI.  Urine culture sent and it showed staph lugdunensis.  Blood cultures have been negative.  Patient feels much better today.  Medically stable for discharge.   Following problems were addressed during her hospitalization:   Sepsis secondary to acute cystitis: Presented with fever, chills, lower  abdominal pain, discomfort, urinary urgency/frequency, nausea, vomiting.  UA strongly suggestive of  UTI.  Urine culture showed pan sensitive  staph lugdunensis. Initial lactic acid was elevated which has resolved.  No leukocytosis.  Blood cultures have not shown any growth.  Antibiotics changed to oral.   Hypertensive urgency: She will hypertensive on presentation.  Continue home medications.BP stable   Proctitis: CT showed increased stool in the rectum with associated inferior rectal wall thickening with suspicion of proctitis.  Patient does not have any rectal pain.No further work up   Hypokalemia/hypomagnesemia/prolonged QT: Potassium and Magnesium supplemented.New EKG today showed Qtc of 488   Cough: She is a smoker , 1 pack lasts for 3 days.  Last smoking 2 weeks ago.  Lungs are clear to auscultation.  COVID/influenza negative.  Counseled for cessation   Combined systolic/diastolic CHF: No signs of volume overload . On Entresto.  She has elevated BNP on presentation, which has improved.  Echo showed EF of 45 to [id number], grade 1 diastolic function.  Will recommend follow-up with cardiology as an outpatient.  Also takes Imdur, metoprolol,lasix daily, Tradjenta, spironolactone   Stage II/IIIa CKD: Currently kidney function at baseline.   Coronary artery  disease: No anginal symptoms.  Denies chest pain   Hyperlipidemia: Continue Lipitor   Rheumatoid arthritis: Has severe deformity of hands.  Follows with rheumatology.On leflunomide   No other aggravating or relieving factors.    No other c/o.  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   OU progressive, will medically need  to have cataract surgery with lens will implantation so as to allow for complete clearance of the vitreous cavity in anticipation of likely vitrectomy in the future for progressive PDR despite previous PRP as delineated by the E DRS series of  reports   Diabetes mellitus type 2 with complications ([place])    diagnosed probably 2012 per patient   Diabetic retinopathy ([place])    Hypertension    Rheumatoid arthritis ([place])    Current Outpatient Medications on File Prior to Visit  Medication Sig Dispense Refill   acetaminophen (TYLENOL) 325 MG tablet Take 1-2 tablets (325-650 mg total) by mouth every 6 (six) hours as needed for mild pain (pain score 1-3 or temp > 100.5). (Patient taking differently: Take 325 mg by mouth as needed for mild pain.) 20 tablet 0   aspirin EC 81 MG tablet Take 81 mg by mouth every Monday, Wednesday, and Friday.     atorvastatin (LIPITOR) 40 MG tablet Take 1 tablet (40 mg total) by mouth daily. 90 tablet 3   ENTRESTO 97-103 MG TAKE 1 TABLET BY MOUTH TWICE DAILY (Patient taking differently: Take 1 tablet by mouth 2 (two) times daily.) 60 tablet 6   ferrous gluconate (FERGON) 324 MG tablet TAKE 1 TABLET(324 MG) BY MOUTH TWICE DAILY WITH A MEAL (Patient taking differently: Take 324 mg by mouth in the morning and at bedtime.) 180 tablet 1   furosemide (LASIX) 40 MG tablet Take 1 tablet (40 mg total) by mouth daily. (Patient taking differently: Take 20 mg by mouth daily.) 90 tablet 3   hydrALAZINE (APRESOLINE) 50 MG tablet Take 1 tablet (50 mg total) by mouth every 8 (eight) hours. (Patient taking differently: Take 50 mg by mouth 3 (three) times daily.) 90 tablet 6   isosorbide mononitrate (IMDUR) 60 MG 24 hr tablet TAKE 1 TABLET(60 MG) BY MOUTH DAILY (Patient taking differently: Take 60 mg by mouth daily.) 30 tablet 11   latanoprost (XALATAN) 0.005 % ophthalmic solution Place 1 drop into both eyes at bedtime.     leflunomide (ARAVA) 20 MG tablet Take 1 tablet (20 mg total) by mouth daily. (Patient taking differently: Take 20 mg by mouth at bedtime.) 30 tablet 2   metoprolol succinate (TOPROL-XL) 50 MG 24 hr tablet TAKE 1 TABLET(50 MG) BY MOUTH DAILY WITH OR IMMEDIATELY FOLLOWING A MEAL (Patient taking differently: Take 50  mg by mouth daily.) 30 tablet 5   ondansetron (ZOFRAN) 4 MG tablet Take 1 tablet (4 mg total) by mouth every 8 (eight) hours as needed for nausea or vomiting. (Patient taking differently: Take 4 mg by mouth as needed for nausea or vomiting.) 20 tablet 0   polyethylene glycol (MIRALAX / GLYCOLAX) 17 g packet Take 17 g by mouth daily as needed for mild constipation. 14 each 0   potassium chloride SA (KLOR-CON) 20 MEQ tablet Take 2 tablets (40 mEq total) by mouth 2 (two) times daily. 120 tablet 3   sacubitril-valsartan (ENTRESTO) 97-103 MG TAKE 1 TABLET BY MOUTH TWICE DAILY. NEED FOLLOW UP APPOINTMENT FOR ANYMORE REFILLS (Patient taking differently: Take 1 tablet by mouth 2 (two) times daily.) 60 tablet 0   spironolactone (ALDACTONE) 25 MG tablet TAKE 1 TABLET(25 MG) BY MOUTH DAILY (Patient taking differently: Take 25 mg by mouth daily.) 90 tablet 0   TRADJENTA 5 MG TABS tablet TAKE 1 TABLET(5 MG) BY MOUTH DAILY (Patient taking differently: Take 5 mg by mouth daily.) 90 tablet 0   Vitamin D, Ergocalciferol, (DRISDOL) 1.25 MG (50000 UNIT)  CAPS capsule Take 50,000 Units by mouth every Friday.     guaiFENesin (MUCINEX) 600 MG 12 hr tablet Take 1 tablet (600 mg total) by mouth 2 (two) times daily. (Patient not taking: Reported on 11/09/2022) 10 tablet 0   No current facility-administered medications on file prior to visit.     The following portions of the patient's history were reviewed and updated as appropriate: allergies, current medications, past family history, past medical history, past social history, past surgical history and problem list.  ROS Otherwise as in subjective above    Objective: BP (!) 170/88   Pulse 72   Temp 97.8 F (36.6 C) (Tympanic)   Resp 18   Wt 144 lb 3.2 oz (65.4 kg)   SpO2 98% Comment: room air  BMI 25.54 kg/m   Wt Readings from Last 3 Encounters:  11/09/22 144 lb 3.2 oz (65.4 kg)  10/30/22 145 lb 1 oz (65.8 kg)  10/29/22 145 lb (65.8 kg)   BP Readings  from Last 3 Encounters:  11/09/22 (!) 170/88  11/01/22 (!) 152/78  09/26/22 (!) 202/80    General appearance: alert, no distress, well developed, well nourished HEENT: normocephalic, sclerae anicteric, conjunctiva pink and moist, TMs pearly, nares patent, no discharge or erythema, pharynx normal Oral cavity: MMM, no lesions Neck: supple, no lymphadenopathy, no thyromegaly, no masses, no JVD or bruit Heart: RRR, normal S1, S2, no murmurs Lungs: CTA bilaterally, no wheezes, rhonchi, or rales Abdomen: +bs, soft, non tender, non distended, no masses, no hepatomegaly, no splenomegaly MSK: No obvious arthritic bony deformities of bilateral hands Pulses: 2+ radial pulses, 1+ pedal pulses, normal cap refill Ext: no edema  EKG reviewed, abnormal, prolonged QT, sinus bradycardia, but unchanged from prior EKG  Assessment: Encounter Diagnoses  Name Primary?   Hypertensive urgency Yes   QT prolongation    Congestive heart failure, unspecified HF chronicity, unspecified heart failure type (HCC)    Essential hypertension, benign    Hyperlipidemia associated with type 2 diabetes mellitus (HCC)    Type 2 diabetes mellitus with hyperosmolar coma, with long-term current use of insulin (HCC)    Rheumatoid arthritis involving both hands with positive rheumatoid factor (HCC)    Stage 3a chronic kidney disease (HCC)    Cystitis    High risk medication use    OSA (obstructive sleep apnea)    White coat syndrome with hypertension      Plan: Hypertensive urgency-there is probably some level of whitecoat hypertension as well.  She notes  home readings in the past week in the 140 systolic range.  Continue current medications, continue routine follow-up with cardiology as planned soon  QT prolongation-follow-up with cardiology to discuss, avoid medications that prolong QT  CHF-she had an elevated BNP at the hospital recently.  She is compliant with medications.  We discussed limiting salt, doing daily  weight checks, additional labs today.  Follow-up with cardiology soon as planned  Hyperlipidemia-continue current medications  Diabetes-continue current medications  Rheumatoid arthritis-follow-up rheumatoid about other options since she feels like her current medicine is not working as well  Recent urinary tract infection-urine today looks okay  CKD 3-avoid anti-inflammatories, avoid dehydration  Sleep apnea-she has some trouble using her mask.  I strongly recommend she try to use this daily.  She felt like some of the issues being too hot in the evening.  I advise she cut her temperature down in the house that she is running at 74 degrees, she will continue her sling fan  [place] was seen today for hospitalization follow-up.  Diagnoses and all orders for this visit:  Hypertensive urgency -     CBC -     Basic metabolic panel -     POCT Urinalysis DIP (Proadvantage Device)  QT prolongation  Congestive heart failure, unspecified HF chronicity, unspecified heart failure type (HCC) -     Brain natriuretic peptide -     EKG 12-Lead  Essential hypertension, benign -     EKG 12-Lead  Hyperlipidemia associated with type 2 diabetes mellitus ([place])  Type 2 diabetes mellitus with hyperosmolar coma, with long-term current use of insulin (HCC)  Rheumatoid arthritis involving both hands with positive rheumatoid factor (HCC)  Stage 3a chronic kidney disease (HCC) -     CBC -     Basic metabolic panel  Cystitis  High risk medication use -     CBC -     Basic metabolic panel  OSA (obstructive sleep apnea)  White coat syndrome with hypertension    Follow up: Pending labs

## 2022-11-10 LAB — BASIC METABOLIC PANEL
BUN/Creatinine Ratio: 24 (ref 12–28)
BUN: 31 mg/dL — ABNORMAL HIGH (ref 8–27)
CO2: 25 mmol/L (ref 20–29)
Calcium: 9.7 mg/dL (ref 8.7–10.3)
Chloride: 99 mmol/L (ref 96–106)
Creatinine, Ser: 1.28 mg/dL — ABNORMAL HIGH (ref 0.57–1.00)
Glucose: 221 mg/dL — ABNORMAL HIGH (ref 70–99)
Potassium: 4.5 mmol/L (ref 3.5–5.2)
Sodium: 138 mmol/L (ref 134–144)
eGFR: 46 mL/min/{1.73_m2} — ABNORMAL LOW (ref >59)

## 2022-11-10 LAB — CBC
Hematocrit: 41.2 % (ref 34.0–46.6)
Hemoglobin: 12.7 g/dL (ref 11.1–15.9)
MCH: 28.9 pg (ref 26.6–33.0)
MCHC: 30.8 g/dL — ABNORMAL LOW (ref 31.5–35.7)
MCV: 94 fL (ref 79–97)
Platelets: 204 10*3/uL (ref 150–450)
RBC: 4.4 x10E6/uL (ref 3.77–5.28)
RDW: 12 % (ref 11.7–15.4)
WBC: 7.9 10*3/uL (ref 3.4–10.8)

## 2022-11-10 LAB — BRAIN NATRIURETIC PEPTIDE: BNP: 348.7 pg/mL — ABNORMAL HIGH (ref 0.0–100.0)

## 2022-11-13 ENCOUNTER — Other Ambulatory Visit: Payer: Self-pay | Admitting: Medical

## 2022-11-13 NOTE — Progress Notes (Signed)
Labs overall fairly stable.  Heart marker lab had improved, blood counts ok, still a little dry in regards to hydration and kidneys, sugar was over 200.  Follow up with your heart doctor soon, preferably within next 2 weeks.  I would like to see you back in a month, and bring your blood sugar readings with you.  How is your BP today and the last few days?

## 2022-11-14 ENCOUNTER — Other Ambulatory Visit: Payer: Self-pay | Admitting: Medical

## 2022-11-14 MED ORDER — EMPAGLIFLOZIN 10 MG PO TABS: 10.0000 mg | ORAL_TABLET | Freq: Every day | ORAL | 2 refills | Status: DC

## 2022-11-14 NOTE — Progress Notes (Signed)
jard

## 2022-11-20 ENCOUNTER — Ambulatory Visit: Payer: Medicare Other

## 2022-11-23 ENCOUNTER — Other Ambulatory Visit: Payer: Self-pay | Admitting: Internal Medicine

## 2022-11-23 ENCOUNTER — Ambulatory Visit: Payer: Medicare Other

## 2022-11-26 ENCOUNTER — Ambulatory Visit: Payer: Medicare Other | Admitting: Internal Medicine

## 2022-11-27 ENCOUNTER — Telehealth: Payer: Self-pay | Admitting: Internal Medicine

## 2022-11-27 NOTE — Telephone Encounter (Signed)
Left message for pt to call back about flu shots

## 2022-12-04 ENCOUNTER — Ambulatory Visit (INDEPENDENT_AMBULATORY_CARE_PROVIDER_SITE_OTHER): Payer: Medicare Other

## 2022-12-04 VITALS — Ht 63.0 in | Wt 145.0 lb

## 2022-12-04 DIAGNOSIS — Z0000 Encounter for general adult medical examination without abnormal findings: Secondary | ICD-10-CM | POA: Diagnosis not present

## 2022-12-04 NOTE — Patient Instructions (Signed)
[name] , Thank you for taking time to come for your Medicare Wellness Visit. I appreciate your ongoing commitment to your health goals. Please review the following plan we discussed and let me know if I can assist you in the future.   These are the goals we discussed:  Goals      Patient Stated     11/17/2021, stay active     Patient Stated     12/04/2022, no goals        This is a list of the screening recommended for you and due dates:  Health Maintenance  Topic Date Due   Zoster (Shingles) Vaccine (1 of 2) Never done   Colon Cancer Screening  Never done   DEXA scan (bone density measurement)  Never done   Yearly kidney health urinalysis for diabetes  05/04/2022   Pneumonia Vaccine (2 - PCV) 05/04/2022   Complete foot exam   05/04/2022   COVID-19 Vaccine (4 - 2023-24 season) 07/20/2022   Flu Shot  02/17/2023*   Hemoglobin A1C  05/01/2023   Mammogram  05/12/2023   Eye exam for diabetics  10/09/2023   Yearly kidney function blood test for diabetes  11/10/2023   Medicare Annual Wellness Visit  12/05/2023   DTaP/Tdap/Td vaccine (2 - Td or Tdap) 07/12/2031   Hepatitis C Screening: USPSTF Recommendation to screen - Ages 62-79 yo.  Completed   HPV Vaccine  Aged Out  *Topic was postponed. The date shown is not the original due date.    Advanced directives: Advance directive discussed with you today.   Conditions/risks identified: none  Next appointment: Follow up in one year for your annual wellness visit    Preventive Care 65 Years and Older, Female Preventive care refers to lifestyle choices and visits with your health care provider that can promote health and wellness. What does preventive care include? A yearly physical exam. This is also called an annual well check. Dental exams once or twice a year. Routine eye exams. Ask your health care provider how often you should have your eyes checked. Personal lifestyle choices, including: Daily care of your teeth and  gums. Regular physical activity. Eating a healthy diet. Avoiding tobacco and drug use. Limiting alcohol use. Practicing safe sex. Taking low-dose aspirin every day. Taking vitamin and mineral supplements as recommended by your health care provider. What happens during an annual well check? The services and screenings done by your health care provider during your annual well check will depend on your age, overall health, lifestyle risk factors, and family history of disease. Counseling  Your health care provider may ask you questions about your: Alcohol use. Tobacco use. Drug use. Emotional well-being. Home and relationship well-being. Sexual activity. Eating habits. History of falls. Memory and ability to understand (cognition). Work and work Statistician. Reproductive health. Screening  You may have the following tests or measurements: Height, weight, and BMI. Blood pressure. Lipid and cholesterol levels. These may be checked every 5 years, or more frequently if you are over 60 years old. Skin check. Lung cancer screening. You may have this screening every year starting at age 67 if you have a 30-pack-year history of smoking and currently smoke or have quit within the past 15 years. Fecal occult blood test (FOBT) of the stool. You may have this test every year starting at age 44. Flexible sigmoidoscopy or colonoscopy. You may have a sigmoidoscopy every 5 years or a colonoscopy every 10 years starting at age 36. Hepatitis C blood  test. Hepatitis B blood test. Sexually transmitted disease (STD) testing. Diabetes screening. This is done by checking your blood sugar (glucose) after you have not eaten for a while (fasting). You may have this done every 1-3 years. Bone density scan. This is done to screen for osteoporosis. You may have this done starting at age 66. Mammogram. This may be done every 1-2 years. Talk to your health care provider about how often you should have regular  mammograms. Talk with your health care provider about your test results, treatment options, and if necessary, the need for more tests. Vaccines  Your health care provider may recommend certain vaccines, such as: Influenza vaccine. This is recommended every year. Tetanus, diphtheria, and acellular pertussis (Tdap, Td) vaccine. You may need a Td booster every 10 years. Zoster vaccine. You may need this after age 73. Pneumococcal 13-valent conjugate (PCV13) vaccine. One dose is recommended after age 44. Pneumococcal polysaccharide (PPSV23) vaccine. One dose is recommended after age 17. Talk to your health care provider about which screenings and vaccines you need and how often you need them. This information is not intended to replace advice given to you by your health care provider. Make sure you discuss any questions you have with your health care provider. Document Released: 12/02/2015 Document Revised: 07/25/2016 Document Reviewed: 09/06/2015 Elsevier Interactive Patient Education  2017 [place] Prevention in the Home Falls can cause injuries. They can happen to people of all ages. There are many things you can do to make your home safe and to help prevent falls. What can I do on the outside of my home? Regularly fix the edges of walkways and driveways and fix any cracks. Remove anything that might make you trip as you walk through a door, such as a raised step or threshold. Trim any bushes or trees on the path to your home. Use bright outdoor lighting. Clear any walking paths of anything that might make someone trip, such as rocks or tools. Regularly check to see if handrails are loose or broken. Make sure that both sides of any steps have handrails. Any raised decks and porches should have guardrails on the edges. Have any leaves, snow, or ice cleared regularly. Use sand or salt on walking paths during winter. Clean up any spills in your garage right away. This includes oil  or grease spills. What can I do in the bathroom? Use night lights. Install grab bars by the toilet and in the tub and shower. Do not use towel bars as grab bars. Use non-skid mats or decals in the tub or shower. If you need to sit down in the shower, use a plastic, non-slip stool. Keep the floor dry. Clean up any water that spills on the floor as soon as it happens. Remove soap buildup in the tub or shower regularly. Attach bath mats securely with double-sided non-slip rug tape. Do not have throw rugs and other things on the floor that can make you trip. What can I do in the bedroom? Use night lights. Make sure that you have a light by your bed that is easy to reach. Do not use any sheets or blankets that are too big for your bed. They should not hang down onto the floor. Have a firm chair that has side arms. You can use this for support while you get dressed. Do not have throw rugs and other things on the floor that can make you trip. What can I do in the kitchen? Clean  up any spills right away. Avoid walking on wet floors. Keep items that you use a lot in easy-to-reach places. If you need to reach something above you, use a strong step stool that has a grab bar. Keep electrical cords out of the way. Do not use floor polish or wax that makes floors slippery. If you must use wax, use non-skid floor wax. Do not have throw rugs and other things on the floor that can make you trip. What can I do with my stairs? Do not leave any items on the stairs. Make sure that there are handrails on both sides of the stairs and use them. Fix handrails that are broken or loose. Make sure that handrails are as long as the stairways. Check any carpeting to make sure that it is firmly attached to the stairs. Fix any carpet that is loose or worn. Avoid having throw rugs at the top or bottom of the stairs. If you do have throw rugs, attach them to the floor with carpet tape. Make sure that you have a light  switch at the top of the stairs and the bottom of the stairs. If you do not have them, ask someone to add them for you. What else can I do to help prevent falls? Wear shoes that: Do not have high heels. Have rubber bottoms. Are comfortable and fit you well. Are closed at the toe. Do not wear sandals. If you use a stepladder: Make sure that it is fully opened. Do not climb a closed stepladder. Make sure that both sides of the stepladder are locked into place. Ask someone to hold it for you, if possible. Clearly mark and make sure that you can see: Any grab bars or handrails. First and last steps. Where the edge of each step is. Use tools that help you move around (mobility aids) if they are needed. These include: Canes. Walkers. Scooters. Crutches. Turn on the lights when you go into a dark area. Replace any light bulbs as soon as they burn out. Set up your furniture so you have a clear path. Avoid moving your furniture around. If any of your floors are uneven, fix them. If there are any pets around you, be aware of where they are. Review your medicines with your doctor. Some medicines can make you feel dizzy. This can increase your chance of falling. Ask your doctor what other things that you can do to help prevent falls. This information is not intended to replace advice given to you by your health care provider. Make sure you discuss any questions you have with your health care provider. Document Released: 09/01/2009 Document Revised: 04/12/2016 Document Reviewed: 12/10/2014 Elsevier Interactive Patient Education  2017 [employer].

## 2022-12-04 NOTE — Progress Notes (Signed)
I connected with Norwood Levo today by telephone and verified that I am speaking with the correct person using two identifiers. Location patient: home Location provider: work Persons participating in the virtual visit: Lei Dower, Elisha Ponder LPN.   I discussed the limitations, risks, security and privacy concerns of performing an evaluation and management service by telephone and the availability of in person appointments. I also discussed with the patient that there may be a patient responsible charge related to this service. The patient expressed understanding and verbally consented to this telephonic visit.    Interactive audio and video telecommunications were attempted between this provider and patient, however failed, due to patient having technical difficulties OR patient did not have access to video capability.  We continued and completed visit with audio only.     Vital signs may be patient reported or missing.  Subjective:   Maria Foster is a 67 y.o. female who presents for Medicare Annual (Subsequent) preventive examination.  Review of Systems     Cardiac Risk Factors include: advanced age (>41men, >39 women);diabetes mellitus;dyslipidemia;hypertension     Objective:    Today's Vitals   12/04/22 1156  Weight: 145 lb (65.8 kg)  Height: 5\' 3"  (1.6 m)   Body mass index is 25.69 kg/m.     12/04/2022   12:01 PM 10/30/2022    2:42 AM 10/29/2022    2:59 PM 11/17/2021   11:18 AM 10/09/2021    7:49 AM 07/10/2021    6:02 PM 12/19/2020   11:25 PM  Advanced Directives  Does Patient Have a Medical Advance Directive? No No No Yes No No No  Type of 12/21/2020 of Healthcare Power of Attorney in Chart?    No - copy requested     Would patient like information on creating a medical advance directive?   No - Patient declined  No - Patient declined No - Patient declined No - Patient declined    Current Medications  (verified) Outpatient Encounter Medications as of 12/04/2022  Medication Sig   acetaminophen (TYLENOL) 325 MG tablet Take 1-2 tablets (325-650 mg total) by mouth every 6 (six) hours as needed for mild pain (pain score 1-3 or temp > 100.5). (Patient taking differently: Take 325 mg by mouth as needed for mild pain.)   aspirin EC 81 MG tablet Take 81 mg by mouth every Monday, Wednesday, and Friday.   atorvastatin (LIPITOR) 40 MG tablet Take 1 tablet (40 mg total) by mouth daily.   empagliflozin (JARDIANCE) 10 MG TABS tablet Take 1 tablet (10 mg total) by mouth daily before breakfast.   ENTRESTO 97-103 MG TAKE 1 TABLET BY MOUTH TWICE DAILY (Patient taking differently: Take 1 tablet by mouth 2 (two) times daily.)   ferrous gluconate (FERGON) 324 MG tablet TAKE 1 TABLET(324 MG) BY MOUTH TWICE DAILY WITH A MEAL (Patient taking differently: Take 324 mg by mouth in the morning and at bedtime.)   furosemide (LASIX) 40 MG tablet Take 1 tablet (40 mg total) by mouth daily. (Patient taking differently: Take 20 mg by mouth daily.)   hydrALAZINE (APRESOLINE) 50 MG tablet Take 1 tablet (50 mg total) by mouth every 8 (eight) hours. (Patient taking differently: Take 50 mg by mouth 3 (three) times daily.)   isosorbide mononitrate (IMDUR) 60 MG 24 hr tablet TAKE 1 TABLET(60 MG) BY MOUTH DAILY (Patient taking differently: Take 60 mg by mouth daily.)   latanoprost (XALATAN) 0.005 %  ophthalmic solution Place 1 drop into both eyes at bedtime.   leflunomide (ARAVA) 20 MG tablet Take 1 tablet (20 mg total) by mouth daily. (Patient taking differently: Take 20 mg by mouth at bedtime.)   metoprolol succinate (TOPROL-XL) 50 MG 24 hr tablet TAKE 1 TABLET(50 MG) BY MOUTH DAILY WITH OR IMMEDIATELY FOLLOWING A MEAL (Patient taking differently: Take 50 mg by mouth daily.)   ondansetron (ZOFRAN) 4 MG tablet Take 1 tablet (4 mg total) by mouth every 8 (eight) hours as needed for nausea or vomiting. (Patient taking differently: Take 4 mg  by mouth as needed for nausea or vomiting.)   polyethylene glycol (MIRALAX / GLYCOLAX) 17 g packet Take 17 g by mouth daily as needed for mild constipation.   potassium chloride SA (KLOR-CON) 20 MEQ tablet Take 2 tablets (40 mEq total) by mouth 2 (two) times daily.   spironolactone (ALDACTONE) 25 MG tablet TAKE 1 TABLET(25 MG) BY MOUTH DAILY (Patient taking differently: Take 25 mg by mouth daily.)   TRADJENTA 5 MG TABS tablet TAKE 1 TABLET(5 MG) BY MOUTH DAILY (Patient taking differently: Take 5 mg by mouth daily.)   Vitamin D, Ergocalciferol, (DRISDOL) 1.25 MG (50000 UNIT) CAPS capsule Take 50,000 Units by mouth every Friday.   ENTRESTO 97-103 MG TAKE 1 TABLET BY MOUTH TWICE DAILY. NEED FOLLOW UP APPOINTMENT FOR ANYMORE REFILLS   guaiFENesin (MUCINEX) 600 MG 12 hr tablet Take 1 tablet (600 mg total) by mouth 2 (two) times daily. (Patient not taking: Reported on 11/09/2022)   No facility-administered encounter medications on file as of 12/04/2022.    Allergies (verified) Metformin and related and Farxiga [dapagliflozin]   History: 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   OU progressive, will medically need to have cataract surgery with lens will implantation so as to allow for complete clearance of the vitreous cavity in anticipation of likely vitrectomy in the future for progressive PDR despite previous PRP as delineated by the E DRS series of reports   Diabetes mellitus type 2 with complications ([place])    diagnosed probably 2012 per patient   Diabetic retinopathy ([place])    Hypertension    Rheumatoid arthritis ([place])    Past Surgical History:  Procedure Laterality Date   INCISION AND DRAINAGE ABSCESS N/A 07/11/2021   Procedure: INCISION AND DRAINAGE RIGHT ELBOW;  Surgeon: Paralee Cancel, MD;  Location: [facility];  Service: Orthopedics;  Laterality: N/A;   PRP Bilateral OD - 07/23/2017 OS - 08/06/2017   RIGHT/LEFT  HEART CATH AND CORONARY ANGIOGRAPHY N/A 01/05/2021   Procedure: RIGHT/LEFT HEART CATH AND CORONARY ANGIOGRAPHY;  Surgeon: Jolaine Artist, MD;  Location: [facility];  Service: Cardiovascular;  Laterality: N/A;   TEE WITHOUT CARDIOVERSION N/A 10/09/2021   Procedure: TRANSESOPHAGEAL ECHOCARDIOGRAM (TEE);  Surgeon: Jolaine Artist, MD;  Location[facility] END[facility];  Service: Cardiovascular;  Laterality: N/A;   Family History  Problem Relation Age of Onset   Diabetes Mother    Hypertension Mother    Heart Problems Sister    Amblyopia Neg Hx    Blindness Neg Hx    Cataracts Neg Hx    Glaucoma Neg Hx    Macular degeneration Neg Hx    Retinal detachment Neg Hx    Strabismus Neg Hx    Retinitis pigmentosa Neg Hx    Social History   Socioeconomic History   Marital status: Married  Spouse name: Not on file   Number of children: Not on file   Years of education: Not on file   Highest education level: Not on file  Occupational History   Occupation: retired  Tobacco Use   Smoking status: Former    Packs/day: 0.50    Years: 15.00    Total pack years: 7.50    Types: Cigarettes    Passive exposure: Past   Smokeless tobacco: Never   Tobacco comments:    Quit 10/28/2022  Vaping Use   Vaping Use: Never used  Substance and Sexual Activity   Alcohol use: Not Currently   Drug use: No   Sexual activity: Not on file  Other Topics Concern   Not on file  Social History Narrative   Not on file   Social Determinants of Health   Financial Resource Strain: Low Risk  (12/04/2022)   Overall Financial Resource Strain (CARDIA)    Difficulty of Paying Living Expenses: Not hard at all  Food Insecurity: No Food Insecurity (12/04/2022)   Hunger Vital Sign    Worried About Running Out of Food in the Last Year: Never true    Ran Out of Food in the Last Year: Never true  Transportation Needs: No Transportation Needs (12/04/2022)   PRAPARE - Administrator, Civil Service  (Medical): No    Lack of Transportation (Non-Medical): No  Physical Activity: Sufficiently Active (12/04/2022)   Exercise Vital Sign    Days of Exercise per Week: 4 days    Minutes of Exercise per Session: 60 min  Stress: No Stress Concern Present (12/04/2022)   [employer] of Occupational Health - Occupational Stress Questionnaire    Feeling of Stress : Not at all  Social Connections: Not on file    Tobacco Counseling Counseling given: Not Answered Tobacco comments: Quit 10/28/2022   Clinical Intake:  Pre-visit preparation completed: Yes  Pain : No/denies pain     Nutritional Status: BMI 25 -29 Overweight Nutritional Risks: None Diabetes: Yes  How often do you need to have someone help you when you read instructions, pamphlets, or other written materials from your doctor or pharmacy?: 1 - Never  Diabetic? Yes Nutrition Risk Assessment:  Has the patient had any N/V/D within the last 2 months?  No  Does the patient have any non-healing wounds?  No  Has the patient had any unintentional weight loss or weight gain?  No   Diabetes:  Is the patient diabetic?  Yes  If diabetic, was a CBG obtained today?  No  Did the patient bring in their glucometer from home?  No  How often do you monitor your CBG's? daily.   Financial Strains and Diabetes Management:  Are you having any financial strains with the device, your supplies or your medication? No .  Does the patient want to be seen by Chronic Care Management for management of their diabetes?  No  Would the patient like to be referred to a Nutritionist or for Diabetic Management?  No   Diabetic Exams:  Diabetic Eye Exam: Completed 10/08/2022 Diabetic Foot Exam: Overdue, Pt has been advised about the importance in completing this exam. Pt is scheduled for diabetic foot exam on next appointment.   Interpreter Needed?: No  Information entered by :: NAllen LPN   Activities of Daily Living    12/04/2022   12:03 PM  10/30/2022    9:00 AM  In your present state of health, do you have any difficulty performing  the following activities:  Hearing? 0 0  Vision? 0 1  Difficulty concentrating or making decisions? 0 0  Walking or climbing stairs? 0 0  Dressing or bathing? 0 0  Doing errands, shopping? 0 0  Preparing Food and eating ? N   Using the Toilet? N   In the past six months, have you accidently leaked urine? N   Do you have problems with loss of bowel control? N   Managing your Medications? N   Managing your Finances? N   Housekeeping or managing your Housekeeping? N     Patient Care Team: Tysinger, Kermit Balo, PA-C as PCP - General (Family Medicine) Chilton Si, MD as PCP - Cardiology (Cardiology)  Indicate any recent Medical Services you may have received from other than [facility] providers in the past year (date may be approximate).     Assessment:   This is a routine wellness examination for Khamiyah.  Hearing/Vision screen Vision Screening - Comments:: Regular eye exams, Dr. Luciana Axe  Dietary issues and exercise activities discussed: Current Exercise Habits: Home exercise routine, Type of exercise: walking, Time (Minutes): 60, Frequency (Times/Week): 4, Weekly Exercise (Minutes/Week): 240   Goals Addressed             This Visit's Progress    Patient Stated       12/04/2022, no goals       Depression Screen    12/04/2022   12:02 PM 11/17/2021   11:19 AM 10/18/2021   10:49 AM 08/30/2021    3:34 PM 07/20/2021   11:58 AM 05/04/2021   11:19 AM  PHQ 2/9 Scores  PHQ - 2 Score 0 0 0 0 0 0  PHQ- 9 Score 0         Fall Risk    12/04/2022   12:02 PM 11/17/2021   11:18 AM 10/18/2021   10:49 AM 08/30/2021    3:29 PM 07/20/2021   11:58 AM  Fall Risk   Falls in the past year? 0 0 0 0 0  Number falls in past yr: 0  0  0  Injury with Fall? 0  0  0  Risk for fall due to : Medication side effect Medication side effect No Fall Risks  No Fall Risks  Follow up Falls prevention  discussed;Education provided;Falls evaluation completed Falls evaluation completed;Education provided;Falls prevention discussed Falls evaluation completed Falls evaluation completed;Education provided     FALL RISK PREVENTION PERTAINING TO THE HOME:  Any stairs in or around the home? Yes  If so, are there any without handrails? No  Home free of loose throw rugs in walkways, pet beds, electrical cords, etc? Yes  Adequate lighting in your home to reduce risk of falls? Yes   ASSISTIVE DEVICES UTILIZED TO PREVENT FALLS:  Life alert? No  Use of a cane, walker or w/c? No  Grab bars in the bathroom? Yes  Shower chair or bench in shower? No  Elevated toilet seat or a handicapped toilet? Yes   TIMED UP AND GO:  Was the test performed? No .      Cognitive Function:        12/04/2022   12:04 PM 11/17/2021   11:20 AM  6CIT Screen  What Year? 0 points 0 points  What month? 0 points 0 points  What time? 0 points 0 points  Count back from 20 0 points 0 points  Months in reverse 0 points 0 points  Repeat phrase 0 points 0 points  Total Score 0  points 0 points    Immunizations Immunization History  Administered Date(s) Administered   PFIZER(Purple Top)SARS-COV-2 Vaccination 02/09/2020, 03/01/2020, 11/05/2020   Pneumococcal Polysaccharide-23 05/04/2021   Tdap 07/11/2021    TDAP status: Up to date  Flu Vaccine status: Declined, Education has been provided regarding the importance of this vaccine but patient still declined. Advised may receive this vaccine at local pharmacy or Health Dept. Aware to provide a copy of the vaccination record if obtained from local pharmacy or Health Dept. Verbalized acceptance and understanding.  Pneumococcal vaccine status: Up to date  Covid-19 vaccine status: Completed vaccines  Qualifies for Shingles Vaccine? Yes   Zostavax completed No   Shingrix Completed?: No.    Education has been provided regarding the importance of this vaccine. Patient  has been advised to call insurance company to determine out of pocket expense if they have not yet received this vaccine. Advised may also receive vaccine at local pharmacy or Health Dept. Verbalized acceptance and understanding.  Screening Tests Health Maintenance  Topic Date Due   Zoster Vaccines- Shingrix (1 of 2) Never done   COLONOSCOPY (Pts 45-31yrs Insurance coverage will need to be confirmed)  Never done   DEXA SCAN  Never done   Diabetic kidney evaluation - Urine ACR  05/04/2022   Pneumonia Vaccine 8+ Years old (2 - PCV) 05/04/2022   FOOT EXAM  05/04/2022   COVID-19 Vaccine (4 - 2023-24 season) 07/20/2022   Medicare Annual Wellness (AWV)  11/17/2022   INFLUENZA VACCINE  02/17/2023 (Originally 06/19/2022)   HEMOGLOBIN A1C  05/01/2023   MAMMOGRAM  05/12/2023   OPHTHALMOLOGY EXAM  10/09/2023   Diabetic kidney evaluation - eGFR measurement  11/10/2023   DTaP/Tdap/Td (2 - Td or Tdap) 07/12/2031   Hepatitis C Screening  Completed   HPV VACCINES  Aged Out    Health Maintenance  Health Maintenance Due  Topic Date Due   Zoster Vaccines- Shingrix (1 of 2) Never done   COLONOSCOPY (Pts 45-33yrs Insurance coverage will need to be confirmed)  Never done   DEXA SCAN  Never done   Diabetic kidney evaluation - Urine ACR  05/04/2022   Pneumonia Vaccine 37+ Years old (2 - PCV) 05/04/2022   FOOT EXAM  05/04/2022   COVID-19 Vaccine (4 - 2023-24 season) 07/20/2022   Medicare Annual Wellness (AWV)  11/17/2022    Colorectal cancer screening: cologuard ordered today   Mammogram status: Completed 05/11/2021. Repeat every 2 years  Bone Density status: due  Lung Cancer Screening: (Low Dose CT Chest recommended if Age 9-80 years, 30 pack-year currently smoking OR have quit w/in 15years.) does not qualify.   Lung Cancer Screening Referral: no  Additional Screening:  Hepatitis C Screening: does qualify; Completed 07/02/2022  Vision Screening: Recommended annual ophthalmology exams for  early detection of glaucoma and other disorders of the eye. Is the patient up to date with their annual eye exam?  Yes  Who is the provider or what is the name of the office in which the patient attends annual eye exams? Dr. Luciana Axe If pt is not established with a provider, would they like to be referred to a provider to establish care? No .   Dental Screening: Recommended annual dental exams for proper oral hygiene  Community Resource Referral / Chronic Care Management: CRR required this visit?  No   CCM required this visit?  No      Plan:     I have personally reviewed and noted the following in the patient's  chart:   Medical and social history Use of alcohol, tobacco or illicit drugs  Current medications and supplements including opioid prescriptions. Patient is not currently taking opioid prescriptions. Functional ability and status Nutritional status Physical activity Advanced directives List of other physicians Hospitalizations, surgeries, and ER visits in previous 12 months Vitals Screenings to include cognitive, depression, and falls Referrals and appointments  In addition, I have reviewed and discussed with patient certain preventive protocols, quality metrics, and best practice recommendations. A written personalized care plan for preventive services as well as general preventive health recommendations were provided to patient.     Kellie Simmering, LPN   06/29/5725   Nurse Notes: none  Due to this being a virtual visit, the after visit summary with patients personalized plan was offered to patient via mail or my-chart. to pick up at office at next visit

## 2022-12-14 ENCOUNTER — Ambulatory Visit (INDEPENDENT_AMBULATORY_CARE_PROVIDER_SITE_OTHER): Payer: Medicare Other | Admitting: Medical

## 2022-12-14 VITALS — BP 170/80 | HR 60 | Wt 150.6 lb

## 2022-12-14 DIAGNOSIS — Z122 Encounter for screening for malignant neoplasm of respiratory organs: Secondary | ICD-10-CM | POA: Diagnosis not present

## 2022-12-14 DIAGNOSIS — Z87891 Personal history of nicotine dependence: Secondary | ICD-10-CM

## 2022-12-14 DIAGNOSIS — Z794 Long term (current) use of insulin: Secondary | ICD-10-CM | POA: Diagnosis not present

## 2022-12-14 DIAGNOSIS — Z79899 Other long term (current) drug therapy: Secondary | ICD-10-CM

## 2022-12-14 DIAGNOSIS — Z1211 Encounter for screening for malignant neoplasm of colon: Secondary | ICD-10-CM

## 2022-12-14 DIAGNOSIS — R0989 Other specified symptoms and signs involving the circulatory and respiratory systems: Secondary | ICD-10-CM

## 2022-12-14 DIAGNOSIS — Z7185 Encounter for immunization safety counseling: Secondary | ICD-10-CM

## 2022-12-14 DIAGNOSIS — E1101 Type 2 diabetes mellitus with hyperosmolarity with coma: Secondary | ICD-10-CM | POA: Diagnosis not present

## 2022-12-14 DIAGNOSIS — Z1231 Encounter for screening mammogram for malignant neoplasm of breast: Secondary | ICD-10-CM

## 2022-12-14 DIAGNOSIS — Q845 Enlarged and hypertrophic nails: Secondary | ICD-10-CM | POA: Diagnosis not present

## 2022-12-14 DIAGNOSIS — M05741 Rheumatoid arthritis with rheumatoid factor of right hand without organ or systems involvement: Secondary | ICD-10-CM | POA: Diagnosis not present

## 2022-12-14 DIAGNOSIS — N1831 Chronic kidney disease, stage 3a: Secondary | ICD-10-CM

## 2022-12-14 DIAGNOSIS — E785 Hyperlipidemia, unspecified: Secondary | ICD-10-CM

## 2022-12-14 DIAGNOSIS — I509 Heart failure, unspecified: Secondary | ICD-10-CM

## 2022-12-14 DIAGNOSIS — E1169 Type 2 diabetes mellitus with other specified complication: Secondary | ICD-10-CM | POA: Diagnosis not present

## 2022-12-14 DIAGNOSIS — L84 Corns and callosities: Secondary | ICD-10-CM

## 2022-12-14 DIAGNOSIS — E113592 Type 2 diabetes mellitus with proliferative diabetic retinopathy without macular edema, left eye: Secondary | ICD-10-CM

## 2022-12-14 DIAGNOSIS — R051 Acute cough: Secondary | ICD-10-CM

## 2022-12-14 DIAGNOSIS — R0981 Nasal congestion: Secondary | ICD-10-CM

## 2022-12-14 DIAGNOSIS — M05742 Rheumatoid arthritis with rheumatoid factor of left hand without organ or systems involvement: Secondary | ICD-10-CM

## 2022-12-14 DIAGNOSIS — I10 Essential (primary) hypertension: Secondary | ICD-10-CM

## 2022-12-14 MED ORDER — BENZONATATE 100 MG PO CAPS: 100.0000 mg | ORAL_CAPSULE | Freq: Two times a day (BID) | ORAL | 0 refills | Status: DC | PRN

## 2022-12-14 MED ORDER — AMOXICILLIN 875 MG PO TABS: 875.0000 mg | ORAL_TABLET | Freq: Two times a day (BID) | ORAL | 0 refills | Status: AC

## 2022-12-14 NOTE — Progress Notes (Signed)
Subjective: Chief Complaint  Patient presents with   follow-up    Follow-up on BP running 160-165 and diabetes- Sugar around 130-135   I saw her November 09, 2022.  At that time she is supposed to come back in 2 weeks for follow-up.  She is just now getting back in.  Here for recheck on diabetes and blood pressure and other concerns  She has been a patient here since 2022 but has not really come in for a well visit or routine screening, mainly problem-oriented visits and diabetes follow-ups  She has a history of heart failure and has not seen heart failure clinic since January of last year.  She has appointment coming up in about 2 weeks in February.  Last visit Jardiance was added to help with heart failure diagnosis and blood pressure and diabetes.  She is taking this without complaint.  She notes that she quit smoking within the last 2 months thankfully.  She has a long history of smoking  Home blood pressures are improved compared to last visit when she was running in the 200s systolic range.  She is compliant with her medications including Entresto, hydralazine, Lasix, spironolactone, Toprol.  She request a recommendation on vitamins  She request something to help with cough and congestion.  Every time her 30-year-old granddaughter is at the house with some kind of congestion she ends up picking it up from her.  She notes some mild cough and congestion for the last few days.  No wheezing, no shortness of breath.  No fever.  No body aches or chills.  No productive cough.  She declines influenza vaccine.  OSA-compliant with CPAP  Diabetes-blood sugars are running normal.  She is compliant with Tradjenta and recently added Jardiance  Unfortunately she has never really come in fasting despite recommendations.  She did eat some oatmeal this morning had some coffee without creamer  She is scared to get a colonoscopy.  She is agreeable to Cologuard  She is due for mammogram this year  and plans to get 1.  No prior chest CT and no prior colonoscopy  She is agreeable to COVID shot.  She is not tolerating her new medicine from her rheumatologist.  She has follow-up with them planned.  No other aggravating or relieving factors. No other complaint.   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   OU progressive, will medically need to have cataract surgery with lens will implantation so as to allow for complete clearance of the vitreous cavity in anticipation of likely vitrectomy in the future for progressive PDR despite previous PRP as delineated by the E DRS series of reports   Diabetes mellitus type 2 with complications (HCC)    diagnosed probably 2012 per patient   Diabetic retinopathy (HCC)    Hypertension    Rheumatoid arthritis (HCC)    Current Outpatient Medications on File Prior to Visit  Medication Sig Dispense Refill   acetaminophen (TYLENOL) 325 MG tablet Take 1-2 tablets (325-650 mg total) by mouth every 6 (six) hours as needed for mild pain (pain score 1-3 or temp > 100.5). (Patient taking differently: Take 325 mg by mouth as needed for mild pain.) 20 tablet 0   aspirin EC 81 MG tablet Take 81 mg by mouth every Monday, Wednesday, and Friday.     atorvastatin (LIPITOR) 40 MG tablet Take 1 tablet (40 mg total) by mouth daily.  90 tablet 3   empagliflozin (JARDIANCE) 10 MG TABS tablet Take 1 tablet (10 mg total) by mouth daily before breakfast. 30 tablet 2   ENTRESTO 97-103 MG TAKE 1 TABLET BY MOUTH TWICE DAILY (Patient taking differently: Take 1 tablet by mouth 2 (two) times daily.) 60 tablet 6   ferrous gluconate (FERGON) 324 MG tablet TAKE 1 TABLET(324 MG) BY MOUTH TWICE DAILY WITH A MEAL (Patient taking differently: Take 324 mg by mouth in the morning and at bedtime.) 180 tablet 1   furosemide (LASIX) 40 MG tablet Take 1 tablet (40 mg total) by mouth daily. (Patient taking differently:  Take 20 mg by mouth daily.) 90 tablet 3   hydrALAZINE (APRESOLINE) 50 MG tablet Take 1 tablet (50 mg total) by mouth every 8 (eight) hours. (Patient taking differently: Take 50 mg by mouth 3 (three) times daily.) 90 tablet 6   isosorbide mononitrate (IMDUR) 60 MG 24 hr tablet TAKE 1 TABLET(60 MG) BY MOUTH DAILY (Patient taking differently: Take 60 mg by mouth daily.) 30 tablet 11   latanoprost (XALATAN) 0.005 % ophthalmic solution Place 1 drop into both eyes at bedtime.     leflunomide (ARAVA) 20 MG tablet Take 1 tablet (20 mg total) by mouth daily. (Patient taking differently: Take 20 mg by mouth at bedtime.) 30 tablet 2   metoprolol succinate (TOPROL-XL) 50 MG 24 hr tablet TAKE 1 TABLET(50 MG) BY MOUTH DAILY WITH OR IMMEDIATELY FOLLOWING A MEAL (Patient taking differently: Take 50 mg by mouth daily.) 30 tablet 5   polyethylene glycol (MIRALAX / GLYCOLAX) 17 g packet Take 17 g by mouth daily as needed for mild constipation. 14 each 0   potassium chloride SA (KLOR-CON) 20 MEQ tablet Take 2 tablets (40 mEq total) by mouth 2 (two) times daily. 120 tablet 3   spironolactone (ALDACTONE) 25 MG tablet TAKE 1 TABLET(25 MG) BY MOUTH DAILY (Patient taking differently: Take 25 mg by mouth daily.) 90 tablet 0   TRADJENTA 5 MG TABS tablet TAKE 1 TABLET(5 MG) BY MOUTH DAILY (Patient taking differently: Take 5 mg by mouth daily.) 90 tablet 0   Vitamin D, Ergocalciferol, (DRISDOL) 1.25 MG (50000 UNIT) CAPS capsule Take 50,000 Units by mouth every Friday.     ondansetron (ZOFRAN) 4 MG tablet Take 1 tablet (4 mg total) by mouth every 8 (eight) hours as needed for nausea or vomiting. (Patient not taking: Reported on 12/14/2022) 20 tablet 0   No current facility-administered medications on file prior to visit.   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   Congestive heart failure, unspecified HF chronicity, unspecified heart failure type ([place]) Yes   Screening for lung cancer    Screen for colon cancer    Essential hypertension, benign    Hyperlipidemia associated with type 2 diabetes mellitus ([place])    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 2 diabetes mellitus, unspecified proliferative retinopathy type ([place])    Type 2 diabetes mellitus with hyperosmolar coma, with long-term current use of insulin (HCC)    Enlarged and hypertrophic nails    Rheumatoid arthritis involving both hands with positive rheumatoid factor (HCC)    Stage 3a chronic kidney disease (HCC)    Vaccine counseling    High risk medication use    Acute cough    Head congestion    Decreased pulse    Foot callus    Former smoker    Encounter for screening mammogram for malignant neoplasm of breast     Plan: Congestive heart failure-she last saw the heart failure clinic January of last year.  She has an appointment coming in with about 2  weeks.  I advise she make sure she keeps that appointment.  Continue her current medications.  Hypertension-not at goal but improved from last visit where she had hypertensive urgency.  Last visit along with coordination cardiology we added Jardiance which she is taking.  She is compliant with Entresto, Lasix 40 mg daily, spironolactone 25 mg daily, Toprol XL 50 mg daily, hydralazine 50 mg 3 times a day, potassium.  I will reach out to cardiology for some help on adjustments on medications today given pressure still quite high  Hyperlipidemia-continue Lipitor 40 mg daily and aspirin 81 mg daily  Long-term smoker, recently quit smoking within the past 2 months thankfully.  Referral placed for chest CT lung cancer screening.  She wants to do this after her visit with heart failure clinic coming up  Screening for colon cancer-no prior screening.  Referral for Cologuard  Screening for breast cancer-advise she get a mammogram this year  CKD 3-avoid NSAIDs, continue to hydrate well daily, continue current medications  Diabetes-recent hemoglobin A1c in December at goal.  Continue Tradjenta and recently started [place].  Continue daily foot checks  Hypertrophic nails and callus of foot-referral to podiatry for diabetic footcare and to monitor the callus  Rheumatoid arthritis-not tolerating current medicine.  Follow-up rheumatology is doing  She declines flu shot.  Counseled on the Covid virus vaccine.  Vaccine information sheet given.  Covid vaccine given after consent obtained.  Acute cough and congestion, mild cold symptoms.  Consider Coricidin HBP over-the-counter.  If much worse over the weekend particularly with fever, thick yellow-green mucus, worse rattly chest or wet cough then begin amoxicillin antibiotic.  You can use the [employer] as needed for cough but do not take them within 3 hours of the Coricidin HBP  You can use a daily over-the-counter multivitamin such as Centrum  Silver  Diabetes and decreased for pulses-referral for ABI blood flow screening placed today.  Expect a phone call in this    [place] was seen today for follow-up.  Diagnoses and all orders for this visit:  Congestive heart failure, unspecified HF chronicity, unspecified heart failure type ([place]) -     VAS [place] ABI WITH/WO TBI; Future -     CT CHEST LUNG CA SCREEN LOW DOSE W/O CM; Future  Screening for lung cancer -  CT CHEST LUNG CA SCREEN LOW DOSE W/O CM; Future  Screen for colon cancer -     Cologuard  Essential hypertension, benign -     Basic metabolic panel -     Microalbumin/Creatinine Ratio, Urine -     VAS [place] ABI WITH/WO TBI; Future  Hyperlipidemia associated with type 2 diabetes mellitus (HCC) -     Lipid panel -     VAS [place] ABI WITH/WO TBI; Future  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 2 diabetes mellitus, unspecified proliferative retinopathy type (HCC)  Type 2 diabetes mellitus with hyperosmolar coma, with long-term current use of insulin (HCC) -     Basic metabolic panel -     Microalbumin/Creatinine Ratio, Urine -     VAS [place] ABI WITH/WO TBI; Future -     Ambulatory referral to Podiatry  Enlarged and hypertrophic nails -     Ambulatory referral to Podiatry  Rheumatoid arthritis involving both hands with positive rheumatoid factor (HCC)  Stage 3a chronic kidney disease (HCC)  Vaccine counseling  High risk medication use  Acute cough  Head congestion  Decreased pulse -     VAS [place] ABI WITH/WO TBI; Future -     Ambulatory referral to Podiatry  Foot callus -     Ambulatory referral to Podiatry  Former smoker -     CT CHEST LUNG CA SCREEN LOW DOSE W/O CM; Future  Encounter for screening mammogram for malignant neoplasm of breast -     MM DIGITAL SCREENING BILATERAL; Future  Other orders -     amoxicillin (AMOXIL) 875 MG tablet; Take 1 tablet (875 mg total) by mouth 2 (two) times daily for 10 days. -      benzonatate (TESSALON) 100 MG capsule; Take 1 capsule (100 mg total) by mouth 2 (two) times daily as needed for cough.   Spent > 45 minutes face to face with patient in discussion of symptoms, evaluation, plan and recommendations.    F/u pending studies

## 2022-12-14 NOTE — Patient Instructions (Signed)
Congestive heart failure-she last saw the heart failure clinic January of last year.  She has an appointment coming in with about 2 weeks.  I advise she make sure she keeps that appointment.  Continue her current medications.  Hypertension-not at goal but improved from last visit where she had hypertensive urgency.  Last visit along with coordination cardiology we added Jardiance which she is taking.  She is compliant with Entresto, Lasix 40 mg daily, spironolactone 25 mg daily, Toprol XL 50 mg daily, hydralazine 50 mg 3 times a day, potassium.  I will reach out to cardiology for some help on adjustments on medications today given pressure still quite high  Hyperlipidemia-continue Lipitor 40 mg daily and aspirin 81 mg daily  Long-term smoker, recently quit smoking within the past 2 months thankfully.  Referral placed for chest CT lung cancer screening.  She wants to do this after her visit with heart failure clinic coming up  Screening for colon cancer-no prior screening.  Referral for Cologuard  Screening for breast cancer-advise she get a mammogram this year  CKD 3-avoid NSAIDs, continue to hydrate well daily, continue current medications  Diabetes-recent hemoglobin A1c in December at goal.  Continue Tradjenta and recently started [place].  Continue daily foot checks  Hypertrophic nails and callus of foot-referral to podiatry for diabetic footcare and to monitor the callus  Rheumatoid arthritis-not tolerating current medicine.  Follow-up rheumatology is doing  She declines flu shot.  Counseled on the Covid virus vaccine.  Vaccine information sheet given.  Covid vaccine given after consent obtained.  Acute cough and congestion, mild cold symptoms.  Consider Coricidin HBP over-the-counter.  If much worse over the weekend particularly with fever, thick yellow-green mucus, worse rattly chest or wet cough then begin amoxicillin antibiotic.  You can use the [employer] as needed for cough  but do not take them within 3 hours of the Coricidin HBP  You can use a daily over-the-counter multivitamin such as Centrum Silver  Diabetes and decreased for pulses-referral for ABI blood flow screening placed today.  Expect a phone call in this

## 2022-12-14 NOTE — Addendum Note (Signed)
Addended by: Minette Headland A on: 12/14/2022 02:23 PM   Modules accepted: Orders

## 2022-12-17 ENCOUNTER — Other Ambulatory Visit: Payer: Self-pay | Admitting: Medical

## 2022-12-17 MED ORDER — HYDRALAZINE HCL 50 MG PO TABS: 75.0000 mg | ORAL_TABLET | Freq: Three times a day (TID) | ORAL | 2 refills | Status: DC

## 2022-12-17 NOTE — Progress Notes (Signed)
I did reach out to Dr. Haroldine Laws, heart failure clinic.  He recommended we increase the hydralazine 75 mg 3 times a day.  However before we do this lets be absolutely clear, you are taking all of your blood pressures currently every single day correct?  Kidney marker stable, still pending microalbumin other kidney marker, cholesterol overall pretty good but we still would want the LDL cholesterol under 50.  It is currently 3.  Are you taking your Lipitor a atorvastatin 40 mg every single day?  If so 1 option is to add a medicine called Repatha which is a every 2-week injections to get the cholesterol further to go.  Another option is to make any dietary changes if you are eating high cholesterol foods such as lots of cheese, lots of red meat, processed foods such as hot dogs or cake or pies or donuts for example.  You are on enough pills already, I do not want to add another pill

## 2022-12-20 ENCOUNTER — Other Ambulatory Visit: Payer: Self-pay | Admitting: Medical

## 2022-12-23 ENCOUNTER — Other Ambulatory Visit: Payer: Self-pay | Admitting: Medical

## 2022-12-23 ENCOUNTER — Other Ambulatory Visit: Payer: Self-pay | Admitting: Internal Medicine

## 2022-12-23 NOTE — Progress Notes (Signed)
Office Visit Note  Patient: Maria Foster             Date of Birth: [birth year]           MRN: [id number]             PCP: Carlena Hurl, PA-C Referring: Carlena Hurl, PA-C Visit Date: 12/24/2022   Subjective:  Follow-up   History of Present Illness: Maria Foster is a 67 y.o. 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 daily.  Unfortunately had interruption with starting due to hospital admission for systemic inflammation starting from urinary tract infection.  Joint pain symptoms have not changed significantly.  She is having issue with ongoing nonproductive cough.  Also with frequent eye watering and some irritation no vision changes and no associated swelling.  She has upcoming evaluations for her lungs and congestive heart failure.   Previous HPI 09/26/22 Maria Foster is a 67 y.o. female here for follow up for seropositive RA we did not start methotrexate as planned due to renal impairment worse than baseline checked at clinic appointment.  Symptoms remain about the same and also not seeing any worsening with stopping hydroxychloroquine.   Previous HPI 07/02/2022 Maria Foster is a 67 y.o. 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  She feels like symptoms have made no significant difference on this treatment.  No recurrence of severe flareups like she had in her elbow either.  She does notice a few new and expanding nodules with episodes of spontaneous rupturing and white fluid drainage. Does not have much joint pain or swelling outside of the nodules.   Previous HPI 01/01/2022 Maria Foster is a 67 y.o. female here for follow up for seropositive RA on HCQ 200 mg daily. After our last visit with right elbow injection she developed worsening pain and then purulent drainage found to be MSSA infection of that right olecranon bursa and went to the hospital for surgical debridement of the wound.  Wound culture was obtained also surgical report of rheumatoid nodule at the site. She took a course of antibiotics with PICC line completed without incident. She resumed hydroxychloroquine after this and she feels her joint pain is very well controlled at this time. She notices some increase in stiffness and pain with cold and changing weather.   Previous HPI 07/03/21 Maria Foster is a 67 y.o. female here for follow up for seropositive RA and chronic tophaceous gout. Recommended starting hydroxychloroquine treatment 200 mg PO daily for suspected RA as active disease and normal uric levels. She feels her symptoms are improved since starting this, particularly states pain with weather and temperature changes are decreased. She has right elbow pain more than elsewhere says this hurts mostly due to pressure on the site when lying in bed or certain seated positions.   Previous HPI: 03/29/21 Maria Foster is a 67 y.o. female referred here for evaluation of rheumatoid arthritis.  She has a longstanding history of bilateral hand and knee pain that has been going on for years with some progressive worsening and deformity over time.  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  She also has chronic knee pain and stiffness bilaterally without any large nodular  changes.  She does not have much joint pain most days however experiences some episodically and also feels that her hands and knees hurt worse with cold and rainy weather.  She describes a history of rheumatoid arthritis but has never been on treatment plans for this.  She states she has never had any episode of gout involving the toes or feet but has been told by her cardiologist this is a problem that she has.   She does not recall ever undergoing bone density screening  for osteoporosis. She has a long smoking history but in recent months is either not smoking or very reduced usage most days.       Review of Systems  Constitutional:  Negative for fatigue.  HENT:  Negative for mouth sores and mouth dryness.   Eyes:  Negative for dryness.  Respiratory:  Negative for shortness of breath.   Cardiovascular:  Negative for chest pain and palpitations.  Gastrointestinal:  Negative for blood in stool, constipation and diarrhea.  Endocrine: Negative for increased urination.  Genitourinary:  Negative for involuntary urination.  Musculoskeletal:  Positive for joint pain and joint pain. Negative for gait problem, joint swelling, myalgias, muscle weakness, morning stiffness, muscle tenderness and myalgias.  Skin:  Negative for color change, rash, hair loss and sensitivity to sunlight.  Allergic/Immunologic: Positive for susceptible to infections.  Neurological:  Negative for dizziness and headaches.  Hematological:  Negative for swollen glands.  Psychiatric/Behavioral:  Positive for sleep disturbance. Negative for depressed mood. The patient is not nervous/anxious.     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 type 2 diabetes mellitus ([place]) 11/16/2021   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 2 diabetes mellitus ([place]) 11/16/2021   OSA (obstructive sleep apnea) 08/25/2021   Iron deficiency 08/25/2021   Screening for cancer 08/25/2021   Influenza vaccination declined 08/25/2021   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   Stage 3a chronic  kidney disease ([place]) 07/11/2021   Septic olecranon bursitis of right elbow 07/11/2021   Essential hypertension, benign 05/04/2021   Hyperlipidemia associated with type 2 diabetes mellitus ([place]) 05/04/2021   Enlarged and hypertrophic nails 05/04/2021   Encounter for screening mammogram for malignant neoplasm of breast 05/04/2021   Need for pneumococcal vaccination 05/04/2021   CHF (congestive heart failure) ([place]) 05/04/2021   Stable angina 05/04/2021   Vaccine counseling 05/04/2021   Tobacco use 05/04/2021   High risk medication use 03/29/2021   Screen for colon cancer 01/27/2021   Tophi 01/11/2021   Rheumatoid arthritis involving both hands ([place]) 01/11/2021   Elevated troponin    Type 2 diabetes mellitus ([place]) 12/19/2020   Constipation 12/19/2020    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   OU progressive, will medically need to have cataract surgery with lens will implantation so as to allow for complete clearance of the vitreous cavity in anticipation of likely vitrectomy in the future for progressive PDR despite previous PRP as delineated by the E DRS series of reports   Diabetes mellitus type 2 with complications (  [place])    diagnosed probably 2012 per patient   Diabetic retinopathy ([place])    Hypertension    Rheumatoid arthritis ([place])     Family History  Problem Relation Age of Onset   Diabetes Mother    Hypertension Mother    Heart Problems Sister    Amblyopia Neg Hx    Blindness Neg Hx    Cataracts Neg Hx    Glaucoma Neg Hx    Macular degeneration Neg Hx    Retinal detachment Neg Hx    Strabismus Neg Hx    Retinitis pigmentosa Neg Hx    Past Surgical History:  Procedure Laterality Date   INCISION AND DRAINAGE ABSCESS N/A 07/11/2021   Procedure: INCISION AND DRAINAGE RIGHT ELBOW;  Surgeon: Paralee Cancel, MD;  Location: [facility];  Service: Orthopedics;  Laterality: N/A;   PRP Bilateral OD -  07/23/2017 OS - 08/06/2017   RIGHT/LEFT HEART CATH AND CORONARY ANGIOGRAPHY N/A 01/05/2021   Procedure: RIGHT/LEFT HEART CATH AND CORONARY ANGIOGRAPHY;  Surgeon: Jolaine Artist, MD;  Location: [facility];  Service: Cardiovascular;  Laterality: N/A;   TEE WITHOUT CARDIOVERSION N/A 10/09/2021   Procedure: TRANSESOPHAGEAL ECHOCARDIOGRAM (TEE);  Surgeon: Jolaine Artist, MD;  Location: [facility];  Service: Cardiovascular;  Laterality: N/A;   Social History   Social History Narrative   Not on file   Immunization History  Administered Date(s) Administered   COVID-19, mRNA, vaccine(Comirnaty)12 years and older 12/14/2022   PFIZER(Purple Top)SARS-COV-2 Vaccination 02/09/2020, 03/01/2020, 11/05/2020   Pneumococcal Polysaccharide-23 05/04/2021   Tdap 07/11/2021     Objective: Vital Signs: BP (!) 213/82 (BP Location: Left Arm, Patient Position: Sitting, Cuff Size: Normal)   Pulse 63   Resp 16   Ht '5\' 3"'$  (1.6 m)   Wt 151 lb (68.5 kg)   BMI 26.75 kg/m    Physical Exam Cardiovascular:     Rate and Rhythm: Normal rate and regular rhythm.  Pulmonary:     Effort: Pulmonary effort is normal.     Breath sounds: Normal breath sounds.  Lymphadenopathy:     Cervical: No cervical adenopathy.  Skin:    General: Skin is warm and dry.  Neurological:     Mental Status: She is alert.  Psychiatric:        Mood and Affect: Mood normal.      Musculoskeletal Exam:  Shoulders full ROM no tenderness or swelling Elbows, wrists, and finger joints with full ROM no tenderness or palpable synovitis, nontender nodules overlying elbow and forearm extensor surfaces, over dorsum of MCPs and several small nodules throughout fingers, no palpable synovitis warmth or erythema Knees full ROM no tenderness or swelling Ankles full ROM no tenderness or swelling  CDAI Exam: CDAI Score: 6  Patient Global: 20 mm; Provider Global: 40 mm Swollen: 0 ; Tender: 0  Joint Exam 12/24/2022   All  documented joints were normal     Investigation: No additional findings.  Imaging: VAS [place] ABI WITH/WO TBI  Result Date: 01/08/2023  LOWER EXTREMITY DOPPLER STUDY Patient Name:  SHARENE PINKHAM  Date of Exam:   01/07/2023 Medical Rec #: [id number]        Accession #:    [id number] Date of Birth: Dec 29, 1955        Patient Gender: F Patient Age:   86 years Exam Location:  [facility] Procedure:      VAS [place] ABI WITH/WO TBI Referring Phys: Chana Bode --------------------------------------------------------------------------------  Indications: Peripheral artery disease. High  Risk Factors: Hypertension, Diabetes, past history of smoking.  Performing Technologist: Alvia Grove RVT  Examination Guidelines: A complete evaluation includes at minimum, Doppler waveform signals and systolic blood pressure reading at the level of bilateral brachial, anterior tibial, and posterior tibial arteries, when vessel segments are accessible. Bilateral testing is considered an integral part of a complete examination. Photoelectric Plethysmograph (PPG) waveforms and toe systolic pressure readings are included as required and additional duplex testing as needed. Limited examinations for reoccurring indications may be performed as noted.  ABI Findings: +---------+------------------+-----+----------+--------+ Right    Rt Pressure (mmHg)IndexWaveform  Comment  +---------+------------------+-----+----------+--------+ Brachial 193                                       +---------+------------------+-----+----------+--------+ PTA      108               0.52 monophasic         +---------+------------------+-----+----------+--------+ DP       114               0.55 monophasic         +---------+------------------+-----+----------+--------+ Great Toe60                0.29 Abnormal           +---------+------------------+-----+----------+--------+  +---------+------------------+-----+----------+-------+ Left     Lt Pressure (mmHg)IndexWaveform  Comment +---------+------------------+-----+----------+-------+ Brachial 207                                      +---------+------------------+-----+----------+-------+ PTA      108               0.52 monophasic        +---------+------------------+-----+----------+-------+ DP       103               0.50 monophasic        +---------+------------------+-----+----------+-------+ Great Toe51                0.25 Abnormal          +---------+------------------+-----+----------+-------+ +-------+-----------+-----------+------------+------------+ ABI/TBIToday's ABIToday's TBIPrevious ABIPrevious TBI +-------+-----------+-----------+------------+------------+ Right  0.55       0.29                                +-------+-----------+-----------+------------+------------+ Left   0.52       0.25                                +-------+-----------+-----------+------------+------------+   Summary: Right: Resting right ankle-brachial index indicates moderate right lower extremity arterial disease. The right toe-brachial index is abnormal. Left: Resting left ankle-brachial index indicates moderate left lower extremity arterial disease. The left toe-brachial index is abnormal. *See table(s) above for measurements and observations.  Electronically signed by Orlie Pollen on 01/08/2023 at 12:40:59 PM.    Final     Recent Labs: Lab Results  Component Value Date   WBC 7.9 11/09/2022   HGB 12.7 11/09/2022   PLT 204 11/09/2022   NA 137 12/24/2022   K 4.5 12/24/2022   CL 105 12/24/2022   CO2 25 12/24/2022   GLUCOSE 174 (H) 12/24/2022   BUN 24 12/24/2022   CREATININE 1.15 (H)  12/24/2022   BILITOT 0.5 12/24/2022   ALKPHOS 85 10/30/2022   AST 11 12/24/2022   ALT 10 12/24/2022   PROT 7.6 12/24/2022   ALBUMIN 3.4 (L) 10/30/2022   CALCIUM 9.3 12/24/2022   GFRAA >60 11/29/2016     Speciality Comments: No specialty comments available.  Procedures:  No procedures performed Allergies: Metformin and related and Farxiga [dapagliflozin]   Assessment / Plan:     Visit Diagnoses: Rheumatoid arthritis involving both hands with positive rheumatoid factor ([place]) - Plan: Sedimentation rate, C-reactive protein  She is having some stiffness but no severe flareup of pain or peripheral synovitis on exam today.  Will check sed rate and CRP for inflammatory disease activity monitoring.  Initial plan is to get back onto the leflunomide 20 mg p.o. daily as planned to assess response to this treatment.  High risk medication use - Plan: COMPLETE METABOLIC PANEL WITH GFR  Checking metabolic panel for medication monitoring on the leflunomide.  Although she had recent interruption in treatment.  Recent CBC reviewed is normal.  Stage 3a chronic kidney disease ([place])  Checking metabolic panel as above.  Blood pressure is very uncontrolled today the patient has not taken her usual antihypertensive medications which I recommend she do after the encounter.  Tophi  Extensive subcutaneous nodules some thought to be tophaceous deposits but with rheumatoid nodule on pathology from the previous olecranon bursitis excision.  No significant reduction in size or number seen so far.  Orders: Orders Placed This Encounter  Procedures   Sedimentation rate   COMPLETE METABOLIC PANEL WITH GFR   C-reactive protein   No orders of the defined types were placed in this encounter.    Follow-Up Instructions: Return in about 3 months (around 03/24/2023) for RA/gout on LEF f/u 77mo.   CCollier Salina MD  Note - This record has been created using [employer]  Chart creation errors have been sought, but may not always  have been located. Such creation errors do not reflect on  the standard of medical care.

## 2022-12-24 ENCOUNTER — Ambulatory Visit: Payer: Medicare Other | Attending: Internal Medicine | Admitting: Internal Medicine

## 2022-12-24 ENCOUNTER — Encounter: Payer: Self-pay | Admitting: Internal Medicine

## 2022-12-24 VITALS — BP 213/82 | HR 63 | Resp 16 | Ht 63.0 in | Wt 151.0 lb

## 2022-12-24 DIAGNOSIS — M05741 Rheumatoid arthritis with rheumatoid factor of right hand without organ or systems involvement: Secondary | ICD-10-CM | POA: Diagnosis not present

## 2022-12-24 DIAGNOSIS — M1A9XX1 Chronic gout, unspecified, with tophus (tophi): Secondary | ICD-10-CM | POA: Diagnosis not present

## 2022-12-24 DIAGNOSIS — N1831 Chronic kidney disease, stage 3a: Secondary | ICD-10-CM | POA: Diagnosis not present

## 2022-12-24 DIAGNOSIS — M05742 Rheumatoid arthritis with rheumatoid factor of left hand without organ or systems involvement: Secondary | ICD-10-CM

## 2022-12-24 DIAGNOSIS — Z79899 Other long term (current) drug therapy: Secondary | ICD-10-CM

## 2022-12-25 LAB — COMPLETE METABOLIC PANEL WITH GFR
AG Ratio: 1.1 (calc) (ref 1.0–2.5)
ALT: 10 U/L (ref 6–29)
AST: 11 U/L (ref 10–35)
Albumin: 3.9 g/dL (ref 3.6–5.1)
Alkaline phosphatase (APISO): 115 U/L (ref 37–153)
BUN/Creatinine Ratio: 21 (calc) (ref 6–22)
BUN: 24 mg/dL (ref 7–25)
CO2: 25 mmol/L (ref 20–32)
Calcium: 9.3 mg/dL (ref 8.6–10.4)
Chloride: 105 mmol/L (ref 98–110)
Creat: 1.15 mg/dL — ABNORMAL HIGH (ref 0.50–1.05)
Globulin: 3.7 g/dL (calc) (ref 1.9–3.7)
Glucose, Bld: 174 mg/dL — ABNORMAL HIGH (ref 65–99)
Potassium: 4.5 mmol/L (ref 3.5–5.3)
Sodium: 137 mmol/L (ref 135–146)
Total Bilirubin: 0.5 mg/dL (ref 0.2–1.2)
Total Protein: 7.6 g/dL (ref 6.1–8.1)
eGFR: 53 mL/min/{1.73_m2} — ABNORMAL LOW (ref >=60)

## 2022-12-25 LAB — C-REACTIVE PROTEIN: CRP: 14.2 mg/L — ABNORMAL HIGH (ref <8.0)

## 2022-12-25 LAB — SEDIMENTATION RATE: Sed Rate: 56 mm/h — ABNORMAL HIGH (ref 0–30)

## 2022-12-26 ENCOUNTER — Ambulatory Visit (INDEPENDENT_AMBULATORY_CARE_PROVIDER_SITE_OTHER): Payer: Medicare Other | Admitting: Podiatry

## 2022-12-26 VITALS — BP 170/80

## 2022-12-26 DIAGNOSIS — M79675 Pain in left toe(s): Secondary | ICD-10-CM

## 2022-12-26 DIAGNOSIS — M79674 Pain in right toe(s): Secondary | ICD-10-CM | POA: Diagnosis not present

## 2022-12-26 DIAGNOSIS — B351 Tinea unguium: Secondary | ICD-10-CM

## 2022-12-26 NOTE — Progress Notes (Signed)
   Chief Complaint  Patient presents with   Diabetes    Diabetic foot care, nail trim, A1c-7.2 BG- 141    SUBJECTIVE Patient with a history of diabetes mellitus presents to office today complaining of elongated, thickened nails that cause pain while ambulating in shoes.  Patient is unable to trim their own nails. Patient is here for further evaluation and treatment.  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   OU progressive, will medically need to have cataract surgery with lens will implantation so as to allow for complete clearance of the vitreous cavity in anticipation of likely vitrectomy in the future for progressive PDR despite previous PRP as delineated by the E DRS series of reports   Diabetes mellitus type 2 with complications ([place])    diagnosed probably 2012 per patient   Diabetic retinopathy ([place])    Hypertension    Rheumatoid arthritis ([place])     Allergies  Allergen Reactions   Metformin And Related Other (See Comments)    Kidney injury, GI upset, diarrhea   Farxiga [Dapagliflozin] Other (See Comments)    Yeast infections, recurrent     OBJECTIVE General Patient is awake, alert, and oriented x 3 and in no acute distress. Derm Skin is dry and supple bilateral. Negative open lesions or macerations. Remaining integument unremarkable. Nails are tender, long, thickened and dystrophic with subungual debris, consistent with onychomycosis, 1-5 bilateral. No signs of infection noted. Vasc  DP and PT pedal pulses palpable bilaterally. Temperature gradient within normal limits.  Neuro Epicritic and protective threshold sensation diminished bilaterally.  Musculoskeletal Exam No symptomatic pedal deformities noted bilateral. Muscular strength within normal limits.  ASSESSMENT 1. Diabetes Mellitus w/ peripheral neuropathy 2.  Pain due to onychomycosis of toenails bilateral  PLAN OF CARE 1. Patient  evaluated today. 2. Instructed to maintain good pedal hygiene and foot care. Stressed importance of controlling blood sugar.  3. Mechanical debridement of nails 1-5 bilaterally performed using a nail nipper. Filed with dremel without incident.  4. Return to clinic in 3 mos.     Edrick Kins, DPM [facility]  Dr. Edrick Kins, DPM    [address], [place] [address]                Office [phone]  Fax [phone]

## 2022-12-27 ENCOUNTER — Ambulatory Visit (HOSPITAL_COMMUNITY): Payer: Medicare Other

## 2023-01-01 LAB — BASIC METABOLIC PANEL
BUN/Creatinine Ratio: 19 (ref 12–28)
BUN: 22 mg/dL (ref 8–27)
CO2: 21 mmol/L (ref 20–29)
Calcium: 9.2 mg/dL (ref 8.7–10.3)
Chloride: 102 mmol/L (ref 96–106)
Creatinine, Ser: 1.13 mg/dL — ABNORMAL HIGH (ref 0.57–1.00)
Glucose: 128 mg/dL — ABNORMAL HIGH (ref 70–99)
Potassium: 4 mmol/L (ref 3.5–5.2)
Sodium: 137 mmol/L (ref 134–144)
eGFR: 54 mL/min/{1.73_m2} — ABNORMAL LOW (ref >59)

## 2023-01-01 LAB — LIPID PANEL
Chol/HDL Ratio: 2.4 ratio (ref 0.0–4.4)
Cholesterol, Total: 125 mg/dL (ref 100–199)
HDL: 52 mg/dL (ref >39)
LDL Chol Calc (NIH): 61 mg/dL (ref 0–99)
Triglycerides: 54 mg/dL (ref 0–149)
VLDL Cholesterol Cal: 12 mg/dL (ref 5–40)

## 2023-01-01 LAB — MICROALBUMIN / CREATININE URINE RATIO

## 2023-01-02 ENCOUNTER — Encounter (HOSPITAL_COMMUNITY): Payer: Self-pay | Admitting: Internal Medicine

## 2023-01-02 ENCOUNTER — Ambulatory Visit (HOSPITAL_COMMUNITY)
Admission: RE | Admit: 2023-01-02 | Discharge: 2023-01-02 | Disposition: A | Discharge status: Home / Self Care | Payer: Medicare Other | Source: Ambulatory Visit | Attending: Internal Medicine | Admitting: Internal Medicine

## 2023-01-02 VITALS — BP 212/80 | HR 60 | Wt 152.0 lb

## 2023-01-02 DIAGNOSIS — G4730 Sleep apnea, unspecified: Secondary | ICD-10-CM | POA: Diagnosis not present

## 2023-01-02 DIAGNOSIS — I2582 Chronic total occlusion of coronary artery: Secondary | ICD-10-CM | POA: Insufficient documentation

## 2023-01-02 DIAGNOSIS — E1122 Type 2 diabetes mellitus with diabetic chronic kidney disease: Secondary | ICD-10-CM | POA: Insufficient documentation

## 2023-01-02 DIAGNOSIS — I5040 Unspecified combined systolic (congestive) and diastolic (congestive) heart failure: Secondary | ICD-10-CM | POA: Diagnosis not present

## 2023-01-02 DIAGNOSIS — Z7982 Long term (current) use of aspirin: Secondary | ICD-10-CM | POA: Diagnosis not present

## 2023-01-02 DIAGNOSIS — I340 Nonrheumatic mitral (valve) insufficiency: Secondary | ICD-10-CM

## 2023-01-02 DIAGNOSIS — N1831 Chronic kidney disease, stage 3a: Secondary | ICD-10-CM | POA: Diagnosis not present

## 2023-01-02 DIAGNOSIS — I5082 Biventricular heart failure: Secondary | ICD-10-CM | POA: Insufficient documentation

## 2023-01-02 DIAGNOSIS — M069 Rheumatoid arthritis, unspecified: Secondary | ICD-10-CM | POA: Insufficient documentation

## 2023-01-02 DIAGNOSIS — I130 Hypertensive heart and chronic kidney disease with heart failure and stage 1 through stage 4 chronic kidney disease, or unspecified chronic kidney disease: Secondary | ICD-10-CM | POA: Diagnosis not present

## 2023-01-02 DIAGNOSIS — Z79899 Other long term (current) drug therapy: Secondary | ICD-10-CM | POA: Insufficient documentation

## 2023-01-02 DIAGNOSIS — I2510 Atherosclerotic heart disease of native coronary artery without angina pectoris: Secondary | ICD-10-CM | POA: Insufficient documentation

## 2023-01-02 DIAGNOSIS — E1136 Type 2 diabetes mellitus with diabetic cataract: Secondary | ICD-10-CM | POA: Insufficient documentation

## 2023-01-02 DIAGNOSIS — I160 Hypertensive urgency: Secondary | ICD-10-CM

## 2023-01-02 MED ORDER — HYDRALAZINE HCL 100 MG PO TABS: 100.0000 mg | ORAL_TABLET | Freq: Three times a day (TID) | ORAL | 3 refills | Status: DC

## 2023-01-02 MED ORDER — CLONIDINE HCL 0.1 MG PO TABS: 0.1000 mg | ORAL_TABLET | Freq: Once | ORAL | 0 refills | Status: DC

## 2023-01-02 NOTE — Progress Notes (Incomplete)
[facility]  PCP: Dr Elonda Husky  Cardiologist: Dr. Oval Linsey HF Cardiologist: Dr. Haroldine Laws  Reason for Visit: Heart Failure   HPI: Maria Foster is a 67 y.o.  female  with DM2, HTN, obesity, CKD 3a , CAD, MR, chornic systolic HF, RA, and tobacco use.        Admitted 12/19/20 with new onset HF. On admit also hypertensive with AKI. 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 She was diuresed with IV lasix and cardiology was consulted. Discharge weight was 144.5 lbs. Underwent cath 2/22 with 3v CAD with totally occluded RCA with L -> R collaterals, 80% lesion in mLCX and non-obstructive CAD in LAD. EF 40% with elevated filling pressures and normal CO. Given lack of angina, she was treated medically. 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  Echo 6/22 EF 55%. 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 Had run out of hydralazine and spiro. She was stable from symptom standpoint, NYHA Class II. Hydral and spiro were refilled and Entresto increased to 97-103 bid.   She was admitted to [facility] 8/22 for septic arthritis involving the rt elbow. This was after receiving steroid injection. Synovial fluid cx grew staph aureus. Blood cultures negative. Taken for washout and placed on IV abx x 4 weeks. Completed antibiotics.  Echo 11/22: EF 55%. Mod -severe MR Mild AI.   TEE 10/09/21: EF 55%, RV okay, mildly rheumatic MV with mild to moderate MR (SBP 140-170), mild AI  Echo 12/23:  EF  read as 45-50%  (I felt 55-60%) mild-mod MR mild AI  Here today for HF f/u. Reports she has been taking all of her medications as prescribed. Home blood pressures have been trending 150-160 range. Stopped smoking cold [place] 2-3 months ago Denies CP, SOB, edema, orthopnea or PND.    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  Echo 11/22: EF 55%. Mod -severe MR Mild AI TEE 10/09/21: EF 55%, RV okay, mildly rheumatic MV with mild to moderate MR (SBP 140-170), mild AI  R/LHC (2/22): Ost RCA to Prox RCA lesion is 100% stenosed. Mid Cx lesion is 80% stenosed. 1st Mrg lesion is 100% stenosed. Ramus lesion is 30% stenosed. Mid LM to Dist LM lesion is 20% stenosed. Prox LAD lesion is 30% stenosed. Dist RCA lesion is 100% stenosed. Findings: Ao = 154.73 (104) LV = 143/20 RA =  13 RV = 65/15 PA = 60/24 (37)  PCW = 27 (v = 40) Fick cardiac output/index = 4.7/2.7 PVR = 2.1 WU SVR = 1552 Ao sat = 97% PA sat = 64%, 64% Assessment: 1. 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 80% lesion in mLCX 3. Mild non-obstructive CAD in LAD 4. 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 Treat medically for now. If develops anginal symptoms can consider PCI mLCX. Needs diuresis first.   ROS: All systems negative except as listed in HPI, PMH and Problem List.  SH:  Social History   Socioeconomic History   Marital status: Married    Spouse name: Not on file   Number  of children: Not on file   Years of education: Not on file   Highest education level: Not on file  Occupational History   Occupation: retired  Tobacco Use   Smoking status: Former    Packs/day: 0.50    Years: 15.00    Total pack years: 7.50    Types: Cigarettes    Quit date: 09/2022    Years since quitting: 0.2    Passive exposure: Past   Smokeless tobacco: Never   Tobacco comments:    Quit 10/28/2022  Vaping Use   Vaping Use: Never used  Substance and Sexual Activity   Alcohol use: Not Currently   Drug use: No   Sexual activity: Not on file  Other Topics Concern   Not on file  Social History Narrative   Not on file    Social Determinants of Health   Financial Resource Strain: Low Risk  (12/04/2022)   Overall Financial Resource Strain (CARDIA)    Difficulty of Paying Living Expenses: Not hard at all  Food Insecurity: No Food Insecurity (12/04/2022)   Hunger Vital Sign    Worried About Running Out of Food in the Last Year: Never true    [place] in the Last Year: Never true  Transportation Needs: No Transportation Needs (12/04/2022)   PRAPARE - Hydrologist (Medical): No    Lack of Transportation (Non-Medical): No  Physical Activity: Sufficiently Active (12/04/2022)   Exercise Vital Sign    Days of Exercise per Week: 4 days    Minutes of Exercise per Session: 60 min  Stress: No Stress Concern Present (12/04/2022)   [place]    Feeling of Stress : Not at all  Social Connections: Not on file  Intimate Partner Violence: Not At Risk (10/30/2022)   Humiliation, Afraid, Rape, and Kick questionnaire    Fear of Current or Ex-Partner: No    Emotionally Abused: No    Physically Abused: No    Sexually Abused: No   FH:  Family History  Problem Relation Age of Onset   Diabetes Mother    Hypertension Mother    Heart Problems Sister    Amblyopia Neg Hx    Blindness Neg Hx    Cataracts Neg Hx    Glaucoma Neg Hx    Macular degeneration Neg Hx    Retinal detachment Neg Hx    Strabismus Neg Hx    Retinitis pigmentosa Neg Hx    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   OU progressive, will medically need to have cataract surgery with lens will implantation so as to allow for complete clearance of the vitreous cavity in anticipation of likely vitrectomy in the future for progressive PDR despite previous PRP as delineated by the E DRS series of reports   Diabetes mellitus type 2 with complications ([place])    diagnosed probably 2012  per patient   Diabetic retinopathy ([place])    Hypertension    Rheumatoid arthritis ([place])    Current Outpatient Medications  Medication Sig Dispense Refill   acetaminophen (TYLENOL) 325 MG tablet Take 1-2 tablets (325-650 mg total) by mouth every 6 (six) hours as needed for mild pain (pain score 1-3 or temp > 100.5). 20 tablet 0   aspirin EC 81 MG tablet Take 81 mg by mouth every Monday, Wednesday, and Friday.  atorvastatin (LIPITOR) 40 MG tablet Take 1 tablet (40 mg total) by mouth daily. 90 tablet 3   benzonatate (TESSALON) 100 MG capsule Take 1 capsule (100 mg total) by mouth 2 (two) times daily as needed for cough. 20 capsule 0   empagliflozin (JARDIANCE) 10 MG TABS tablet Take 1 tablet (10 mg total) by mouth daily before breakfast. 30 tablet 2   ferrous gluconate (FERGON) 324 MG tablet TAKE 1 TABLET(324 MG) BY MOUTH TWICE DAILY WITH A MEAL 180 tablet 1   furosemide (LASIX) 40 MG tablet Take 1 tablet (40 mg total) by mouth daily. 90 tablet 3   hydrALAZINE (APRESOLINE) 50 MG tablet Take 1.5 tablets (75 mg total) by mouth 3 (three) times daily. 135 tablet 2   isosorbide mononitrate (IMDUR) 60 MG 24 hr tablet TAKE 1 TABLET(60 MG) BY MOUTH DAILY 30 tablet 11   latanoprost (XALATAN) 0.005 % ophthalmic solution Place 1 drop into both eyes at bedtime.     leflunomide (ARAVA) 20 MG tablet Take 1 tablet (20 mg total) by mouth daily. 30 tablet 2   linagliptin (TRADJENTA) 5 MG TABS tablet Take 1 tablet (5 mg total) by mouth daily. 90 tablet 1   metoprolol succinate (TOPROL-XL) 50 MG 24 hr tablet TAKE 1 TABLET(50 MG) BY MOUTH DAILY WITH OR IMMEDIATELY FOLLOWING A MEAL 90 tablet 1   ondansetron (ZOFRAN) 4 MG tablet Take 1 tablet (4 mg total) by mouth every 8 (eight) hours as needed for nausea or vomiting. 20 tablet 0   polyethylene glycol (MIRALAX / GLYCOLAX) 17 g packet Take 17 g by mouth daily as needed for mild constipation. 14 each 0   potassium chloride SA (KLOR-CON) 20 MEQ tablet Take 2 tablets  (40 mEq total) by mouth 2 (two) times daily. 120 tablet 3   sacubitril-valsartan (ENTRESTO) 97-103 MG TAKE 1 TABLET BY MOUTH TWICE DAILY. NEED FOLLOW UP APPOINTMENT FOR ANYMORE REFILLS 60 tablet 0   spironolactone (ALDACTONE) 25 MG tablet TAKE 1 TABLET(25 MG) BY MOUTH DAILY 90 tablet 1   Vitamin D, Ergocalciferol, (DRISDOL) 1.25 MG (50000 UNIT) CAPS capsule Take 50,000 Units by mouth every Friday.     No current facility-administered medications for this encounter.   Vitals:   01/02/23 1158  BP: (!) 212/80  Pulse: 60  SpO2: 97%  Weight: 68.9 kg (152 lb)      Wt Readings from Last 3 Encounters:  01/02/23 68.9 kg (152 lb)  12/24/22 68.5 kg (151 lb)  12/14/22 68.3 kg (150 lb 9.6 oz)   PHYSICAL EXAM: General:  Well appearing. No resp difficulty HEENT: normal Neck: supple. no JVD. Carotids 2+ bilat; no bruits. No lymphadenopathy or thryomegaly appreciated. Cor: PMI nondisplaced. Regular rate & rhythm. No rubs, gallops or murmurs. Lungs: decreased throughout  Abdomen: soft, nontender, nondistended. No hepatosplenomegaly. No bruits or masses. Good bowel sounds. Extremities: no cyanosis, clubbing, rash, edema Diffuse RA changes with nodules  Neuro: alert & orientedx3, cranial nerves grossly intact. moves all 4 extremities w/o difficulty. Affect pleasant   ASSESSMENT & PLAN:  1. 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  - Echo (11/22): EF 55%, Mod-Severe MR, mild TR - TEE (11/22): EF 55%, RV okay, 2+ central MR,  - Echo 12/23:  EF read as 45-50%  (I felt  55-60%) mild-mod MR mild AI - Doing well NYHA II. Volume status stable - Continue spiro 25 mg daily. - Continue Toprol XL 50 mg daily. - Increase hydralazine 75 mg tid -> 100 tid + Imdur 60 mg daily. - Continue Jardiance  10  - Continue Entresto to 97/156m twice a day.   2. CAD  - R/LHC (2/22) 3v CAD with totally occluded RCA with L -> R collaterals, 80% lesion in mLCX, mild non-obs in LAD - No s/s angina - Continue aspirin 81 mg daily.   - Continue atorvastatin 40 mg daily.   3. 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 T2DM: -a1c 04/2021 7.0 -On tradjenta + Farxiga -Followed by PCP -Stop FWilder Gladeas above  5. CKD3a: - Baseline Scr~1.2-1.5 - Recent creatinine 01.1  6. Severe Sleep Apnea . - AHI 60/hr. Suspect severe OSA contributing to HTN  - unable to tolerate CPAP. Would she be candidate for Inspire device?  - Followed by Dr TRadford Pax Recommended she f/u to discuss options  7. HTN - BP markedly elevated. She reports full compliance with meds. I increased hydralazine. Will refer back to Dr. ROval Linsey(her primary cardiologist) for ongoing management of her HF and HTN. Would likely benefit from spiro.  - We gave clonidine 0.112min clinic today  8. Tobacco Use - Quit 12/23 - Congratulated her  DaGlori BickersMD  12:17 PM

## 2023-01-02 NOTE — Patient Instructions (Signed)
INCREASE hydralazine to 147m Three times a day  TAKE Clonidine today.  You have been referred back to Dr. ROval Linsey Her office will call you to arrange your appointment.  Your physician recommends that you schedule a follow-up appointment in: AS Needed.  If you have any questions or concerns before your next appointment please send [place] message through [place] call our office at [phone]    TO LEAVE A MESSAGE FOR THE NURSE SELECT OPTION 2, PLEASE LEAVE A MESSAGE INCLUDING: YOUR NAME DATE OF BIRTH CALL BACK NUMBER REASON FOR CALL**this is important as we prioritize the call backs  YOU WILL RECEIVE A CALL BACK THE SAME DAY AS LONG AS YOU CALL BEFORE 4:00 PM At the [facility] you and your health needs are our priority. As part of our continuing mission to provide you with exceptional heart care, we have created designated Provider Care Teams. These Care Teams include your primary Cardiologist (physician) and Advanced Practice Providers (APPs- Physician Assistants and Nurse Practitioners) who all work together to provide you with the care you need, when you need it.   You may see any of the following providers on your designated Care Team at your next follow up: Dr DGlori BickersDr DLoralie ChampagneDr. ARoxana Hires NP BLyda Jester PU[facility]ever[facility] PUtahAForestine Na NP LAudry Riles PharmD   Please be sure to bring in all your medications bottles to every appointment.    [facility] you for choosing [facility]

## 2023-01-07 ENCOUNTER — Ambulatory Visit (HOSPITAL_COMMUNITY)
Admission: RE | Admit: 2023-01-07 | Discharge: 2023-01-07 | Disposition: A | Discharge status: Home / Self Care | Payer: Medicare Other | Source: Ambulatory Visit | Attending: Medical | Admitting: Medical

## 2023-01-07 DIAGNOSIS — I10 Essential (primary) hypertension: Secondary | ICD-10-CM

## 2023-01-07 DIAGNOSIS — E785 Hyperlipidemia, unspecified: Secondary | ICD-10-CM | POA: Diagnosis not present

## 2023-01-07 DIAGNOSIS — I509 Heart failure, unspecified: Secondary | ICD-10-CM | POA: Diagnosis not present

## 2023-01-07 DIAGNOSIS — E1101 Type 2 diabetes mellitus with hyperosmolarity with coma: Secondary | ICD-10-CM | POA: Insufficient documentation

## 2023-01-07 DIAGNOSIS — R0989 Other specified symptoms and signs involving the circulatory and respiratory systems: Secondary | ICD-10-CM | POA: Diagnosis not present

## 2023-01-07 DIAGNOSIS — E1169 Type 2 diabetes mellitus with other specified complication: Secondary | ICD-10-CM | POA: Diagnosis not present

## 2023-01-07 DIAGNOSIS — Z794 Long term (current) use of insulin: Secondary | ICD-10-CM | POA: Insufficient documentation

## 2023-01-07 NOTE — Progress Notes (Signed)
Your blood flow studies do show some decreased blood flow. Typically when we see this type of result will refer to vascular surgery for further evaluation.  If agreeable we will place referral to vascular surgery for consult

## 2023-01-08 ENCOUNTER — Other Ambulatory Visit: Payer: Self-pay | Admitting: Internal Medicine

## 2023-01-08 DIAGNOSIS — R0989 Other specified symptoms and signs involving the circulatory and respiratory systems: Secondary | ICD-10-CM

## 2023-01-08 LAB — VAS US ABI WITH/WO TBI
Left ABI: 0.52
Right ABI: 0.55

## 2023-01-13 NOTE — Progress Notes (Unsigned)
[facility] [place]  ASSESSMENT / PLAN: Maria Foster is a 67 y.o. female with atherosclerosis of native arteries of bilateral lower extremities causing no symptoms.  Patient counseled patients with asymptomatic peripheral arterial disease or claudication have a 1-2% risk of developing chronic limb threatening ischemia, but a 15-30% risk of mortality in the next 5 years. Intervention should only be considered for medically optimized patients with disabling symptoms.   Recommend the following which can slow the progression of atherosclerosis and reduce the risk of major adverse cardiac / limb events:  Complete cessation from all tobacco products. Blood glucose control with goal A1c < 7%. Blood pressure control with goal blood pressure < 140/90 mmHg. Lipid reduction therapy with goal LDL-C <100 mg/dL (<70 if symptomatic from PAD).  Aspirin '81mg'$  PO QD.  Atorvastatin 40-'80mg'$  PO QD (or other "high intensity" statin therapy).  Patient counseled about symptoms of peripheral arterial disease.  She was instructed to return to care should any of these develop.  Otherwise I will see her in 1 year with repeat ankle-brachial index.   CHIEF COMPLAINT: Asymptomatic peripheral arterial disease  HISTORY OF PRESENT ILLNESS: Maria Foster is a 67 y.o. female referred to clinic for evaluation of peripheral arterial disease.  Patient is asymptomatic from a peripheral arterial disease standpoint.  She reports she got a annual physical exam, which was worrisome for possible peripheral arterial disease, prompting noninvasive testing referral.  The patient reports she can walk as much as she likes.  She does not have any symptoms of claudication.  She has no symptoms of ischemic rest pain.  She has no ulcers about her feet.  She is a former smoker.    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   OU progressive,  will medically need to have cataract surgery with lens will implantation so as to allow for complete clearance of the vitreous cavity in anticipation of likely vitrectomy in the future for progressive PDR despite previous PRP as delineated by the E DRS series of reports   Diabetes mellitus type 2 with complications ([place])    diagnosed probably 2012 per patient   Diabetic retinopathy ([place])    Hypertension    Rheumatoid arthritis ([place])     Past Surgical History:  Procedure Laterality Date   INCISION AND DRAINAGE ABSCESS N/A 07/11/2021   Procedure: INCISION AND DRAINAGE RIGHT ELBOW;  Surgeon: Paralee Cancel, MD;  Location: [facility];  Service: Orthopedics;  Laterality: N/A;   PRP Bilateral OD - 07/23/2017 OS - 08/06/2017   RIGHT/LEFT HEART CATH AND CORONARY ANGIOGRAPHY N/A 01/05/2021   Procedure: RIGHT/LEFT HEART CATH AND CORONARY ANGIOGRAPHY;  Surgeon: Jolaine Artist, MD;  Location: [place];  Service: Cardiovascular;  Laterality: N/A;   TEE WITHOUT CARDIOVERSION N/A 10/09/2021   Procedure: TRANSESOPHAGEAL ECHOCARDIOGRAM (TEE);  Surgeon: Jolaine Artist, MD;  Location[facility] ENDOSCOPY;  Service: Cardiovascular;  Laterality: N/A;    Family History  Problem Relation Age of Onset   Diabetes Mother    Hypertension Mother    Heart Problems Sister    Amblyopia Neg Hx    Blindness Neg Hx    Cataracts Neg Hx    Glaucoma Neg Hx    Macular degeneration Neg Hx    Retinal detachment Neg Hx    Strabismus Neg Hx    Retinitis pigmentosa Neg Hx     Social History  Socioeconomic History   Marital status: Married    Spouse name: Not on file   Number of children: Not on file   Years of education: Not on file   Highest education level: Not on file  Occupational History   Occupation: retired  Tobacco Use   Smoking status: Former    Packs/day: 0.50    Years: 15.00    Total pack years: 7.50    Types: Cigarettes    Quit date: 09/2022    Years since quitting: 0.3    Passive  exposure: Past   Smokeless tobacco: Never   Tobacco comments:    Quit 10/28/2022  Vaping Use   Vaping Use: Never used  Substance and Sexual Activity   Alcohol use: Not Currently   Drug use: No   Sexual activity: Not on file  Other Topics Concern   Not on file  Social History Narrative   Not on file   Social Determinants of Health   Financial Resource Strain: Low Risk  (12/04/2022)   Overall Financial Resource Strain (CARDIA)    Difficulty of Paying Living Expenses: Not hard at all  Food Insecurity: No Food Insecurity (12/04/2022)   Hunger Vital Sign    Worried About Running Out of Food in the Last Year: Never true    [place] in the Last Year: Never true  Transportation Needs: No Transportation Needs (12/04/2022)   PRAPARE - Hydrologist (Medical): No    Lack of Transportation (Non-Medical): No  Physical Activity: Sufficiently Active (12/04/2022)   Exercise Vital Sign    Days of Exercise per Week: 4 days    Minutes of Exercise per Session: 60 min  Stress: No Stress Concern Present (12/04/2022)   [place]    Feeling of Stress : Not at all  Social Connections: Not on file  Intimate Partner Violence: Not At Risk (10/30/2022)   Humiliation, Afraid, Rape, and Kick questionnaire    Fear of Current or Ex-Partner: No    Emotionally Abused: No    Physically Abused: No    Sexually Abused: No    Allergies  Allergen Reactions   Metformin And Related Other (See Comments)    Kidney injury, GI upset, diarrhea   Farxiga [Dapagliflozin] Other (See Comments)    Yeast infections, recurrent    Current Outpatient Medications  Medication Sig Dispense Refill   acetaminophen (TYLENOL) 325 MG tablet Take 1-2 tablets (325-650 mg total) by mouth every 6 (six) hours as needed for mild pain (pain score 1-3 or temp > 100.5). 20 tablet 0   aspirin EC 81 MG tablet Take 81 mg by mouth every Monday,  Wednesday, and Friday.     atorvastatin (LIPITOR) 40 MG tablet Take 1 tablet (40 mg total) by mouth daily. 90 tablet 3   empagliflozin (JARDIANCE) 10 MG TABS tablet Take 1 tablet (10 mg total) by mouth daily before breakfast. 30 tablet 2   ferrous gluconate (FERGON) 324 MG tablet TAKE 1 TABLET(324 MG) BY MOUTH TWICE DAILY WITH A MEAL 180 tablet 1   furosemide (LASIX) 40 MG tablet Take 1 tablet (40 mg total) by mouth daily. 90 tablet 3   hydrALAZINE (APRESOLINE) 100 MG tablet Take 1 tablet (100 mg total) by mouth 3 (three) times daily. 200 tablet 3   isosorbide mononitrate (IMDUR) 60 MG 24 hr tablet TAKE 1 TABLET(60 MG) BY MOUTH DAILY 30 tablet 11   latanoprost (XALATAN) 0.005 %  ophthalmic solution Place 1 drop into both eyes at bedtime.     leflunomide (ARAVA) 20 MG tablet Take 1 tablet (20 mg total) by mouth daily. 30 tablet 2   linagliptin (TRADJENTA) 5 MG TABS tablet Take 1 tablet (5 mg total) by mouth daily. 90 tablet 1   metoprolol succinate (TOPROL-XL) 50 MG 24 hr tablet TAKE 1 TABLET(50 MG) BY MOUTH DAILY WITH OR IMMEDIATELY FOLLOWING A MEAL 90 tablet 1   polyethylene glycol (MIRALAX / GLYCOLAX) 17 g packet Take 17 g by mouth daily as needed for mild constipation. 14 each 0   potassium chloride SA (KLOR-CON) 20 MEQ tablet Take 2 tablets (40 mEq total) by mouth 2 (two) times daily. 120 tablet 3   sacubitril-valsartan (ENTRESTO) 97-103 MG TAKE 1 TABLET BY MOUTH TWICE DAILY. NEED FOLLOW UP APPOINTMENT FOR ANYMORE REFILLS 60 tablet 0   spironolactone (ALDACTONE) 25 MG tablet TAKE 1 TABLET(25 MG) BY MOUTH DAILY 90 tablet 1   Vitamin D, Ergocalciferol, (DRISDOL) 1.25 MG (50000 UNIT) CAPS capsule Take 50,000 Units by mouth every Friday.     benzonatate (TESSALON) 100 MG capsule Take 1 capsule (100 mg total) by mouth 2 (two) times daily as needed for cough. (Patient not taking: Reported on 01/14/2023) 20 capsule 0   cloNIDine (CATAPRES) 0.1 MG tablet Take 1 tablet (0.1 mg total) by mouth once for 1  dose. 1 tablet 0   ondansetron (ZOFRAN) 4 MG tablet Take 1 tablet (4 mg total) by mouth every 8 (eight) hours as needed for nausea or vomiting. (Patient not taking: Reported on 01/14/2023) 20 tablet 0   No current facility-administered medications for this visit.    PHYSICAL EXAM Vitals:   01/14/23 1003 01/14/23 1007  BP: (!) 194/74 (!) 161/77  Pulse: 70 70  Resp: 16   Temp: (!) 97.4 F (36.3 C)   TempSrc: Temporal   SpO2: 99%   Weight: 151 lb (68.5 kg)   Height: '5\' 3"'$  (1.6 m)     Middle aged woman in no acute distress Regular rate and rhythm Unlabored breathing No palpable pedal pulses No ulcers about the feet    PERTINENT LABORATORY AND RADIOLOGIC DATA  Most recent CBC    Latest Ref Rng & Units 11/09/2022   12:15 PM 10/31/2022    7:45 AM 10/29/2022    3:00 PM  CBC  WBC 3.4 - 10.8 x10E3/uL 7.9  8.3  9.9   Hemoglobin 11.1 - 15.9 g/dL 12.7  12.4  14.6   Hematocrit 34.0 - 46.6 % 41.2  39.8  45.6   Platelets 150 - 450 x10E3/uL 204  160  172      Most recent CMP    Latest Ref Rng & Units 12/24/2022    3:20 PM 12/14/2022   11:58 AM 11/09/2022   12:15 PM  CMP  Glucose 65 - 99 mg/dL 174  128  221   BUN 7 - 25 mg/dL '24  22  31   '$ Creatinine 0.50 - 1.05 mg/dL 1.15  1.13  1.28   Sodium 135 - 146 mmol/L 137  137  138   Potassium 3.5 - 5.3 mmol/L 4.5  4.0  4.5   Chloride 98 - 110 mmol/L 105  102  99   CO2 20 - 32 mmol/L '25  21  25   '$ Calcium 8.6 - 10.4 mg/dL 9.3  9.2  9.7   Total Protein 6.1 - 8.1 g/dL 7.6     Total Bilirubin 0.2 - 1.2 mg/dL 0.5  AST 10 - 35 U/L 11     ALT 6 - 29 U/L 10       Renal function Estimated Creatinine Clearance: 44.7 mL/min (A) (by C-G formula based on SCr of 1.15 mg/dL (H)).  Hgb A1c MFr Bld (%)  Date Value  10/30/2022 7.2 (H)    LDL Chol Calc (NIH)  Date Value Ref Range Status  12/14/2022 61 0 - 99 mg/dL Final     +-------+-----------+-----------+------------+------------+  ABI/TBIToday's ABIToday's TBIPrevious  ABIPrevious TBI  +-------+-----------+-----------+------------+------------+  Right 0.55       0.29                                 +-------+-----------+-----------+------------+------------+  Left  0.52       0.25                                 +-------+-----------+-----------+------------+------------+    Yevonne Aline. Stanford Breed, MD FACS [facility] Phone Number: [phone] 01/14/2023 10:30 AM   Total time spent on preparing this encounter including chart review, data review, collecting history, examining the patient, coordinating care for this new patient, 60 minutes.  Portions of this report may have been transcribed using voice recognition software.  Every effort has been made to ensure accuracy; however, inadvertent computerized transcription errors may still be present.

## 2023-01-14 ENCOUNTER — Encounter: Payer: Self-pay | Admitting: Vascular Surgery

## 2023-01-14 ENCOUNTER — Ambulatory Visit (INDEPENDENT_AMBULATORY_CARE_PROVIDER_SITE_OTHER): Payer: Medicare Other | Admitting: Vascular Surgery

## 2023-01-14 VITALS — BP 161/77 | HR 70 | Temp 97.4°F | Resp 16 | Ht 63.0 in | Wt 151.0 lb

## 2023-01-14 DIAGNOSIS — I739 Peripheral vascular disease, unspecified: Secondary | ICD-10-CM | POA: Diagnosis not present

## 2023-01-22 ENCOUNTER — Other Ambulatory Visit: Payer: Self-pay | Admitting: Internal Medicine

## 2023-02-16 ENCOUNTER — Other Ambulatory Visit (HOSPITAL_COMMUNITY): Payer: Self-pay | Admitting: Internal Medicine

## 2023-02-16 DIAGNOSIS — I5040 Unspecified combined systolic (congestive) and diastolic (congestive) heart failure: Secondary | ICD-10-CM

## 2023-02-20 ENCOUNTER — Encounter (HOSPITAL_BASED_OUTPATIENT_CLINIC_OR_DEPARTMENT_OTHER): Payer: Self-pay | Admitting: Cardiovascular Disease

## 2023-02-20 ENCOUNTER — Ambulatory Visit (HOSPITAL_BASED_OUTPATIENT_CLINIC_OR_DEPARTMENT_OTHER): Payer: Medicare Other | Admitting: Cardiovascular Disease

## 2023-02-20 VITALS — BP 210/68 | HR 63 | Ht 63.0 in | Wt 156.9 lb

## 2023-02-20 DIAGNOSIS — I160 Hypertensive urgency: Secondary | ICD-10-CM | POA: Diagnosis not present

## 2023-02-20 DIAGNOSIS — I10 Essential (primary) hypertension: Secondary | ICD-10-CM | POA: Diagnosis not present

## 2023-02-20 DIAGNOSIS — I509 Heart failure, unspecified: Secondary | ICD-10-CM | POA: Diagnosis not present

## 2023-02-20 MED ORDER — CARVEDILOL 25 MG PO TABS: 25.0000 mg | ORAL_TABLET | Freq: Two times a day (BID) | ORAL | 3 refills | Status: DC

## 2023-02-20 MED ORDER — CLONIDINE HCL 0.1 MG PO TABS: 0.1000 mg | ORAL_TABLET | Freq: Two times a day (BID) | ORAL | 11 refills | Status: DC

## 2023-02-20 NOTE — Progress Notes (Signed)
Advanced Hypertension Clinic Initial Assessment:    Date:  02/20/2023   ID:  Maria Foster, DOB 06/06/1956, MRN [id number]  PCP:  Carlena Hurl, PA-C  Cardiologist:  Skeet Latch, MD  Nephrologist:  Referring MD: Jolaine Artist, MD   CC: Hypertension  History of Present Illness:    Maria Foster is a 67 y.o. female with a hx of diabetes, hypertension, CKD 3, CAD, chronic systolic diastolic heart failure, tobacco use, and rheumatoid arthritis here to establish care in the Advanced Hypertension Clinic.   She was admitted 11/2021 with new onset heart failure.  At the time she was also hypertensive and had AKI.  Echo revealed LVEF 35 to 40% with moderate LV dysfunction and global hypokinesis.  She had grade 2 diastolic dysfunction.  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  She had mild to moderate aortic regurgitation.  She was diuresed and underwent cardiac catheterization 12/2020 that revealed three-vessel CAD and a totally occluded RCA with left-to-right collaterals.  She an 80% mid left circumflex and nonobstructive disease in the LAD.  LVEF was 40%.  She was medically managed given her lack of angina.  She does not have a mixed ischemic and nonischemic cardiomyopathy due to hypertension.  Echo 04/2021 showed improvement in her LVEF to 55%.  She did regurgitation was severe in the setting of a systolic blood pressure of 190.  She was out of her spironolactone and hydralazine at that time.  Medications were refilled and Entresto was increased.  She was admitted 06/2021 with septic arthritis of the elbow.  Synovial fluid grew Staph aureus.  Blood cultures are negative.  Echo was stable 09/2021.  TEE at that time revealed LVEF 55% with mild rheumatic mitral valve disease and mild to moderate mitral regurgitation.    Maria Foster last saw Dr. Haroldine Laws 12/2022.  At that time her blood pressure was 212/80 on hydralazine, Imdur,  metoprolol, spironolactone, and Entresto.  She was noted to have severe sleep apnea and has been unable to tolerate CPAP.  Recommended considering the inspire device.  She reported compliance with her medications and hydralazine was increased.  She was referred to advanced hypertension clinic.  Maria Foster expresses concern about her elevated blood pressure, despite being on medication. She reports that her home blood pressure readings are often in the range of 140 or 150 mmHg, but perceives them to be higher when measured at the doctor's office. She successfully quit smoking one year ago.  Maria Foster engages in regular exercise and is actively involved in caring for her great-grandchild, which keeps her physically active. She denies experiencing chest pain, breathing difficulties, or swelling in her legs or feet.  Regarding her diet, Maria Foster avoids salt and consumes three to four cups of coffee daily. She does not regularly take supplements or herbs but uses Tylenol for pain management, no NSAIDs.   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   OU progressive, will medically need to have cataract surgery with lens will implantation so as to allow for complete clearance of the vitreous cavity in anticipation of likely vitrectomy in the future for progressive PDR despite previous PRP as delineated by the E DRS series of reports   Diabetes mellitus type 2 with complications    diagnosed probably 2012 per patient   Diabetic retinopathy    Hypertension  Rheumatoid arthritis     Past Surgical History:  Procedure Laterality Date   INCISION AND DRAINAGE ABSCESS N/A 07/11/2021   Procedure: INCISION AND DRAINAGE RIGHT ELBOW;  Surgeon: Paralee Cancel, MD;  Location: [facility];  Service: Orthopedics;  Laterality: N/A;   PRP Bilateral OD - 07/23/2017 OS - 08/06/2017   RIGHT/LEFT HEART CATH AND CORONARY ANGIOGRAPHY N/A 01/05/2021    Procedure: RIGHT/LEFT HEART CATH AND CORONARY ANGIOGRAPHY;  Surgeon: Jolaine Artist, MD;  Location: [place] [facility];  Service: Cardiovascular;  Laterality: N/A;   TEE WITHOUT CARDIOVERSION N/A 10/09/2021   Procedure: TRANSESOPHAGEAL ECHOCARDIOGRAM (TEE);  Surgeon: Jolaine Artist, MD;  Location: [facility];  Service: Cardiovascular;  Laterality: N/A;    Current Medications: Current Meds  Medication Sig   acetaminophen (TYLENOL) 325 MG tablet Take 1-2 tablets (325-650 mg total) by mouth every 6 (six) hours as needed for mild pain (pain score 1-3 or temp > 100.5).   aspirin EC 81 MG tablet Take 81 mg by mouth every Monday, Wednesday, and Friday.   atorvastatin (LIPITOR) 40 MG tablet TAKE 1 TABLET(40 MG) BY MOUTH DAILY   carvedilol (COREG) 25 MG tablet Take 1 tablet (25 mg total) by mouth 2 (two) times daily.   cloNIDine (CATAPRES) 0.1 MG tablet Take 1 tablet (0.1 mg total) by mouth 2 (two) times daily.   empagliflozin (JARDIANCE) 10 MG TABS tablet Take 1 tablet (10 mg total) by mouth daily before breakfast.   ferrous gluconate (FERGON) 324 MG tablet TAKE 1 TABLET(324 MG) BY MOUTH TWICE DAILY WITH A MEAL   furosemide (LASIX) 40 MG tablet Take 1 tablet (40 mg total) by mouth daily.   hydrALAZINE (APRESOLINE) 100 MG tablet Take 1 tablet (100 mg total) by mouth 3 (three) times daily.   isosorbide mononitrate (IMDUR) 60 MG 24 hr tablet TAKE 1 TABLET(60 MG) BY MOUTH DAILY   latanoprost (XALATAN) 0.005 % ophthalmic solution Place 1 drop into both eyes at bedtime.   leflunomide (ARAVA) 20 MG tablet Take 1 tablet (20 mg total) by mouth daily.   linagliptin (TRADJENTA) 5 MG TABS tablet Take 1 tablet (5 mg total) by mouth daily.   ondansetron (ZOFRAN) 4 MG tablet Take 1 tablet (4 mg total) by mouth every 8 (eight) hours as needed for nausea or vomiting.   polyethylene glycol (MIRALAX / GLYCOLAX) 17 g packet Take 17 g by mouth daily as needed for mild constipation.   sacubitril-valsartan  (ENTRESTO) 97-103 MG Take 1 tablet by mouth 2 (two) times daily.   spironolactone (ALDACTONE) 25 MG tablet TAKE 1 TABLET(25 MG) BY MOUTH DAILY   Vitamin D, Ergocalciferol, (DRISDOL) 1.25 MG (50000 UNIT) CAPS capsule Take 50,000 Units by mouth every Friday.   [DISCONTINUED] metoprolol succinate (TOPROL-XL) 50 MG 24 hr tablet TAKE 1 TABLET(50 MG) BY MOUTH DAILY WITH OR IMMEDIATELY FOLLOWING A MEAL     Allergies:   Metformin and related and [place] [dapagliflozin]   Social History   Socioeconomic History   Marital status: Married    Spouse name: Not on file   Number of children: Not on file   Years of education: Not on file   Highest education level: Not on file  Occupational History   Occupation: retired  Tobacco Use   Smoking status: Former    Packs/day: 0.50    Years: 15.00    Additional pack years: 0.00    Total pack years: 7.50    Types: Cigarettes    Quit date: 09/2022  Years since quitting: 0.4    Passive exposure: Past   Smokeless tobacco: Never   Tobacco comments:    Quit 10/28/2022  Vaping Use   Vaping Use: Never used  Substance and Sexual Activity   Alcohol use: Not Currently   Drug use: No   Sexual activity: Not on file  Other Topics Concern   Not on file  Social History Narrative   Not on file   Social Determinants of Health   Financial Resource Strain: Low Risk  (12/04/2022)   Overall Financial Resource Strain (CARDIA)    Difficulty of Paying Living Expenses: Not hard at all  Food Insecurity: No Food Insecurity (12/04/2022)   Hunger Vital Sign    Worried About Running Out of Food in the Last Year: Never true    Ran Out of Food in the Last Year: Never true  Transportation Needs: No Transportation Needs (12/04/2022)   PRAPARE - Hydrologist (Medical): No    Lack of Transportation (Non-Medical): No  Physical Activity: Sufficiently Active (12/04/2022)   Exercise Vital Sign    Days of Exercise per Week: 4 days    Minutes of  Exercise per Session: 60 min  Stress: No Stress Concern Present (12/04/2022)   [place]    Feeling of Stress : Not at all  Social Connections: Not on file     Family History: The patient's family history includes Diabetes in her mother; Heart Problems in her sister; Heart attack in her mother; Heart failure in her mother, sister, and sister; Hypertension in her daughter and mother. There is no history of Amblyopia, Blindness, Cataracts, Glaucoma, Macular degeneration, Retinal detachment, Strabismus, or Retinitis pigmentosa.  ROS:   Please see the history of present illness.     All other systems reviewed and are negative.  EKGs/Labs/Other Studies Reviewed:    EKG:  EKG is not ordered today.    Recent Labs: 10/31/2022: Magnesium 2.5 11/09/2022: BNP 348.7; Hemoglobin 12.7; Platelets 204 12/24/2022: ALT 10; BUN 24; Creat 1.15; Potassium 4.5; Sodium 137   Recent Lipid Panel    Component Value Date/Time   CHOL 125 12/14/2022 1158   TRIG 54 12/14/2022 1158   HDL 52 12/14/2022 1158   CHOLHDL 2.4 12/14/2022 1158   CHOLHDL 3.1 01/25/2021 1208   VLDL 10 01/25/2021 1208   LDLCALC 61 12/14/2022 1158    Physical Exam:   VS:  BP (!) 210/68   Pulse 63   Ht 5\' 3"  (1.6 m)   Wt 156 lb 14.4 oz (71.2 kg)   SpO2 98%   BMI 27.79 kg/m  , BMI Body mass index is 27.79 kg/m. GENERAL:  Well appearing HEENT: Pupils equal round and reactive, fundi not visualized, oral mucosa unremarkable NECK:  No jugular venous distention, waveform within normal limits, carotid upstroke brisk and symmetric, no bruits, no thyromegaly LUNGS:  Clear to auscultation bilaterally HEART:  RRR.  PMI not displaced or sustained,S1 and S2 within normal limits, no S3, no S4, no clicks, no rubs, no murmurs ABD:  Flat, positive bowel sounds normal in frequency in pitch, no bruits, no rebound, no guarding, no midline pulsatile mass, no hepatomegaly, no  splenomegaly EXT:  2 plus pulses throughout, no edema, no cyanosis no clubbing SKIN:  No rashes no nodules NEURO:  Cranial nerves II through XII grossly intact, motor grossly intact throughout PSYCH:  Cognitively intact, oriented to person place and time   ASSESSMENT/PLAN:    #  Resistant Hypertension: Patient reports elevated blood pressure readings both at home and during clinic visits.  Higher in the office than at home.   - Currently on hydralazine, Lasix, Imdur, metoprolol, spironolactone, and Entresto for blood pressure and heart failure management.  Will continue to avoid amloidpine for now due to reduced systolic function in the past.  However, if BP remains uncontrolled, we may need to restart it. - Plan:   - Discontinue metoprolol.   - Initiate Clonidine 0.1 mg twice a day, starting tonight.   - Switch to Carvedilol 25 mg twice a day, starting tomorrow.   - Instruct patient to monitor blood pressure twice a day and record readings.   - Consider renal denervation   - Check renin/aldosterone and renal artery Dopplers   - Reduce caffeine intake  # Chronic systolic and diastolic HF, recovered LVEF: She is euvolemic and doing well.  Avoiding amlodipine if possible as above.   Patient has a family history of heart failure in mother and sisters.  Continue current meds but switch metoprolol to carvedilol as above.  # Hyperlipidemia:  Continue atorvastatin.   Screening for Secondary Hypertension:     02/20/2023    3:11 PM  Causes  Drugs/Herbals Screened     - Comments Limits sodium intake.  Quit smoking 2023.  Drinks 3 cups of coffee daily.  No NSAIDS  Renovascular HTN Screened     - Comments Check renal Dopplers  Sleep Apnea Screened     - Comments No symptoms  Thyroid Disease Screened  Hyperaldosteronism Screened     - Comments Check renin and aldosterone  Pheochromocytoma N/A  Cushing's Syndrome N/A  Hyperparathyroidism Screened  Coarctation of the Aorta Screened     -  Comments BP symmetric  Compliance Screened    Relevant Labs/Studies:    Latest Ref Rng & Units 12/24/2022    3:20 PM 12/14/2022   11:58 AM 11/09/2022   12:15 PM  Basic Labs  Sodium 135 - 146 mmol/L 137  137  138   Potassium 3.5 - 5.3 mmol/L 4.5  4.0  4.5   Creatinine 0.50 - 1.05 mg/dL 1.15  1.13  1.28        Disposition:    FU with MD/PharmD in 1 month    Medication Adjustments/Labs and Tests Ordered: Current medicines are reviewed at length with the patient today.  Concerns regarding medicines are outlined above.  Orders Placed This Encounter  Procedures   Aldosterone + renin activity w/ ratio   VAS US RENAL ARTERY DUPLEX   Meds ordered this encounter  Medications   cloNIDine (CATAPRES) 0.1 MG tablet    Sig: Take 1 tablet (0.1 mg total) by mouth 2 (two) times daily.    Dispense:  60 tablet    Refill:  11   carvedilol (COREG) 25 MG tablet    Sig: Take 1 tablet (25 mg total) by mouth 2 (two) times daily.    Dispense:  180 tablet    Refill:  3    D/C METOPROLOL     Signed, Skeet Latch, MD  02/20/2023 3:48 PM    [place]

## 2023-02-20 NOTE — Patient Instructions (Signed)
Medication Instructions:  STOP METOPROLOL   START CARVEDILOL 25 MG TWICE A DAY STARTING TOMORROW  START CLONIDINE 0.1 MG TWICE DAY (12 HOURS APART) STARTING TONIGHT    Labwork: RENIN/ALDOSTERONE LEVELS SOON, TO BE DONE FIRST THING IN THE MORNING    Testing/Procedures: Your physician has requested that you have a renal artery duplex. During this test, an ultrasound is used to evaluate blood flow to the kidneys. Allow one hour for this exam. Do not eat after midnight the day before and avoid carbonated beverages. Take your medications as you usually do.   Follow-Up: PHARM D 1 MONTH    Special Instructions:   MONITOR YOUR BLOOD PRESSURE TWICE A DAY, LOG IN THE BOOK PROVIDED. BRING THE BOOK AND YOUR BLOOD PRESSURE MACHINE TO YOUR FOLLOW UP IN 1 MONTH   DASH Eating Plan DASH stands for "Dietary Approaches to Stop Hypertension." The DASH eating plan is a healthy eating plan that has been shown to reduce high blood pressure (hypertension). It may also reduce your risk for type 2 diabetes, heart disease, and stroke. The DASH eating plan may also help with weight loss. What are tips for following this plan?  General guidelines Avoid eating more than 2,300 mg (milligrams) of salt (sodium) a day. If you have hypertension, you may need to reduce your sodium intake to 1,500 mg a day. Limit alcohol intake to no more than 1 drink a day for nonpregnant women and 2 drinks a day for men. One drink equals 12 oz of beer, 5 oz of wine, or 1 oz of hard liquor. Work with your health care provider to maintain a healthy body weight or to lose weight. Ask what an ideal weight is for you. Get at least 30 minutes of exercise that causes your heart to beat faster (aerobic exercise) most days of the week. Activities may include walking, swimming, or biking. Work with your health care provider or diet and nutrition specialist (dietitian) to adjust your eating plan to your individual calorie needs. Reading food  labels  Check food labels for the amount of sodium per serving. Choose foods with less than 5 percent of the Daily Value of sodium. Generally, foods with less than 300 mg of sodium per serving fit into this eating plan. To find whole grains, look for the word "whole" as the first word in the ingredient list. Shopping Buy products labeled as "low-sodium" or "no salt added." Buy fresh foods. Avoid canned foods and premade or frozen meals. Cooking Avoid adding salt when cooking. Use salt-free seasonings or herbs instead of table salt or sea salt. Check with your health care provider or pharmacist before using salt substitutes. Do not fry foods. Cook foods using healthy methods such as baking, boiling, grilling, and broiling instead. Cook with heart-healthy oils, such as olive, canola, soybean, or sunflower oil. Meal planning Eat a balanced diet that includes: 5 or more servings of fruits and vegetables each day. At each meal, try to fill half of your plate with fruits and vegetables. Up to 6-8 servings of whole grains each day. Less than 6 oz of lean meat, poultry, or fish each day. A 3-oz serving of meat is about the same size as a deck of cards. One egg equals 1 oz. 2 servings of low-fat dairy each day. A serving of nuts, seeds, or beans 5 times each week. Heart-healthy fats. Healthy fats called Omega-3 fatty acids are found in foods such as flaxseeds and coldwater fish, like sardines, salmon, and mackerel.  Limit how much you eat of the following: Canned or prepackaged foods. Food that is high in trans fat, such as fried foods. Food that is high in saturated fat, such as fatty meat. Sweets, desserts, sugary drinks, and other foods with added sugar. Full-fat dairy products. Do not salt foods before eating. Try to eat at least 2 vegetarian meals each week. Eat more home-cooked food and less restaurant, buffet, and fast food. When eating at a restaurant, ask that your food be prepared with  less salt or no salt, if possible. What foods are recommended? The items listed may not be a complete list. Talk with your dietitian about what dietary choices are best for you. Grains Whole-grain or whole-wheat bread. Whole-grain or whole-wheat pasta. Brown rice. [place]. Bulgur. Whole-grain and low-sodium cereals. Pita bread. Low-fat, low-sodium crackers. Whole-wheat flour tortillas. Vegetables Fresh or frozen vegetables (raw, steamed, roasted, or grilled). Low-sodium or reduced-sodium tomato and vegetable juice. Low-sodium or reduced-sodium tomato sauce and tomato paste. Low-sodium or reduced-sodium canned vegetables. Fruits All fresh, dried, or frozen fruit. Canned fruit in natural juice (without added sugar). Meat and other protein foods Skinless chicken or [place]. Ground chicken or [place]. Pork with fat trimmed off. Fish and seafood. Egg whites. Dried beans, peas, or lentils. Unsalted nuts, nut butters, and seeds. Unsalted canned beans. Lean cuts of beef with fat trimmed off. Low-sodium, lean deli meat. Dairy Low-fat (1%) or fat-free (skim) milk. Fat-free, low-fat, or reduced-fat cheeses. Nonfat, low-sodium ricotta or cottage cheese. Low-fat or nonfat yogurt. Low-fat, low-sodium cheese. Fats and oils Soft margarine without trans fats. Vegetable oil. Low-fat, reduced-fat, or light mayonnaise and salad dressings (reduced-sodium). Canola, safflower, olive, soybean, and sunflower oils. Avocado. Seasoning and other foods Herbs. Spices. Seasoning mixes without salt. Unsalted popcorn and pretzels. Fat-free sweets. What foods are not recommended? The items listed may not be a complete list. Talk with your dietitian about what dietary choices are best for you. Grains Baked goods made with fat, such as croissants, muffins, or some breads. Dry pasta or rice meal packs. Vegetables Creamed or fried vegetables. Vegetables in a cheese sauce. Regular canned vegetables (not low-sodium or  reduced-sodium). Regular canned tomato sauce and paste (not low-sodium or reduced-sodium). Regular tomato and vegetable juice (not low-sodium or reduced-sodium). Angie Fava. Olives. Fruits Canned fruit in a light or heavy syrup. Fried fruit. Fruit in cream or butter sauce. Meat and other protein foods Fatty cuts of meat. Ribs. Fried meat. Berniece Salines. Sausage. Bologna and other processed lunch meats. Salami. Fatback. Hotdogs. Bratwurst. Salted nuts and seeds. Canned beans with added salt. Canned or smoked fish. Whole eggs or egg yolks. Chicken or [place] with skin. Dairy Whole or 2% milk, cream, and half-and-half. Whole or full-fat cream cheese. Whole-fat or sweetened yogurt. Full-fat cheese. Nondairy creamers. Whipped toppings. Processed cheese and cheese spreads. Fats and oils Butter. Stick margarine. Lard. Shortening. Ghee. Bacon fat. Tropical oils, such as coconut, palm kernel, or palm oil. Seasoning and other foods Salted popcorn and pretzels. Onion salt, garlic salt, seasoned salt, table salt, and sea salt. Worcestershire sauce. Tartar sauce. Barbecue sauce. Teriyaki sauce. Soy sauce, including reduced-sodium. Steak sauce. Canned and packaged gravies. Fish sauce. Oyster sauce. Cocktail sauce. Horseradish that you find on the shelf. Ketchup. Mustard. Meat flavorings and tenderizers. Bouillon cubes. Hot sauce and Tabasco sauce. Premade or packaged marinades. Premade or packaged taco seasonings. Relishes. Regular salad dressings. Where to find more information: National Heart, Lung, and [place]: https://wilson-eaton.com/ American Heart Association: www.heart.org Summary The DASH eating  plan is a healthy eating plan that has been shown to reduce high blood pressure (hypertension). It may also reduce your risk for type 2 diabetes, heart disease, and stroke. With the DASH eating plan, you should limit salt (sodium) intake to 2,300 mg a day. If you have hypertension, you may need to reduce your sodium intake to  1,500 mg a day. When on the DASH eating plan, aim to eat more fresh fruits and vegetables, whole grains, lean proteins, low-fat dairy, and heart-healthy fats. Work with your health care provider or diet and nutrition specialist (dietitian) to adjust your eating plan to your individual calorie needs. This information is not intended to replace advice given to you by your health care provider. Make sure you discuss any questions you have with your health care provider. Document Released: 10/25/2011 Document Revised: 10/18/2017 Document Reviewed: 10/29/2016 Elsevier Patient Education  2020 [employer].

## 2023-02-27 ENCOUNTER — Other Ambulatory Visit: Payer: Self-pay | Admitting: Medical

## 2023-02-27 ENCOUNTER — Other Ambulatory Visit: Payer: Self-pay | Admitting: Internal Medicine

## 2023-02-27 DIAGNOSIS — M05741 Rheumatoid arthritis with rheumatoid factor of right hand without organ or systems involvement: Secondary | ICD-10-CM

## 2023-02-27 NOTE — Telephone Encounter (Signed)
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 daily   Contacted patient to advise one of her labs are due. Patient states she plans to get labs done on Monday.   Okay to refill Arava ?

## 2023-02-28 ENCOUNTER — Other Ambulatory Visit: Payer: Self-pay | Admitting: Internal Medicine

## 2023-02-28 ENCOUNTER — Other Ambulatory Visit: Payer: Self-pay | Admitting: *Deleted

## 2023-02-28 DIAGNOSIS — Z79899 Other long term (current) drug therapy: Secondary | ICD-10-CM

## 2023-02-28 DIAGNOSIS — M05741 Rheumatoid arthritis with rheumatoid factor of right hand without organ or systems involvement: Secondary | ICD-10-CM

## 2023-03-01 LAB — CBC WITH DIFFERENTIAL/PLATELET
Absolute Monocytes: 483 cells/uL (ref 200–950)
Basophils Absolute: 88 cells/uL (ref 0–200)
Basophils Relative: 1.3 %
Eosinophils Absolute: 143 cells/uL (ref 15–500)
Eosinophils Relative: 2.1 %
HCT: 42.4 % (ref 35.0–45.0)
Hemoglobin: 13.8 g/dL (ref 11.7–15.5)
Lymphs Abs: 1367 cells/uL (ref 850–3900)
MCH: 29.4 pg (ref 27.0–33.0)
MCHC: 32.5 g/dL (ref 32.0–36.0)
MCV: 90.2 fL (ref 80.0–100.0)
MPV: 11.6 fL (ref 7.5–12.5)
Monocytes Relative: 7.1 %
Neutro Abs: 4719 cells/uL (ref 1500–7800)
Neutrophils Relative %: 69.4 %
Platelets: 181 10*3/uL (ref 140–400)
RBC: 4.7 10*6/uL (ref 3.80–5.10)
RDW: 12.5 % (ref 11.0–15.0)
Total Lymphocyte: 20.1 %
WBC: 6.8 10*3/uL (ref 3.8–10.8)

## 2023-03-01 LAB — COMPLETE METABOLIC PANEL WITH GFR
AG Ratio: 1.1 (calc) (ref 1.0–2.5)
ALT: 12 U/L (ref 6–29)
AST: 15 U/L (ref 10–35)
Albumin: 4.2 g/dL (ref 3.6–5.1)
Alkaline phosphatase (APISO): 118 U/L (ref 37–153)
BUN/Creatinine Ratio: 21 (calc) (ref 6–22)
BUN: 24 mg/dL (ref 7–25)
CO2: 25 mmol/L (ref 20–32)
Calcium: 9.7 mg/dL (ref 8.6–10.4)
Chloride: 105 mmol/L (ref 98–110)
Creat: 1.17 mg/dL — ABNORMAL HIGH (ref 0.50–1.05)
Globulin: 3.7 g/dL (calc) (ref 1.9–3.7)
Glucose, Bld: 151 mg/dL — ABNORMAL HIGH (ref 65–99)
Potassium: 4.3 mmol/L (ref 3.5–5.3)
Sodium: 139 mmol/L (ref 135–146)
Total Bilirubin: 0.5 mg/dL (ref 0.2–1.2)
Total Protein: 7.9 g/dL (ref 6.1–8.1)
eGFR: 51 mL/min/{1.73_m2} — ABNORMAL LOW (ref >=60)

## 2023-03-18 ENCOUNTER — Other Ambulatory Visit: Payer: Self-pay | Admitting: Medical

## 2023-03-18 ENCOUNTER — Ambulatory Visit (HOSPITAL_COMMUNITY)
Admission: RE | Admit: 2023-03-18 | Discharge: 2023-03-18 | Disposition: A | Discharge status: Home / Self Care | Payer: Medicare Other | Source: Ambulatory Visit | Attending: Cardiology | Admitting: Cardiology

## 2023-03-18 DIAGNOSIS — I10 Essential (primary) hypertension: Secondary | ICD-10-CM | POA: Diagnosis not present

## 2023-03-18 NOTE — Telephone Encounter (Signed)
Pt was increased

## 2023-03-24 NOTE — Progress Notes (Deleted)
Office Visit Note  Patient: Maria Foster             Date of Birth: March 18, 1956           MRN: [id number]             PCP: Jac Canavan, PA-C Referring: Jac Canavan, PA-C Visit Date: 03/25/2023   Subjective:  No chief complaint on file.   History of Present Illness: Maria Foster is a 67 y.o. female here for follow up ***   Previous HPI 12/24/22 Maria Foster is a 67 y.o. 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 daily.  Unfortunately had interruption with starting due to hospital admission for systemic inflammation starting from urinary tract infection.  Joint pain symptoms have not changed significantly.  She is having issue with ongoing nonproductive cough.  Also with frequent eye watering and some irritation no vision changes and no associated swelling.  She has upcoming evaluations for her lungs and congestive heart failure.     Previous HPI 09/26/22 Maria Foster is a 67 y.o. female here for follow up for seropositive RA we did not start methotrexate as planned due to renal impairment worse than baseline checked at clinic appointment.  Symptoms remain about the same and also not seeing any worsening with stopping hydroxychloroquine.   Previous HPI 07/02/2022 Maria Foster is a 67 y.o. 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  She feels like symptoms have made no significant difference on this treatment.  No recurrence of severe flareups like she had in her elbow either.  She does notice a few new and expanding nodules with episodes of spontaneous rupturing and white fluid drainage. Does not have much joint pain or swelling outside of the nodules.   Previous HPI 01/01/2022 Maria Foster is a 67 y.o. female here for follow up for seropositive RA on HCQ 200 mg daily. After our last visit with right elbow injection she developed worsening pain and then purulent drainage found to be MSSA  infection of that right olecranon bursa and went to the hospital for surgical debridement of the wound. Wound culture was obtained also surgical report of rheumatoid nodule at the site. She took a course of antibiotics with PICC line completed without incident. She resumed hydroxychloroquine after this and she feels her joint pain is very well controlled at this time. She notices some increase in stiffness and pain with cold and changing weather.   Previous HPI 07/03/21 Maria Foster is a 67 y.o. female here for follow up for seropositive RA and chronic tophaceous gout. Recommended starting hydroxychloroquine treatment 200 mg PO daily for suspected RA as active disease and normal uric levels. She feels her symptoms are improved since starting this, particularly states pain with weather and temperature changes are decreased. She has right elbow pain more than elsewhere says this hurts mostly due to pressure on the site when lying in bed or certain seated positions.   Previous HPI: 03/29/21 Maria Foster is a 67 y.o. female referred here for evaluation of rheumatoid arthritis.  She has a longstanding history of bilateral hand and knee pain that has been going on for years with some progressive worsening and deformity over time.  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  She also has chronic knee pain and stiffness bilaterally without any large nodular changes.  She does not have much joint pain most days however experiences some episodically and also feels that her hands and knees hurt worse with cold and rainy weather.  She describes a history of rheumatoid arthritis but has never been on treatment plans for this.  She states she has never had any episode of gout involving the toes or feet but has been told by her  cardiologist this is a problem that she has.   She does not recall ever undergoing bone density screening for osteoporosis. She has a long smoking history but in recent months is either not smoking or very reduced usage most days.   No Rheumatology ROS completed.   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 type 2 diabetes mellitus (HCC) 11/16/2021   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 2 diabetes mellitus (HCC) 11/16/2021   OSA (obstructive sleep apnea) 08/25/2021   Iron deficiency 08/25/2021   Screening for cancer 08/25/2021   Influenza vaccination declined 08/25/2021   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   Stage 3a chronic kidney disease (HCC) 07/11/2021   Septic olecranon bursitis of right elbow 07/11/2021   Essential hypertension, benign 05/04/2021   Hyperlipidemia associated with type 2 diabetes mellitus (HCC) 05/04/2021   Enlarged and hypertrophic nails 05/04/2021   Encounter for screening mammogram for malignant neoplasm of breast 05/04/2021   Need for pneumococcal vaccination 05/04/2021   CHF (congestive heart failure) (HCC) 05/04/2021   Stable angina 05/04/2021   Vaccine counseling 05/04/2021   Tobacco use 05/04/2021   High risk medication use 03/29/2021   Screen for colon cancer 01/27/2021   Tophi 01/11/2021   Rheumatoid arthritis involving both hands (HCC) 01/11/2021   Elevated troponin    Type 2 diabetes mellitus (HCC) 12/19/2020   Constipation 12/19/2020    Past Medical History:  Diagnosis Date   Arthritis    Cataract    Cataract,  nuclear sclerotic, both eyes 11/16/2021   OU progressive, will medically need to have cataract surgery with lens will implantation so as to allow for complete clearance of the vitreous cavity in anticipation of likely vitrectomy in the future for progressive PDR despite previous PRP as delineated by the E DRS series of reports   Diabetes mellitus type 2 with complications (HCC)    diagnosed probably 2012 per patient   Diabetic retinopathy (HCC)    Hypertension    Rheumatoid arthritis (HCC)     Family History  Problem Relation Age of Onset   Heart attack Mother    Heart failure Mother    Diabetes Mother    Hypertension Mother    Heart failure Sister    Heart Problems Sister    Heart failure Sister    Hypertension Daughter    Amblyopia Neg Hx    Blindness Neg Hx    Cataracts Neg Hx    Glaucoma Neg Hx    Macular degeneration Neg Hx    Retinal detachment Neg Hx    Strabismus Neg  Hx    Retinitis pigmentosa Neg Hx    Past Surgical History:  Procedure Laterality Date   INCISION AND DRAINAGE ABSCESS N/A 07/11/2021   Procedure: INCISION AND DRAINAGE RIGHT ELBOW;  Surgeon: Durene Romans, MD;  Location: [facility];  Service: Orthopedics;  Laterality: N/A;   PRP Bilateral OD - 07/23/2017 OS - 08/06/2017   RIGHT/LEFT HEART CATH AND CORONARY ANGIOGRAPHY N/A 01/05/2021   Procedure: RIGHT/LEFT HEART CATH AND CORONARY ANGIOGRAPHY;  Surgeon: Dolores Patty, MD;  Location: [facility];  Service: Cardiovascular;  Laterality: N/A;   TEE WITHOUT CARDIOVERSION N/A 10/09/2021   Procedure: TRANSESOPHAGEAL ECHOCARDIOGRAM (TEE);  Surgeon: Dolores Patty, MD;  Location: [facility] ENDOSCOPY;  Service: Cardiovascular;  Laterality: N/A;   Social History   Social History Narrative   Not on file   Immunization History  Administered Date(s) Administered   COVID-19, mRNA, vaccine(Comirnaty)12 years and older 12/14/2022   PFIZER(Purple Top)SARS-COV-2 Vaccination 02/09/2020, 03/01/2020, 11/05/2020    Pneumococcal Polysaccharide-23 05/04/2021   Tdap 07/11/2021     Objective: Vital Signs: There were no vitals taken for this visit.   Physical Exam   Musculoskeletal Exam: ***  CDAI Exam: CDAI Score: -- Patient Global: --; Provider Global: -- Swollen: --; Tender: -- Joint Exam 03/25/2023   No joint exam has been documented for this visit   There is currently no information documented on the homunculus. Go to the Rheumatology activity and complete the homunculus joint exam.  Investigation: No additional findings.  Imaging: VAS US RENAL ARTERY DUPLEX  Result Date: 03/18/2023 ABDOMINAL VISCERAL Patient Name:  JOMARA VOLKOV  Date of Exam:   03/18/2023 Medical Rec #: [id number]        Accession #:    [id number] Date of Birth: Aug 27, 1956        Patient Gender: F Patient Age:   32 years Exam Location:  [facility] Procedure:      VAS US RENAL ARTERY DUPLEX Referring Phys: [id number] TIFFANY [place] -------------------------------------------------------------------------------- Indications: Resistant hypertension. Patient reports elevated blood pressure              readings both at home and during clinic visits. She is currently on              hydralazine, Lasix, Imdur, metoprolol, spironolactone and Entresto              for blood pressure and heart failure management. High Risk Factors: Hypertension, hyperlipidemia, Diabetes, past history of                    smoking. Limitations: Air/bowel gas and obesity. Performing Technologist: Olegario Shearer RVT  Examination Guidelines: A complete evaluation includes B-mode imaging, spectral Doppler, color Doppler, and power Doppler as needed of all accessible portions of each vessel. Bilateral testing is considered an integral part of a complete examination. Limited examinations for reoccurring indications may be performed as noted.  Duplex Findings: +--------------------+--------+--------+------+--------+ Mesenteric          PSV cm/sEDV  cm/sPlaqueComments +--------------------+--------+--------+------+--------+ Aorta Prox            104      0                   +--------------------+--------+--------+------+--------+ Aorta Distal           71      0                   +--------------------+--------+--------+------+--------+ Celiac Artery Origin  123                          +--------------------+--------+--------+------+--------+  SMA Origin            175      1                   +--------------------+--------+--------+------+--------+    +------------------+--------+--------+-------+ Right Renal ArteryPSV cm/sEDV cm/sComment +------------------+--------+--------+-------+ Origin               73      14           +------------------+--------+--------+-------+ Proximal             41      8            +------------------+--------+--------+-------+ Mid                  62      9            +------------------+--------+--------+-------+ Distal               34      7            +------------------+--------+--------+-------+ +-----------------+--------+--------+----------------------------+ Left Renal ArteryPSV cm/sEDV cm/s          Comment            +-----------------+--------+--------+----------------------------+ Origin              74      13                                +-----------------+--------+--------+----------------------------+ Proximal           123      18                                +-----------------+--------+--------+----------------------------+ Mid                146      30                                +-----------------+--------+--------+----------------------------+ Distal             353      27   elevated velocities distally +-----------------+--------+--------+----------------------------+  Technologist observations: IVC prox and bilateral renal veins appear patent. Elevated velocities noted at the distal renal artery/hilar segment of the left  renal artery. +------------+--------+--------+----+-----------+--------+--------+----+ Right KidneyPSV cm/sEDV cm/sRI  Left KidneyPSV cm/sEDV cm/sRI   +------------+--------+--------+----+-----------+--------+--------+----+ Upper Pole  20      7       0.67Upper Pole 14      7       0.54 +------------+--------+--------+----+-----------+--------+--------+----+ Mid         24      7       0.        17      5       0.70 +------------+--------+--------+----+-----------+--------+--------+----+ Lower Pole  35      7       0.80Lower Pole 20      8       0.62 +------------+--------+--------+----+-----------+--------+--------+----+ Hilar       63      11      0.83Hilar      259     34      0.87 +------------+--------+--------+----+-----------+--------+--------+----+ +------------------+-------+------------------+-------+ Right Kidney  Left Kidney               +------------------+-------+------------------+-------+ RAR                      RAR                       +------------------+-------+------------------+-------+ RAR (manual)      0.7    RAR (manual)              +------------------+-------+------------------+-------+ Cortex                   Cortex                    +------------------+-------+------------------+-------+ Cortex thickness  6.00 mmCorex thickness   9.00 mm +------------------+-------+------------------+-------+ Kidney length (cm)10.36  Kidney length (cm)9.88    +------------------+-------+------------------+-------+  Summary: Renal:  Right: No evidence of right renal artery stenosis. RRV flow present.        Normal size right kidney. Abnormal right Resistive Index.        Normal cortical thickness of right kidney. Left:  High-end 1-59% stenosis of the distal left renal artery. LRV        flow present. Normal size of left kidney. Normal left        Resistive Index. Normal cortical thickness of the left         kidney. Mesenteric: Normal Celiac artery and Superior Mesenteric artery findings. Areas of limited visceral study include left parenchymal flow and left kidney size.  *See table(s) above for measurements and observations.  Diagnosing physician: Charlton Haws MD  Electronically signed by Charlton Haws MD on 03/18/2023 at 11:44:02 AM.    Final     Recent Labs: Lab Results  Component Value Date   WBC 6.8 02/28/2023   HGB 13.8 02/28/2023   PLT 181 02/28/2023   NA 139 02/28/2023   K 4.3 02/28/2023   CL 105 02/28/2023   CO2 25 02/28/2023   GLUCOSE 151 (H) 02/28/2023   BUN 24 02/28/2023   CREATININE 1.17 (H) 02/28/2023   BILITOT 0.5 02/28/2023   ALKPHOS 85 10/30/2022   AST 15 02/28/2023   ALT 12 02/28/2023   PROT 7.9 02/28/2023   ALBUMIN 3.4 (L) 10/30/2022   CALCIUM 9.7 02/28/2023   GFRAA >60 11/29/2016    Speciality Comments: No specialty comments available.  Procedures:  No procedures performed Allergies: Metformin and related and Farxiga [dapagliflozin]   Assessment / Plan:     Visit Diagnoses: No diagnosis found.  ***  Orders: No orders of the defined types were placed in this encounter.  No orders of the defined types were placed in this encounter.    Follow-Up Instructions: No follow-ups on file.   Fuller Plan, MD  Note - This record has been created using [employer].  Chart creation errors have been sought, but may not always  have been located. Such creation errors do not reflect on  the standard of medical care.

## 2023-03-25 ENCOUNTER — Ambulatory Visit: Payer: Medicare Other | Admitting: Internal Medicine

## 2023-03-26 ENCOUNTER — Telehealth: Payer: Self-pay | Admitting: Medical

## 2023-03-26 NOTE — Telephone Encounter (Signed)
Please call patient.  I got a notice from [place] [facility] of 2 different beta-blockers being refilled in April both metoprolol and carvedilol.  According to the chart regularly she should only be on carvedilol per cardiology, but the insurance says metoprolol was just refilled on 03/18/23.  So she should not be on 2 different beta-blockers.  So stop metoprolol if she is taking carvedilol per cardiology

## 2023-03-26 NOTE — Telephone Encounter (Signed)
Spoke with patient and she is NOT taking metoprolol, only the carvedilol. She had an issue with her pharmacy refilling things for 90 days when she did not ask, she thinks this is what happened-but she is fully aware not to take.

## 2023-04-01 ENCOUNTER — Ambulatory Visit: Payer: Medicare Other | Attending: Internal Medicine | Admitting: Student

## 2023-04-01 ENCOUNTER — Encounter: Payer: Self-pay | Admitting: Student

## 2023-04-01 ENCOUNTER — Other Ambulatory Visit: Payer: Self-pay | Admitting: Medical

## 2023-04-01 VITALS — BP 200/68

## 2023-04-01 DIAGNOSIS — I10 Essential (primary) hypertension: Secondary | ICD-10-CM

## 2023-04-01 MED ORDER — CLONIDINE HCL 0.2 MG PO TABS: 0.2000 mg | ORAL_TABLET | Freq: Two times a day (BID) | ORAL | 3 refills | Status: DC

## 2023-04-01 NOTE — Assessment & Plan Note (Signed)
Assessment: BP is uncontrolled in office 1st BP 194/68  2nd measurement 200/68  Patient reports she has white coat syndrome  At home her BP ~158/73-74  heart rate 68 on validated BP cuff ( validated at PCP office) Tolerates current medications well without any side effects Denies SOB, palpitation, chest pain, headaches,or swelling Reiterated the importance of regular exercise and low salt diet   Plan:  Given elevated home BP on validated cuff appropriate to up clonidine dose from 0.1 mg twice daily to 0.2 mg twice daily   Continue taking carvedilol 25 mg twice daily,Imdur 60 mg daily, hydralazine 100 mg three times daily Lasix 40 mg daily, Entresto 97-103 mg twice daily, Spironolactone 25 mg daily  Patient to keep record of BP readings with heart rate and report to [place] at the next visit Patient to see PharmD in 4 weeks for follow up  Follow up lab(s) : none

## 2023-04-01 NOTE — Progress Notes (Signed)
Patient ID: DEANDA BARSON                 DOB: [birth year]                      MRN: [id number]      HPI: KEILLY GIDDINGS is a 67 y.o. female referred by Dr. Duke Salvia to HTN clinic. PMH is significant for RA, DM-2,HTN CKD stage 3, Chronic diastolic HF At last OV with [place] clonidine 0.1 mg twice daily, was added to the other antihypertensives and metoprolol was switched to carvedilol.  Patient presented today for BP follow up. Reports she tolerates medication changes well that Dr. Duke Salvia made. Her home BP ~ 158/73-74  heart rate 68, with highest reading 168/90 one time, recalls from memory, forgot to bring BP log. Takes all medications regularly; uses pill box to tack compliance. Eats low salt diet and exercise regularly  Current HTN meds: carvedilol 25 mg twice daily, Clonidine 0.1 mg twice daily, Imdur 60 mg daily, hydralazine 100 mg three times daily Lasix 40 mg daily, Entresto 97-103 mg twice daily, Spironolactone 25 mg daily  Previously tried: Amlodipine -Chronic systolic and diastolic HF, recovered LVEF  BP goal: <130/80  Family History:  Mother: heart problem, massive heart attack at age of 69 Sisters and brother : heart problem   Social History:  Smoking: quit 6 month ago  Alcohol: none   Diet: low salt intake- Washes canned food thoroughly  Once month eats out   Exercise: walking - 45-60 min every day, stretches 15 min day everyday     Home BP readings: 158/73-74  heart rate 68  Wt Readings from Last 3 Encounters:  02/20/23 156 lb 14.4 oz (71.2 kg)  01/14/23 151 lb (68.5 kg)  01/02/23 152 lb (68.9 kg)   BP Readings from Last 3 Encounters:  04/01/23 (!) 200/68  02/20/23 (!) 210/68  01/14/23 (!) 161/77   Pulse Readings from Last 3 Encounters:  02/20/23 63  01/14/23 70  01/02/23 60    Renal function: CrCl cannot be calculated (Patient's most recent lab result is older than the maximum 21 days allowed.).  Past Medical History:  Diagnosis Date    Arthritis    Cataract    Cataract, nuclear sclerotic, both eyes 11/16/2021   OU progressive, will medically need to have cataract surgery with lens will implantation so as to allow for complete clearance of the vitreous cavity in anticipation of likely vitrectomy in the future for progressive PDR despite previous PRP as delineated by the E DRS series of reports   Diabetes mellitus type 2 with complications (HCC)    diagnosed probably 2012 per patient   Diabetic retinopathy (HCC)    Hypertension    Rheumatoid arthritis (HCC)     Current Outpatient Medications on File Prior to Visit  Medication Sig Dispense Refill   acetaminophen (TYLENOL) 325 MG tablet Take 1-2 tablets (325-650 mg total) by mouth every 6 (six) hours as needed for mild pain (pain score 1-3 or temp > 100.5). 20 tablet 0   aspirin EC 81 MG tablet Take 81 mg by mouth every Monday, Wednesday, and Friday.     atorvastatin (LIPITOR) 40 MG tablet TAKE 1 TABLET(40 MG) BY MOUTH DAILY 90 tablet 3   carvedilol (COREG) 25 MG tablet Take 1 tablet (25 mg total) by mouth 2 (two) times daily. 180 tablet 3   ferrous gluconate (FERGON) 324 MG tablet TAKE 1 TABLET(324 MG) BY MOUTH TWICE  DAILY WITH A MEAL 180 tablet 1   furosemide (LASIX) 40 MG tablet Take 1 tablet (40 mg total) by mouth daily. 90 tablet 3   hydrALAZINE (APRESOLINE) 100 MG tablet Take 1 tablet (100 mg total) by mouth 3 (three) times daily. 200 tablet 3   isosorbide mononitrate (IMDUR) 60 MG 24 hr tablet TAKE 1 TABLET(60 MG) BY MOUTH DAILY 30 tablet 11   latanoprost (XALATAN) 0.005 % ophthalmic solution Place 1 drop into both eyes at bedtime.     leflunomide (ARAVA) 20 MG tablet TAKE 1 TABLET(20 MG) BY MOUTH DAILY 30 tablet 0   linagliptin (TRADJENTA) 5 MG TABS tablet Take 1 tablet (5 mg total) by mouth daily. 90 tablet 1   ondansetron (ZOFRAN) 4 MG tablet Take 1 tablet (4 mg total) by mouth every 8 (eight) hours as needed for nausea or vomiting. 20 tablet 0   polyethylene glycol  (MIRALAX / GLYCOLAX) 17 g packet Take 17 g by mouth daily as needed for mild constipation. 14 each 0   potassium chloride SA (KLOR-CON) 20 MEQ tablet Take 2 tablets (40 mEq total) by mouth 2 (two) times daily. (Patient not taking: Reported on 02/20/2023) 120 tablet 3   sacubitril-valsartan (ENTRESTO) 97-103 MG Take 1 tablet by mouth 2 (two) times daily. 60 tablet 6   spironolactone (ALDACTONE) 25 MG tablet TAKE 1 TABLET(25 MG) BY MOUTH DAILY 90 tablet 1   Vitamin D, Ergocalciferol, (DRISDOL) 1.25 MG (50000 UNIT) CAPS capsule Take 50,000 Units by mouth every Friday.     No current facility-administered medications on file prior to visit.    Allergies  Allergen Reactions   Metformin And Related Other (See Comments)    Kidney injury, GI upset, diarrhea   Farxiga [Dapagliflozin] Other (See Comments)    Yeast infections, recurrent    Blood pressure (!) 200/68.   Assessment/Plan:  1. Hypertension -  Essential hypertension, benign Assessment: BP is uncontrolled in office 1st BP 194/68  2nd measurement 200/68  Patient reports she has white coat syndrome  At home her BP ~158/73-74  heart rate 68 on validated BP cuff ( validated at PCP office) Tolerates current medications well without any side effects Denies SOB, palpitation, chest pain, headaches,or swelling Reiterated the importance of regular exercise and low salt diet   Plan:  Given elevated home BP on validated cuff appropriate to up clonidine dose from 0.1 mg twice daily to 0.2 mg twice daily   Continue taking carvedilol 25 mg twice daily,Imdur 60 mg daily, hydralazine 100 mg three times daily Lasix 40 mg daily, Entresto 97-103 mg twice daily, Spironolactone 25 mg daily  Patient to keep record of BP readings with heart rate and report to [place] at the next visit Patient to see PharmD in 4 weeks for follow up  Follow up lab(s) : none       Thank you  Carmela Hurt, Pharm.D [facility] A Division of [facility] [address], [place], [place] [address]  Phone: [phone]; Fax: [phone]

## 2023-04-01 NOTE — Patient Instructions (Addendum)
Changes made by your pharmacist Carmela Hurt, PharmD at today's visit:    Instructions/Changes  (what do you need to do) Your Notes  (what you did and when you did it)  1.increase clonidine from 0.1 mg to 0.2 mg twice daily    2. Continue taking carvedilol 25 mg twice daily, Imdur 60 mg daily, hydralazine 100 mg three times daily Lasix 40 mg daily, Entresto 97-103 mg twice daily, Spironolactone 25 mg daily    3. Continue following low salt diet and regular exercise    Bring all of your meds, your BP cuff and your record of home blood pressures to your next appointment.    HOW TO TAKE YOUR BLOOD PRESSURE AT HOME  Rest 5 minutes before taking your blood pressure.  Don't smoke or drink caffeinated beverages for at least 30 minutes before. Take your blood pressure before (not after) you eat. Sit comfortably with your back supported and both feet on the floor (don't cross your legs). Elevate your arm to heart level on a table or a desk. Use the proper sized cuff. It should fit smoothly and snugly around your bare upper arm. There should be enough room to slip a fingertip under the cuff. The bottom edge of the cuff should be 1 inch above the crease of the elbow. Ideally, take 3 measurements at one sitting and record the average.  Important lifestyle changes to control high blood pressure  Intervention  Effect on the BP  Lose extra pounds and watch your waistline Weight loss is one of the most effective lifestyle changes for controlling blood pressure. If you're overweight or obese, losing even a small amount of weight can help reduce blood pressure. Blood pressure might go down by about 1 millimeter of mercury (mm Hg) with each kilogram (about 2.2 pounds) of weight lost.  Exercise regularly As a general goal, aim for at least 30 minutes of moderate physical activity every day. Regular physical activity can lower high blood pressure by about 5 to 8 mm Hg.  Eat a healthy diet Eating a diet  rich in whole grains, fruits, vegetables, and low-fat dairy products and low in saturated fat and cholesterol. A healthy diet can lower high blood pressure by up to 11 mm Hg.  Reduce salt (sodium) in your diet Even a small reduction of sodium in the diet can improve heart health and reduce high blood pressure by about 5 to 6 mm Hg.  Limit alcohol One drink equals 12 ounces of beer, 5 ounces of wine, or 1.5 ounces of 80-proof liquor.  Limiting alcohol to less than one drink a day for women or two drinks a day for men can help lower blood pressure by about 4 mm Hg.   If you have any questions or concerns please use My Chart to send questions or call the office at [phone]

## 2023-04-07 NOTE — Progress Notes (Signed)
Office Visit Note  Patient: Maria Foster             Date of Birth: 1956-09-03           MRN: [id number]             PCP: Jac Canavan, PA-C Referring: Jac Canavan, PA-C Visit Date: 04/08/2023   Subjective:  Follow-up (Patient states she has questions about her medication. )   History of Present Illness: Maria Foster is a 67 y.o. female here for follow up for seropositive nodular rheumatoid arthritis on leflunomide 20 mg p.o. daily.  Has not seen much additional change in the hand and elbow nodules.  Not having significant hand pain or stiffness most days.  She noticed some worsening in her knee pain during recent rainy weather but on other days has no symptoms.  She feels she is noticing some gassiness or bloating with the leflunomide but not too severe and is still taking the medicine as directed.  She had recent labs checked in April which we reviewed her blood count is normal and metabolic panel shows estimated GFR of 51 which is stable for her baseline.   Previous HPI 12/24/22 Maria Foster is a 67 y.o. 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 daily.  Unfortunately had interruption with starting due to hospital admission for systemic inflammation starting from urinary tract infection.  Joint pain symptoms have not changed significantly.  She is having issue with ongoing nonproductive cough.  Also with frequent eye watering and some irritation no vision changes and no associated swelling.  She has upcoming evaluations for her lungs and congestive heart failure.     Previous HPI 09/26/22 Maria Foster is a 67 y.o. female here for follow up for seropositive RA we did not start methotrexate as planned due to renal impairment worse than baseline checked at clinic appointment.  Symptoms remain about the same and also not seeing any worsening with stopping hydroxychloroquine.   Previous HPI 07/02/2022 Maria Foster is a 67 y.o.  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  She feels like symptoms have made no significant difference on this treatment.  No recurrence of severe flareups like she had in her elbow either.  She does notice a few new and expanding nodules with episodes of spontaneous rupturing and white fluid drainage. Does not have much joint pain or swelling outside of the nodules.   Previous HPI 01/01/2022 Maria Foster is a 67 y.o. female here for follow up for seropositive RA on HCQ 200 mg daily. After our last visit with right elbow injection she developed worsening pain and then purulent drainage found to be MSSA infection of that right olecranon bursa and went to the hospital for surgical debridement of the wound. Wound culture was obtained also surgical report of rheumatoid nodule at the site. She took a course of antibiotics with PICC line completed without incident. She resumed hydroxychloroquine after this and she feels her joint pain is very well controlled at this time. She notices some increase in stiffness and pain with cold and changing weather.   Previous HPI 07/03/21 Maria Foster is a 67 y.o. female here for follow up for seropositive RA and chronic tophaceous gout. Recommended starting hydroxychloroquine treatment 200 mg PO daily for suspected RA as active disease and normal uric levels. She feels her symptoms are improved since starting this, particularly  states pain with weather and temperature changes are decreased. She has right elbow pain more than elsewhere says this hurts mostly due to pressure on the site when lying in bed or certain seated positions.   Previous HPI: 03/29/21 Maria Foster is a 67 y.o. female referred here for evaluation of rheumatoid arthritis.  She has a longstanding history of bilateral hand and knee pain that has been going on for years with some progressive worsening and deformity over time.  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  She also has chronic knee pain and stiffness bilaterally without any large nodular changes.  She does not have much joint pain most days however experiences some episodically and also feels that her hands and knees hurt worse with cold and rainy weather.  She describes a history of rheumatoid arthritis but has never been on treatment plans for this.  She states she has never had any episode of gout involving the toes or feet but has been told by her cardiologist this is a problem that she has.   She does not recall ever undergoing bone density screening for osteoporosis. She has a long smoking history but in recent months is either not smoking or very reduced usage most days.   Review of Systems  Constitutional:  Negative for fatigue.  HENT:  Negative for mouth sores and mouth dryness.   Eyes:  Positive for dryness.  Respiratory:  Negative for shortness of breath.   Cardiovascular:  Negative for chest pain and palpitations.  Gastrointestinal:  Negative for blood in stool, constipation and diarrhea.  Endocrine: Negative for increased urination.  Genitourinary:  Negative for involuntary urination.  Musculoskeletal:  Positive for joint pain, joint pain, joint swelling, muscle weakness and muscle tenderness. Negative for gait problem, myalgias, morning stiffness and myalgias.  Skin:  Positive for sensitivity to sunlight. Negative for color change, rash and hair loss.  Allergic/Immunologic: Positive for susceptible to infections.  Neurological:  Negative for dizziness and headaches.  Hematological:  Negative for swollen glands.  Psychiatric/Behavioral:  Negative for depressed mood and sleep disturbance. The patient is not nervous/anxious.     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 type 2 diabetes mellitus (HCC) 11/16/2021   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 2 diabetes mellitus (HCC) 11/16/2021   OSA (obstructive sleep apnea) 08/25/2021   Iron deficiency 08/25/2021   Screening for cancer 08/25/2021   Influenza vaccination declined 08/25/2021   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   Stage 3a chronic kidney disease (HCC) 07/11/2021   Septic olecranon bursitis of right elbow 07/11/2021   Essential hypertension, benign 05/04/2021   Hyperlipidemia associated with type 2 diabetes mellitus (HCC) 05/04/2021   Enlarged and hypertrophic nails 05/04/2021   Encounter for screening mammogram for malignant neoplasm of breast 05/04/2021   Need for pneumococcal vaccination 05/04/2021   CHF (congestive heart failure) (HCC) 05/04/2021   Stable angina 05/04/2021  Vaccine counseling 05/04/2021   Tobacco use 05/04/2021   High risk medication use 03/29/2021   Screen for colon cancer 01/27/2021   Tophi 01/11/2021   Rheumatoid arthritis involving both hands (HCC) 01/11/2021   Elevated troponin    Type 2 diabetes mellitus (HCC) 12/19/2020   Constipation 12/19/2020    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   OU progressive, will medically need to have cataract surgery with lens will implantation so as to allow for complete clearance of the vitreous cavity in anticipation of likely vitrectomy in the future for progressive PDR despite previous PRP as delineated by the E DRS series of reports    Diabetes mellitus type 2 with complications (HCC)    diagnosed probably 2012 per patient   Diabetic retinopathy (HCC)    Hypertension    Rheumatoid arthritis (HCC)     Family History  Problem Relation Age of Onset   Heart attack Mother    Heart failure Mother    Diabetes Mother    Hypertension Mother    Heart failure Sister    Heart Problems Sister    Heart failure Sister    Hypertension Daughter    Amblyopia Neg Hx    Blindness Neg Hx    Cataracts Neg Hx    Glaucoma Neg Hx    Macular degeneration Neg Hx    Retinal detachment Neg Hx    Strabismus Neg Hx    Retinitis pigmentosa Neg Hx    Past Surgical History:  Procedure Laterality Date   INCISION AND DRAINAGE ABSCESS N/A 07/11/2021   Procedure: INCISION AND DRAINAGE RIGHT ELBOW;  Surgeon: Durene Romans, MD;  Location: [facility];  Service: Orthopedics;  Laterality: N/A;   PRP Bilateral OD - 07/23/2017 OS - 08/06/2017   RIGHT/LEFT HEART CATH AND CORONARY ANGIOGRAPHY N/A 01/05/2021   Procedure: RIGHT/LEFT HEART CATH AND CORONARY ANGIOGRAPHY;  Surgeon: Dolores Patty, MD;  Location: [facility];  Service: Cardiovascular;  Laterality: N/A;   TEE WITHOUT CARDIOVERSION N/A 10/09/2021   Procedure: TRANSESOPHAGEAL ECHOCARDIOGRAM (TEE);  Surgeon: Dolores Patty, MD;  Location: [facility] ENDOS[facility];  Service: Cardiovascular;  Laterality: N/A;   Social History   Social History Narrative   Not on file   Immunization History  Administered Date(s) Administered   COVID-19, mRNA, vaccine(Comirnaty)12 years and older 12/14/2022   PFIZER(Purple Top)SARS-COV-2 Vaccination 02/09/2020, 03/01/2020, 11/05/2020   Pneumococcal Polysaccharide-23 05/04/2021   Tdap 07/11/2021     Objective: Vital Signs: BP (!) 190/76 (BP Location: Right Arm, Patient Position: Sitting, Cuff Size: Normal)   Pulse 71   Resp 14   Ht 5\' 3"  (1.6 m)   Wt 160 lb (72.6 kg)   BMI 28.34 kg/m    Physical Exam Eyes:     Conjunctiva/sclera: Conjunctivae normal.   Cardiovascular:     Rate and Rhythm: Normal rate and regular rhythm.  Pulmonary:     Effort: Pulmonary effort is normal.     Breath sounds: Normal breath sounds.  Musculoskeletal:     Right lower leg: No edema.     Left lower leg: No edema.  Lymphadenopathy:     Cervical: No cervical adenopathy.  Skin:    General: Skin is warm and dry.     Findings: No rash.  Neurological:     Mental Status: She is alert.  Psychiatric:        Mood and Affect: Mood normal.  Musculoskeletal Exam:  Shoulders full ROM no tenderness or swelling Elbows and wrists full ROM no tenderness, normal range of motion, overlying nontender slightly mobile nodules Fingers with some slightly restricted range of motion at MCP and PIP joints, no palpable synovitis but numerous nontender firm nodules mostly on dorsal side of joints Knees full ROM no tenderness or swelling, bilateral patellofemoral crepitus   CDAI Exam: CDAI Score: 3  Patient Global: 10 mm; Provider Global: 20 mm Swollen: 0 ; Tender: 0  Joint Exam 04/08/2023   All documented joints were normal     Investigation: No additional findings.  Imaging: No results found.  Recent Labs: Lab Results  Component Value Date   WBC 6.8 02/28/2023   HGB 13.8 02/28/2023   PLT 181 02/28/2023   NA 139 02/28/2023   K 4.3 02/28/2023   CL 105 02/28/2023   CO2 25 02/28/2023   GLUCOSE 151 (H) 02/28/2023   BUN 24 02/28/2023   CREATININE 1.17 (H) 02/28/2023   BILITOT 0.5 02/28/2023   ALKPHOS 85 10/30/2022   AST 15 02/28/2023   ALT 12 02/28/2023   PROT 7.9 02/28/2023   ALBUMIN 3.4 (L) 10/30/2022   CALCIUM 9.7 02/28/2023   GFRAA >60 11/29/2016    Speciality Comments: No specialty comments available.  Procedures:  No procedures performed Allergies: Metformin and related and Farxiga [dapagliflozin]   Assessment / Plan:     Visit Diagnoses: Rheumatoid arthritis involving both hands with positive rheumatoid factor (HCC) - Plan: leflunomide  (ARAVA) 20 MG tablet  Inflammation appears well-controlled still has extensive nodular disease discussed this can be very slow to improve I would not recommend escalation of immunosuppressive treatment unless there is more objective evidence of inflammation.  Plan to continue leflunomide 20 mg p.o. daily.  High risk medication use  Discussed side effects of leflunomide she had normal recent labs checked a month ago no cytopenia or hepatotoxicity and renal function remained stable.  GI intolerance including the gassiness and bloating could definitely be a side effect of leflunomide but seems tolerable so far.  Not bad enough to affect her diet or weight.  If this gets worse could consider dose reduction or change in treatment.  White coat syndrome with hypertension Essential hypertension, benign  Significant hypertension at today's visit this has been the case consistently at her previous clinic appointments.  Some increase in blood pressure can be associated with leflunomide treatment but does not seem to be any worse compared to review of previous records and clinic values.  No peripheral edema, no headache or chest pain complaints.  She has continued follow-up with cardiology as well as PCP office.  Orders: No orders of the defined types were placed in this encounter.  Meds ordered this encounter  Medications   leflunomide (ARAVA) 20 MG tablet    Sig: Take 1 tablet (20 mg total) by mouth daily.    Dispense:  90 tablet    Refill:  0     Follow-Up Instructions: Return in about 3 months (around 07/09/2023) for RA on LEF f/u 3mos.   Fuller Plan, MD  Note - This record has been created using [employer].  Chart creation errors have been sought, but may not always  have been located. Such creation errors do not reflect on  the standard of medical care.

## 2023-04-08 ENCOUNTER — Encounter: Payer: Self-pay | Admitting: Internal Medicine

## 2023-04-08 ENCOUNTER — Ambulatory Visit: Payer: Medicare Other | Attending: Internal Medicine | Admitting: Internal Medicine

## 2023-04-08 VITALS — BP 190/76 | HR 71 | Resp 14 | Ht 63.0 in | Wt 160.0 lb

## 2023-04-08 DIAGNOSIS — I10 Essential (primary) hypertension: Secondary | ICD-10-CM

## 2023-04-08 DIAGNOSIS — M05741 Rheumatoid arthritis with rheumatoid factor of right hand without organ or systems involvement: Secondary | ICD-10-CM

## 2023-04-08 DIAGNOSIS — M05742 Rheumatoid arthritis with rheumatoid factor of left hand without organ or systems involvement: Secondary | ICD-10-CM

## 2023-04-08 DIAGNOSIS — Z79899 Other long term (current) drug therapy: Secondary | ICD-10-CM

## 2023-04-08 MED ORDER — LEFLUNOMIDE 20 MG PO TABS
20.0000 mg | ORAL_TABLET | Freq: Every day | ORAL | 0 refills | Status: DC
Start: 2023-04-08 — End: 2023-07-09

## 2023-04-12 ENCOUNTER — Telehealth: Payer: Self-pay | Admitting: Cardiovascular Disease

## 2023-04-12 DIAGNOSIS — I10 Essential (primary) hypertension: Secondary | ICD-10-CM

## 2023-04-12 NOTE — Telephone Encounter (Signed)
Patient returning call for vascular test results. 

## 2023-04-12 NOTE — Telephone Encounter (Signed)
Returned call to patient, reviewed the following results! Repeat  testing ordered!     Mild blockage in the artery to the L kidney.  Normal flow to the right.  Repeat ultrasound in one year.

## 2023-04-17 ENCOUNTER — Other Ambulatory Visit: Payer: Self-pay | Admitting: Medical

## 2023-04-17 NOTE — Telephone Encounter (Signed)
Pt was given a 6 month supply in February. Should not be out

## 2023-04-23 ENCOUNTER — Ambulatory Visit: Payer: Medicare Other

## 2023-04-23 ENCOUNTER — Encounter: Payer: Self-pay | Admitting: Podiatry

## 2023-04-23 ENCOUNTER — Ambulatory Visit: Payer: Medicare Other | Admitting: Podiatry

## 2023-04-23 VITALS — BP 164/76

## 2023-04-23 DIAGNOSIS — L97521 Non-pressure chronic ulcer of other part of left foot limited to breakdown of skin: Secondary | ICD-10-CM | POA: Diagnosis not present

## 2023-04-23 DIAGNOSIS — B351 Tinea unguium: Secondary | ICD-10-CM

## 2023-04-23 DIAGNOSIS — E11621 Type 2 diabetes mellitus with foot ulcer: Secondary | ICD-10-CM | POA: Diagnosis not present

## 2023-04-23 DIAGNOSIS — M79672 Pain in left foot: Secondary | ICD-10-CM

## 2023-04-23 DIAGNOSIS — M0630 Rheumatoid nodule, unspecified site: Secondary | ICD-10-CM

## 2023-04-23 DIAGNOSIS — M79675 Pain in left toe(s): Secondary | ICD-10-CM | POA: Diagnosis not present

## 2023-04-23 DIAGNOSIS — E1142 Type 2 diabetes mellitus with diabetic polyneuropathy: Secondary | ICD-10-CM

## 2023-04-23 DIAGNOSIS — L84 Corns and callosities: Secondary | ICD-10-CM

## 2023-04-23 DIAGNOSIS — M79674 Pain in right toe(s): Secondary | ICD-10-CM | POA: Diagnosis not present

## 2023-04-23 MED ORDER — GENTAMICIN SULFATE 0.1 % EX CREA
TOPICAL_CREAM | CUTANEOUS | 0 refills | Status: DC
Start: 2023-04-23 — End: 2024-02-11

## 2023-04-23 NOTE — Progress Notes (Unsigned)
Subjective: Patient presents today {jgcomplaint:23593}  New concern today: Location: {jgPodToeLocator:23637}  Tysinger, Maria Balo, PA-C is patient's PCP. Last visit was {DATE MONTH DAY ZOXW:[id number]}.  Current Outpatient Medications on File Prior to Visit  Medication Sig Dispense Refill   acetaminophen (TYLENOL) 325 MG tablet Take 1-2 tablets (325-650 mg total) by mouth every 6 (six) hours as needed for mild pain (pain score 1-3 or temp > 100.5). 20 tablet 0   aspirin EC 81 MG tablet Take 81 mg by mouth every Monday, Wednesday, and Friday.     atorvastatin (LIPITOR) 40 MG tablet TAKE 1 TABLET(40 MG) BY MOUTH DAILY 90 tablet 3   carvedilol (COREG) 25 MG tablet Take 1 tablet (25 mg total) by mouth 2 (two) times daily. 180 tablet 3   cloNIDine (CATAPRES) 0.2 MG tablet Take 1 tablet (0.2 mg total) by mouth 2 (two) times daily. 180 tablet 3   empagliflozin (JARDIANCE) 10 MG TABS tablet TAKE 1 TABLET(10 MG) BY MOUTH DAILY BEFORE BREAKFAST 90 tablet 1   ferrous gluconate (FERGON) 324 MG tablet TAKE 1 TABLET(324 MG) BY MOUTH TWICE DAILY WITH A MEAL 180 tablet 1   furosemide (LASIX) 40 MG tablet Take 1 tablet (40 mg total) by mouth daily. 90 tablet 3   hydrALAZINE (APRESOLINE) 100 MG tablet Take 1 tablet (100 mg total) by mouth 3 (three) times daily. 200 tablet 3   isosorbide mononitrate (IMDUR) 60 MG 24 hr tablet TAKE 1 TABLET(60 MG) BY MOUTH DAILY 30 tablet 11   latanoprost (XALATAN) 0.005 % ophthalmic solution Place 1 drop into both eyes at bedtime.     leflunomide (ARAVA) 20 MG tablet Take 1 tablet (20 mg total) by mouth daily. 90 tablet 0   linagliptin (TRADJENTA) 5 MG TABS tablet Take 1 tablet (5 mg total) by mouth daily. 90 tablet 1   ondansetron (ZOFRAN) 4 MG tablet Take 1 tablet (4 mg total) by mouth every 8 (eight) hours as needed for nausea or vomiting. (Patient not taking: Reported on 04/08/2023) 20 tablet 0   polyethylene glycol (MIRALAX / GLYCOLAX) 17 g packet Take 17 g by mouth daily as  needed for mild constipation. 14 each 0   potassium chloride SA (KLOR-CON) 20 MEQ tablet Take 2 tablets (40 mEq total) by mouth 2 (two) times daily. 120 tablet 3   sacubitril-valsartan (ENTRESTO) 97-103 MG Take 1 tablet by mouth 2 (two) times daily. 60 tablet 6   spironolactone (ALDACTONE) 25 MG tablet TAKE 1 TABLET(25 MG) BY MOUTH DAILY 90 tablet 1   Vitamin D, Ergocalciferol, (DRISDOL) 1.25 MG (50000 UNIT) CAPS capsule Take 50,000 Units by mouth every Friday.     No current facility-administered medications on file prior to visit.     Allergies  Allergen Reactions   Metformin And Related Other (See Comments)    Kidney injury, GI upset, diarrhea   Farxiga [Dapagliflozin] Other (See Comments)    Yeast infections, recurrent     Objective: Vitals:   04/23/23 1518  BP: (!) 164/76    Maria Foster is a pleasant 67 y.o. female {jgbodyhabitus:24098}. AAO X 3.  Vascular Examination: {jgvascular:23595}  Dermatological Examination: {jgderm:23598}    Wound Location: {jgPodToeLocator:23637} There is a {minimal, moderate, considerable} amount of devitalized tissue present in the wound. Predebridement Wound Measurement:  ***  x *** cm Postdebridement Wound Measurement: *** x *** x *** cm. Wound Base: {findings; base ulcer:11056} Peri-wound: {Peri-wound:10864} Exudate: {:10862} Blood Loss during debridement: {NUMBERS; 0-10:5044} cc('s). Material in wound which inhibits healing/promotes adjacent  tissue breakdown:  {jgtissue:24134}. Description of tissue removed from ulceration today:  {jgtissue:24134}. Sign(s) of clinical bacterial infection: {JGSignsofInfection:24077}   Musculoskeletal Examination: {jgmsk:23600}  Neurological Examination: {jgneuro:23601::"Protective sensation intact 5/5 intact bilaterally with 10g monofilament b/l.","Vibratory sensation intact b/l.","Proprioception intact bilaterally."}  Xray findings  {jgPodToeLocator:23637}: {jgxrayfindings:23683}  Assessment and Plan 1. Left foot pain *** - DG Foot Complete Left; Future  2. Diabetic ulcer of toe of left foot associated with type 2 diabetes mellitus, limited to breakdown of skin (HCC) *** - gentamicin cream (GARAMYCIN) 0.1 %; Apply to left 5th toe once daily.  Dispense: 15 g; Refill: 0   -Patient was evaluated and treated and all questions answered.  -Patient/POA/Family member educated on diagnosis and treatment plan of routine ulcer debridement/wound care.  -Xrays of {jgPodToeLocator:23637} taken in office and reviewed with patient. -Ulceration debridement achieved utilizing sharp excisional debridement to level of *** with {jgtools:23895}. Type/amount of devitalized tissue removed: {jgtissue:24134} -Today's ulcer size post-debridement: *** x *** x *** cm. -Ulceration cleansed with wound cleanser. {jgwcproduct:24043} applied to base of ulceration and secured with light dressing. -Wound responded well to today's debridement. -Patient risk factors affecting healing of ulcer: {jgriskfactors:24044} -[name] given written instructions on daily wound care for {jgPodToeLocator:23637} ulceration. -{jgrx:23616} -Frequency of debridements needed to achieve healing: weekly to biweekly. -Wound culture and sensitivity ordered today. -Dispensed {JGDME:24132} for {jgPodToeLocator:23637}. If required, financial form/ABN signed on today's visit. -Consultations ordered today: {JGSpecialistReferral:27127} -Orders for Home Health Wound care to be faxed to: *** -Radiology ordered today: {Radiology:18555}. -{Jgplan:23602::"-Patient/POA to call should there be question/concern in the interim."}  Return in about 1 week (around 04/30/2023).  Freddie Breech, DPM

## 2023-04-29 ENCOUNTER — Ambulatory Visit (INDEPENDENT_AMBULATORY_CARE_PROVIDER_SITE_OTHER): Payer: Medicare Other | Admitting: Podiatry

## 2023-04-29 VITALS — BP 183/83 | HR 63 | Temp 97.0°F

## 2023-04-29 DIAGNOSIS — L97521 Non-pressure chronic ulcer of other part of left foot limited to breakdown of skin: Secondary | ICD-10-CM

## 2023-04-29 DIAGNOSIS — E11621 Type 2 diabetes mellitus with foot ulcer: Secondary | ICD-10-CM | POA: Diagnosis not present

## 2023-04-29 NOTE — Progress Notes (Unsigned)
Chief Complaint  Patient presents with   Foot Ulcer    Left foot ulcer in between 4th and 5th toe. Patient states that is has been a little moist in that area. Patient stated that no pain is present. She feels a little better.     HPI: 67 y.o. female presenting today for follow-up of ulcer to the left fourth interspace  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   OU progressive, will medically need to have cataract surgery with lens will implantation so as to allow for complete clearance of the vitreous cavity in anticipation of likely vitrectomy in the future for progressive PDR despite previous PRP as delineated by the E DRS series of reports   Diabetes mellitus type 2 with complications (HCC)    diagnosed probably 2012 per patient   Diabetic retinopathy (HCC)    Hypertension    Rheumatoid arthritis (HCC)     Past Surgical History:  Procedure Laterality Date   INCISION AND DRAINAGE ABSCESS N/A 07/11/2021   Procedure: INCISION AND DRAINAGE RIGHT ELBOW;  Surgeon: Durene Romans, MD;  Location: [facility];  Service: Orthopedics;  Laterality: N/A;   PRP Bilateral OD - 07/23/2017 OS - 08/06/2017   RIGHT/LEFT HEART CATH AND CORONARY ANGIOGRAPHY N/A 01/05/2021   Procedure: RIGHT/LEFT HEART CATH AND CORONARY ANGIOGRAPHY;  Surgeon: Dolores Patty, MD;  Location: [facility] INVASIVE CV [facility];  Service: Cardiovascular;  Laterality: N/A;   TEE WITHOUT CARDIOVERSION N/A 10/09/2021   Procedure: TRANSESOPHAGEAL ECHOCARDIOGRAM (TEE);  Surgeon: Dolores Patty, MD;  Location: [facility]COPY;  Service: Cardiovascular;  Laterality: N/A;    Allergies  Allergen Reactions   Metformin And Related Other (See Comments)    Kidney injury, GI upset, diarrhea   Farxiga [Dapagliflozin] Other (See Comments)    Yeast infections, recurrent     PHYSICAL EXAM:  General: The patient is alert and oriented x3 in no acute  distress.  Dermatology: Skin is warm, dry and supple bilateral lower extremities. Interspaces are clear of maceration and debris.      Wound 1 description:  Location:  left 4th interspace    Depth:  closed, intact skin    Wound Border:  clear    Odor?:  no    Surrounding Tissue:  slightly peeling skin    Infected?:  no    Necrosis?:  no    Pain?:  no    Tunneling:  np    Dimensions (cm):  n/a    Vascular: Pedal pulses are diminished  Neurological: Light touch sensation grossly intact bilateral feet.   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 Diabetic ulcer of toe of left foot associated with type 2 diabetes mellitus, limited to breakdown of skin (HCC)     The ulceration was sharply debrided of hyperkeratotic and devitalized soft tissue with sterile #312 blade.  Antibiotic ointment and Band-Aid applied.  Reviewed how to utilize the Band-Aid to pull the fifth toe laterally away from the fourth.  Patient instructed to apply Betadine to the interspace instead of the antibiotic ointment for now.  This will help dry the area further and prevent any maceration  Discussed risks / concerns regarding ulcer with patient and  possible sequelae if left untreated.  Stressed importance of infection prevention at home. Short-term goals are: Prevent infection, off-load ulcer, heal ulcer Long-term goals are:  prevent recurrence, prevent amputation.   Return if symptoms worsen or fail to improve, for or call in 10 days if toe still giving her any issues.   Clerance Lav, DPM, FACFAS [facility]     [address] [place], [place] [address]                Office [phone]  Fax [phone]

## 2023-05-01 ENCOUNTER — Other Ambulatory Visit: Payer: Self-pay | Admitting: Medical

## 2023-05-02 NOTE — Telephone Encounter (Signed)
Pt will call back to to schedule. She is having dental work done in the next 1-2 weeks and can't schedule anything. I advised I will refill for 30 days only but she needs an appointment before it runs out

## 2023-05-07 ENCOUNTER — Encounter: Payer: Self-pay | Admitting: Pharmacist Clinician (PhC)/ Clinical Pharmacy Specialist

## 2023-05-07 ENCOUNTER — Ambulatory Visit
Payer: Medicare Other | Attending: Cardiovascular Disease | Admitting: Pharmacist Clinician (PhC)/ Clinical Pharmacy Specialist

## 2023-05-07 VITALS — BP 145/73 | HR 60 | Ht 63.0 in | Wt 156.0 lb

## 2023-05-07 DIAGNOSIS — I10 Essential (primary) hypertension: Secondary | ICD-10-CM | POA: Diagnosis not present

## 2023-05-07 MED ORDER — CLONIDINE HCL 0.2 MG PO TABS: 0.2000 mg | ORAL_TABLET | Freq: Three times a day (TID) | ORAL | 3 refills | Status: DC

## 2023-05-07 NOTE — Progress Notes (Signed)
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 female patient of Dr Duke Salvia, in the office today for hypertension follow up.  When Dr. Duke Salvia saw her in April, pressure was at 210/68.  She switched metoprolol to carvedilol and added clonidine 0.1 mg bid.  The following month when she was then seen by Carmela Hurt, her pressure was not improved, at 200/68 and the clonidine was increased to 0.2 mg bid.    She returns today for follow up.  She has no complaints and states all is going well.  She has a complex arrangement for taking her medications, sounds like she has them divided to about 5 different times of the day, but she appears compliant with everything.    Blood Pressure Goal:  130/80  Current Medications: carvedilol 25 mg twice daily, Clonidine 0.2 mg twice daily, Imdur 60 mg daily (night - 7 pm), hydralazine 100 mg three times daily Lasix 40 mg daily (late morning), Entresto 97-103 mg twice daily, Spironolactone 25 mg daily  (with mid dose hydralazine - 6,2,10)  Previously tried:  amlodipine - HF, recovered LVEF (use only if necessary)  Family Hx:  mother had hypertension; had heart issues, died at 51; no hx of father; 1 sister with hypertension (8 siblings)  Social Hx:      Tobacco: no - quit  Alcohol: no  Caffeine:   switched to decaf since last visit; occasional Coke; mostly water  Diet:   cooks most meal, no added salt mostly (few with cooking); lots of fish and chicken, rare pork, some beef; vegetables usually frozen; not snakcing   Exercise: limited with RA  Home BP readings: yesterday 156/72, thinks that is about average, Sunday was 160 systolic  Adherence Assessment  Do you ever forget to take your medication? [] Yes [x] No  Do you ever skip doses due to side effects? [] Yes [x] No  Do you have trouble affording your medicines? [] Yes [x] No  Are you ever unable to pick up your medication due to transportation difficulties? [] Yes [x] No  Do you ever stop taking your medications because you don't believe they are helping? [] Yes [] No   Adherence strategy: routine for home; will use pill minder if going out of town  Accessory Clinical Findings    Lab Results  Component Value Date   CREATININE 1.17 (H) 02/28/2023   BUN 24 02/28/2023   NA 139 02/28/2023   K 4.3 02/28/2023   CL 105 02/28/2023   CO2 25 02/28/2023   Lab Results  Component Value Date   ALT 12 02/28/2023   AST 15 02/28/2023   ALKPHOS 85 10/30/2022   BILITOT 0.5 02/28/2023   Lab Results  Component Value Date   HGBA1C 7.2 (H) 10/30/2022  Home Medications    Current Outpatient Medications  Medication Sig Dispense Refill   cloNIDine (CATAPRES) 0.2 MG tablet Take 1 tablet (0.2 mg total) by mouth 3 (three) times daily. 270 tablet 3   acetaminophen (TYLENOL) 325 MG tablet Take 1-2 tablets (325-650 mg total) by mouth every 6 (six) hours as needed for mild pain (pain score 1-3 or temp > 100.5). 20 tablet 0   aspirin EC 81 MG tablet Take 81 mg by mouth every Monday, Wednesday, and Friday.     atorvastatin (LIPITOR) 40 MG tablet TAKE 1 TABLET(40 MG) BY MOUTH DAILY 90 tablet 3   carvedilol (COREG) 25 MG tablet Take 1 tablet (25 mg total) by mouth 2 (two) times daily. 180 tablet 3   empagliflozin (JARDIANCE) 10 MG TABS tablet  TAKE 1 TABLET(10 MG) BY MOUTH DAILY BEFORE BREAKFAST 90 tablet 1   ferrous gluconate (FERGON) 324 MG tablet TAKE 1 TABLET(324 MG) BY MOUTH TWICE DAILY WITH A MEAL 30 tablet 0   furosemide (LASIX) 40 MG tablet Take 1 tablet (40 mg total) by mouth daily. 90 tablet 3   gentamicin cream (GARAMYCIN) 0.1 % Apply to left 5th toe once daily. 15 g 0   hydrALAZINE (APRESOLINE) 100 MG tablet Take 1 tablet (100 mg total) by mouth 3 (three) times daily. 200 tablet 3   isosorbide mononitrate (IMDUR) 60 MG 24 hr tablet TAKE 1 TABLET(60 MG) BY MOUTH DAILY 30 tablet 11   latanoprost (XALATAN) 0.005 % ophthalmic solution Place 1 drop into both eyes at bedtime.     leflunomide (ARAVA) 20 MG tablet Take 1 tablet (20 mg total) by mouth daily. 90 tablet 0   linagliptin (TRADJENTA) 5 MG TABS tablet TAKE 1 TABLET(5 MG) BY MOUTH DAILY 30 tablet 0   ondansetron (ZOFRAN) 4 MG tablet Take 1 tablet (4 mg total) by mouth every 8 (eight) hours as needed for nausea or vomiting. (Patient not taking: Reported on 04/08/2023) 20 tablet 0   polyethylene glycol (MIRALAX / GLYCOLAX) 17 g packet Take 17 g by mouth daily as needed for mild constipation. 14 each 0   potassium chloride SA (KLOR-CON) 20 MEQ tablet Take 2 tablets (40 mEq total) by mouth 2 (two) times daily. 120 tablet 3   sacubitril-valsartan (ENTRESTO) 97-103 MG Take 1 tablet by mouth 2 (two) times daily. 60 tablet 6   spironolactone (ALDACTONE) 25 MG tablet TAKE 1 TABLET(25 MG) BY MOUTH DAILY 90 tablet 1   Vitamin D, Ergocalciferol, (DRISDOL) 1.25 MG (50000 UNIT) CAPS capsule Take 50,000 Units by mouth every Friday.     No current facility-administered medications for this visit.     HYPERTENSION CONTROL Vitals:   05/07/23 0915 05/07/23 0920  BP: (!) 160/78 (!) 145/73    The patient's blood pressure is elevated above target today.  In order to address the patient's elevated BP: A current anti-hypertensive medication was adjusted today.    Assessment & Plan     White coat syndrome with hypertension Assessment: BP is uncontrolled in office BP 145/73 mmHg;  above the goal (<130/80). 15 point drop in office after sitting quietly Tolerates current medications well without any side effects Denies SOB, palpitation, chest pain, headaches,or swelling Reiterated the importance of regular exercise and low salt diet   Plan:  Increase clonidine to 0.2 mg tid (take with hydralazine doses) Continue taking all other medications Patient to keep record of BP readings with heart rate and report to [place] at the next visit Patient to follow up  with PharmD in 6 weeks  Labs ordered today:  none   Phillips Hay PharmD CPP [facility]  [address] [place], [place] [address] [phone]

## 2023-05-07 NOTE — Patient Instructions (Signed)
Follow up appointment: Monday August  5 at 9:30 am  Take your BP meds as follows:  Increase clonidine to 0.2 mg THREE times daily (take with your hydralazine doses)  Continue with all other medications  Check your blood pressure at home daily (if able) and keep record of the readings.  Hypertension "High blood pressure"  Hypertension is often called "The Silent Killer." It rarely causes symptoms until it is extremely  high or has done damage to other organs in the body. For this reason, you should have your  blood pressure checked regularly by your physician. We will check your blood pressure  every time you see a provider at one of our offices.   Your blood pressure reading consists of two numbers. Ideally, blood pressure should be  below 120/80. The first ("top") number is called the systolic pressure. It measures the  pressure in your arteries as your heart beats. The second ("bottom") number is called the diastolic pressure. It measures the pressure in your arteries as the heart relaxes between beats.  The benefits of getting your blood pressure under control are enormous. A 10-point  reduction in systolic blood pressure can reduce your risk of stroke by 27% and heart failure by 28%  Your blood pressure goal is < 130/80  To check your pressure at home you will need to:  1. Sit up in a chair, with feet flat on the floor and back supported. Do not cross your ankles or legs. 2. Rest your left arm so that the cuff is about heart level. If the cuff goes on your upper arm,  then just relax the arm on the table, arm of the chair or your lap. If you have a wrist cuff, we  suggest relaxing your wrist against your chest (think of it as Pledging the Flag with the  wrong arm).  3. Place the cuff snugly around your arm, about 1 inch above the crook of your elbow. The  cords should be inside the groove of your elbow.  4. Sit quietly, with the cuff in place, for about 5 minutes. After that  5 minutes press the power  button to start a reading. 5. Do not talk or move while the reading is taking place.  6. Record your readings on a sheet of paper. Although most cuffs have a memory, it is often  easier to see a pattern developing when the numbers are all in front of you.  7. You can repeat the reading after 1-3 minutes if it is recommended  Make sure your bladder is empty and you have not had caffeine or tobacco within the last 30 min  Always bring your blood pressure log with you to your appointments. If you have not brought your monitor in to be double checked for accuracy, please bring it to your next appointment.  You can find a list of quality blood pressure cuffs at validatebp.org

## 2023-05-07 NOTE — Assessment & Plan Note (Signed)
Assessment: BP is uncontrolled in office BP 145/73 mmHg;  above the goal (<130/80). 15 point drop in office after sitting quietly Tolerates current medications well without any side effects Denies SOB, palpitation, chest pain, headaches,or swelling Reiterated the importance of regular exercise and low salt diet   Plan:  Increase clonidine to 0.2 mg tid (take with hydralazine doses) Continue taking all other medications Patient to keep record of BP readings with heart rate and report to [place] at the next visit Patient to follow up with PharmD in 6 weeks  Labs ordered today:  none

## 2023-05-20 ENCOUNTER — Telehealth: Payer: Self-pay

## 2023-05-20 NOTE — Telephone Encounter (Signed)
   Patient Name: Maria Foster  DOB: 20-Sep-1956 MRN: [id number]  Primary Cardiologist: Chilton Si, MD  Chart reviewed as part of pre-operative protocol coverage.   Regarding ASA therapy, it may be stopped 5-7 days prior to surgery with a plan to resume it as soon as felt to be feasible from a surgical standpoint in the post-operative period.   Patient does not require SBE prophylaxis prior to dental procedure.   Napoleon Form, Leodis Rains, NP 05/20/2023, 10:55 AM

## 2023-05-20 NOTE — Telephone Encounter (Signed)
   Pre-operative Risk Assessment    Patient Name: Maria Foster  DOB: 11-13-1956 MRN: [id number]     Request for Surgical Clearance    Procedure:  Deep Cleaning, 9 extractions, possible fillings, possible crowns, possible bridges, radiographs, root canal therapy, implant therapy,   Date of Surgery:  Clearance TBD                                 Surgeon:  Dr. Jasmine Pang  Surgeon's Group or Practice Name:  [facility]  Phone number:  [phone] Fax number:  [phone]   Type of Clearance Requested:   - Pharmacy:  [facility] Aspir[facility]     Type of Anesthesia:  Local    Additional requests/questions:    SignedVernard Gambles   05/20/2023, 10:47 AM

## 2023-05-31 ENCOUNTER — Other Ambulatory Visit: Payer: Self-pay | Admitting: Medical

## 2023-05-31 NOTE — Telephone Encounter (Signed)
Pt has an appt next week. We will check vitmain d next week

## 2023-06-03 ENCOUNTER — Other Ambulatory Visit (HOSPITAL_COMMUNITY): Payer: Self-pay

## 2023-06-03 MED ORDER — ISOSORBIDE MONONITRATE ER 60 MG PO TB24: 60.0000 mg | ORAL_TABLET | Freq: Every day | ORAL | 11 refills | Status: DC

## 2023-06-04 ENCOUNTER — Encounter: Payer: Self-pay | Admitting: Medical

## 2023-06-04 ENCOUNTER — Ambulatory Visit: Payer: Medicare Other | Admitting: Medical

## 2023-06-04 VITALS — BP 134/86 | HR 76 | Ht 63.0 in | Wt 158.0 lb

## 2023-06-04 DIAGNOSIS — M05742 Rheumatoid arthritis with rheumatoid factor of left hand without organ or systems involvement: Secondary | ICD-10-CM

## 2023-06-04 DIAGNOSIS — M05741 Rheumatoid arthritis with rheumatoid factor of right hand without organ or systems involvement: Secondary | ICD-10-CM | POA: Diagnosis not present

## 2023-06-04 DIAGNOSIS — E1169 Type 2 diabetes mellitus with other specified complication: Secondary | ICD-10-CM

## 2023-06-04 DIAGNOSIS — Z87891 Personal history of nicotine dependence: Secondary | ICD-10-CM | POA: Diagnosis not present

## 2023-06-04 DIAGNOSIS — E1101 Type 2 diabetes mellitus with hyperosmolarity with coma: Secondary | ICD-10-CM | POA: Diagnosis not present

## 2023-06-04 DIAGNOSIS — Z7185 Encounter for immunization safety counseling: Secondary | ICD-10-CM

## 2023-06-04 DIAGNOSIS — I509 Heart failure, unspecified: Secondary | ICD-10-CM

## 2023-06-04 DIAGNOSIS — N761 Subacute and chronic vaginitis: Secondary | ICD-10-CM | POA: Diagnosis not present

## 2023-06-04 DIAGNOSIS — T50905A Adverse effect of unspecified drugs, medicaments and biological substances, initial encounter: Secondary | ICD-10-CM

## 2023-06-04 DIAGNOSIS — I10 Essential (primary) hypertension: Secondary | ICD-10-CM | POA: Diagnosis not present

## 2023-06-04 DIAGNOSIS — R42 Dizziness and giddiness: Secondary | ICD-10-CM

## 2023-06-04 DIAGNOSIS — N1831 Chronic kidney disease, stage 3a: Secondary | ICD-10-CM

## 2023-06-04 DIAGNOSIS — E785 Hyperlipidemia, unspecified: Secondary | ICD-10-CM | POA: Diagnosis not present

## 2023-06-04 DIAGNOSIS — Z794 Long term (current) use of insulin: Secondary | ICD-10-CM | POA: Diagnosis not present

## 2023-06-04 LAB — POCT WET PREP (WET MOUNT)
Clue Cells Wet Prep Whiff POC: NEGATIVE
Trichomonas Wet Prep HPF POC: ABSENT

## 2023-06-04 LAB — POCT GLYCOSYLATED HEMOGLOBIN (HGB A1C): Hemoglobin A1C: 7.8 % — AB (ref 4.0–5.6)

## 2023-06-04 MED ORDER — SPIRONOLACTONE 25 MG PO TABS: 25.0000 mg | ORAL_TABLET | Freq: Every day | ORAL | 1 refills | Status: DC

## 2023-06-04 MED ORDER — FLUCONAZOLE 100 MG PO TABS: 100.0000 mg | ORAL_TABLET | Freq: Every day | ORAL | 0 refills | Status: DC

## 2023-06-04 MED ORDER — FERROUS GLUCONATE 324 (38 FE) MG PO TABS: 324.0000 mg | ORAL_TABLET | Freq: Every day | ORAL | 3 refills | Status: DC

## 2023-06-04 NOTE — Progress Notes (Signed)
Subjective:  Maria Foster is a 67 y.o. female who presents for Chief Complaint  Patient presents with   Diabetes    Fasting (cup of coffee with sugar at 10:30am) med check. She would like to know if there are any [facility] dentists.      Here for med check and chronic disease follow-up  Diabetes-she does check her blood sugars fairly regularly.  Typically stays around 130 fasting.  She does note some diet indiscretion in the past several weeks.  She just ate some peach cobbler this past week.  Compliant with Jardiance and Tradjenta.  She is on Jardiance for heart failure and diabetes.  No polyuria, polydipsia.  No blurred vision.  She sees heart failure clinic and cardiology.  She is compliant with her medications.  However recently clonidine was increased.  She was on it twice a day but this was increased to 3 times a day.  She says she feels loopy on this medication like she is drunk.  She felt that some at twice a day but once she was increased to 3 times a day is gotten worse.  She quit smoking this past year thankfully.  She is compliant with her other medications as usual  No chest pain, no swelling, no shortness of breath.  Since quitting smoking she does find herself craving more and eating more than prior  She notes getting vaginal irritation in mild discharge somewhat regularly to the point she is using over-the-counter antifungal cream daily for more than a month, maybe several months  No concern for STD.  No urinary complaint.  no other aggravating or relieving factors.    No other c/o.  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   OU progressive, will medically need to have cataract surgery with lens will implantation so as to allow for complete clearance of the vitreous cavity in anticipation of likely vitrectomy in the future for progressive PDR despite previous PRP as delineated by the E DRS  series of reports   Diabetes mellitus type 2 with complications (HCC)    diagnosed probably 2012 per patient   Diabetic retinopathy (HCC)    Hypertension    Rheumatoid arthritis (HCC)    Current Outpatient Medications on File Prior to Visit  Medication Sig Dispense Refill   aspirin EC 81 MG tablet Take 81 mg by mouth daily.     atorvastatin (LIPITOR) 40 MG tablet TAKE 1 TABLET(40 MG) BY MOUTH DAILY 90 tablet 3   carvedilol (COREG) 25 MG tablet Take 1 tablet (25 mg total) by mouth 2 (two) times daily. 180 tablet 3   cloNIDine (CATAPRES) 0.2 MG tablet Take 1 tablet (0.2 mg total) by mouth 3 (three) times daily. 270 tablet 3   empagliflozin (JARDIANCE) 10 MG TABS tablet TAKE 1 TABLET(10 MG) BY MOUTH DAILY BEFORE BREAKFAST 90 tablet 1   ferrous gluconate (FERGON) 324 MG tablet TAKE 1 TABLET(324 MG) BY MOUTH TWICE DAILY WITH A MEAL 30 tablet 0   furosemide (LASIX) 40 MG tablet Take 1 tablet (40 mg total) by mouth daily. 90 tablet 3   gentamicin cream (GARAMYCIN) 0.1 % Apply to left 5th toe once daily. 15 g 0   hydrALAZINE (APRESOLINE) 100 MG tablet Take 1 tablet (100 mg total) by mouth 3 (three) times daily. 200 tablet 3   isosorbide mononitrate (IMDUR) 60 MG 24 hr tablet Take 1 tablet (60 mg  total) by mouth daily. 30 tablet 11   latanoprost (XALATAN) 0.005 % ophthalmic solution Place 1 drop into both eyes at bedtime.     leflunomide (ARAVA) 20 MG tablet Take 1 tablet (20 mg total) by mouth daily. 90 tablet 0   linagliptin (TRADJENTA) 5 MG TABS tablet TAKE 1 TABLET(5 MG) BY MOUTH DAILY 30 tablet 0   potassium chloride SA (KLOR-CON) 20 MEQ tablet Take 2 tablets (40 mEq total) by mouth 2 (two) times daily. 120 tablet 3   sacubitril-valsartan (ENTRESTO) 97-103 MG Take 1 tablet by mouth 2 (two) times daily. 60 tablet 6   spironolactone (ALDACTONE) 25 MG tablet TAKE 1 TABLET(25 MG) BY MOUTH DAILY 90 tablet 1   Vitamin D, Ergocalciferol, (DRISDOL) 1.25 MG (50000 UNIT) CAPS capsule Take 50,000 Units by  mouth every Friday.     acetaminophen (TYLENOL) 325 MG tablet Take 1-2 tablets (325-650 mg total) by mouth every 6 (six) hours as needed for mild pain (pain score 1-3 or temp > 100.5). (Patient not taking: Reported on 06/04/2023) 20 tablet 0   ondansetron (ZOFRAN) 4 MG tablet Take 1 tablet (4 mg total) by mouth every 8 (eight) hours as needed for nausea or vomiting. (Patient not taking: Reported on 04/08/2023) 20 tablet 0   No current facility-administered medications on file prior to visit.     The following portions of the patient's history were reviewed and updated as appropriate: allergies, current medications, past family history, past medical history, past social history, past surgical history and problem list.  ROS Otherwise as in subjective above  Objective: BP 134/86   Pulse 76   Ht 5\' 3"  (1.6 m)   Wt 158 lb (71.7 kg)   SpO2 97%   BMI 27.99 kg/m   General appearance: alert, no distress, well developed, well nourished Neck: supple, no lymphadenopathy, no thyromegaly, no masses Heart: RRR, normal S1, S2, no murmurs Lungs: CTA bilaterally, no wheezes, rhonchi, or rales MSK: Nodular tophi nodules scatted including both elbows, both wrist and hands GU: Declined, did self wet prep today Pulses: 2+ radial pulses, 2+ pedal pulses, normal cap refill Ext: no edema  Diabetic Foot Exam - Simple   Simple Foot Form Diabetic Foot exam was performed with the following findings: Yes 06/04/2023  1:33 PM  Visual Inspection See comments: Yes Sensation Testing Intact to touch and monofilament testing bilaterally: Yes Pulse Check See comments: Yes Comments 1+ pedal pulses, chronic callus medial aspect of MTP of right great toe, flatfeet, otherwise no lesions      Assessment: Encounter Diagnoses  Name Primary?   Type 2 diabetes mellitus with hyperosmolar coma, with long-term current use of insulin (HCC) Yes   Chronic vaginitis    Adverse effect of drug, initial encounter     Dizziness    Stage 3a chronic kidney disease (HCC)    Tophi    Former smoker    Rheumatoid arthritis involving both hands with positive rheumatoid factor (HCC)    Essential hypertension, benign    Hyperlipidemia associated with type 2 diabetes mellitus (HCC)    Congestive heart failure, unspecified HF chronicity, unspecified heart failure type (HCC)    Vaccine counseling      Plan: Diabetes Hemoglobin A1c 7.8% today.  A little higher than goal.   Currently on Jardiance and Tradjenta however she is having frequent yeast infection symptoms with the Jardiance.  I will correspond with cardiology as she may need to come off of this or try something different given the  adverse effect Continue glucose monitoring and routine follow-up  Heart failure, hypertension-managed by cardiology and sees Pharm.D. as well.  I will reach out to them regarding the issues she is having with Jardiance and yeast infection symptoms.  I will correspond with cardiology about her issues with clonidine and Jardiance to get their advice as well.   Hyperlipidemia-continue atorvastatin 40 mg daily and aspirin 81 mg daily   Vaccine counseling I recommend you have a shingles vaccine at your pharmacy and make sure we are copied on the documentation  Yeast vaginitis-moderate yeast on wet prep today.  Begin Diflucan and will likely need to stop Jardiance due to recurrent symptoms for quite a while, several weeks   Rheumatoid arthritis, with nodular disease-follow-up with rheumatology as scheduled.  I reviewed her recent rheumatoid notes from Apr 08, 2023.  She has extensive nodular disease, continued on leflunomide 20 mg daily  CKD 3A-I reviewed some recent labs in the chart record that are stable   Elsbeth was seen today for diabetes.  Diagnoses and all orders for this visit:  Type 2 diabetes mellitus with hyperosmolar coma, with long-term current use of insulin (HCC) -     HgB A1c  Chronic vaginitis -     POCT  Wet Prep [facility])  Adverse effect of drug, initial encounter  Dizziness  Stage 3a chronic kidney disease (HCC)  Tophi  Former smoker  Rheumatoid arthritis involving both hands with positive rheumatoid factor (HCC)  Essential hypertension, benign  Hyperlipidemia associated with type 2 diabetes mellitus (HCC)  Congestive heart failure, unspecified HF chronicity, unspecified heart failure type (HCC)  Vaccine counseling  Other orders -     fluconazole (DIFLUCAN) 100 MG tablet; Take 1 tablet (100 mg total) by mouth daily.    Follow up: pending call back

## 2023-06-05 ENCOUNTER — Telehealth: Payer: Self-pay | Admitting: Pharmacist Clinician (PhC)/ Clinical Pharmacy Specialist

## 2023-06-05 MED ORDER — SPIRONOLACTONE 50 MG PO TABS: 50.0000 mg | ORAL_TABLET | Freq: Every day | ORAL | 6 refills | Status: DC

## 2023-06-05 MED ORDER — CLONIDINE HCL 0.1 MG PO TABS: 0.1000 mg | ORAL_TABLET | Freq: Two times a day (BID) | ORAL | 11 refills | Status: DC

## 2023-06-05 NOTE — Telephone Encounter (Signed)
Received message from Dr. Aleen Campi.  Patient shared some concerns with him yesterday.    Jardiance - has been causing chronic yeast infections.  Previously tried [place] which was worse.   Taking clonidine 0.2 mg tid - it's making her feel "drunk" , will cut back to 0.1 mg bid and increase spironolactone to 50 mg once daily.   Will need to do BMET in 2 weeks to monitor renal function and potassium

## 2023-06-06 NOTE — Progress Notes (Signed)
Pt was notified. Pt is agreeable to another medication as long as its not an injection

## 2023-06-07 ENCOUNTER — Other Ambulatory Visit: Payer: Self-pay | Admitting: Medical

## 2023-06-07 MED ORDER — GLIPIZIDE 5 MG PO TABS: 5.0000 mg | ORAL_TABLET | Freq: Two times a day (BID) | ORAL | 1 refills | Status: DC

## 2023-06-07 NOTE — Progress Notes (Signed)
Pt was notified of results

## 2023-06-07 NOTE — Progress Notes (Signed)
Left message for pt to call me back 

## 2023-06-19 NOTE — Telephone Encounter (Signed)
LM for patient to return call.  Changed meds ~2 weeks ago.  D/C clonidine and Jardiance, increased spironolactone to 50 mg daily.  Need to get BMET if still taking

## 2023-06-24 ENCOUNTER — Ambulatory Visit: Payer: Medicare Other

## 2023-06-25 NOTE — Progress Notes (Signed)
Office Visit Note  Patient: Maria Foster             Date of Birth: 09/13/1956           MRN: [id number]             PCP: Jac Canavan, PA-C Referring: Jac Canavan, PA-C Visit Date: 07/09/2023   Subjective:  Follow-up (Patient states her hand is a little swollen today. Patient states she was cleaning in her kitchen and she was cleaning her cabinets and counters and believes that is what the swelling is from. )   History of Present Illness: Maria Foster is a 67 y.o. female here for follow up for seropositive nodular rheumatoid arthritis on leflunomide 20 mg p.o. daily.  She has been doing well without any major flareup without significant joint pain or swelling until the past week.  She did a a lot of cleaning around the house including scrubbing of kitchen back splash and afterwards developed increased pain and swelling in the right wrist.  So far she is not taken anything extra or tried using her wrist band that she sometimes wears in the past for pain relief.  Previous HPI 04/08/2023 Maria Foster is a 67 y.o. female here for follow up for seropositive nodular rheumatoid arthritis on leflunomide 20 mg p.o. daily.  Has not seen much additional change in the hand and elbow nodules.  Not having significant hand pain or stiffness most days.  She noticed some worsening in her knee pain during recent rainy weather but on other days has no symptoms.  She feels she is noticing some gassiness or bloating with the leflunomide but not too severe and is still taking the medicine as directed.  She had recent labs checked in April which we reviewed her blood count is normal and metabolic panel shows estimated GFR of 51 which is stable for her baseline.   Previous HPI 12/24/22 Maria Foster is a 67 y.o. 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 daily.  Unfortunately had interruption with starting due to hospital admission for systemic inflammation  starting from urinary tract infection.  Joint pain symptoms have not changed significantly.  She is having issue with ongoing nonproductive cough.  Also with frequent eye watering and some irritation no vision changes and no associated swelling.  She has upcoming evaluations for her lungs and congestive heart failure.   Previous HPI 09/26/22 Maria Foster is a 67 y.o. female here for follow up for seropositive RA we did not start methotrexate as planned due to renal impairment worse than baseline checked at clinic appointment.  Symptoms remain about the same and also not seeing any worsening with stopping hydroxychloroquine.   Previous HPI 07/02/2022 Maria Foster is a 67 y.o. 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  She feels like symptoms have made no significant difference on this treatment.  No recurrence of severe flareups like she had in her elbow either.  She does notice a few new and expanding nodules with episodes of spontaneous rupturing and white fluid drainage. Does not have much joint pain or swelling outside of the nodules.   Previous HPI 01/01/2022 Maria Foster is a 67 y.o. female here for follow up for seropositive RA on HCQ 200 mg daily. After our last visit with right elbow injection she developed worsening pain and then purulent drainage found to be MSSA infection  of that right olecranon bursa and went to the hospital for surgical debridement of the wound. Wound culture was obtained also surgical report of rheumatoid nodule at the site. She took a course of antibiotics with PICC line completed without incident. She resumed hydroxychloroquine after this and she feels her joint pain is very well controlled at this time. She notices some increase in stiffness and pain with cold and changing weather.   Previous HPI 07/03/21 Maria Foster is a 67 y.o. female here for follow up for seropositive RA and chronic tophaceous gout. Recommended  starting hydroxychloroquine treatment 200 mg PO daily for suspected RA as active disease and normal uric levels. She feels her symptoms are improved since starting this, particularly states pain with weather and temperature changes are decreased. She has right elbow pain more than elsewhere says this hurts mostly due to pressure on the site when lying in bed or certain seated positions.   Previous HPI: 03/29/21 Maria Foster is a 67 y.o. female referred here for evaluation of rheumatoid arthritis.  She has a longstanding history of bilateral hand and knee pain that has been going on for years with some progressive worsening and deformity over time.  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  She also has chronic knee pain and stiffness bilaterally without any large nodular changes.  She does not have much joint pain most days however experiences some episodically and also feels that her hands and knees hurt worse with cold and rainy weather.  She describes a history of rheumatoid arthritis but has never been on treatment plans for this.  She states she has never had any episode of gout involving the toes or feet but has been told by her cardiologist this is a problem that she has.   She does not recall ever undergoing bone density screening for osteoporosis. She has a long smoking history but in recent months is either not smoking or very reduced usage most days.   Review of Systems  Constitutional:  Negative for fatigue.  HENT:  Negative for mouth sores and mouth dryness.   Eyes:  Positive for dryness.  Respiratory:  Negative for shortness of breath.   Cardiovascular:  Negative for chest pain and palpitations.  Gastrointestinal:  Negative for blood in stool, constipation and diarrhea.  Endocrine: Negative for  increased urination.  Genitourinary:  Negative for involuntary urination.  Musculoskeletal:  Positive for joint pain, joint pain, joint swelling, muscle weakness and muscle tenderness. Negative for gait problem, myalgias, morning stiffness and myalgias.  Skin:  Negative for color change, rash, hair loss and sensitivity to sunlight.  Allergic/Immunologic: Positive for susceptible to infections.  Neurological:  Negative for dizziness and headaches.  Hematological:  Negative for swollen glands.  Psychiatric/Behavioral:  Negative for depressed mood and sleep disturbance. The patient is not nervous/anxious.     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 type 2 diabetes mellitus (HCC)  11/16/2021   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 2 diabetes mellitus (HCC) 11/16/2021   OSA (obstructive sleep apnea) 08/25/2021   Iron deficiency 08/25/2021   Screening for cancer 08/25/2021   Influenza vaccination declined 08/25/2021   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   Stage 3a chronic kidney disease (HCC) 07/11/2021   Essential hypertension, benign 05/04/2021   Hyperlipidemia associated with type 2 diabetes mellitus (HCC) 05/04/2021   Enlarged and hypertrophic nails 05/04/2021   Encounter for screening mammogram for malignant neoplasm of breast 05/04/2021   Need for pneumococcal vaccination 05/04/2021   CHF (congestive heart failure) (HCC) 05/04/2021   Stable angina 05/04/2021   Vaccine counseling 05/04/2021   High risk medication use 03/29/2021   Screen for colon cancer  01/27/2021   Tophi 01/11/2021   Rheumatoid arthritis involving both hands (HCC) 01/11/2021   Elevated troponin    Type 2 diabetes mellitus (HCC) 12/19/2020   Constipation 12/19/2020    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   OU progressive, will medically need to have cataract surgery with lens will implantation so as to allow for complete clearance of the vitreous cavity in anticipation of likely vitrectomy in the future for progressive PDR despite previous PRP as delineated by the E DRS series of reports   Diabetes mellitus type 2 with complications (HCC)    diagnosed probably 2012 per patient   Diabetic retinopathy (HCC)    Hypertension    Rheumatoid arthritis (HCC)    Septic olecranon bursitis of right elbow 07/11/2021    Family History  Problem Relation Age of Onset   Heart attack Mother    Heart failure Mother    Diabetes Mother    Hypertension Mother    Heart failure Sister    Heart Problems Sister    Heart failure Sister    Hypertension Daughter    Amblyopia Neg Hx    Blindness Neg Hx    Cataracts Neg Hx    Glaucoma Neg Hx    Macular degeneration Neg Hx    Retinal detachment Neg Hx    Strabismus Neg Hx    Retinitis pigmentosa Neg Hx    Past Surgical History:  Procedure Laterality Date   INCISION AND DRAINAGE ABSCESS N/A 07/11/2021   Procedure: INCISION AND DRAINAGE RIGHT ELBOW;  Surgeon: Durene Romans, MD;  Location: [facility];  Service: Orthopedics;  Laterality: N/A;   PRP Bilateral OD - 07/23/2017 OS - 08/06/2017   RIGHT/LEFT HEART CATH AND CORONARY ANGIOGRAPHY N/A 01/05/2021   Procedure: RIGHT/LEFT HEART CATH AND CORONARY ANGIOGRAPHY;  Surgeon: Dolores Patty, MD;  Location: [facility];  Service: Cardiovascular;  Laterality: N/A;   TEE WITHOUT CARDIOVERSION N/A 10/09/2021   Procedure: TRANSESOPHAGEAL ECHOCARDIOGRAM (TEE);  Surgeon: Dolores Patty, MD;  Location: [facility]  ENDOSCOPY;  Service: Cardiovascular;  Laterality: N/A;   Social History   Social History Narrative   Not on file   Immunization History  Administered Date(s) Administered   COVID-19, mRNA, vaccine(Comirnaty)12 years and older 12/14/2022   PFIZER(Purple Top)SARS-COV-2 Vaccination 02/09/2020, 03/01/2020, 11/05/2020   Pneumococcal Polysaccharide-23 05/04/2021   Tdap 07/11/2021     Objective: Vital Signs: BP (!) 207/92 (BP Location: Left Arm, Patient Position: Sitting, Cuff Size: Normal)   Pulse 89   Resp 14   Ht 5\' 3"  (1.6 m)   Wt 159 lb (72.1 kg)   BMI 28.17 kg/m    Physical  Exam Eyes:     Conjunctiva/sclera: Conjunctivae normal.  Cardiovascular:     Rate and Rhythm: Normal rate and regular rhythm.  Pulmonary:     Effort: Pulmonary effort is normal.     Breath sounds: Normal breath sounds.  Musculoskeletal:     Right lower leg: No edema.     Left lower leg: No edema.  Lymphadenopathy:     Cervical: No cervical adenopathy.  Skin:    General: Skin is warm and dry.     Findings: No rash.  Neurological:     Mental Status: She is alert.  Psychiatric:        Mood and Affect: Mood normal.      Musculoskeletal Exam:  Shoulders full ROM no tenderness or swelling Elbows full ROM no tenderness or swelling, multiple nontender large mobile nodules on olecranon process and distal to elbow on forearm extensor surface Right wrist tenderness and swelling with decreased flexion extension range of motion, left wrist no pain or swelling but with multiple nodules on dorsal side Fingers chronic joint changes with numerous nontender nodules most extensive on MCP and PIP joints, slightly decreased flexion range of motion Knees full ROM no tenderness or swelling   CDAI Exam: CDAI Score: 6  Patient Global: 20 / 100; Provider Global: 20 / 100 Swollen: 1 ; Tender: 1  Joint Exam 07/09/2023      Right  Left  Wrist  Swollen Tender        Investigation: No additional  findings.  Imaging: No results found.  Recent Labs: Lab Results  Component Value Date   WBC 6.8 02/28/2023   HGB 13.8 02/28/2023   PLT 181 02/28/2023   NA 139 02/28/2023   K 4.3 02/28/2023   CL 105 02/28/2023   CO2 25 02/28/2023   GLUCOSE 151 (H) 02/28/2023   BUN 24 02/28/2023   CREATININE 1.17 (H) 02/28/2023   BILITOT 0.5 02/28/2023   ALKPHOS 85 10/30/2022   AST 15 02/28/2023   ALT 12 02/28/2023   PROT 7.9 02/28/2023   ALBUMIN 3.4 (L) 10/30/2022   CALCIUM 9.7 02/28/2023   GFRAA >60 11/29/2016    Speciality Comments: No specialty comments available.  Procedures:  No procedures performed Allergies: Metformin and related and Farxiga [dapagliflozin]   Assessment / Plan:     Visit Diagnoses: Rheumatoid arthritis involving both hands with positive rheumatoid factor (HCC) - Plan: Sedimentation rate, predniSONE (DELTASONE) 5 MG tablet, leflunomide (ARAVA) 20 MG tablet  Nodular seropositive RA today is more symptomatic than I have seen her at any of her previous visits with significant right wrist synovitis.  Was apparently provoked by overuse will continue same maintenance treatment for now.  Rechecking sed rate for disease activity monitoring.  Continue leflunomide 20 mg daily.  Will send in 8-day low-dose prednisone taper for current inflammation and resting her joint for now.  High risk medication use - leflunomide 20 mg p.o. daily. - Plan: CBC with Differential/Platelet, COMPLETE METABOLIC PANEL WITH GFR  Checking CBC and CMP for medication monitoring on continued daily use of leflunomide.  Tolerating medicine with no GI complaints.  No serious interval infections.  White coat syndrome with hypertension Essential hypertension, benign  Blood pressure is severely elevated today systolic in 190s repeat at 207/92 although this is not too far away from previous values including at cardiology appointment in May.  At other point in time it has been much lower and reports home  measurements are not at goal but more in 140s  to 160s range.  Currently reports just receiving some bad news so has a high level of stress as well as increased pain with joint inflammation.  Orders: Orders Placed This Encounter  Procedures   Sedimentation rate   CBC with Differential/Platelet   COMPLETE METABOLIC PANEL WITH GFR   Meds ordered this encounter  Medications   predniSONE (DELTASONE) 5 MG tablet    Sig: Take 4 tablets (20 mg total) by mouth daily with breakfast for 2 days, THEN 3 tablets (15 mg total) daily with breakfast for 2 days, THEN 2 tablets (10 mg total) daily with breakfast for 2 days, THEN 1 tablet (5 mg total) daily with breakfast for 2 days.    Dispense:  20 tablet    Refill:  0   leflunomide (ARAVA) 20 MG tablet    Sig: Take 1 tablet (20 mg total) by mouth daily.    Dispense:  90 tablet    Refill:  0     Follow-Up Instructions: Return in about 3 months (around 10/09/2023) for RA on LEF f/u 3mos.   Fuller Plan, MD  Note - This record has been created using [employer].  Chart creation errors have been sought, but may not always  have been located. Such creation errors do not reflect on  the standard of medical care.

## 2023-07-09 ENCOUNTER — Encounter: Payer: Self-pay | Admitting: Internal Medicine

## 2023-07-09 ENCOUNTER — Ambulatory Visit: Payer: Medicare Other | Attending: Internal Medicine | Admitting: Internal Medicine

## 2023-07-09 VITALS — BP 207/92 | HR 89 | Resp 14 | Ht 63.0 in | Wt 159.0 lb

## 2023-07-09 DIAGNOSIS — Z79899 Other long term (current) drug therapy: Secondary | ICD-10-CM | POA: Diagnosis not present

## 2023-07-09 DIAGNOSIS — I10 Essential (primary) hypertension: Secondary | ICD-10-CM

## 2023-07-09 DIAGNOSIS — M05741 Rheumatoid arthritis with rheumatoid factor of right hand without organ or systems involvement: Secondary | ICD-10-CM

## 2023-07-09 DIAGNOSIS — M05742 Rheumatoid arthritis with rheumatoid factor of left hand without organ or systems involvement: Secondary | ICD-10-CM

## 2023-07-09 MED ORDER — LEFLUNOMIDE 20 MG PO TABS
20.0000 mg | ORAL_TABLET | Freq: Every day | ORAL | 0 refills | Status: DC
Start: 2023-07-09 — End: 2023-10-07

## 2023-07-09 MED ORDER — PREDNISONE 5 MG PO TABS
ORAL_TABLET | ORAL | 0 refills | Status: AC
Start: 2023-07-09 — End: 2023-07-17

## 2023-07-10 LAB — CBC WITH DIFFERENTIAL/PLATELET
Absolute Monocytes: 540 {cells}/uL (ref 200–950)
Basophils Absolute: 73 {cells}/uL (ref 0–200)
Basophils Relative: 1 %
Eosinophils Absolute: 117 {cells}/uL (ref 15–500)
Eosinophils Relative: 1.6 %
HCT: 38.1 % (ref 35.0–45.0)
Hemoglobin: 12.2 g/dL (ref 11.7–15.5)
Lymphs Abs: 1088 {cells}/uL (ref 850–3900)
MCH: 29.2 pg (ref 27.0–33.0)
MCHC: 32 g/dL (ref 32.0–36.0)
MCV: 91.1 fL (ref 80.0–100.0)
MPV: 11.1 fL (ref 7.5–12.5)
Monocytes Relative: 7.4 %
Neutro Abs: 5482 {cells}/uL (ref 1500–7800)
Neutrophils Relative %: 75.1 %
Platelets: 187 10*3/uL (ref 140–400)
RBC: 4.18 10*6/uL (ref 3.80–5.10)
RDW: 12.3 % (ref 11.0–15.0)
Total Lymphocyte: 14.9 %
WBC: 7.3 10*3/uL (ref 3.8–10.8)

## 2023-07-10 LAB — COMPLETE METABOLIC PANEL WITH GFR
AG Ratio: 1.2 (calc) (ref 1.0–2.5)
ALT: 11 U/L (ref 6–29)
AST: 11 U/L (ref 10–35)
Albumin: 4.1 g/dL (ref 3.6–5.1)
Alkaline phosphatase (APISO): 191 U/L — ABNORMAL HIGH (ref 37–153)
BUN/Creatinine Ratio: 22 (calc) (ref 6–22)
BUN: 27 mg/dL — ABNORMAL HIGH (ref 7–25)
CO2: 26 mmol/L (ref 20–32)
Calcium: 9.5 mg/dL (ref 8.6–10.4)
Chloride: 103 mmol/L (ref 98–110)
Creat: 1.23 mg/dL — ABNORMAL HIGH (ref 0.50–1.05)
Globulin: 3.3 g/dL (ref 1.9–3.7)
Glucose, Bld: 261 mg/dL — ABNORMAL HIGH (ref 65–99)
Potassium: 4.2 mmol/L (ref 3.5–5.3)
Sodium: 137 mmol/L (ref 135–146)
Total Bilirubin: 0.4 mg/dL (ref 0.2–1.2)
Total Protein: 7.4 g/dL (ref 6.1–8.1)
eGFR: 48 mL/min/{1.73_m2} — ABNORMAL LOW (ref >=60)

## 2023-07-10 LAB — SEDIMENTATION RATE: Sed Rate: 43 mm/h — ABNORMAL HIGH (ref 0–30)

## 2023-07-10 NOTE — Progress Notes (Signed)
Sedimentation rate of 43 this is actually better than 6 months ago so inflammation appears mostly localized to the wrist.  Her blood sugar is quite high at 261.  This is a nonfasting glucose but still should not be significantly above 200.  Estimated GFR of 48 about the same as 51 4 months ago.  Her alkaline phosphatase this was increased at 191.  But the other liver enzyme test are normal so not sure if it is related to her leflunomide or not.  I think she is okay to continue the current medication plan we will recheck these at her next follow-up.

## 2023-07-15 ENCOUNTER — Encounter: Payer: Self-pay | Admitting: Pharmacist Clinician (PhC)/ Clinical Pharmacy Specialist

## 2023-07-15 ENCOUNTER — Ambulatory Visit: Payer: Medicare Other | Admitting: Pharmacist Clinician (PhC)/ Clinical Pharmacy Specialist

## 2023-07-15 ENCOUNTER — Other Ambulatory Visit (HOSPITAL_COMMUNITY): Payer: Self-pay

## 2023-07-15 VITALS — BP 148/76 | HR 68

## 2023-07-15 DIAGNOSIS — I10 Essential (primary) hypertension: Secondary | ICD-10-CM | POA: Diagnosis not present

## 2023-07-15 NOTE — Progress Notes (Signed)
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 female patient of Dr Duke Salvia, in the office today for hypertension follow up.  When Dr. Duke Salvia saw her in April, pressure was at 210/68.  She switched metoprolol to carvedilol and added clonidine 0.1 mg bid.  The following month when she was then seen by Carmela Hurt, her pressure was not improved, at 200/68 and the clonidine was increased to 0.2 mg bid.  I then saw her in June, pressure improved to 145/73 and we increased clonidine to three times daily.    She returns today for follow up.  Since I saw her last she had problems with her Jardiance and clonidine.  PCP stopped Jardiance and we decreased clonidine to 0.1 mg twice daily.  To compensate, spironolactone was increased to 50 mg daily.  Later at an appointment with rheumatology, and her pressure was 207/92, but she tells me she had just had an "ugly" conversation with a family member before going in for that appointment.  Today she is on a prednisone taper, now on day 5, at 10 mg for 2 days, then 5 mg for 2 more before stopping.  In looking at her fill history, it has been about 4.5 months since her carvedilol was filled, but she believes she is still taking.    Blood Pressure Goal:  130/80  Current Medications: carvedilol 25 mg twice daily, Clonidine  0.1 mg bid, Imdur 60 mg daily (night - 7 pm), hydralazine 100 mg three times daily Lasix 40 mg daily (late morning), Entresto 97-103 mg twice daily, Spironolactone 50 mg daily  (with mid dose hydralazine - 6,2,10)  Previously tried:  amlodipine - HF, recovered LVEF (use only if necessary)  Family Hx:  mother had hypertension; had heart issues, died at 73; no hx of father; 1 sister with hypertension (8 siblings)  Social Hx:      Tobacco: no - quit  Alcohol: no  Caffeine:  switched to decaf since last visit; occasional Coke; mostly water  Diet:   cooks most meal, no added salt mostly (few with cooking); lots of fish and chicken, rare pork, some beef; vegetables usually frozen; not snakcing   Exercise: limited with RA  Home BP readings: yesterday 156/72, thinks that is about average, Sunday was 160 systolic  Adherence Assessment  Do you ever forget to take your medication? [] Yes [x] No  Do you ever skip doses due to side effects? [] Yes [x] No  Do you have trouble affording your medicines? [] Yes [x] No  Are you ever unable to pick up your medication due to transportation difficulties? [] Yes [x] No  Do you ever stop taking your medications because you don't believe they are helping? [] Yes [] No   Adherence strategy: routine for home;  will use pill minder if going out of town  Accessory Clinical Findings    Lab Results  Component Value Date   CREATININE 1.23 (H) 07/09/2023   BUN 27 (H) 07/09/2023   NA 137 07/09/2023   K 4.2 07/09/2023   CL 103 07/09/2023   CO2 26 07/09/2023   Lab Results  Component Value Date   ALT 11 07/09/2023   AST 11 07/09/2023   ALKPHOS 85 10/30/2022   BILITOT 0.4 07/09/2023   Lab Results  Component Value Date   HGBA1C 7.8 (A) 06/04/2023    Home Medications    Current Outpatient Medications  Medication Sig Dispense Refill   acetaminophen (TYLENOL) 325 MG tablet Take 1-2 tablets (325-650 mg total) by mouth every 6 (six) hours as needed for mild  pain (pain score 1-3 or temp > 100.5). (Patient not taking: Reported on 06/04/2023) 20 tablet 0   aspirin EC 81 MG tablet Take 81 mg by mouth daily.     atorvastatin (LIPITOR) 40 MG tablet TAKE 1 TABLET(40 MG) BY MOUTH DAILY 90 tablet 3   carvedilol (COREG) 25 MG tablet Take 1 tablet (25 mg total) by mouth 2 (two) times daily. 180 tablet 3   cloNIDine (CATAPRES) 0.1 MG tablet Take 1 tablet (0.1 mg total) by mouth 2 (two) times daily. 60 tablet 11   ferrous gluconate (FERGON) 324 MG tablet Take 1 tablet (324 mg total) by mouth daily with breakfast. 90 tablet 3   fluconazole (DIFLUCAN) 100 MG tablet Take 1 tablet (100 mg total) by mouth daily. (Patient not taking: Reported on 07/09/2023) 7 tablet 0   furosemide (LASIX) 40 MG tablet Take 1 tablet (40 mg total) by mouth daily. 90 tablet 3   gentamicin cream (GARAMYCIN) 0.1 % Apply to left 5th toe once daily. (Patient not taking: Reported on 07/09/2023) 15 g 0   glipiZIDE (GLUCOTROL) 5 MG tablet TAKE 1 TABLET(5 MG) BY MOUTH TWICE DAILY 180 tablet 0   hydrALAZINE (APRESOLINE) 100 MG tablet Take 1 tablet (100 mg total) by mouth 3 (three) times daily. 200 tablet 3   isosorbide mononitrate (IMDUR) 60 MG 24 hr tablet Take 1 tablet (60 mg total) by mouth daily. 30 tablet 11   latanoprost (XALATAN) 0.005 % ophthalmic solution Place 1 drop into both eyes at bedtime.     leflunomide (ARAVA) 20 MG tablet Take 1 tablet (20 mg total) by mouth daily. 90 tablet 0   linagliptin (TRADJENTA) 5 MG TABS tablet TAKE 1 TABLET(5 MG) BY MOUTH DAILY 30 tablet 0   ondansetron (ZOFRAN) 4 MG tablet Take 1 tablet (4 mg total) by mouth every 8 (eight) hours as needed for nausea or vomiting. (Patient not taking: Reported on 04/08/2023) 20 tablet 0   predniSONE (DELTASONE) 5 MG tablet Take 4 tablets (20 mg total) by mouth daily with breakfast for 2 days, THEN 3 tablets (15 mg total) daily with breakfast for 2 days, THEN 2 tablets (10 mg total) daily with breakfast for 2 days, THEN 1 tablet  (5 mg total) daily with breakfast for 2 days. 20 tablet 0   sacubitril-valsartan (ENTRESTO) 97-103 MG Take 1 tablet by mouth 2 (two) times daily. 60 tablet 6   spironolactone (ALDACTONE) 50 MG tablet Take 1 tablet (50 mg total) by mouth daily. 30 tablet 6   No current facility-administered medications for this visit.     HYPERTENSION CONTROL Vitals:   07/15/23 1128 07/15/23 1135  BP: (!) 166/75 (!) 148/76    The patient's  blood pressure is elevated above target today.  In order to address the patient's elevated BP: Blood pressure will be monitored at home to determine if medication changes need to be made.    Assessment & Plan    Essential hypertension, benign Assessment: BP is uncontrolled in office BP 148/76 mmHg;  above the goal (<130/80). Based on fill history in Epic, unsure if she is currently taking carvedilol Tolerates current medications well without any side effects Denies SOB, palpitation, chest pain, headaches,or swelling Reiterated the importance of regular exercise and low salt diet   Plan:  Will transfer all medications to [facility] - having issues getting refills correct at [facility] Continue taking current medications Patient to keep record of BP readings with heart rate and report to [place] at the next visit Consider purchasing Omron cuff at [facility] Patient to follow up with PharmD in 6 weeks  Labs ordered today:  none   Phillips Hay PharmD CPP [facility]  [address] [place], [place] [address] [phone]

## 2023-07-15 NOTE — Patient Instructions (Signed)
Follow up appointment: Tuesday October 1 at 11 am  Take your BP meds as follows:  Carvedilol 25 mg twice daily,  Clonidine 0.1 mg bid,  Imdur 60 mg daily (night - 7 pm),  hydralazine 100 mg three times daily  Lasix 40 mg daily (late morning),  Entresto 97-103 mg twice daily,  Spironolactone 50 mg daily  (with mid dose hydralazine - 6,2,10)  I will call [facility] to get your medications switched to them from [facility]  Call them at [phone] to let them know which of your medications you need to actually fill  Check your blood pressure at home daily (if able) and keep record of the readings.  Hypertension "High blood pressure"  Hypertension is often called "The Silent Killer." It rarely causes symptoms until it is extremely  high or has done damage to other organs in the body. For this reason, you should have your  blood pressure checked regularly by your physician. We will check your blood pressure  every time you see a provider at one of our offices.   Your blood pressure reading consists of two numbers. Ideally, blood pressure should be  below 120/80. The first ("top") number is called the systolic pressure. It measures the  pressure in your arteries as your heart beats. The second ("bottom") number is called the diastolic pressure. It measures the pressure in your arteries as the heart relaxes between beats.  The benefits of getting your blood pressure under control are enormous. A 10-point  reduction in systolic blood pressure can reduce your risk of stroke by 27% and heart failure by 28%  Your blood pressure goal is < 130/80  To check your pressure at home you will need to:  1. Sit up in a chair, with feet flat on the floor and back supported. Do not cross your ankles or legs. 2. Rest your left arm so that the cuff is about heart level. If the cuff goes on your upper arm,  then just relax the arm on the table, arm of the chair or your lap. If you have  a wrist cuff, we  suggest relaxing your wrist against your chest (think of it as Pledging the Flag with the  wrong arm).  3. Place the cuff snugly around your arm, about 1 inch above the crook of your elbow. The  cords should be inside the groove of your elbow.  4. Sit quietly, with the cuff in place, for about 5 minutes. After that 5 minutes press the power  button to start a reading. 5. Do not talk or move while the reading is taking place.  6. Record your readings on a sheet of paper. Although most cuffs have a memory, it is often  easier to see a pattern developing when the numbers are all in front of you.  7. You can repeat the reading after 1-3 minutes if it is recommended  Make sure your bladder is empty and you have not had caffeine or tobacco within the last 30 min  Always bring your blood pressure log with you to your appointments. If you have not brought your monitor in to be double checked for accuracy, please bring it to your next appointment.  You can find a list of quality blood pressure cuffs at validatebp.org

## 2023-07-15 NOTE — Assessment & Plan Note (Signed)
Assessment: BP is uncontrolled in office BP 148/76 mmHg;  above the goal (<130/80). Based on fill history in Epic, unsure if she is currently taking carvedilol Tolerates current medications well without any side effects Denies SOB, palpitation, chest pain, headaches,or swelling Reiterated the importance of regular exercise and low salt diet   Plan:  Will transfer all medications to [facility] - having issues getting refills correct at [facility] Continue taking current medications Patient to keep record of BP readings with heart rate and report to [place] at the next visit Consider purchasing Omron cuff at [facility] Patient to follow up with PharmD in 6 weeks  Labs ordered today:  none

## 2023-08-04 ENCOUNTER — Other Ambulatory Visit: Payer: Self-pay | Admitting: Medical

## 2023-08-20 ENCOUNTER — Ambulatory Visit: Payer: Medicare Other

## 2023-09-14 ENCOUNTER — Other Ambulatory Visit: Payer: Self-pay | Admitting: Internal Medicine

## 2023-09-24 NOTE — Progress Notes (Signed)
Office Visit Note  Patient: Maria Foster             Date of Birth: 1956/06/12           MRN: [id number]             PCP: Jac Canavan, PA-C Referring: Jac Canavan, PA-C Visit Date: 10/07/2023   Subjective:  Follow-up (Patient states her left hand is bothering her. )   History of Present Illness: Maria Foster is a 68 y.o. female here for follow up for seropositive nodular rheumatoid arthritis on leflunomide 20 mg p.o. daily.  Overall most of her symptoms are doing well and has not noticed either increase nor decrease in the nodules.  Current worst complaint is pain at her left index finger.  This hurts at rest and with use and she notices more frequently if the finger getting stuck and some decrease in ability to close her hand.  Not associated with any change in sensation or loss of grip strength.   Previous HPI 07/09/2023 Maria Foster is a 67 y.o. female here for follow up for seropositive nodular rheumatoid arthritis on leflunomide 20 mg p.o. daily.  She has been doing well without any major flareup without significant joint pain or swelling until the past week.  She did a a lot of cleaning around the house including scrubbing of kitchen back splash and afterwards developed increased pain and swelling in the right wrist.  So far she is not taken anything extra or tried using her wrist band that she sometimes wears in the past for pain relief.   Previous HPI 04/08/2023 Maria Foster is a 67 y.o. female here for follow up for seropositive nodular rheumatoid arthritis on leflunomide 20 mg p.o. daily.  Has not seen much additional change in the hand and elbow nodules.  Not having significant hand pain or stiffness most days.  She noticed some worsening in her knee pain during recent rainy weather but on other days has no symptoms.  She feels she is noticing some gassiness or bloating with the leflunomide but not too severe and is still taking the medicine as directed.   She had recent labs checked in April which we reviewed her blood count is normal and metabolic panel shows estimated GFR of 51 which is stable for her baseline.   Previous HPI 12/24/22 Maria Foster is a 67 y.o. 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 daily.  Unfortunately had interruption with starting due to hospital admission for systemic inflammation starting from urinary tract infection.  Joint pain symptoms have not changed significantly.  She is having issue with ongoing nonproductive cough.  Also with frequent eye watering and some irritation no vision changes and no associated swelling.  She has upcoming evaluations for her lungs and congestive heart failure.   Previous HPI 09/26/22 Maria Foster is a 67 y.o. female here for follow up for seropositive RA we did not start methotrexate as planned due to renal impairment worse than baseline checked at clinic appointment.  Symptoms remain about the same and also not seeing any worsening with stopping hydroxychloroquine.   Previous HPI 07/02/2022 Maria Foster is a 67 y.o. 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  She feels like symptoms have made no significant difference on this treatment.  No recurrence of severe flareups like she had in her elbow either.  She does  notice a few new and expanding nodules with episodes of spontaneous rupturing and white fluid drainage. Does not have much joint pain or swelling outside of the nodules.   Previous HPI 01/01/2022 Maria Foster is a 67 y.o. female here for follow up for seropositive RA on HCQ 200 mg daily. After our last visit with right elbow injection she developed worsening pain and then purulent drainage found to be MSSA infection of that right olecranon bursa and went to the hospital for surgical debridement of the wound. Wound culture was obtained also surgical report of rheumatoid nodule at the site. She took a  course of antibiotics with PICC line completed without incident. She resumed hydroxychloroquine after this and she feels her joint pain is very well controlled at this time. She notices some increase in stiffness and pain with cold and changing weather.   Previous HPI 07/03/21 Maria Foster is a 67 y.o. female here for follow up for seropositive RA and chronic tophaceous gout. Recommended starting hydroxychloroquine treatment 200 mg PO daily for suspected RA as active disease and normal uric levels. She feels her symptoms are improved since starting this, particularly states pain with weather and temperature changes are decreased. She has right elbow pain more than elsewhere says this hurts mostly due to pressure on the site when lying in bed or certain seated positions.   Previous HPI: 03/29/21 Maria Foster is a 67 y.o. female referred here for evaluation of rheumatoid arthritis.  She has a longstanding history of bilateral hand and knee pain that has been going on for years with some progressive worsening and deformity over time.  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  She also has chronic knee pain and stiffness bilaterally without any large nodular changes.  She does not have much joint pain most days however experiences some episodically and also feels that her hands and knees hurt worse with cold and rainy weather.  She describes a history of rheumatoid arthritis but has never been on treatment plans for this.  She states she has never had any episode of gout involving the toes or feet but has been told by her cardiologist this is a problem that she has.   She does not recall ever undergoing bone density screening for osteoporosis. She has a long smoking history but in recent months is either not smoking or  very reduced usage most days.   Review of Systems  Constitutional:  Negative for fatigue.  HENT:  Negative for mouth sores and mouth dryness.   Eyes:  Negative for dryness.  Respiratory:  Negative for shortness of breath.   Cardiovascular:  Negative for chest pain and palpitations.  Gastrointestinal:  Negative for blood in stool, constipation and diarrhea.  Endocrine: Negative for increased urination.  Genitourinary:  Negative for involuntary urination.  Musculoskeletal:  Positive for joint pain and joint pain. Negative for gait problem, joint swelling, myalgias, muscle weakness, morning stiffness, muscle tenderness and myalgias.  Skin:  Negative for color change, rash, hair loss and sensitivity to sunlight.  Allergic/Immunologic: Positive for susceptible to infections.  Neurological:  Negative for dizziness and headaches.  Hematological:  Negative for swollen glands.  Psychiatric/Behavioral:  Negative for depressed mood and sleep disturbance. The patient is not nervous/anxious.     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 type 2 diabetes mellitus (HCC) 11/16/2021   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 2 diabetes mellitus (HCC) 11/16/2021   OSA (obstructive sleep apnea) 08/25/2021   Iron deficiency 08/25/2021   Screening for cancer 08/25/2021   Influenza vaccination declined 08/25/2021   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   Stage 3a chronic kidney disease (HCC) 07/11/2021   Essential hypertension, benign 05/04/2021   Hyperlipidemia associated with  type 2 diabetes mellitus (HCC) 05/04/2021   Enlarged and hypertrophic nails 05/04/2021   Encounter for screening mammogram for malignant neoplasm of breast 05/04/2021   Need for pneumococcal vaccination 05/04/2021   CHF (congestive heart failure) (HCC) 05/04/2021   Stable angina (HCC) 05/04/2021   Vaccine counseling 05/04/2021   High risk medication use 03/29/2021   Screen for colon cancer 01/27/2021   Tophi 01/11/2021   Rheumatoid arthritis involving both hands (HCC) 01/11/2021   Elevated troponin    Type 2 diabetes mellitus (HCC) 12/19/2020   Constipation 12/19/2020    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   OU progressive, will medically need to have cataract surgery with lens will implantation so as to allow for complete clearance of the vitreous cavity in anticipation of likely vitrectomy in the future for progressive PDR despite previous PRP as delineated by the E DRS series of reports   Diabetes mellitus type 2 with complications (HCC)    diagnosed probably 2012 per patient   Diabetic retinopathy (HCC)    Hypertension    Rheumatoid arthritis (HCC)    Septic olecranon bursitis of right elbow 07/11/2021    Family History  Problem Relation Age of Onset   Heart attack Mother    Heart failure Mother    Diabetes Mother    Hypertension Mother    Heart failure Sister    Heart Problems Sister    Heart failure Sister    Hypertension Daughter    Amblyopia Neg Hx    Blindness Neg Hx    Cataracts Neg Hx    Glaucoma Neg Hx    Macular degeneration Neg Hx    Retinal detachment Neg Hx    Strabismus Neg Hx    Retinitis pigmentosa Neg Hx    Past Surgical History:  Procedure Laterality Date   INCISION AND DRAINAGE ABSCESS N/A 07/11/2021   Procedure: INCISION AND DRAINAGE RIGHT ELBOW;  Surgeon: Durene Romans, MD;  Location: [facility];  Service: Orthopedics;  Laterality: N/A;   PRP Bilateral OD - 07/23/2017  OS - 08/06/2017   RIGHT/LEFT HEART CATH AND CORONARY ANGIOGRAPHY N/A 01/05/2021   Procedure: RIGHT/LEFT HEART CATH AND CORONARY ANGIOGRAPHY;  Surgeon: Dolores Patty, MD;  Location: [facility];  Service: Cardiovascular;  Laterality: N/A;   TEE WITHOUT CARDIOVERSION N/A 10/09/2021   Procedure: TRANSESOPHAGEAL ECHOCARDIOGRAM (TEE);  Surgeon: Dolores Patty, MD;  Location: [facility] ENDOSCOPY;  Service: Cardiovascular;  Laterality: N/A;   Social History   Social History Narrative   Not on file   Immunization History  Administered Date(s) Administered  PFIZER(Purple Top)SARS-COV-2 Vaccination 02/09/2020, 03/01/2020, 11/05/2020   Pfizer(Comirnaty)Fall Seasonal Vaccine 12 years and older 12/14/2022   Pneumococcal Polysaccharide-23 05/04/2021   Tdap 07/11/2021     Objective: Vital Signs: BP (!) 173/74 (BP Location: Left Arm, Patient Position: Sitting, Cuff Size: Normal)   Pulse 79   Resp 14   Ht 5\' 3"  (1.6 m)   Wt 164 lb (74.4 kg)   BMI 29.05 kg/m    Physical Exam Eyes:     Conjunctiva/sclera: Conjunctivae normal.  Cardiovascular:     Rate and Rhythm: Normal rate and regular rhythm.  Pulmonary:     Effort: Pulmonary effort is normal.     Breath sounds: Normal breath sounds.  Musculoskeletal:     Right lower leg: No edema.     Left lower leg: No edema.  Lymphadenopathy:     Cervical: No cervical adenopathy.  Skin:    General: Skin is warm and dry.     Findings: No rash.  Neurological:     Mental Status: She is alert.  Psychiatric:        Mood and Affect: Mood normal.      Musculoskeletal Exam:  Shoulders full ROM no tenderness or swelling Elbows full ROM no tenderness or swelling, multiple nontender large mobile nodules on olecranon process and on forearm extensor surface Right wrist tenderness and swelling with decreased flexion extension range of motion, left wrist no pain or swelling but with multiple nodules on dorsal side Fingers chronic joint changes  with numerous nontender nodules most extensive on MCP and PIP joints, slightly decreased flexion range of motion Left 2nd digit trigger, tenderness to pressure with nodule proximal to MCP joint on palmar side Knees full ROM no tenderness or swelling  Investigation: No additional findings.  Imaging: US Guided Needle Placement  Result Date: 10/08/2023 US Guided left 2nd digit trigger finger injection Ultrasound guided injection is preferred based studies that show increased duration, increased effect, greater accuracy, decreased procedural pain, increased response rate, and decreased cost with ultrasound guided versus blind injection. Verbal informed consent obtained.  Time-out conducted.  Noted no overlying erythema, induration, or other signs of local infection. Ultrasound-guided left 2nd digit trigger finger injection at A1 pulley. After sterile prep with Betadine, injected 0.5 mL lidocaine and 20 mg kenalog using a 27g needle, distal, volar approach. Images 1-2 show advancement of needle to tendon sheath. Images 3-10 show injection of medication. Images 11-12 show repositioning of needle. Images 13-15 showing injection of remaining medication more proximally.   Recent Labs: Lab Results  Component Value Date   WBC 8.2 10/07/2023   HGB 11.9 10/07/2023   PLT 171 10/07/2023   NA 139 10/07/2023   K 4.0 10/07/2023   CL 105 10/07/2023   CO2 26 10/07/2023   GLUCOSE 223 (H) 10/07/2023   BUN 20 10/07/2023   CREATININE 1.13 (H) 10/07/2023   BILITOT 0.7 10/07/2023   ALKPHOS 85 10/30/2022   AST 11 10/07/2023   ALT 8 10/07/2023   PROT 7.7 10/07/2023   ALBUMIN 3.4 (L) 10/30/2022   CALCIUM 9.4 10/07/2023   GFRAA >60 11/29/2016    Speciality Comments: No specialty comments available.  Procedures:  Hand/UE Inj: L index A1 for trigger finger on 10/07/2023 3:30 PM Indications: pain, tendon swelling and therapeutic Details: 27 G needle, ultrasound-guided volar approach Medications: 0.5 mL  lidocaine 1 %; 20 mg triamcinolone acetonide 40 MG/ML Outcome: tolerated well, no immediate complications Procedure, treatment alternatives, risks and benefits explained, specific risks discussed. Consent was  given by the patient. Immediately prior to procedure a time out was called to verify the correct patient, procedure, equipment, support staff and site/side marked as required. Patient was prepped and draped in the usual sterile fashion.     Allergies: Metformin and related and Farxiga [dapagliflozin]   Assessment / Plan:     Visit Diagnoses: Rheumatoid arthritis involving both hands with positive rheumatoid factor (HCC) - Plan: leflunomide (ARAVA) 20 MG tablet, Sedimentation rate  Extensive nodular disease currently increased pain and stiffness though I do not appreciate a lot of active synovitis.  Rechecking sedimentation rate for disease activity assessment.  Local steroid injection for trigger finger as discussed.  If elevated or continues having more widespread increased pain consider addition of short-term steroid taper.  Plan to continue leflunomide 20 mg daily.  High risk medication use - leflunomide 20 mg daily. - Plan: CBC with Differential/Platelet, COMPLETE METABOLIC PANEL WITH GFR  Checking CBC and CMP for medication monitoring on continued long-term use of leflunomide.  Tolerating medicine without GI complaint.  No serious interval infections.  White coat syndrome with hypertension Essential hypertension, benign  Consistently high blood pressure during clinical encounter.  Elevated today at 173/74 but has been pretty consistent with previous exams I do not think any contraindication to a local steroid injection with small medication dose.  Finger pain, left - Plan: US Guided Needle Placement  Worsening triggering pain and difficulty using left index finger with nodule and swelling proximal to MCP joint.  Proceeded with ultrasound-guided trigger finger injection was  well-tolerated.  Orders: Orders Placed This Encounter  Procedures   Hand/UE Inj   US Guided Needle Placement   Sedimentation rate   CBC with Differential/Platelet   COMPLETE METABOLIC PANEL WITH GFR   Meds ordered this encounter  Medications   leflunomide (ARAVA) 20 MG tablet    Sig: Take 1 tablet (20 mg total) by mouth daily.    Dispense:  90 tablet    Refill:  0     Follow-Up Instructions: Return in about 3 months (around 01/07/2024) for RA on LEf/inj f/u 3mos.   Fuller Plan, MD  Note - This record has been created using [employer].  Chart creation errors have been sought, but may not always  have been located. Such creation errors do not reflect on  the standard of medical care.

## 2023-10-07 ENCOUNTER — Encounter: Payer: Self-pay | Admitting: Internal Medicine

## 2023-10-07 ENCOUNTER — Ambulatory Visit: Payer: Medicare Other

## 2023-10-07 ENCOUNTER — Ambulatory Visit: Payer: Medicare Other | Attending: Internal Medicine | Admitting: Internal Medicine

## 2023-10-07 VITALS — BP 173/74 | HR 79 | Resp 14 | Ht 63.0 in | Wt 164.0 lb

## 2023-10-07 DIAGNOSIS — I10 Essential (primary) hypertension: Secondary | ICD-10-CM | POA: Diagnosis not present

## 2023-10-07 DIAGNOSIS — M05741 Rheumatoid arthritis with rheumatoid factor of right hand without organ or systems involvement: Secondary | ICD-10-CM

## 2023-10-07 DIAGNOSIS — Z79899 Other long term (current) drug therapy: Secondary | ICD-10-CM | POA: Diagnosis not present

## 2023-10-07 DIAGNOSIS — M79645 Pain in left finger(s): Secondary | ICD-10-CM

## 2023-10-07 DIAGNOSIS — M05742 Rheumatoid arthritis with rheumatoid factor of left hand without organ or systems involvement: Secondary | ICD-10-CM | POA: Diagnosis not present

## 2023-10-07 MED ORDER — LEFLUNOMIDE 20 MG PO TABS: 20.0000 mg | ORAL_TABLET | Freq: Every day | ORAL | 0 refills | Status: DC

## 2023-10-08 LAB — CBC WITH DIFFERENTIAL/PLATELET
Absolute Lymphocytes: 1222 {cells}/uL (ref 850–3900)
Absolute Monocytes: 492 {cells}/uL (ref 200–950)
Basophils Absolute: 82 {cells}/uL (ref 0–200)
Basophils Relative: 1 %
Eosinophils Absolute: 123 {cells}/uL (ref 15–500)
Eosinophils Relative: 1.5 %
HCT: 38.2 % (ref 35.0–45.0)
Hemoglobin: 11.9 g/dL (ref 11.7–15.5)
MCH: 29 pg (ref 27.0–33.0)
MCHC: 31.2 g/dL — ABNORMAL LOW (ref 32.0–36.0)
MCV: 92.9 fL (ref 80.0–100.0)
MPV: 11.5 fL (ref 7.5–12.5)
Monocytes Relative: 6 %
Neutro Abs: 6281 {cells}/uL (ref 1500–7800)
Neutrophils Relative %: 76.6 %
Platelets: 171 10*3/uL (ref 140–400)
RBC: 4.11 10*6/uL (ref 3.80–5.10)
RDW: 12.1 % (ref 11.0–15.0)
Total Lymphocyte: 14.9 %
WBC: 8.2 10*3/uL (ref 3.8–10.8)

## 2023-10-08 LAB — COMPLETE METABOLIC PANEL WITH GFR
AG Ratio: 1.3 (calc) (ref 1.0–2.5)
ALT: 8 U/L (ref 6–29)
AST: 11 U/L (ref 10–35)
Albumin: 4.3 g/dL (ref 3.6–5.1)
Alkaline phosphatase (APISO): 116 U/L (ref 37–153)
BUN/Creatinine Ratio: 18 (calc) (ref 6–22)
BUN: 20 mg/dL (ref 7–25)
CO2: 26 mmol/L (ref 20–32)
Calcium: 9.4 mg/dL (ref 8.6–10.4)
Chloride: 105 mmol/L (ref 98–110)
Creat: 1.13 mg/dL — ABNORMAL HIGH (ref 0.50–1.05)
Globulin: 3.4 g/dL (ref 1.9–3.7)
Glucose, Bld: 223 mg/dL — ABNORMAL HIGH (ref 65–99)
Potassium: 4 mmol/L (ref 3.5–5.3)
Sodium: 139 mmol/L (ref 135–146)
Total Bilirubin: 0.7 mg/dL (ref 0.2–1.2)
Total Protein: 7.7 g/dL (ref 6.1–8.1)
eGFR: 53 mL/min/{1.73_m2} — ABNORMAL LOW (ref >=60)

## 2023-10-08 LAB — SEDIMENTATION RATE: Sed Rate: 46 mm/h — ABNORMAL HIGH (ref 0–30)

## 2023-10-09 NOTE — Progress Notes (Signed)
Sed rate of 46 is about the same as before. Kidney function GFR of 53. Otherwise blood counts and liver function are normal. Hopefully the injection helps her hand a lot.

## 2023-10-25 MED ORDER — LIDOCAINE HCL 1 % IJ SOLN
0.5000 mL | INTRAMUSCULAR | Status: AC | PRN
  Administered 2023-10-07: .5 mL

## 2023-10-25 MED ORDER — TRIAMCINOLONE ACETONIDE 40 MG/ML IJ SUSP
20.0000 mg | INTRAMUSCULAR | Status: AC | PRN
  Administered 2023-10-07: 20 mg

## 2023-12-10 ENCOUNTER — Ambulatory Visit: Payer: Medicare Other

## 2023-12-10 DIAGNOSIS — Z0000 Encounter for general adult medical examination without abnormal findings: Secondary | ICD-10-CM

## 2023-12-10 NOTE — Patient Instructions (Signed)
Maria Foster , Thank you for taking time to come for your Medicare Wellness Visit. I appreciate your ongoing commitment to your health goals. Please review the following plan we discussed and let me know if I can assist you in the future.   Referrals/Orders/Follow-Ups/Clinician Recommendations: none  This is a list of the screening recommended for you and due dates:  Health Maintenance  Topic Date Due   Zoster (Shingles) Vaccine (1 of 2) Never done   Colon Cancer Screening  Never done   DEXA scan (bone density measurement)  Never done   Pneumonia Vaccine (2 of 2 - PCV) 05/04/2022   Mammogram  05/12/2023   COVID-19 Vaccine (5 - 2024-25 season) 07/21/2023   Eye exam for diabetics  10/09/2023   Hemoglobin A1C  12/05/2023   Yearly kidney health urinalysis for diabetes  12/15/2023   Flu Shot  02/17/2024*   Complete foot exam   06/03/2024   Yearly kidney function blood test for diabetes  10/06/2024   Medicare Annual Wellness Visit  12/09/2024   DTaP/Tdap/Td vaccine (2 - Td or Tdap) 07/12/2031   Hepatitis C Screening  Completed   HPV Vaccine  Aged Out  *Topic was postponed. The date shown is not the original due date.    Advanced directives: (ACP Link)Information on Advanced Care Planning can be found at [facility] Advance Health Care Directives Advance Health Care Directives ([email or web address]   Next Medicare Annual Wellness Visit scheduled for next year: Yes  insert Preventive Care attachment Insert FALL PREVENTION attachment if needed

## 2023-12-10 NOTE — Progress Notes (Signed)
Subjective:   Maria Foster is a 68 y.o. female who presents for Medicare Annual (Subsequent) preventive examination.  Visit Complete: Virtual I connected with  [name] on 12/10/23 by a audio enabled telemedicine application and verified that I am speaking with the correct person using two identifiers.  Interactive audio and video telecommunications were attempted between this provider and patient, however failed, due to patient having technical difficulties OR patient did not have access to video capability.  We continued and completed visit with audio only.  Patient Location: Home  Provider Location: Office/Clinic  I discussed the limitations of evaluation and management by telemedicine. The patient expressed understanding and agreed to proceed.  Vital Signs: Because this visit was a virtual/telehealth visit, some criteria may be missing or patient reported. Any vitals not documented were not able to be obtained and vitals that have been documented are patient reported.    Cardiac Risk Factors include: advanced age (>21men, >79 women);diabetes mellitus;dyslipidemia;hypertension     Objective:    Today's Vitals   There is no height or weight on file to calculate BMI.     12/10/2023   11:34 AM 12/04/2022   12:01 PM 10/30/2022    2:42 AM 10/29/2022    2:59 PM 11/17/2021   11:18 AM 10/09/2021    7:49 AM 07/10/2021    6:02 PM  Advanced Directives  Does Patient Have a Medical Advance Directive? No No No No Yes No No  Type of Psychiatric nurse of Healthcare Power of Attorney in Chart?     No - copy requested    Would patient like information on creating a medical advance directive?    No - Patient declined  No - Patient declined No - Patient declined    Current Medications (verified) Outpatient Encounter Medications as of 12/10/2023  Medication Sig   acetaminophen (TYLENOL) 325 MG tablet Take 1-2 tablets (325-650 mg total) by  mouth every 6 (six) hours as needed for mild pain (pain score 1-3 or temp > 100.5).   aspirin EC 81 MG tablet Take 81 mg by mouth daily.   atorvastatin (LIPITOR) 40 MG tablet TAKE 1 TABLET(40 MG) BY MOUTH DAILY   cloNIDine (CATAPRES) 0.1 MG tablet Take 1 tablet (0.1 mg total) by mouth 2 (two) times daily.   ENTRESTO 97-103 MG TAKE 1 TABLET BY MOUTH TWICE DAILY   furosemide (LASIX) 40 MG tablet Take 1 tablet (40 mg total) by mouth daily.   glipiZIDE (GLUCOTROL) 5 MG tablet TAKE 1 TABLET(5 MG) BY MOUTH TWICE DAILY   hydrALAZINE (APRESOLINE) 100 MG tablet Take 1 tablet (100 mg total) by mouth 3 (three) times daily.   isosorbide mononitrate (IMDUR) 60 MG 24 hr tablet Take 1 tablet (60 mg total) by mouth daily.   latanoprost (XALATAN) 0.005 % ophthalmic solution Place 1 drop into both eyes at bedtime.   leflunomide (ARAVA) 20 MG tablet Take 1 tablet (20 mg total) by mouth daily.   linagliptin (TRADJENTA) 5 MG TABS tablet TAKE 1 TABLET(5 MG) BY MOUTH DAILY   spironolactone (ALDACTONE) 50 MG tablet Take 1 tablet (50 mg total) by mouth daily.   carvedilol (COREG) 25 MG tablet Take 1 tablet (25 mg total) by mouth 2 (two) times daily. (Patient not taking: Reported on 12/10/2023)   ferrous gluconate (FERGON) 324 MG tablet Take 1 tablet (324 mg total) by mouth daily with breakfast. (Patient not taking: Reported on 12/10/2023)  fluconazole (DIFLUCAN) 100 MG tablet Take 1 tablet (100 mg total) by mouth daily. (Patient not taking: Reported on 12/10/2023)   gentamicin cream (GARAMYCIN) 0.1 % Apply to left 5th toe once daily. (Patient not taking: Reported on 12/10/2023)   metoprolol succinate (TOPROL-XL) 50 MG 24 hr tablet  (Patient not taking: Reported on 12/10/2023)   ondansetron (ZOFRAN) 4 MG tablet Take 1 tablet (4 mg total) by mouth every 8 (eight) hours as needed for nausea or vomiting. (Patient not taking: Reported on 10/07/2023)   No facility-administered encounter medications on file as of 12/10/2023.     Allergies (verified) Metformin and related and Farxiga [dapagliflozin]   History: 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   OU progressive, will medically need to have cataract surgery with lens will implantation so as to allow for complete clearance of the vitreous cavity in anticipation of likely vitrectomy in the future for progressive PDR despite previous PRP as delineated by the E DRS series of reports   Diabetes mellitus type 2 with complications (HCC)    diagnosed probably 2012 per patient   Diabetic retinopathy (HCC)    Hypertension    Rheumatoid arthritis (HCC)    Septic olecranon bursitis of right elbow 07/11/2021   Past Surgical History:  Procedure Laterality Date   INCISION AND DRAINAGE ABSCESS N/A 07/11/2021   Procedure: INCISION AND DRAINAGE RIGHT ELBOW;  Surgeon: Durene Romans, MD;  Location: [facility];  Service: Orthopedics;  Laterality: N/A;   PRP Bilateral OD - 07/23/2017 OS - 08/06/2017   RIGHT/LEFT HEART CATH AND CORONARY ANGIOGRAPHY N/A 01/05/2021   Procedure: RIGHT/LEFT HEART CATH AND CORONARY ANGIOGRAPHY;  Surgeon: Dolores Patty, MD;  Location: [facility];  Service: Cardiovascular;  Laterality: N/A;   TEE WITHOUT CARDIOVERSION N/A 10/09/2021   Procedure: TRANSESOPHAGEAL ECHOCARDIOGRAM (TEE);  Surgeon: Dolores Patty, MD;  Location: [facility];  Service: Cardiovascular;  Laterality: N/A;   Family History  Problem Relation Age of Onset   Heart attack Mother    Heart failure Mother    Diabetes Mother    Hypertension Mother    Heart failure Sister    Heart Problems Sister    Heart failure Sister    Hypertension Daughter    Amblyopia Neg Hx    Blindness Neg Hx    Cataracts Neg Hx    Glaucoma Neg Hx    Macular degeneration Neg Hx    Retinal detachment Neg Hx    Strabismus Neg Hx    Retinitis pigmentosa Neg Hx    Social History   Socioeconomic History    Marital status: Married    Spouse name: Not on file   Number of children: Not on file   Years of education: Not on file   Highest education level: Not on file  Occupational History   Occupation: retired  Tobacco Use   Smoking status: Former    Current packs/day: 0.00    Average packs/day: 0.5 packs/day for 15.0 years (7.5 ttl pk-yrs)    Types: Cigarettes    Start date: 09/2007    Quit date: 09/2022    Years since quitting: 1.2    Passive exposure: Past   Smokeless tobacco: Never   Tobacco comments:    Quit 10/28/2022  Vaping Use   Vaping status: Never Used  Substance and Sexual Activity   Alcohol use: Not Currently   Drug use: No  Sexual activity: Not on file  Other Topics Concern   Not on file  Social History Narrative   Not on file   Social Drivers of Health   Financial Resource Strain: Low Risk  (12/10/2023)   Overall Financial Resource Strain (CARDIA)    Difficulty of Paying Living Expenses: Not hard at all  Food Insecurity: No Food Insecurity (12/10/2023)   Hunger Vital Sign    Worried About Running Out of Food in the Last Year: Never true    Ran Out of Food in the Last Year: Never true  Transportation Needs: No Transportation Needs (12/10/2023)   PRAPARE - Administrator, Civil Service (Medical): No    Lack of Transportation (Non-Medical): No  Physical Activity: Insufficiently Active (12/10/2023)   Exercise Vital Sign    Days of Exercise per Week: 7 days    Minutes of Exercise per Session: 20 min  Stress: No Stress Concern Present (12/10/2023)   [employer] of Occupational Health - Occupational Stress Questionnaire    Feeling of Stress : Not at all  Social Connections: Moderately Isolated (12/10/2023)   Social Connection and Isolation Panel [NHANES]    Frequency of Communication with Friends and Family: More than three times a week    Frequency of Social Gatherings with Friends and Family: Once a week    Attends Religious Services: Never     Database administrator or Organizations: No    Attends Banker Meetings: Never    Marital Status: Married    Tobacco Counseling Counseling given: Not Answered Tobacco comments: Quit 10/28/2022   Clinical Intake:  Pre-visit preparation completed: Yes  Pain : No/denies pain     Nutritional Risks: None Diabetes: Yes CBG done?: No Did pt. bring in CBG monitor from home?: No  How often do you need to have someone help you when you read instructions, pamphlets, or other written materials from your doctor or pharmacy?: 1 - Never  Interpreter Needed?: No  Information entered by :: NAllen LPN   Activities of Daily Living    12/10/2023   11:28 AM  In your present state of health, do you have any difficulty performing the following activities:  Hearing? 0  Vision? 0  Difficulty concentrating or making decisions? 0  Walking or climbing stairs? 0  Dressing or bathing? 0  Doing errands, shopping? 0  Preparing Food and eating ? N  Using the Toilet? N  In the past six months, have you accidently leaked urine? N  Do you have problems with loss of bowel control? N  Managing your Medications? N  Managing your Finances? N  Housekeeping or managing your Housekeeping? N    Patient Care Team: Tysinger, Kermit Balo, PA-C as PCP - General (Family Medicine) Chilton Si, MD as PCP - Cardiology (Cardiology) [facility]  Indicate any recent Medical Services you may have received from other than [facility] providers in the past year (date may be approximate).     Assessment:   This is a routine wellness examination for Ashelly.  Hearing/Vision screen Hearing Screening - Comments:: Denies hearing issues Vision Screening - Comments:: Regular eye exams, [facility]   Goals Addressed             This Visit's Progress    Patient Stated       12/10/2023, wants to lose weight       Depression Screen    12/10/2023   11:38 AM 12/04/2022   12:02  PM  11/17/2021   11:19 AM 10/18/2021   10:49 AM 08/30/2021    3:34 PM 07/20/2021   11:58 AM 05/04/2021   11:19 AM  PHQ 2/9 Scores  PHQ - 2 Score 0 0 0 0 0 0 0  PHQ- 9 Score  0         Fall Risk    12/10/2023   11:37 AM 12/04/2022   12:02 PM 11/17/2021   11:18 AM 10/18/2021   10:49 AM 08/30/2021    3:29 PM  Fall Risk   Falls in the past year? 0 0 0 0 0  Number falls in past yr: 0 0  0   Injury with Fall? 0 0  0   Risk for fall due to : Medication side effect Medication side effect Medication side effect No Fall Risks   Follow up Falls prevention discussed;Falls evaluation completed Falls prevention discussed;Education provided;Falls evaluation completed Falls evaluation completed;Education provided;Falls prevention discussed Falls evaluation completed Falls evaluation completed;Education provided    MEDICARE RISK AT HOME: Medicare Risk at Home Any stairs in or around the home?: Yes If so, are there any without handrails?: No Home free of loose throw rugs in walkways, pet beds, electrical cords, etc?: Yes Adequate lighting in your home to reduce risk of falls?: Yes Life alert?: No Use of a cane, walker or w/c?: No Grab bars in the bathroom?: No Shower chair or bench in shower?: No Elevated toilet seat or a handicapped toilet?: Yes  TIMED UP AND GO:  Was the test performed?  No    Cognitive Function:        12/10/2023   11:38 AM 12/04/2022   12:04 PM 11/17/2021   11:20 AM  6CIT Screen  What Year? 0 points 0 points 0 points  What month? 0 points 0 points 0 points  What time? 0 points 0 points 0 points  Count back from 20 0 points 0 points 0 points  Months in reverse 0 points 0 points 0 points  Repeat phrase 0 points 0 points 0 points  Total Score 0 points 0 points 0 points    Immunizations Immunization History  Administered Date(s) Administered   PFIZER(Purple Top)SARS-COV-2 Vaccination 02/09/2020, 03/01/2020, 11/05/2020   Pfizer(Comirnaty)Fall Seasonal Vaccine 12  years and older 12/14/2022   Pneumococcal Polysaccharide-23 05/04/2021   Tdap 07/11/2021    TDAP status: Up to date  Flu Vaccine status: Declined, Education has been provided regarding the importance of this vaccine but patient still declined. Advised may receive this vaccine at local pharmacy or Health Dept. Aware to provide a copy of the vaccination record if obtained from local pharmacy or Health Dept. Verbalized acceptance and understanding.  Pneumococcal vaccine status: Due, Education has been provided regarding the importance of this vaccine. Advised may receive this vaccine at local pharmacy or Health Dept. Aware to provide a copy of the vaccination record if obtained from local pharmacy or Health Dept. Verbalized acceptance and understanding.  Covid-19 vaccine status: Information provided on how to obtain vaccines.   Qualifies for Shingles Vaccine? Yes   Zostavax completed No   Shingrix Completed?: No.    Education has been provided regarding the importance of this vaccine. Patient has been advised to call insurance company to determine out of pocket expense if they have not yet received this vaccine. Advised may also receive vaccine at local pharmacy or Health Dept. Verbalized acceptance and understanding.  Screening Tests Health Maintenance  Topic Date Due   Zoster Vaccines- Shingrix (1  of 2) Never done   Colonoscopy  Never done   DEXA SCAN  Never done   Pneumonia Vaccine 6+ Years old (2 of 2 - PCV) 05/04/2022   MAMMOGRAM  05/12/2023   COVID-19 Vaccine (5 - 2024-25 season) 07/21/2023   OPHTHALMOLOGY EXAM  10/09/2023   HEMOGLOBIN A1C  12/05/2023   Diabetic kidney evaluation - Urine ACR  12/15/2023   INFLUENZA VACCINE  02/17/2024 (Originally 06/20/2023)   FOOT EXAM  06/03/2024   Diabetic kidney evaluation - eGFR measurement  10/06/2024   Medicare Annual Wellness (AWV)  12/09/2024   DTaP/Tdap/Td (2 - Td or Tdap) 07/12/2031   Hepatitis C Screening  Completed   HPV VACCINES   Aged Out    Health Maintenance  Health Maintenance Due  Topic Date Due   Zoster Vaccines- Shingrix (1 of 2) Never done   Colonoscopy  Never done   DEXA SCAN  Never done   Pneumonia Vaccine 35+ Years old (2 of 2 - PCV) 05/04/2022   MAMMOGRAM  05/12/2023   COVID-19 Vaccine (5 - 2024-25 season) 07/21/2023   OPHTHALMOLOGY EXAM  10/09/2023   HEMOGLOBIN A1C  12/05/2023   Diabetic kidney evaluation - Urine ACR  12/15/2023    Colorectal cancer screening: said insurance won't pay yet   Mammogram status: Completed 05/11/2021. Repeat every 2 years  Bone Density status: scheduled for 12/2023 per patient  Lung Cancer Screening: (Low Dose CT Chest recommended if Age 73-80 years, 20 pack-year currently smoking OR have quit w/in 15years.) does not qualify.   Lung Cancer Screening Referral: no  Additional Screening:  Hepatitis C Screening: does qualify; Completed 07/02/2022  Vision Screening: Recommended annual ophthalmology exams for early detection of glaucoma and other disorders of the eye. Is the patient up to date with their annual eye exam?  Yes  Who is the provider or what is the name of the office in which the patient attends annual eye exams? [facility] If pt is not established with a provider, would they like to be referred to a provider to establish care? No .   Dental Screening: Recommended annual dental exams for proper oral hygiene  Diabetic Foot Exam: Diabetic Foot Exam: Completed 06/04/2023  Community Resource Referral / Chronic Care Management: CRR required this visit?  No   CCM required this visit?  No     Plan:     I have personally reviewed and noted the following in the patient's chart:   Medical and social history Use of alcohol, tobacco or illicit drugs  Current medications and supplements including opioid prescriptions. Patient is not currently taking opioid prescriptions. Functional ability and status Nutritional status Physical activity Advanced  directives List of other physicians Hospitalizations, surgeries, and ER visits in previous 12 months Vitals Screenings to include cognitive, depression, and falls Referrals and appointments  In addition, I have reviewed and discussed with patient certain preventive protocols, quality metrics, and best practice recommendations. A written personalized care plan for preventive services as well as general preventive health recommendations were provided to patient.     Barb Merino, LPN   [id number]   After Visit Summary: (Pick Up) Due to this being a telephonic visit, with patients personalized plan was offered to patient and patient has requested to Pick up at office.  Nurse Notes: none

## 2023-12-16 ENCOUNTER — Ambulatory Visit: Payer: Medicare Other | Admitting: Medical

## 2023-12-16 VITALS — BP 138/80 | HR 80 | Wt 167.6 lb

## 2023-12-16 DIAGNOSIS — N1831 Chronic kidney disease, stage 3a: Secondary | ICD-10-CM | POA: Diagnosis not present

## 2023-12-16 DIAGNOSIS — E113591 Type 2 diabetes mellitus with proliferative diabetic retinopathy without macular edema, right eye: Secondary | ICD-10-CM | POA: Diagnosis not present

## 2023-12-16 DIAGNOSIS — Z23 Encounter for immunization: Secondary | ICD-10-CM

## 2023-12-16 DIAGNOSIS — E1169 Type 2 diabetes mellitus with other specified complication: Secondary | ICD-10-CM

## 2023-12-16 DIAGNOSIS — I10 Essential (primary) hypertension: Secondary | ICD-10-CM | POA: Diagnosis not present

## 2023-12-16 DIAGNOSIS — Z1211 Encounter for screening for malignant neoplasm of colon: Secondary | ICD-10-CM

## 2023-12-16 DIAGNOSIS — Z1231 Encounter for screening mammogram for malignant neoplasm of breast: Secondary | ICD-10-CM

## 2023-12-16 DIAGNOSIS — E1101 Type 2 diabetes mellitus with hyperosmolarity with coma: Secondary | ICD-10-CM

## 2023-12-16 DIAGNOSIS — Z780 Asymptomatic menopausal state: Secondary | ICD-10-CM | POA: Diagnosis not present

## 2023-12-16 DIAGNOSIS — Z87891 Personal history of nicotine dependence: Secondary | ICD-10-CM

## 2023-12-16 DIAGNOSIS — E785 Hyperlipidemia, unspecified: Secondary | ICD-10-CM

## 2023-12-16 DIAGNOSIS — E113592 Type 2 diabetes mellitus with proliferative diabetic retinopathy without macular edema, left eye: Secondary | ICD-10-CM

## 2023-12-16 DIAGNOSIS — Z794 Long term (current) use of insulin: Secondary | ICD-10-CM | POA: Diagnosis not present

## 2023-12-16 NOTE — Progress Notes (Signed)
Subjective:  Maria Foster is a 68 y.o. female who presents for Chief Complaint  Patient presents with   Medical Management of Chronic Issues    Follow-up on Diabetes, no concerns, eye exam- [facility]     Here for med check and chronic disease follow-up  Has been feeling fine, particular since stopping smoking cigarettes.  Here for diabetes follow-up.  Blood sugars usually run pretty good at home.  She is compliant with Tradjenta.  Although she is supposed to be taking glipizide she does not think she is taking this.  She does not recall that medication.  Hyperlipidemia-compliant with her cholesterol medication and aspirin  History of heart failure, hypertension-compliant with medications  She is scheduled soon for mammogram and bone density test  She is having some dental work done soon and does not want to pursue colonoscopy right yet.  She plans to do it later this year  no other aggravating or relieving factors.    No other c/o.  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   OU progressive, will medically need to have cataract surgery with lens will implantation so as to allow for complete clearance of the vitreous cavity in anticipation of likely vitrectomy in the future for progressive PDR despite previous PRP as delineated by the E DRS series of reports   Diabetes mellitus type 2 with complications (HCC)    diagnosed probably 2012 per patient   Diabetic retinopathy (HCC)    Hypertension    Rheumatoid arthritis (HCC)    Septic olecranon bursitis of right elbow 07/11/2021   Current Outpatient Medications on File Prior to Visit  Medication Sig Dispense Refill   aspirin EC 81 MG tablet Take 81 mg by mouth daily.     atorvastatin (LIPITOR) 40 MG tablet TAKE 1 TABLET(40 MG) BY MOUTH DAILY 90 tablet 3   carvedilol (COREG) 25 MG tablet Take 1 tablet (25 mg total) by mouth 2 (two) times daily. 180 tablet 3    cloNIDine (CATAPRES) 0.1 MG tablet Take 1 tablet (0.1 mg total) by mouth 2 (two) times daily. 60 tablet 11   ENTRESTO 97-103 MG TAKE 1 TABLET BY MOUTH TWICE DAILY 60 tablet 6   ferrous gluconate (FERGON) 324 MG tablet Take 1 tablet (324 mg total) by mouth daily with breakfast. 90 tablet 3   furosemide (LASIX) 40 MG tablet Take 1 tablet (40 mg total) by mouth daily. 90 tablet 3   gentamicin cream (GARAMYCIN) 0.1 % Apply to left 5th toe once daily. 15 g 0   glipiZIDE (GLUCOTROL) 5 MG tablet TAKE 1 TABLET(5 MG) BY MOUTH TWICE DAILY 180 tablet 0   hydrALAZINE (APRESOLINE) 100 MG tablet Take 1 tablet (100 mg total) by mouth 3 (three) times daily. 200 tablet 3   isosorbide mononitrate (IMDUR) 60 MG 24 hr tablet Take 1 tablet (60 mg total) by mouth daily. 30 tablet 11   latanoprost (XALATAN) 0.005 % ophthalmic solution Place 1 drop into both eyes at bedtime.     leflunomide (ARAVA) 20 MG tablet Take 1 tablet (20 mg total) by mouth daily. 90 tablet 0   linagliptin (TRADJENTA) 5 MG TABS tablet TAKE 1 TABLET(5 MG) BY MOUTH DAILY 30 tablet 5   metoprolol succinate (TOPROL-XL) 50 MG 24 hr tablet      spironolactone (ALDACTONE) 50 MG tablet Take 1 tablet (50 mg total) by mouth daily. 30 tablet 6  acetaminophen (TYLENOL) 325 MG tablet Take 1-2 tablets (325-650 mg total) by mouth every 6 (six) hours as needed for mild pain (pain score 1-3 or temp > 100.5). 20 tablet 0   ondansetron (ZOFRAN) 4 MG tablet Take 1 tablet (4 mg total) by mouth every 8 (eight) hours as needed for nausea or vomiting. (Patient not taking: Reported on 12/16/2023) 20 tablet 0   No current facility-administered medications on file prior to visit.     The following portions of the patient's history were reviewed and updated as appropriate: allergies, current medications, past family history, past medical history, past social history, past surgical history and problem list.  ROS Otherwise as in subjective above    Objective: BP  138/80   Pulse 80   Wt 167 lb 9.6 oz (76 kg)   SpO2 98%   BMI 29.69 kg/m   BP Readings from Last 3 Encounters:  12/16/23 138/80  10/07/23 (!) 173/74  07/15/23 (!) 148/76   Wt Readings from Last 3 Encounters:  12/16/23 167 lb 9.6 oz (76 kg)  10/07/23 164 lb (74.4 kg)  07/09/23 159 lb (72.1 kg)    General appearance: alert, no distress, well developed, well nourished Neck: supple, no lymphadenopathy, no thyromegaly, no masses Heart: RRR, normal S1, S2, no murmurs Lungs: CTA bilaterally, no wheezes, rhonchi, or rales Pulses: 2+ radial pulses, 2+ pedal pulses, normal cap refill Ext: no edema      Assessment: Encounter Diagnoses  Name Primary?   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  She does not think she is actually taking glipizide.  Continue glucose monitoring and routine follow-up  Heart failure, hypertension-managed by cardiology and sees Pharm.D. as well.  Continue current medications  Hyperlipidemia-continue atorvastatin 40 mg daily and aspirin 81 mg daily  Vaccine counseling I recommend you have a shingles vaccine at your pharmacy and make sure we are copied on the documentation  Counseled on the  pneumococcal vaccine.  Vaccine information sheet given.  Pneumococcal vaccine Prevnar 20 given after consent obtained.  Go as scheduled same for mammogram and bone density  She will hold off on colonoscopy temporarily but we did discuss doing this sometime in the next 6 months   Roslyn was seen today for medical management of chronic issues.  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

## 2023-12-16 NOTE — Addendum Note (Signed)
Addended by: Herminio Commons A on: 12/16/2023 12:04 PM   Modules accepted: Orders

## 2023-12-17 ENCOUNTER — Other Ambulatory Visit: Payer: Self-pay | Admitting: Medical

## 2023-12-17 DIAGNOSIS — Z780 Asymptomatic menopausal state: Secondary | ICD-10-CM

## 2023-12-17 LAB — HEMOGLOBIN A1C
Est. average glucose Bld gHb Est-mCnc: 177 mg/dL
Hgb A1c MFr Bld: 7.8 % — ABNORMAL HIGH (ref 4.8–5.6)

## 2023-12-17 LAB — MICROALBUMIN / CREATININE URINE RATIO
Creatinine, Urine: 41.4 mg/dL
Microalb/Creat Ratio: 36 mg/g{creat} — ABNORMAL HIGH (ref 0–29)
Microalbumin, Urine: 15 ug/mL

## 2023-12-17 LAB — LIPID PANEL
Chol/HDL Ratio: 2.6 {ratio} (ref 0.0–4.4)
Cholesterol, Total: 124 mg/dL (ref 100–199)
HDL: 48 mg/dL (ref >39)
LDL Chol Calc (NIH): 63 mg/dL (ref 0–99)
Triglycerides: 57 mg/dL (ref 0–149)
VLDL Cholesterol Cal: 13 mg/dL (ref 5–40)

## 2023-12-17 MED ORDER — LINAGLIPTIN 5 MG PO TABS: 5.0000 mg | ORAL_TABLET | Freq: Every day | ORAL | 1 refills | Status: DC

## 2023-12-17 MED ORDER — GLIPIZIDE 5 MG PO TABS: 5.0000 mg | ORAL_TABLET | Freq: Two times a day (BID) | ORAL | 1 refills | Status: DC

## 2023-12-17 NOTE — Progress Notes (Signed)
Diabetes marker not at goal, still at 7.8%  Have her look at her medications at home and verify her diabetes medicines.  Although glipizide had been added in the past she did not think she was taking glipizide as of yesterday.  So I assume she is only taking the Tradjenta.  If that is the case then I want to add glipizide which is a tablet taken twice daily to help control blood sugars  Continue rest of medicines as usual.  Limit high carb meals, avoid sugary drinks and junk food.  Eat 2-3 fruit servings a day, eat vegetables every meal, try to get exercise regularly  Plan 16-month follow-up on diabetes  Let me know about the above

## 2023-12-24 ENCOUNTER — Encounter: Payer: Self-pay | Admitting: Internal Medicine

## 2023-12-25 NOTE — Progress Notes (Deleted)
 Office Visit Note  Patient: Maria Foster             Date of Birth: 1956/03/07           MRN: [id number]             PCP: Jac Canavan, PA-C Referring: Jac Canavan, PA-C Visit Date: 01/07/2024   Subjective:  No chief complaint on file.   History of Present Illness: Maria Foster is a 68 y.o. female here for follow up for seropositive nodular rheumatoid arthritis on leflunomide 20 mg p.o. daily.    Previous HPI 10/07/2023 Maria Foster is a 68 y.o. female here for follow up for seropositive nodular rheumatoid arthritis on leflunomide 20 mg p.o. daily.  Overall most of her symptoms are doing well and has not noticed either increase nor decrease in the nodules.  Current worst complaint is pain at her left index finger.  This hurts at rest and with use and she notices more frequently if the finger getting stuck and some decrease in ability to close her hand.  Not associated with any change in sensation or loss of grip strength.     Previous HPI 07/09/2023 Maria Foster is a 68 y.o. female here for follow up for seropositive nodular rheumatoid arthritis on leflunomide 20 mg p.o. daily.  She has been doing well without any major flareup without significant joint pain or swelling until the past week.  She did a a lot of cleaning around the house including scrubbing of kitchen back splash and afterwards developed increased pain and swelling in the right wrist.  So far she is not taken anything extra or tried using her wrist band that she sometimes wears in the past for pain relief.   Previous HPI 04/08/2023 Maria Foster is a 68 y.o. female here for follow up for seropositive nodular rheumatoid arthritis on leflunomide 20 mg p.o. daily.  Has not seen much additional change in the hand and elbow nodules.  Not having significant hand pain or stiffness most days.  She noticed some worsening in her knee pain during recent rainy weather but on other days has no symptoms.  She  feels she is noticing some gassiness or bloating with the leflunomide but not too severe and is still taking the medicine as directed.  She had recent labs checked in April which we reviewed her blood count is normal and metabolic panel shows estimated GFR of 51 which is stable for her baseline.   Previous HPI 12/24/22 Maria Foster is a 68 y.o. 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 daily.  Unfortunately had interruption with starting due to hospital admission for systemic inflammation starting from urinary tract infection.  Joint pain symptoms have not changed significantly.  She is having issue with ongoing nonproductive cough.  Also with frequent eye watering and some irritation no vision changes and no associated swelling.  She has upcoming evaluations for her lungs and congestive heart failure.   Previous HPI 09/26/22 Maria Foster is a 68 y.o. female here for follow up for seropositive RA we did not start methotrexate as planned due to renal impairment worse than baseline checked at clinic appointment.  Symptoms remain about the same and also not seeing any worsening with stopping hydroxychloroquine.   Previous HPI 07/02/2022 Maria Foster is a 68 y.o. 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  She feels  like symptoms have made no significant difference on this treatment.  No recurrence of severe flareups like she had in her elbow either.  She does notice a few new and expanding nodules with episodes of spontaneous rupturing and white fluid drainage. Does not have much joint pain or swelling outside of the nodules.   Previous HPI 01/01/2022 Maria Foster is a 68 y.o. female here for follow up for seropositive RA on HCQ 200 mg daily. After our last visit with right elbow injection she developed worsening pain and then purulent drainage found to be MSSA infection of that right olecranon bursa and went to the hospital  for surgical debridement of the wound. Wound culture was obtained also surgical report of rheumatoid nodule at the site. She took a course of antibiotics with PICC line completed without incident. She resumed hydroxychloroquine after this and she feels her joint pain is very well controlled at this time. She notices some increase in stiffness and pain with cold and changing weather.   Previous HPI 07/03/21 Maria Foster is a 68 y.o. female here for follow up for seropositive RA and chronic tophaceous gout. Recommended starting hydroxychloroquine treatment 200 mg PO daily for suspected RA as active disease and normal uric levels. She feels her symptoms are improved since starting this, particularly states pain with weather and temperature changes are decreased. She has right elbow pain more than elsewhere says this hurts mostly due to pressure on the site when lying in bed or certain seated positions.   Previous HPI: 03/29/21 Maria Foster is a 68 y.o. female referred here for evaluation of rheumatoid arthritis.  She has a longstanding history of bilateral hand and knee pain that has been going on for years with some progressive worsening and deformity over time.  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  She also has chronic knee pain and stiffness bilaterally without any large nodular changes.  She does not have much joint pain most days however experiences some episodically and also feels that her hands and knees hurt worse with cold and rainy weather.  She describes a history of rheumatoid arthritis but has never been on treatment plans for this.  She states she has never had any episode of gout involving the toes or feet but has been told by her cardiologist this is a problem that she has.   She does not recall  ever undergoing bone density screening for osteoporosis. She has a long smoking history but in recent months is either not smoking or very reduced usage most days.   No Rheumatology ROS completed.   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 type 2 diabetes mellitus (HCC) 11/16/2021   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 2 diabetes mellitus (HCC) 11/16/2021   OSA (obstructive sleep apnea) 08/25/2021   Iron deficiency 08/25/2021  Screening for cancer 08/25/2021   Influenza vaccination declined 08/25/2021   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   Stage 3a chronic kidney disease (HCC) 07/11/2021   Essential hypertension, benign 05/04/2021   Hyperlipidemia associated with type 2 diabetes mellitus (HCC) 05/04/2021   Enlarged and hypertrophic nails 05/04/2021   Encounter for screening mammogram for malignant neoplasm of breast 05/04/2021   Need for pneumococcal vaccination 05/04/2021   CHF (congestive heart failure) (HCC) 05/04/2021   Stable angina (HCC) 05/04/2021   Vaccine counseling 05/04/2021   High risk medication use 03/29/2021   Screen for colon cancer 01/27/2021   Tophi 01/11/2021   Rheumatoid arthritis involving both hands (HCC) 01/11/2021   Elevated troponin    Type 2 diabetes mellitus (HCC) 12/19/2020   Constipation 12/19/2020    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   OU progressive, will  medically need to have cataract surgery with lens will implantation so as to allow for complete clearance of the vitreous cavity in anticipation of likely vitrectomy in the future for progressive PDR despite previous PRP as delineated by the E DRS series of reports   Diabetes mellitus type 2 with complications (HCC)    diagnosed probably 2012 per patient   Diabetic retinopathy (HCC)    Hypertension    Rheumatoid arthritis (HCC)    Septic olecranon bursitis of right elbow 07/11/2021    Family History  Problem Relation Age of Onset   Heart attack Mother    Heart failure Mother    Diabetes Mother    Hypertension Mother    Heart failure Sister    Heart Problems Sister    Heart failure Sister    Hypertension Daughter    Amblyopia Neg Hx    Blindness Neg Hx    Cataracts Neg Hx    Glaucoma Neg Hx    Macular degeneration Neg Hx    Retinal detachment Neg Hx    Strabismus Neg Hx    Retinitis pigmentosa Neg Hx    Past Surgical History:  Procedure Laterality Date   INCISION AND DRAINAGE ABSCESS N/A 07/11/2021   Procedure: INCISION AND DRAINAGE RIGHT ELBOW;  Surgeon: Durene Romans, MD;  Location: [facility];  Service: Orthopedics;  Laterality: N/A;   PRP Bilateral OD - 07/23/2017 OS - 08/06/2017   RIGHT/LEFT HEART CATH AND CORONARY ANGIOGRAPHY N/A 01/05/2021   Procedure: RIGHT/LEFT HEART CATH AND CORONARY ANGIOGRAPHY;  Surgeon: Dolores Patty, MD;  Location: [facility];  Service: Cardiovascular;  Laterality: N/A;   TEE WITHOUT CARDIOVERSION N/A 10/09/2021   Procedure: TRANSESOPHAGEAL ECHOCARDIOGRAM (TEE);  Surgeon: Dolores Patty, MD;  Location: [facility] ENDOSCOPY;  Service: Cardiovascular;  Laterality: N/A;   Social History   Social History Narrative   Not on file   Immunization History  Administered Date(s) Administered   PFIZER(Purple Top)SARS-COV-2 Vaccination 02/09/2020, 03/01/2020, 11/05/2020   PNEUMOCOCCAL CONJUGATE-20 12/16/2023   Pfizer(Comirnaty)Fall Seasonal Vaccine  12 years and older 12/14/2022   Pneumococcal Polysaccharide-23 05/04/2021   Tdap 07/11/2021     Objective: Vital Signs: There were no vitals taken for this visit.   Physical Exam   Musculoskeletal Exam: ***  CDAI Exam: CDAI Score: -- Patient Global: --; Provider Global: -- Swollen: --; Tender: -- Joint Exam 01/07/2024   No joint exam has been documented for this visit   There is currently no information documented on the homunculus. Go to the Rheumatology activity and complete the homunculus joint  exam.  Investigation: No additional findings.  Imaging: No results found.  Recent Labs: Lab Results  Component Value Date   WBC 8.2 10/07/2023   HGB 11.9 10/07/2023   PLT 171 10/07/2023   NA 139 10/07/2023   K 4.0 10/07/2023   CL 105 10/07/2023   CO2 26 10/07/2023   GLUCOSE 223 (H) 10/07/2023   BUN 20 10/07/2023   CREATININE 1.13 (H) 10/07/2023   BILITOT 0.7 10/07/2023   ALKPHOS 85 10/30/2022   AST 11 10/07/2023   ALT 8 10/07/2023   PROT 7.7 10/07/2023   ALBUMIN 3.4 (L) 10/30/2022   CALCIUM 9.4 10/07/2023   GFRAA >60 11/29/2016    Speciality Comments: No specialty comments available.  Procedures:  No procedures performed Allergies: Metformin and related and Farxiga [dapagliflozin]   Assessment / Plan:     Visit Diagnoses: No diagnosis found.  ***  Orders: No orders of the defined types were placed in this encounter.  No orders of the defined types were placed in this encounter.    Follow-Up Instructions: No follow-ups on file.   Metta Clines, RT  Note - This record has been created using [employer].  Chart creation errors have been sought, but may not always  have been located. Such creation errors do not reflect on  the standard of medical care.

## 2024-01-07 ENCOUNTER — Ambulatory Visit: Payer: Medicare Other | Admitting: Internal Medicine

## 2024-01-07 DIAGNOSIS — M79645 Pain in left finger(s): Secondary | ICD-10-CM

## 2024-01-07 DIAGNOSIS — I10 Essential (primary) hypertension: Secondary | ICD-10-CM

## 2024-01-07 DIAGNOSIS — Z79899 Other long term (current) drug therapy: Secondary | ICD-10-CM

## 2024-01-07 DIAGNOSIS — M05741 Rheumatoid arthritis with rheumatoid factor of right hand without organ or systems involvement: Secondary | ICD-10-CM

## 2024-01-10 ENCOUNTER — Other Ambulatory Visit: Payer: Self-pay | Admitting: *Deleted

## 2024-01-10 DIAGNOSIS — I739 Peripheral vascular disease, unspecified: Secondary | ICD-10-CM

## 2024-01-16 ENCOUNTER — Ambulatory Visit: Payer: Medicare Other

## 2024-01-20 NOTE — Progress Notes (Unsigned)
 [facility] [place]  ASSESSMENT / PLAN: Maria Foster is a 68 y.o. female with atherosclerosis of native arteries of bilateral lower extremities causing no symptoms.  Patient counseled patients with asymptomatic peripheral arterial disease or claudication have a 1-2% risk of developing chronic limb threatening ischemia, but a 15-30% risk of mortality in the next 5 years. Intervention should only be considered for medically optimized patients with disabling symptoms.   Recommend the following which can slow the progression of atherosclerosis and reduce the risk of major adverse cardiac / limb events:  Complete cessation from all tobacco products. Blood glucose control with goal A1c < 7%. Blood pressure control with goal blood pressure < 140/90 mmHg. Lipid reduction therapy with goal LDL-C <100 mg/dL (<16 if symptomatic from PAD).  Aspirin 81mg  PO QD.  Atorvastatin 40-80mg  PO QD (or other "high intensity" statin therapy).  Patient counseled about symptoms of peripheral arterial disease.  She was instructed to return to care should any of these develop.  Otherwise I will see her in 1 year with repeat ankle-brachial index.   CHIEF COMPLAINT: Asymptomatic peripheral arterial disease  HISTORY OF PRESENT ILLNESS: Maria Foster is a 68 y.o. female referred to clinic for evaluation of peripheral arterial disease.  Patient is asymptomatic from a peripheral arterial disease standpoint.  She reports she got a annual physical exam, which was worrisome for possible peripheral arterial disease, prompting noninvasive testing referral.  The patient reports she can walk as much as she likes.  She does not have any symptoms of claudication.  She has no symptoms of ischemic rest pain.  She has no ulcers about her feet.  She is a former smoker.   01/21/24: Patient returns for surveillance.  She is doing well overall.  She reports no symptoms of claudication, ischemic rest pain, or  ulceration.  She prefers annual surveillance.  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   OU progressive, will medically need to have cataract surgery with lens will implantation so as to allow for complete clearance of the vitreous cavity in anticipation of likely vitrectomy in the future for progressive PDR despite previous PRP as delineated by the E DRS series of reports   Diabetes mellitus type 2 with complications (HCC)    diagnosed probably 2012 per patient   Diabetic retinopathy (HCC)    Hypertension    Rheumatoid arthritis (HCC)    Septic olecranon bursitis of right elbow 07/11/2021    Past Surgical History:  Procedure Laterality Date   INCISION AND DRAINAGE ABSCESS N/A 07/11/2021   Procedure: INCISION AND DRAINAGE RIGHT ELBOW;  Surgeon: Durene Romans, MD;  Location: [facility];  Service: Orthopedics;  Laterality: N/A;   PRP Bilateral OD - 07/23/2017 OS - 08/06/2017   RIGHT/LEFT HEART CATH AND CORONARY ANGIOGRAPHY N/A 01/05/2021   Procedure: RIGHT/LEFT HEART CATH AND CORONARY ANGIOGRAPHY;  Surgeon: Dolores Patty, MD;  Location: [facility];  Service: Cardiovascular;  Laterality: N/A;   TEE WITHOUT CARDIOVERSION N/A 10/09/2021   Procedure: TRANSESOPHAGEAL ECHOCARDIOGRAM (TEE);  Surgeon: Dolores Patty, MD;  Location: [facility] ENDOSCOPY;  Service: Cardiovascular;  Laterality: N/A;    Family History  Problem Relation Age of Onset   Heart attack Mother    Heart failure Mother    Diabetes Mother    Hypertension Mother    Heart failure Sister    Heart Problems Sister  Heart failure Sister    Hypertension Daughter    Amblyopia Neg Hx    Blindness Neg Hx    Cataracts Neg Hx    Glaucoma Neg Hx    Macular degeneration Neg Hx    Retinal detachment Neg Hx    Strabismus Neg Hx    Retinitis pigmentosa Neg Hx     Social History   Socioeconomic History   Marital status: Married    Spouse name:  Not on file   Number of children: Not on file   Years of education: Not on file   Highest education level: Not on file  Occupational History   Occupation: retired  Tobacco Use   Smoking status: Former    Current packs/day: 0.00    Average packs/day: 0.5 packs/day for 15.0 years (7.5 ttl pk-yrs)    Types: Cigarettes    Start date: 09/2007    Quit date: 09/2022    Years since quitting: 1.3    Passive exposure: Past   Smokeless tobacco: Never   Tobacco comments:    Quit 10/28/2022  Vaping Use   Vaping status: Never Used  Substance and Sexual Activity   Alcohol use: Not Currently   Drug use: No   Sexual activity: Not on file  Other Topics Concern   Not on file  Social History Narrative   Not on file   Social Drivers of Health   Financial Resource Strain: Low Risk  (12/10/2023)   Overall Financial Resource Strain (CARDIA)    Difficulty of Paying Living Expenses: Not hard at all  Food Insecurity: No Food Insecurity (12/10/2023)   Hunger Vital Sign    Worried About Running Out of Food in the Last Year: Never true    Ran Out of Food in the Last Year: Never true  Transportation Needs: No Transportation Needs (12/10/2023)   PRAPARE - Administrator, Civil Service (Medical): No    Lack of Transportation (Non-Medical): No  Physical Activity: Insufficiently Active (12/10/2023)   Exercise Vital Sign    Days of Exercise per Week: 7 days    Minutes of Exercise per Session: 20 min  Stress: No Stress Concern Present (12/10/2023)   [employer] of Occupational Health - Occupational Stress Questionnaire    Feeling of Stress : Not at all  Social Connections: Moderately Isolated (12/10/2023)   Social Connection and Isolation Panel [NHANES]    Frequency of Communication with Friends and Family: More than three times a week    Frequency of Social Gatherings with Friends and Family: Once a week    Attends Religious Services: Never    Database administrator or Organizations: No     Attends Banker Meetings: Never    Marital Status: Married  Catering manager Violence: Not At Risk (12/10/2023)   Humiliation, Afraid, Rape, and Kick questionnaire    Fear of Current or Ex-Partner: No    Emotionally Abused: No    Physically Abused: No    Sexually Abused: No    Allergies  Allergen Reactions   Metformin And Related Other (See Comments)    Kidney injury, GI upset, diarrhea   Farxiga [Dapagliflozin] Other (See Comments)    Yeast infections, recurrent    Current Outpatient Medications  Medication Sig Dispense Refill   acetaminophen (TYLENOL) 325 MG tablet Take 1-2 tablets (325-650 mg total) by mouth every 6 (six) hours as needed for mild pain (pain score 1-3 or temp > 100.5). 20 tablet 0   aspirin  EC 81 MG tablet Take 81 mg by mouth daily.     atorvastatin (LIPITOR) 40 MG tablet TAKE 1 TABLET(40 MG) BY MOUTH DAILY 90 tablet 3   carvedilol (COREG) 25 MG tablet Take 1 tablet (25 mg total) by mouth 2 (two) times daily. 180 tablet 3   cloNIDine (CATAPRES) 0.1 MG tablet Take 1 tablet (0.1 mg total) by mouth 2 (two) times daily. 60 tablet 11   ENTRESTO 97-103 MG TAKE 1 TABLET BY MOUTH TWICE DAILY 60 tablet 6   ferrous gluconate (FERGON) 324 MG tablet Take 1 tablet (324 mg total) by mouth daily with breakfast. 90 tablet 3   furosemide (LASIX) 40 MG tablet Take 1 tablet (40 mg total) by mouth daily. 90 tablet 3   gentamicin cream (GARAMYCIN) 0.1 % Apply to left 5th toe once daily. 15 g 0   glipiZIDE (GLUCOTROL) 5 MG tablet Take 1 tablet (5 mg total) by mouth 2 (two) times daily before a meal. 180 tablet 1   hydrALAZINE (APRESOLINE) 100 MG tablet Take 1 tablet (100 mg total) by mouth 3 (three) times daily. 200 tablet 3   isosorbide mononitrate (IMDUR) 60 MG 24 hr tablet Take 1 tablet (60 mg total) by mouth daily. 30 tablet 11   latanoprost (XALATAN) 0.005 % ophthalmic solution Place 1 drop into both eyes at bedtime.     leflunomide (ARAVA) 20 MG tablet Take 1  tablet (20 mg total) by mouth daily. 90 tablet 0   linagliptin (TRADJENTA) 5 MG TABS tablet Take 1 tablet (5 mg total) by mouth daily. 90 tablet 1   metoprolol succinate (TOPROL-XL) 50 MG 24 hr tablet      ondansetron (ZOFRAN) 4 MG tablet Take 1 tablet (4 mg total) by mouth every 8 (eight) hours as needed for nausea or vomiting. 20 tablet 0   spironolactone (ALDACTONE) 50 MG tablet Take 1 tablet (50 mg total) by mouth daily. 30 tablet 6   No current facility-administered medications for this visit.    PHYSICAL EXAM Vitals:   01/21/24 1326  BP: (!) 227/114  Pulse: 93  Temp: 97.9 F (36.6 C)  TempSrc: Temporal  SpO2: 95%  Weight: 165 lb (74.8 kg)  Height: 5\' 3"  (1.6 m)     Middle aged woman in no acute distress Regular rate and rhythm Unlabored breathing No palpable pedal pulses No ulcers about the feet    PERTINENT LABORATORY AND RADIOLOGIC DATA  Most recent CBC    Latest Ref Rng & Units 10/07/2023    3:44 PM 07/09/2023    3:29 PM 02/28/2023    3:23 PM  CBC  WBC 3.8 - 10.8 Thousand/uL 8.2  7.3  6.8   Hemoglobin 11.7 - 15.5 g/dL 09.8  11.9  14.7   Hematocrit 35.0 - 45.0 % 38.2  38.1  42.4   Platelets 140 - 400 Thousand/uL 171  187  181      Most recent CMP    Latest Ref Rng & Units 10/07/2023    3:44 PM 07/09/2023    3:29 PM 02/28/2023    3:23 PM  CMP  Glucose 65 - 99 mg/dL 829  562  130   BUN 7 - 25 mg/dL 20  27  24    Creatinine 0.50 - 1.05 mg/dL 8.65  7.84  6.96   Sodium 135 - 146 mmol/L 139  137  139   Potassium 3.5 - 5.3 mmol/L 4.0  4.2  4.3   Chloride 98 - 110 mmol/L 105  103  105   CO2 20 - 32 mmol/L 26  26  25    Calcium 8.6 - 10.4 mg/dL 9.4  9.5  9.7   Total Protein 6.1 - 8.1 g/dL 7.7  7.4  7.9   Total Bilirubin 0.2 - 1.2 mg/dL 0.7  0.4  0.5   AST 10 - 35 U/L 11  11  15    ALT 6 - 29 U/L 8  11  12      Renal function CrCl cannot be calculated (Patient's most recent lab result is older than the maximum 21 days allowed.).  Hgb A1c MFr Bld (%)  Date  Value  12/16/2023 7.8 (H)    LDL Chol Calc (NIH)  Date Value Ref Range Status  12/16/2023 63 0 - 99 mg/dL Final    +-------+-----------+-----------+------------+------------+  ABI/TBIToday's ABIToday's TBIPrevious ABIPrevious TBI  +-------+-----------+-----------+------------+------------+  Right 0.73       0.49       0.55        0.29          +-------+-----------+-----------+------------+------------+  Left  0.62       0.45       0.52        0.25          +-------+-----------+-----------+------------+------------+     Rande Brunt. Lenell Antu, MD FACS [facility] Phone Number: [phone] 01/21/2024 3:32 PM   Total time spent on preparing this encounter including chart review, data review, collecting history, examining the patient, coordinating care for this established patient, 20 minutes  Portions of this report may have been transcribed using voice recognition software.  Every effort has been made to ensure accuracy; however, inadvertent computerized transcription errors may still be present.

## 2024-01-21 ENCOUNTER — Ambulatory Visit: Payer: Medicare Other | Admitting: Vascular Surgery

## 2024-01-21 ENCOUNTER — Ambulatory Visit (HOSPITAL_COMMUNITY)
Admission: RE | Admit: 2024-01-21 | Discharge: 2024-01-21 | Disposition: A | Discharge status: Home / Self Care | Payer: Medicare Other | Source: Ambulatory Visit | Attending: Vascular Surgery | Admitting: Vascular Surgery

## 2024-01-21 ENCOUNTER — Encounter: Payer: Self-pay | Admitting: Vascular Surgery

## 2024-01-21 VITALS — BP 227/114 | HR 93 | Temp 97.9°F | Ht 63.0 in | Wt 165.0 lb

## 2024-01-21 DIAGNOSIS — I739 Peripheral vascular disease, unspecified: Secondary | ICD-10-CM | POA: Insufficient documentation

## 2024-01-21 LAB — VAS US ABI WITH/WO TBI
Left ABI: 0.62
Right ABI: 0.73

## 2024-01-30 ENCOUNTER — Other Ambulatory Visit: Payer: Self-pay | Admitting: *Deleted

## 2024-01-30 DIAGNOSIS — I739 Peripheral vascular disease, unspecified: Secondary | ICD-10-CM

## 2024-02-11 ENCOUNTER — Ambulatory Visit (INDEPENDENT_AMBULATORY_CARE_PROVIDER_SITE_OTHER): Admitting: Medical

## 2024-02-11 VITALS — BP 124/70 | HR 106 | Wt 167.0 lb

## 2024-02-11 DIAGNOSIS — E1101 Type 2 diabetes mellitus with hyperosmolarity with coma: Secondary | ICD-10-CM

## 2024-02-11 DIAGNOSIS — Z794 Long term (current) use of insulin: Secondary | ICD-10-CM | POA: Diagnosis not present

## 2024-02-11 DIAGNOSIS — I10 Essential (primary) hypertension: Secondary | ICD-10-CM

## 2024-02-11 DIAGNOSIS — E785 Hyperlipidemia, unspecified: Secondary | ICD-10-CM | POA: Diagnosis not present

## 2024-02-11 DIAGNOSIS — Z79899 Other long term (current) drug therapy: Secondary | ICD-10-CM | POA: Diagnosis not present

## 2024-02-11 DIAGNOSIS — M05741 Rheumatoid arthritis with rheumatoid factor of right hand without organ or systems involvement: Secondary | ICD-10-CM

## 2024-02-11 DIAGNOSIS — M05742 Rheumatoid arthritis with rheumatoid factor of left hand without organ or systems involvement: Secondary | ICD-10-CM | POA: Diagnosis not present

## 2024-02-11 DIAGNOSIS — E1169 Type 2 diabetes mellitus with other specified complication: Secondary | ICD-10-CM | POA: Diagnosis not present

## 2024-02-11 NOTE — Progress Notes (Signed)
 Subjective:  Maria Foster is a 68 y.o. female who presents for Chief Complaint  Patient presents with   Blood Pressure Check    Bp check     Here for med check and chronic disease follow-up  She notes compliance with her blood pressure medicines listed.  She does not have her blood pressure pills with her today.  She does not think she is taking clonidine the this was a medical history prior to last visit that made her feel drunk.  She does not take Lasix every day but as needed.  Last visit we added glipizide to her Tradjenta.  She is doing well with this.  She does feel hungry a lot better.  She notes her blood sugars have been improved less than 130 fasting.  No low readings.  She eats 3 times a day.  Hyperlipidemia-compliant with her cholesterol medication and aspirin  She is having some dental work done soon and does not want to pursue colonoscopy right yet.  She plans to do it later this year  She looks after her 41-year-old granddaughter while her daughter is in school for hairstylist.  Her daughter owns her own business.  no other aggravating or relieving factors.    No other c/o.  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   OU progressive, will medically need to have cataract surgery with lens will implantation so as to allow for complete clearance of the vitreous cavity in anticipation of likely vitrectomy in the future for progressive PDR despite previous PRP as delineated by the E DRS series of reports   Diabetes mellitus type 2 with complications (HCC)    diagnosed probably 2012 per patient   Diabetic retinopathy (HCC)    Hypertension    Rheumatoid arthritis (HCC)    Septic olecranon bursitis of right elbow 07/11/2021   Current Outpatient Medications on File Prior to Visit  Medication Sig Dispense Refill   acetaminophen (TYLENOL) 325 MG tablet Take 1-2 tablets (325-650 mg total) by mouth  every 6 (six) hours as needed for mild pain (pain score 1-3 or temp > 100.5). 20 tablet 0   aspirin EC 81 MG tablet Take 81 mg by mouth daily.     atorvastatin (LIPITOR) 40 MG tablet TAKE 1 TABLET(40 MG) BY MOUTH DAILY 90 tablet 3   carvedilol (COREG) 25 MG tablet Take 1 tablet (25 mg total) by mouth 2 (two) times daily. 180 tablet 3   ENTRESTO 97-103 MG TAKE 1 TABLET BY MOUTH TWICE DAILY 60 tablet 6   ferrous gluconate (FERGON) 324 MG tablet Take 1 tablet (324 mg total) by mouth daily with breakfast. 90 tablet 3   furosemide (LASIX) 40 MG tablet Take 1 tablet (40 mg total) by mouth daily. 90 tablet 3   glipiZIDE (GLUCOTROL) 5 MG tablet Take 1 tablet (5 mg total) by mouth 2 (two) times daily before a meal. 180 tablet 1   hydrALAZINE (APRESOLINE) 100 MG tablet Take 1 tablet (100 mg total) by mouth 3 (three) times daily. 200 tablet 3   isosorbide mononitrate (IMDUR) 60 MG 24 hr tablet Take 1 tablet (60 mg total) by mouth daily. 30 tablet 11   latanoprost (XALATAN) 0.005 % ophthalmic solution Place 1 drop into both eyes at bedtime.     leflunomide (ARAVA) 20 MG tablet Take 1 tablet (20 mg total) by mouth daily. 90 tablet 0   linagliptin (TRADJENTA)  5 MG TABS tablet Take 1 tablet (5 mg total) by mouth daily. 90 tablet 1   spironolactone (ALDACTONE) 50 MG tablet Take 1 tablet (50 mg total) by mouth daily. 30 tablet 6   cloNIDine (CATAPRES) 0.1 MG tablet Take 1 tablet (0.1 mg total) by mouth 2 (two) times daily. (Patient not taking: Reported on 02/11/2024) 60 tablet 11   No current facility-administered medications on file prior to visit.     The following portions of the patient's history were reviewed and updated as appropriate: allergies, current medications, past family history, past medical history, past social history, past surgical history and problem list.  ROS Otherwise as in subjective above    Objective: BP 124/70   Pulse (!) 106   Wt 167 lb (75.8 kg)   BMI 29.58 kg/m   BP  Readings from Last 3 Encounters:  02/11/24 124/70  01/21/24 (!) 227/114  12/16/23 138/80   Wt Readings from Last 3 Encounters:  02/11/24 167 lb (75.8 kg)  01/21/24 165 lb (74.8 kg)  12/16/23 167 lb 9.6 oz (76 kg)    General appearance: alert, no distress, well developed, well nourished Neck: supple, no lymphadenopathy, no thyromegaly, no masses Heart: RRR, normal S1, S2, no murmurs Lungs: CTA bilaterally, no wheezes, rhonchi, or rales Pulses: 2+ radial pulses, 2+ pedal pulses, normal cap refill Ext: no edema      Assessment: Encounter Diagnoses  Name Primary?   Essential hypertension, benign Yes   Hyperlipidemia associated with type 2 diabetes mellitus (HCC)    Type 2 diabetes mellitus with hyperosmolar coma, with long-term current use of insulin (HCC)    Rheumatoid arthritis involving both hands with positive rheumatoid factor (HCC)    High risk medication use      Plan: Diabetes Return for fasting labs in a few days on 02/14/24 or later Continue Tradjenta 5mg  daily Continue Glipizide 5mg  twice daily Continue glucose monitoring and routine follow-up  Heart failure, hypertension-managed by cardiology and sees Pharm.D. as well.  Continue current medications  High blood pressure is not controlled Check your blood pressures several times a week for the next few weeks and take your readings with you to cardiology soon Take your pill bottles when you go to cardiology as well. Based on our conversation I do not think you are taking clonidine currently due to feeling loopy and drunk on clonidine Your current regimen is as follows: Carvedilol 25 mg twice daily Hydralazine 100 mg 3 times a day Spironolactone 50 mg daily Entresto 97/103 mg twice daily You are currently taking Lasix as needed, but I recommend you change this to daily to help with your blood pressure as well   Hyperlipidemia-continue atorvastatin 40 mg daily and aspirin 81 mg daily  Go as scheduled same for  mammogram and bone density.  Call back and see if you can do either 1 of these.  There is a mammogram bus, and there are several locations including [place].  Maybe you can have your mammogram bone density test done sooner than fall 2025.  [facility]  [phone]   I wanted to remind you that you are past due for colonoscopy.   Lets plan to do this in late summer if possible.  You have deferred this prior    Maria Foster was seen today for blood pressure check.  Diagnoses and all orders for this visit:  Essential hypertension, benign  Hyperlipidemia associated with type 2 diabetes mellitus (HCC)  Type 2 diabetes mellitus  with hyperosmolar coma, with long-term current use of insulin (HCC) -     Hemoglobin A1c; Future -     Comprehensive metabolic panel; Future  Rheumatoid arthritis involving both hands with positive rheumatoid factor (HCC)  High risk medication use -     Hemoglobin A1c; Future -     Comprehensive metabolic panel; Future -     CBC; Future    Follow up: pending labs

## 2024-02-11 NOTE — Patient Instructions (Signed)
  Diabetes Return for fasting labs in a few days on 02/14/24 or later Continue Tradjenta 5mg  daily Continue Glipizide 5mg  twice daily Continue glucose monitoring and routine follow-up  Heart failure, hypertension-managed by cardiology and sees Pharm.D. as well.  Continue current medications  High blood pressure is not controlled Check your blood pressures several times a week for the next few weeks and take your readings with you to cardiology soon Take your pill bottles when you go to cardiology as well. Based on our conversation I do not think you are taking clonidine currently due to feeling loopy and drunk on clonidine Your current regimen is as follows: Carvedilol 25 mg twice daily Hydralazine 100 mg 3 times a day Spironolactone 50 mg daily Entresto 97/103 mg twice daily You are currently taking Lasix as needed, but I recommend you change this to daily to help with your blood pressure as well   Hyperlipidemia-continue atorvastatin 40 mg daily and aspirin 81 mg daily  Go as scheduled same for mammogram and bone density.  Call back and see if you can do either 1 of these.  There is a mammogram bus, and there are several locations including [place].  Maybe you can have your mammogram bone density test done sooner than fall 2025.  [facility]  [phone]   I wante to remind you that you are past due for colonoscopy.   Lets plan to do this in late summer if possible.  You have deferred this prior

## 2024-02-14 ENCOUNTER — Other Ambulatory Visit (HOSPITAL_COMMUNITY): Payer: Self-pay

## 2024-02-17 ENCOUNTER — Other Ambulatory Visit

## 2024-02-17 DIAGNOSIS — Z79899 Other long term (current) drug therapy: Secondary | ICD-10-CM | POA: Diagnosis not present

## 2024-02-17 DIAGNOSIS — E1101 Type 2 diabetes mellitus with hyperosmolarity with coma: Secondary | ICD-10-CM | POA: Diagnosis not present

## 2024-02-17 DIAGNOSIS — Z794 Long term (current) use of insulin: Secondary | ICD-10-CM | POA: Diagnosis not present

## 2024-02-17 NOTE — Progress Notes (Signed)
 Office Visit Note  Patient: Maria Foster             Date of Birth: [birth year]           MRN: [id number]             PCP: Claudene Crystal, PA-C Referring: Claudene Crystal, PA-C Visit Date: 03/02/2024   Subjective:  Follow-up   Discussed the use of AI scribe software for clinical note transcription with the patient, who gave verbal consent to proceed.  History of Present Illness   Maria Foster is a 68 y.o. female here for follow up for seropositive nodular rheumatoid arthritis on leflunomide  20 mg p.o. daily.    She experiences a stinging sensation in a red area on her skin, which acts up intermittently and began bothering her last night. She speculates that it might be related to something she ate, specifically mentioning strawberry ice cream, although she is not allergic to strawberries and typically does not consume much sugar.  She recalls a previous issue with her left index finger, which was getting stuck and causing pain. She received a corticosteroid injection for this problem during her last visit, and since then, she has not experienced any further issues, indicating improvement.  She is currently taking leflunomide  and reports no stomach trouble associated with it. She was recently prescribed carvedilol  for her heart, but it causes her to feel dizzy and lightheaded, leading to a reduction in dosage. Despite the adjustment, she continues to experience these side effects, describing a sensation of being 'drunk'.  Regarding her joints, she experiences occasional aches, particularly when it rains, but finds relief through exercise. No increased swelling or weakness in her legs. She occasionally takes over-the-counter allergy medications like Zyrtec (cetirizine) 10 mg at night to avoid drowsiness during the day while caring for her four-year-old great-grandchild.    Previous HPI 10/07/2023 Maria Foster is a 68 y.o. female here for follow up for seropositive nodular  rheumatoid arthritis on leflunomide  20 mg p.o. daily.  Overall most of her symptoms are doing well and has not noticed either increase nor decrease in the nodules.  Current worst complaint is pain at her left index finger.  This hurts at rest and with use and she notices more frequently if the finger getting stuck and some decrease in ability to close her hand.  Not associated with any change in sensation or loss of grip strength.   Previous HPI 07/09/2023 Maria Foster is a 68 y.o. female here for follow up for seropositive nodular rheumatoid arthritis on leflunomide  20 mg p.o. daily.  She has been doing well without any major flareup without significant joint pain or swelling until the past week.  She did a a lot of cleaning around the house including scrubbing of kitchen back splash and afterwards developed increased pain and swelling in the right wrist.  So far she is not taken anything extra or tried using her wrist band that she sometimes wears in the past for pain relief.   Previous HPI 04/08/2023 Maria Foster is a 68 y.o. female here for follow up for seropositive nodular rheumatoid arthritis on leflunomide  20 mg p.o. daily.  Has not seen much additional change in the hand and elbow nodules.  Not having significant hand pain or stiffness most days.  She noticed some worsening in her knee pain during recent rainy weather but on other days has no symptoms.  She feels she is noticing some gassiness  or bloating with the leflunomide  but not too severe and is still taking the medicine as directed.  She had recent labs checked in April which we reviewed her blood count is normal and metabolic panel shows estimated GFR of 51 which is stable for her baseline.   Previous HPI 12/24/22 Maria Foster is a 68 y.o. female here for follow up for seropositive nodular RA on leflunomide  20 mg p.o. daily.  Unfortunately had interruption with starting due to hospital admission for systemic inflammation starting  from urinary tract infection.  Joint pain symptoms have not changed significantly.  She is having issue with ongoing nonproductive cough.  Also with frequent eye watering and some irritation no vision changes and no associated swelling.  She has upcoming evaluations for her lungs and congestive heart failure.   Previous HPI 09/26/22 Maria Foster is a 68 y.o. female here for follow up for seropositive RA we did not start methotrexate as planned due to renal impairment worse than baseline checked at clinic appointment.  Symptoms remain about the same and also not seeing any worsening with stopping hydroxychloroquine .   Previous HPI 07/02/2022 Maria Foster is a 68 y.o. female here for follow up seropositive RA on hydroxychloroquine  200 mg daily.  She feels like symptoms have made no significant difference on this treatment.  No recurrence of severe flareups like she had in her elbow either.  She does notice a few new and expanding nodules with episodes of spontaneous rupturing and white fluid drainage. Does not have much joint pain or swelling outside of the nodules.   Previous HPI 01/01/2022 Maria Foster is a 68 y.o. female here for follow up for seropositive RA on HCQ 200 mg daily. After our last visit with right elbow injection she developed worsening pain and then purulent drainage found to be MSSA infection of that right olecranon bursa and went to the hospital for surgical debridement of the wound. Wound culture was obtained also surgical report of rheumatoid nodule at the site. She took a course of antibiotics with PICC line completed without incident. She resumed hydroxychloroquine  after this and she feels her joint pain is very well controlled at this time. She notices some increase in stiffness and pain with cold and changing weather.   Previous HPI 07/03/21 Maria Foster is a 68 y.o. female here for follow up for seropositive RA and chronic tophaceous gout. Recommended starting  hydroxychloroquine  treatment 200 mg PO daily for suspected RA as active disease and normal uric levels. She feels her symptoms are improved since starting this, particularly states pain with weather and temperature changes are decreased. She has right elbow pain more than elsewhere says this hurts mostly due to pressure on the site when lying in bed or certain seated positions.   Previous HPI: 03/29/21 MERRILL DEANDA is a 68 y.o. female referred here for evaluation of rheumatoid arthritis.  She has a longstanding history of bilateral hand and knee pain that has been going on for years with some progressive worsening and deformity over time.  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  She also has chronic knee pain and stiffness bilaterally without any large nodular changes.  She does not have much joint pain most days however experiences some episodically and also feels that her  hands and knees hurt worse with cold and rainy weather.  She describes a history of rheumatoid arthritis but has never been on treatment plans for this.  She states she has never had any episode of gout involving the toes or feet but has been told by her cardiologist this is a problem that she has.   She does not recall ever undergoing bone density screening for osteoporosis. She has a long smoking history but in recent months is either not smoking or very reduced usage most days.   Review of Systems  Constitutional:  Negative for fatigue.  HENT:  Negative for mouth sores and mouth dryness.   Eyes:  Positive for dryness.  Respiratory:  Negative for shortness of breath.   Cardiovascular:  Negative for chest pain and palpitations.  Gastrointestinal:  Negative for blood in stool, constipation and diarrhea.  Endocrine: Negative for increased  urination.  Genitourinary:  Negative for involuntary urination.  Musculoskeletal:  Positive for joint pain, joint pain and joint swelling. Negative for gait problem, myalgias, muscle weakness, morning stiffness, muscle tenderness and myalgias.  Skin:  Negative for color change, rash, hair loss and sensitivity to sunlight.  Allergic/Immunologic: Positive for susceptible to infections.  Neurological:  Negative for dizziness and headaches.  Hematological:  Negative for swollen glands.  Psychiatric/Behavioral:  Negative for depressed mood and sleep disturbance. The patient is not nervous/anxious.     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 type 2 diabetes mellitus (HCC) 11/16/2021   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 2 diabetes mellitus (HCC) 11/16/2021   OSA (obstructive sleep apnea) 08/25/2021   Iron deficiency 08/25/2021   Screening for cancer 08/25/2021   Influenza vaccination declined 08/25/2021   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   Stage 3a chronic kidney disease (HCC) 07/11/2021   Essential hypertension, benign 05/04/2021   Hyperlipidemia associated with type 2 diabetes mellitus (HCC) 05/04/2021   Enlarged and hypertrophic nails 05/04/2021   Encounter for screening mammogram for malignant neoplasm of breast 05/04/2021   Need for pneumococcal vaccination 05/04/2021   CHF (congestive heart failure) (HCC) 05/04/2021   Stable angina (HCC) 05/04/2021   Vaccine counseling  05/04/2021   High risk medication use 03/29/2021   Screen for colon cancer 01/27/2021   Tophi 01/11/2021   Rheumatoid arthritis involving both hands (HCC) 01/11/2021   Elevated troponin    Type 2 diabetes mellitus (HCC) 12/19/2020   Constipation 12/19/2020    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   OU progressive, will medically need to have cataract surgery with lens will implantation so as to allow for complete clearance of the vitreous cavity in anticipation of likely vitrectomy in the future for progressive PDR despite previous PRP as delineated by the E DRS series of reports   Diabetes mellitus type 2 with complications (HCC)    diagnosed probably 2012 per patient   Diabetic retinopathy (HCC)    Hypertension    Rheumatoid arthritis (HCC)  Septic olecranon bursitis of right elbow 07/11/2021    Family History  Problem Relation Age of Onset   Heart attack Mother    Heart failure Mother    Diabetes Mother    Hypertension Mother    Heart failure Sister    Heart Problems Sister    Heart failure Sister    Hypertension Daughter    Amblyopia Neg Hx    Blindness Neg Hx    Cataracts Neg Hx    Glaucoma Neg Hx    Macular degeneration Neg Hx    Retinal detachment Neg Hx    Strabismus Neg Hx    Retinitis pigmentosa Neg Hx    Past Surgical History:  Procedure Laterality Date   INCISION AND DRAINAGE ABSCESS N/A 07/11/2021   Procedure: INCISION AND DRAINAGE RIGHT ELBOW;  Surgeon: Claiborne Crew, MD;  Location: [facility];  Service: Orthopedics;  Laterality: N/A;   PRP Bilateral OD - 07/23/2017 OS - 08/06/2017   RIGHT/LEFT HEART CATH AND CORONARY ANGIOGRAPHY N/A 01/05/2021   Procedure: RIGHT/LEFT HEART CATH AND CORONARY ANGIOGRAPHY;  Surgeon: Mardell Shade, MD;  Location: [facility];  Service: Cardiovascular;  Laterality: N/A;   TEE WITHOUT CARDIOVERSION N/A 10/09/2021   Procedure: TRANSESOPHAGEAL  ECHOCARDIOGRAM (TEE);  Surgeon: Mardell Shade, MD;  Location: [facility];  Service: Cardiovascular;  Laterality: N/A;   Social History   Social History Narrative   Not on file   Immunization History  Administered Date(s) Administered   PFIZER(Purple Top)SARS-COV-2 Vaccination 02/09/2020, 03/01/2020, 11/05/2020   PNEUMOCOCCAL CONJUGATE-20 12/16/2023   Pfizer(Comirnaty)Fall Seasonal Vaccine 12 years and older 12/14/2022   Pneumococcal Polysaccharide-23 05/04/2021   Tdap 07/11/2021     Objective: Vital Signs: BP (!) 202/78 (BP Location: Left Arm, Patient Position: Sitting, Cuff Size: Normal)   Pulse 69   Resp 16   Ht 5\' 3"  (1.6 m)   Wt 167 lb (75.8 kg)   BMI 29.58 kg/m    Physical Exam Eyes:     Conjunctiva/sclera: Conjunctivae normal.  Cardiovascular:     Rate and Rhythm: Normal rate and regular rhythm.  Pulmonary:     Effort: Pulmonary effort is normal.     Breath sounds: Normal breath sounds.  Lymphadenopathy:     Cervical: No cervical adenopathy.  Skin:    General: Skin is warm and dry.  Neurological:     Mental Status: She is alert.  Psychiatric:        Mood and Affect: Mood normal.      Musculoskeletal Exam:  Shoulders full ROM no tenderness or swelling Elbows full ROM no tenderness or swelling, multiple nontender large mobile nodules on olecranon process and on forearm extensor surface Right wrist tenderness and swelling with decreased flexion extension range of motion, left wrist no pain or swelling but with multiple nodules on dorsal side Fingers chronic joint changes with numerous nontender nodules most extensive on MCP and PIP joints, slightly decreased flexion range of motion Left 2nd digit trigger, tenderness to pressure with nodule proximal to MCP joint on palmar side Knees full ROM no tenderness or swelling  Investigation: No additional findings.  Imaging: No results found.   Recent Labs: Lab Results  Component Value Date   WBC 6.9  02/17/2024   HGB 11.3 02/17/2024   PLT 167 02/17/2024   NA 138 02/17/2024   K 3.9 02/17/2024   CL 105 02/17/2024   CO2 20 02/17/2024   GLUCOSE 171 (H) 02/17/2024   BUN 27 02/17/2024   CREATININE 1.21 (  H) 02/17/2024   BILITOT 0.4 02/17/2024   ALKPHOS 130 (H) 02/17/2024   AST 13 02/17/2024   ALT 8 02/17/2024   PROT 7.0 02/17/2024   ALBUMIN 4.0 02/17/2024   CALCIUM  8.9 02/17/2024   GFRAA >60 11/29/2016    Speciality Comments: No specialty comments available.  Procedures:  No procedures performed Allergies: Metformin  and related and Farxiga  [dapagliflozin ]   Assessment / Plan:     Visit Diagnoses: Rheumatoid arthritis involving both hands with positive rheumatoid factor (HCC) - Plan: leflunomide  (ARAVA ) 20 MG tablet Extensive nodular disease without much current pain or swelling. Condition well-managed on leflunomide . Occasional aches relieved by exercise. - Continue leflunomide  20 mg PO daily.  High risk medication use - leflunomide  20 mg daily. Recently had labs with CBC and CMP were stable, eGFR at 49, no problem for continuing leflunomide .  White coat syndrome with hypertension Essential hypertension, benign Dizziness secondary to carvedilol  Persistent dizziness despite dose reduction. Follow-up with cardiologist planned.  Finger pain, left - S/P Hand/UE Inj: L index A1 for trigger finger on 10/07/2023 Finger triggering is greatly improved, none noticed lately after injection about 5 months ago.  Allergic rhinitis Well-managed with cetirizine. - Continue cetirizine 10 mg as needed    Orders: No orders of the defined types were placed in this encounter.  Meds ordered this encounter  Medications   leflunomide  (ARAVA ) 20 MG tablet    Sig: Take 1 tablet (20 mg total) by mouth daily.    Dispense:  90 tablet    Refill:  0     Follow-Up Instructions: Return in about 3 months (around 06/01/2024) for RA on LEF f/u 3mos.   Matt Song, MD  Note - This  record has been created using [employer].  Chart creation errors have been sought, but may not always  have been located. Such creation errors do not reflect on  the standard of medical care.

## 2024-02-18 LAB — HEMOGLOBIN A1C
Est. average glucose Bld gHb Est-mCnc: 160 mg/dL
Hgb A1c MFr Bld: 7.2 % — ABNORMAL HIGH (ref 4.8–5.6)

## 2024-02-18 LAB — COMPREHENSIVE METABOLIC PANEL WITH GFR
ALT: 8 IU/L (ref 0–32)
AST: 13 IU/L (ref 0–40)
Albumin: 4 g/dL (ref 3.9–4.9)
Alkaline Phosphatase: 130 IU/L — ABNORMAL HIGH (ref 44–121)
BUN/Creatinine Ratio: 22 (ref 12–28)
BUN: 27 mg/dL (ref 8–27)
Bilirubin Total: 0.4 mg/dL (ref 0.0–1.2)
CO2: 20 mmol/L (ref 20–29)
Calcium: 8.9 mg/dL (ref 8.7–10.3)
Chloride: 105 mmol/L (ref 96–106)
Creatinine, Ser: 1.21 mg/dL — ABNORMAL HIGH (ref 0.57–1.00)
Globulin, Total: 3 g/dL (ref 1.5–4.5)
Glucose: 171 mg/dL — ABNORMAL HIGH (ref 70–99)
Potassium: 3.9 mmol/L (ref 3.5–5.2)
Sodium: 138 mmol/L (ref 134–144)
Total Protein: 7 g/dL (ref 6.0–8.5)
eGFR: 49 mL/min/{1.73_m2} — ABNORMAL LOW (ref >59)

## 2024-02-18 LAB — CBC
Hematocrit: 36.4 % (ref 34.0–46.6)
Hemoglobin: 11.3 g/dL (ref 11.1–15.9)
MCH: 28.8 pg (ref 26.6–33.0)
MCHC: 31 g/dL — ABNORMAL LOW (ref 31.5–35.7)
MCV: 93 fL (ref 79–97)
Platelets: 167 10*3/uL (ref 150–450)
RBC: 3.93 x10E6/uL (ref 3.77–5.28)
RDW: 12.3 % (ref 11.7–15.4)
WBC: 6.9 10*3/uL (ref 3.4–10.8)

## 2024-02-19 NOTE — Progress Notes (Signed)
 Labs showed normal blood counts, kidney marker abnormal but stable, electrolytes okay, liver test okay, diabetes marker 7.2%.  Still not quite to goal but better than it was  Have her look back over her after visit summary is a bit quite a few recommendations and clarifications last visit  If she is comfortable continuing the same medications we will leave them alone for now.  The other option would be to possibly add some insulin to her regimen to be able to come off glipizide if you want to get A1c down even further under 7%.  The goal is good sugar control to reduce the chance of other diabetes related complications such as damage to the or further damage to the kidney or other.

## 2024-02-25 ENCOUNTER — Encounter (HOSPITAL_BASED_OUTPATIENT_CLINIC_OR_DEPARTMENT_OTHER): Payer: Self-pay | Admitting: Family

## 2024-02-25 ENCOUNTER — Ambulatory Visit (HOSPITAL_BASED_OUTPATIENT_CLINIC_OR_DEPARTMENT_OTHER): Admitting: Family

## 2024-02-25 VITALS — BP 148/92 | HR 67 | Ht 63.0 in | Wt 168.2 lb

## 2024-02-25 DIAGNOSIS — I25118 Atherosclerotic heart disease of native coronary artery with other forms of angina pectoris: Secondary | ICD-10-CM | POA: Diagnosis not present

## 2024-02-25 DIAGNOSIS — I1A0 Resistant hypertension: Secondary | ICD-10-CM

## 2024-02-25 DIAGNOSIS — E785 Hyperlipidemia, unspecified: Secondary | ICD-10-CM | POA: Diagnosis not present

## 2024-02-25 DIAGNOSIS — I5040 Unspecified combined systolic (congestive) and diastolic (congestive) heart failure: Secondary | ICD-10-CM | POA: Diagnosis not present

## 2024-02-25 MED ORDER — FUROSEMIDE 40 MG PO TABS: 40.0000 mg | ORAL_TABLET | Freq: Every day | ORAL | 3 refills | Status: DC

## 2024-02-25 MED ORDER — SPIRONOLACTONE 50 MG PO TABS: 50.0000 mg | ORAL_TABLET | Freq: Every day | ORAL | 2 refills | Status: DC

## 2024-02-25 MED ORDER — ATORVASTATIN CALCIUM 40 MG PO TABS
40.0000 mg | ORAL_TABLET | Freq: Every day | ORAL | 3 refills | Status: AC
Start: 2024-02-25 — End: ?

## 2024-02-25 MED ORDER — CARVEDILOL 12.5 MG PO TABS: 12.5000 mg | ORAL_TABLET | Freq: Two times a day (BID) | ORAL | 1 refills | Status: DC

## 2024-02-25 MED ORDER — ISOSORBIDE MONONITRATE ER 60 MG PO TB24: 60.0000 mg | ORAL_TABLET | Freq: Every day | ORAL | 11 refills | Status: AC

## 2024-02-25 MED ORDER — HYDRALAZINE HCL 100 MG PO TABS: 100.0000 mg | ORAL_TABLET | Freq: Three times a day (TID) | ORAL | 3 refills | Status: DC

## 2024-02-25 NOTE — Patient Instructions (Signed)
 Medication Instructions:  RESUME Carvedilol at dose of 12.5mg  twice daily  *If you need a refill on your cardiac medications before your next appointment, please call your pharmacy*  Follow-Up: At [facility], you and your health needs are our priority.  As part of our continuing mission to provide you with exceptional heart care, our providers are all part of one team.  This team includes your primary Cardiologist (physician) and Advanced Practice Providers or APPs (Physician Assistants and Nurse Practitioners) who all work together to provide you with the care you need, when you need it.  Your next appointment:   3 week(s)  Provider:   Chilton Si, MD, Gillian Shields, NP, or Phillips Hay, PharmD  in Hypertension Clinic   We recommend signing up for the patient portal called "MyChart".  Sign up information is provided on this After Visit Summary.  MyChart is used to connect with patients for Virtual Visits (Telemedicine).  Patients are able to view lab/test results, encounter notes, upcoming appointments, etc.  Non-urgent messages can be sent to your provider as well.   To learn more about what you can do with MyChart, go to [email or web address].   Other Instructions  Bring blood pressure and pill bottles to your next office visit please.

## 2024-02-25 NOTE — Progress Notes (Signed)
 Advanced Hypertension Clinic Assessment:    Date:  02/25/2024   ID:  Maria Foster, DOB 1956-08-23, MRN [id number]  PCP:  Jac Canavan, PA-C  Cardiologist:  Chilton Si, MD  Nephrologist:  Referring MD: Jac Canavan, PA-C   CC: Hypertension  History of Present Illness:    Maria Foster is a 68 y.o. female with a hx of diabetes, hypertension, CKD 3, CAD, chronic systolic and diastolic heart failure, tobacco use, rheumatoid arthritis, renal artery stenosis,  prior tobacco use, OSA (did not tolerate CPAP), PAD here to follow up in the Advanced Hypertension Clinic.   Prior admission 11/2021 new onset heart failure echo LVEF 35-40% with moderate LV dysfunction and global hypokinesis with grade 2 diastolic dysfunction.  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  She had mild to moderate AI.  She was diuresed underwent LHC 12/2020 revealing three-vessel CAD and a totally occluded RCA with left-to-right collaterals.  She 80% mid circumflex stenosis and nonobstructive disease in the LAD, LVEF by LHC to 40%.  She was medically managed given her lack of angina.  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  Repeat echo 04/2021 LVEF 55%. TEE 09/2021 LVEF 55% with mild rheumatic mitral disease and mild to moderate MR. CT 10/2022 with normal adrenal glands (no evidence of pheochromocytoma). Renal duplex 03/18/23 with left renal artery with 1-59% stenosis. Repeat study in one year recommended.  BP control has been difficult with multiple medication changes and episodes of nonadherence.  Presents today for follow up. She did not like the clonidine because it made her feel groggy and symptoms persisted despite reduced dose. She also is not taking carvedilol as thought it was casuing the groggy feeling . She reports her SBP this morning was 127 and other days around 130's. She did not take her afternoon dose of hydralazine since  she was in clinic visit and forgot her medication at home. She drinks around 4 bottles of water and a cup of coffee daily and likes to be outside and gardens. She enjoys cooking at home and does not eat out often. She has not taken her spironolactone, atorvastatin, and furosemide for two weeks d/t running out. Reports no shortness of breath nor dyspnea on exertion. Reports no chest pain, pressure, or tightness. No edema, orthopnea, PND. Reports no palpitations.   Previous antihypertensives: Amlodipine Clonidine  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   OU progressive, will medically need to have cataract surgery with lens will implantation so as to allow for complete clearance of the vitreous cavity in anticipation of likely vitrectomy in the future for progressive PDR despite previous PRP as delineated by the E DRS series of reports   Diabetes mellitus type 2 with complications (HCC)    diagnosed probably 2012 per patient   Diabetic retinopathy (HCC)    Hypertension    Rheumatoid arthritis (HCC)    Septic olecranon bursitis of right elbow 07/11/2021    Past Surgical History:  Procedure Laterality Date   INCISION AND DRAINAGE ABSCESS N/A 07/11/2021   Procedure: INCISION AND DRAINAGE RIGHT ELBOW;  Surgeon: Durene Romans, MD;  Location: [facility] ORS;  Service: Orthopedics;  Laterality: N/A;   PRP Bilateral OD - 07/23/2017 OS - 08/06/2017   RIGHT/LEFT HEART CATH AND CORONARY ANGIOGRAPHY N/A 01/05/2021   Procedure: RIGHT/LEFT HEART CATH AND CORONARY ANGIOGRAPHY;  Surgeon: Dolores Patty, MD;  Location: [facility] INVASIVE CV LAB;  Service: Cardiovascular;  Laterality: N/A;   TEE WITHOUT CARDIOVERSION N/A 10/09/2021   Procedure: TRANSESOPHAGEAL ECHOCARDIOGRAM (TEE);  Surgeon: Dolores Patty, MD;  Location: [facility] ENDOSCOPY;  Service: Cardiovascular;  Laterality: N/A;    Current Medications: Current Meds  Medication Sig    acetaminophen (TYLENOL) 325 MG tablet Take 1-2 tablets (325-650 mg total) by mouth every 6 (six) hours as needed for mild pain (pain score 1-3 or temp > 100.5).   aspirin EC 81 MG tablet Take 81 mg by mouth daily.   atorvastatin (LIPITOR) 40 MG tablet TAKE 1 TABLET(40 MG) BY MOUTH DAILY   ENTRESTO 97-103 MG TAKE 1 TABLET BY MOUTH TWICE DAILY   ferrous gluconate (FERGON) 324 MG tablet Take 1 tablet (324 mg total) by mouth daily with breakfast.   furosemide (LASIX) 40 MG tablet Take 1 tablet (40 mg total) by mouth daily.   glipiZIDE (GLUCOTROL) 5 MG tablet Take 1 tablet (5 mg total) by mouth 2 (two) times daily before a meal.   hydrALAZINE (APRESOLINE) 100 MG tablet Take 1 tablet (100 mg total) by mouth 3 (three) times daily.   isosorbide mononitrate (IMDUR) 60 MG 24 hr tablet Take 1 tablet (60 mg total) by mouth daily.   latanoprost (XALATAN) 0.005 % ophthalmic solution Place 1 drop into both eyes at bedtime.   leflunomide (ARAVA) 20 MG tablet Take 1 tablet (20 mg total) by mouth daily.   linagliptin (TRADJENTA) 5 MG TABS tablet Take 1 tablet (5 mg total) by mouth daily.   spironolactone (ALDACTONE) 50 MG tablet Take 1 tablet (50 mg total) by mouth daily.     Allergies:   Metformin and related and [place] [dapagliflozin]   Social History   Socioeconomic History   Marital status: Married    Spouse name: Not on file   Number of children: Not on file   Years of education: Not on file   Highest education level: Not on file  Occupational History   Occupation: retired  Tobacco Use   Smoking status: Former    Current packs/day: 0.00    Average packs/day: 0.5 packs/day for 15.0 years (7.5 ttl pk-yrs)    Types: Cigarettes    Start date: 09/2007    Quit date: 09/2022    Years since quitting: 1.4    Passive exposure: Past   Smokeless tobacco: Never   Tobacco comments:    Quit 10/28/2022  Vaping Use   Vaping status: Never Used  Substance and Sexual Activity   Alcohol use: Not Currently    Drug use: No   Sexual activity: Not on file  Other Topics Concern   Not on file  Social History Narrative   Not on file   Social Drivers of Health   Financial Resource Strain: Low Risk  (12/10/2023)   Overall Financial Resource Strain (CARDIA)    Difficulty of Paying Living Expenses: Not hard at all  Food Insecurity: No Food Insecurity (12/10/2023)   Hunger Vital Sign    Worried About Running Out of Food in the Last Year: Never true    Ran Out of Food in the Last Year: Never true  Transportation Needs: No Transportation Needs (12/10/2023)   PRAPARE - Administrator, Civil Service (Medical): No    Lack of Transportation (Non-Medical): No  Physical Activity: Insufficiently Active (12/10/2023)   Exercise Vital Sign    Days of Exercise per Week: 7 days  Minutes of Exercise per Session: 20 min  Stress: No Stress Concern Present (12/10/2023)   [employer] of Occupational Health - Occupational Stress Questionnaire    Feeling of Stress : Not at all  Social Connections: Moderately Isolated (12/10/2023)   Social Connection and Isolation Panel [NHANES]    Frequency of Communication with Friends and Family: More than three times a week    Frequency of Social Gatherings with Friends and Family: Once a week    Attends Religious Services: Never    Database administrator or Organizations: No    Attends Engineer, structural: Never    Marital Status: Married     Family History: The patient's family history includes Diabetes in her mother; Heart Problems in her sister; Heart attack in her mother; Heart failure in her mother, sister, and sister; Hypertension in her daughter and mother. There is no history of Amblyopia, Blindness, Cataracts, Glaucoma, Macular degeneration, Retinal detachment, Strabismus, or Retinitis pigmentosa.  ROS:   Please see the history of present illness.     All other systems reviewed and are negative.  EKGs/Labs/Other Studies Reviewed:     EKG Interpretation Date/Time:  Tuesday February 25 2024 14:16:54 EDT Ventricular Rate:  90 PR Interval:  146 QRS Duration:  104 QT Interval:  396 QTC Calculation: 484 R Axis:   -39  Text Interpretation: Normal sinus rhythm Left axis deviation Minimal voltage criteria for LVH, may be normal variant ( Cornell product ) No acute ST/T wave changes Confirmed by Gillian Shields ([id number]) on 02/25/2024 2:21:18 PM    Recent Labs: 02/17/2024: ALT 8; BUN 27; Creatinine, Ser 1.21; Hemoglobin 11.3; Platelets 167; Potassium 3.9; Sodium 138   Recent Lipid Panel    Component Value Date/Time   CHOL 124 12/16/2023 1205   TRIG 57 12/16/2023 1205   HDL 48 12/16/2023 1205   CHOLHDL 2.6 12/16/2023 1205   CHOLHDL 3.1 01/25/2021 1208   VLDL 10 01/25/2021 1208   LDLCALC 63 12/16/2023 1205    Physical Exam:   VS:  BP (!) 148/92   Pulse 67   Ht 5\' 3"  (1.6 m)   Wt 168 lb 3.2 oz (76.3 kg)   SpO2 97%   BMI 29.80 kg/m  , BMI Body mass index is 29.8 kg/m.  Vitals:   02/25/24 1420 02/25/24 1450  BP: (!) 168/120 (!) 148/92  Pulse: 67   Height: 5\' 3"  (1.6 m)   Weight: 168 lb 3.2 oz (76.3 kg)   SpO2: 97%   BMI (Calculated): 29.8     GENERAL:  Well appearing HEENT: Pupils equal round and reactive, fundi not visualized, oral mucosa unremarkable NECK:  No jugular venous distention, waveform within normal limits, carotid upstroke brisk and symmetric, no bruits, no thyromegaly LYMPHATICS:  No cervical adenopathy LUNGS:  Clear to auscultation bilaterally HEART:  RRR.  PMI not displaced or sustained,S1 and S2 within normal limits, no S3, no S4, no clicks, no rubs, murmurs ABD:  Flat, positive bowel sounds normal in frequency in pitch, no bruits, no rebound, no guarding, no midline pulsatile mass, no hepatomegaly, no splenomegaly EXT:  2 plus pulses throughout, no edema, no cyanosis no clubbing SKIN:  No rashes no nodules NEURO:  Cranial nerves II through XII grossly intact, motor grossly intact  throughout PSYCH:  Cognitively intact, oriented to person place and time   ASSESSMENT/PLAN:    Hypertension- BP not at goal in clinic but reports home readings 130's. Initial BP in clinic 168/120 with repeat 148/92.  She reports not taking the clonidine and carvedilol d/t groggy feeling. Reassured her that the clonidine was the probable cause of groggy feeling. Remain off Clonidine.  BP elevated in setting of stressors, missed afternoon dose of Hydralazine, and being out of Spironolactone, Furosemide for several weeks. Also has not taken Carvedilol in some time.  Continue Entresto 97-103 mg BID, hydralazine 100 mg TID, spironolactone 50 mg daily, and Imdur 60 mg daily.  Reduce Carvedilol from 25 mg to 12.5 mg BID to make sure well tolerated as has not taken in some time Discussed adherence packaging through [facility] pharmacy, she will consider and re-discuss at follow up.  Discussed to monitor BP at home at least 2 hours after medications and sitting for 5-10 minutes.  Chronic systolic and diastolic heart failure with recovered LVEF- Appears euvolemic on exam. NYHA: grade I-II. S. She has not taken her furosemide or spironalactone in two weeks since she ran out. Refills provided. Continue current GDMT: spironolactone 50 mg daily, Entresto 97-103 mg BID, furosemide 40 mg daily, and carvedilol 12.5 mg.   CAD / HLD, LDL goal <70 - Stable with no anginal symptoms. No indication for ischemic evaluation. She has not taken her atorvastatin for 2 weeks d/t running out of medication.  GDMT: aspirin EC 81 mg, atorvastatin 40 mg daily, carvedilol 12.5 mg BID, and Imdur 60 mg daily.  Refills sent.   Screening for Secondary Hypertension:     02/20/2023    3:11 PM  Causes  Drugs/Herbals Screened     - Comments Limits sodium intake.  Quit smoking 2023.  Drinks 3 cups of coffee daily.  No NSAIDS  Renovascular HTN Screened     - Comments Check renal Dopplers  Sleep Apnea Screened     - Comments No symptoms   Thyroid Disease Screened  Hyperaldosteronism Screened     - Comments Check renin and aldosterone  Pheochromocytoma N/A  Cushing's Syndrome N/A  Hyperparathyroidism Screened  Coarctation of the Aorta Screened     - Comments BP symmetric  Compliance Screened    Relevant Labs/Studies:    Latest Ref Rng & Units 02/17/2024    2:14 PM 10/07/2023    3:44 PM 07/09/2023    3:29 PM  Basic Labs  Sodium 134 - 144 mmol/L 138  139  137   Potassium 3.5 - 5.2 mmol/L 3.9  4.0  4.2   Creatinine 0.57 - 1.00 mg/dL 1.61  0.96  0.45        Latest Ref Rng & Units 12/21/2020   12:16 PM 10/18/2009    3:15 AM  Thyroid   TSH 0.350 - 4.500 uIU/mL 1.514  0.688 Test methodology is 3rd generation TSH                 04/12/2023    3:35 PM  Renovascular   Renal Artery [place] Completed Yes       Disposition:    FU with MD/APP/PharmD in 3 week    Medication Adjustments/Labs and Tests Ordered: Current medicines are reviewed at length with the patient today.  Concerns regarding medicines are outlined above.  Orders Placed This Encounter  Procedures   EKG 12-Lead   No orders of the defined types were placed in this encounter.    Signed, Alver Sorrow, NP  02/25/2024 3:10 PM    [facility]

## 2024-03-02 ENCOUNTER — Encounter: Payer: Self-pay | Admitting: Internal Medicine

## 2024-03-02 ENCOUNTER — Ambulatory Visit: Payer: Medicare Other | Attending: Internal Medicine | Admitting: Internal Medicine

## 2024-03-02 VITALS — BP 178/84 | HR 71 | Resp 16 | Ht 63.0 in | Wt 167.0 lb

## 2024-03-02 DIAGNOSIS — M79645 Pain in left finger(s): Secondary | ICD-10-CM

## 2024-03-02 DIAGNOSIS — Z79899 Other long term (current) drug therapy: Secondary | ICD-10-CM

## 2024-03-02 DIAGNOSIS — M05742 Rheumatoid arthritis with rheumatoid factor of left hand without organ or systems involvement: Secondary | ICD-10-CM | POA: Diagnosis not present

## 2024-03-02 DIAGNOSIS — I10 Essential (primary) hypertension: Secondary | ICD-10-CM | POA: Diagnosis not present

## 2024-03-02 DIAGNOSIS — M05741 Rheumatoid arthritis with rheumatoid factor of right hand without organ or systems involvement: Secondary | ICD-10-CM | POA: Diagnosis not present

## 2024-03-02 MED ORDER — LEFLUNOMIDE 20 MG PO TABS: 20.0000 mg | ORAL_TABLET | Freq: Every day | ORAL | 0 refills | Status: DC

## 2024-03-17 ENCOUNTER — Ambulatory Visit (HOSPITAL_BASED_OUTPATIENT_CLINIC_OR_DEPARTMENT_OTHER): Admitting: Pharmacist Clinician (PhC)/ Clinical Pharmacy Specialist

## 2024-03-17 ENCOUNTER — Encounter (HOSPITAL_BASED_OUTPATIENT_CLINIC_OR_DEPARTMENT_OTHER): Payer: Self-pay | Admitting: Pharmacist Clinician (PhC)/ Clinical Pharmacy Specialist

## 2024-03-17 VITALS — BP 191/92 | HR 68 | Wt 164.2 lb

## 2024-03-17 DIAGNOSIS — I1A0 Resistant hypertension: Secondary | ICD-10-CM | POA: Diagnosis not present

## 2024-03-17 DIAGNOSIS — I10 Essential (primary) hypertension: Secondary | ICD-10-CM

## 2024-03-17 MED ORDER — SPIRONOLACTONE 50 MG PO TABS: 50.0000 mg | ORAL_TABLET | Freq: Every day | ORAL | 3 refills | Status: DC

## 2024-03-17 NOTE — Assessment & Plan Note (Signed)
 Assessment: BP is uncontrolled in office BP 191/92 mmHg;  above the goal (<130/80). No home readings with her but notes range 128-151 systolic Tolerates Entresto  97/103 bid, hydralazine  100 mg tid, spironolactone  50 mg every day, carvedilol  12.5 mg bid, imdur  60 mg every day  well without any side effects Denies SOB, palpitation, chest pain, headaches,or swelling Reiterated the importance of regular exercise and low salt diet   Plan:  Increase spironolactone  to 50 mg twice daily  Continue taking Entresto  97/103 bid, hydralazine  100 mg tid, carvedilol  12.5 mg bid, imdur  60 mg every day  Patient to keep record of BP readings with heart rate and report to us  at the next visit Patient to follow up with me in 6 weeks - at [facility]. office  Labs ordered today:  BMET in 2 weeks Recently received 3 month supply of meds, will plan for adherence packaging at next visit.

## 2024-03-17 NOTE — Progress Notes (Signed)
 Office Visit    Patient Name: Maria Foster Date of Encounter: 03/17/2024  Primary Care Provider:  Claudene Crystal, PA-C Primary Cardiologist:  Maudine Sos, MD  Chief Complaint    Hypertension - Advanced hypertension clinic  Past Medical History   DM2 3/25 A1c 7.25  CKD Stage 3 GFR 49, SCr 1.21  CAD Occluded RCA with collaterals, 80% mLCx  CHF 1823 new onset; most recent echo at 45-50$  RAS 4/24 Left 1-59% stenosis, repeat in 1 year  OSA Did not tolerate CPAP  PAD Moderate disease by ABI's    Allergies  Allergen Reactions   Metformin  And Related Other (See Comments)    Kidney injury, GI upset, diarrhea   Farxiga  [Dapagliflozin ] Other (See Comments)    Yeast infections, recurrent    History of Present Illness    Maria Foster is a 68 y.o. female patient who was referred to the Advanced Hypertension Clinic by Nelda Balsam PA.  She was most recently seen about 3 weeks ago by Neomi Banks, with a BP of 148/92. At that visit she complained clonidine  and carvedilol  caused her to be groggy and had stopped both.  She had also been out of spironolactone , atorvastatin  and furosemide  for about 2 weeks.  Noted that home BP readings were 120-130 range, but in clinic that day was 168/120  initially, dropping to 148/92.  Adherence packaging was discussed, patient was going to consider.  Because she just picked up a 3 month supply of most of her medications, will defer that until next visit.    Blood Pressure Goal:  130/80  Current Medications: Entresto  97/103 bid, hydralazine  100 mg tid, spironolactone  50 mg every day, carvedilol  12.5 mg bid, imdur  60 mg every day   Previously tried:  amlodipine  - HF, recovered LVEF (use only if necessary)   Family Hx:  mother had hypertension; had heart issues, died at 33; no hx of father; 1 sister with hypertension (8 siblings)   Social Hx:                 Tobacco: no - quit             Alcohol: no             Caffeine:  switched to  decaf since last visit; occasional Coke; mostly water    Diet: cooks most meal, no added salt mostly (few with cooking); lots of fish and chicken, rare pork, some beef; vegetables usually frozen; not snakcing      Exercise: exercise bike, treadmill, yoga mat, stretch bands; about 45 minutes per day - created new exercise room in the house; she and husband are committed to daily exercise to help with weight management.    Home BP readings: no readings with her but notes highest was 128-151/75-80    Accessory Clinical Findings    Lab Results  Component Value Date   CREATININE 1.21 (H) 02/17/2024   BUN 27 02/17/2024   NA 138 02/17/2024   K 3.9 02/17/2024   CL 105 02/17/2024   CO2 20 02/17/2024   Lab Results  Component Value Date   ALT 8 02/17/2024   AST 13 02/17/2024   ALKPHOS 130 (H) 02/17/2024   BILITOT 0.4 02/17/2024   Lab Results  Component Value Date   HGBA1C 7.2 (H) 02/17/2024    Screening for Secondary Hypertension:      02/20/2023    3:11 PM  Causes  Drugs/Herbals Screened     -  Comments Limits sodium intake.  Quit smoking 2023.  Drinks 3 cups of coffee daily.  No NSAIDS  Renovascular HTN Screened     - Comments Check renal Dopplers  Sleep Apnea Screened     - Comments No symptoms  Thyroid  Disease Screened  Hyperaldosteronism Screened     - Comments Check renin and aldosterone  Pheochromocytoma N/A  Cushing's Syndrome N/A  Hyperparathyroidism Screened  Coarctation of the Aorta Screened     - Comments BP symmetric  Compliance Screened    Relevant Labs/Studies:    Latest Ref Rng & Units 02/17/2024    2:14 PM 10/07/2023    3:44 PM 07/09/2023    3:29 PM  Basic Labs  Sodium 134 - 144 mmol/L 138  139  137   Potassium 3.5 - 5.2 mmol/L 3.9  4.0  4.2   Creatinine 0.57 - 1.00 mg/dL 7.82  9.56  2.13                  04/12/2023    3:35 PM  Renovascular   Renal Artery US  Completed Yes      Home Medications    Current Outpatient Medications  Medication  Sig Dispense Refill   aspirin  EC 81 MG tablet Take 81 mg by mouth daily.     atorvastatin  (LIPITOR) 40 MG tablet Take 1 tablet (40 mg total) by mouth daily. 90 tablet 3   carvedilol  (COREG ) 12.5 MG tablet Take 1 tablet (12.5 mg total) by mouth 2 (two) times daily. 180 tablet 1   ENTRESTO  97-103 MG TAKE 1 TABLET BY MOUTH TWICE DAILY 60 tablet 6   ferrous gluconate  (FERGON) 324 MG tablet Take 1 tablet (324 mg total) by mouth daily with breakfast. 90 tablet 3   furosemide  (LASIX ) 40 MG tablet Take 1 tablet (40 mg total) by mouth daily. 90 tablet 3   glipiZIDE  (GLUCOTROL ) 5 MG tablet Take 1 tablet (5 mg total) by mouth 2 (two) times daily before a meal. (Patient taking differently: Take 5 mg by mouth daily before breakfast.) 180 tablet 1   hydrALAZINE  (APRESOLINE ) 100 MG tablet Take 1 tablet (100 mg total) by mouth 3 (three) times daily. 270 tablet 3   isosorbide  mononitrate (IMDUR ) 60 MG 24 hr tablet Take 1 tablet (60 mg total) by mouth daily. 30 tablet 11   leflunomide  (ARAVA ) 20 MG tablet Take 1 tablet (20 mg total) by mouth daily. 90 tablet 0   linagliptin  (TRADJENTA ) 5 MG TABS tablet Take 1 tablet (5 mg total) by mouth daily. 90 tablet 1   spironolactone  (ALDACTONE ) 50 MG tablet Take 1 tablet (50 mg total) by mouth daily. 60 tablet 3   latanoprost (XALATAN) 0.005 % ophthalmic solution Place 1 drop into both eyes at bedtime. (Patient not taking: Reported on 03/17/2024)     No current facility-administered medications for this visit.     Assessment & Plan   HYPERTENSION CONTROL Vitals:   03/17/24 1331 03/17/24 1337  BP: (!) 191/84 (!) 191/92    The patient's blood pressure is elevated above target today.  In order to address the patient's elevated BP: A current anti-hypertensive medication was adjusted today.; Blood pressure will be monitored at home to determine if medication changes need to be made.      Essential hypertension, benign Assessment: BP is uncontrolled in office BP  191/92 mmHg;  above the goal (<130/80). No home readings with her but notes range 128-151 systolic Tolerates Entresto  97/103 bid, hydralazine  100 mg tid, spironolactone   50 mg every day, carvedilol  12.5 mg bid, imdur  60 mg every day  well without any side effects Denies SOB, palpitation, chest pain, headaches,or swelling Reiterated the importance of regular exercise and low salt diet   Plan:  Increase spironolactone  to 50 mg twice daily  Continue taking Entresto  97/103 bid, hydralazine  100 mg tid, carvedilol  12.5 mg bid, imdur  60 mg every day  Patient to keep record of BP readings with heart rate and report to us  at the next visit Patient to follow up with me in 6 weeks - at [facility]. office  Labs ordered today:  BMET in 2 weeks Recently received 3 month supply of meds, will plan for adherence packaging at next visit.    Kyleeann Cremeans PharmD CPP [facility]  [address] [place], [place] [address] [phone]

## 2024-03-17 NOTE — Patient Instructions (Addendum)
 Follow up appointment: Monday June 23 at 3:15 pm at the [facility] office   Go to the lab in 2 weeks to check potassium - week of May 12  Take your BP meds as follows:  Increase spironolactone  50 mg to twice daily (morning and night).  Continue with all other medications.   Check your blood pressure at home daily (if able) and keep record of the readings.  Your blood pressure goal is < 130/80  To check your pressure at home you will need to:  1. Sit up in a chair, with feet flat on the floor and back supported. Do not cross your ankles or legs. 2. Rest your left arm so that the cuff is about heart level. If the cuff goes on your upper arm,  then just relax the arm on the table, arm of the chair or your lap. If you have a wrist cuff, we  suggest relaxing your wrist against your chest (think of it as Pledging the Flag with the  wrong arm).  3. Place the cuff snugly around your arm, about 1 inch above the crook of your elbow. The  cords should be inside the groove of your elbow.  4. Sit quietly, with the cuff in place, for about 5 minutes. After that 5 minutes press the power  button to start a reading. 5. Do not talk or move while the reading is taking place.  6. Record your readings on a sheet of paper. Although most cuffs have a memory, it is often  easier to see a pattern developing when the numbers are all in front of you.  7. You can repeat the reading after 1-3 minutes if it is recommended  Make sure your bladder is empty and you have not had caffeine or tobacco within the last 30 min  Always bring your blood pressure log with you to your appointments. If you have not brought your monitor in to be double checked for accuracy, please bring it to your next appointment.  You can find a list of quality blood pressure cuffs at [email or web address]  Important lifestyle changes to control high blood pressure  Intervention  Effect on the BP  Lose extra pounds and watch your  waistline Weight loss is one of the most effective lifestyle changes for controlling blood pressure. If you're overweight or obese, losing even a small amount of weight can help reduce blood pressure. Blood pressure might go down by about 1 millimeter of mercury (mm Hg) with each kilogram (about 2.2 pounds) of weight lost.  Exercise regularly As a general goal, aim for at least 30 minutes of moderate physical activity every day. Regular physical activity can lower high blood pressure by about 5 to 8 mm Hg.  Eat a healthy diet Eating a diet rich in whole grains, fruits, vegetables, and low-fat dairy products and low in saturated fat and cholesterol. A healthy diet can lower high blood pressure by up to 11 mm Hg.  Reduce salt (sodium) in your diet Even a small reduction of sodium in the diet can improve heart health and reduce high blood pressure by about 5 to 6 mm Hg.  Limit alcohol One drink equals 12 ounces of beer, 5 ounces of wine, or 1.5 ounces of 80-proof liquor.  Limiting alcohol to less than one drink a day for women or two drinks a day for men can help lower blood pressure by about 4 mm Hg.   If you have any questions or  concerns please use My Chart to send questions or call the office at [phone]

## 2024-04-01 DIAGNOSIS — I1A0 Resistant hypertension: Secondary | ICD-10-CM | POA: Diagnosis not present

## 2024-04-02 LAB — BASIC METABOLIC PANEL WITH GFR
BUN/Creatinine Ratio: 13 (ref 12–28)
BUN: 18 mg/dL (ref 8–27)
CO2: 20 mmol/L (ref 20–29)
Calcium: 8.8 mg/dL (ref 8.7–10.3)
Chloride: 105 mmol/L (ref 96–106)
Creatinine, Ser: 1.36 mg/dL — ABNORMAL HIGH (ref 0.57–1.00)
Glucose: 221 mg/dL — ABNORMAL HIGH (ref 70–99)
Potassium: 4.9 mmol/L (ref 3.5–5.2)
Sodium: 142 mmol/L (ref 134–144)
eGFR: 43 mL/min/{1.73_m2} — ABNORMAL LOW (ref >59)

## 2024-04-06 ENCOUNTER — Ambulatory Visit (HOSPITAL_BASED_OUTPATIENT_CLINIC_OR_DEPARTMENT_OTHER): Payer: Self-pay | Admitting: Cardiovascular Disease

## 2024-05-11 ENCOUNTER — Ambulatory Visit: Payer: Self-pay | Admitting: Family Medicine

## 2024-05-11 ENCOUNTER — Ambulatory Visit: Admitting: Family Medicine

## 2024-05-11 ENCOUNTER — Encounter: Payer: Self-pay | Admitting: Family Medicine

## 2024-05-11 ENCOUNTER — Ambulatory Visit: Admitting: Pharmacist Clinician (PhC)/ Clinical Pharmacy Specialist

## 2024-05-11 VITALS — BP 130/80 | HR 88 | Wt 162.2 lb

## 2024-05-11 DIAGNOSIS — N1831 Chronic kidney disease, stage 3a: Secondary | ICD-10-CM | POA: Diagnosis not present

## 2024-05-11 DIAGNOSIS — M62838 Other muscle spasm: Secondary | ICD-10-CM

## 2024-05-11 DIAGNOSIS — M546 Pain in thoracic spine: Secondary | ICD-10-CM | POA: Diagnosis not present

## 2024-05-11 DIAGNOSIS — I10 Essential (primary) hypertension: Secondary | ICD-10-CM | POA: Diagnosis not present

## 2024-05-11 DIAGNOSIS — R8290 Unspecified abnormal findings in urine: Secondary | ICD-10-CM | POA: Diagnosis not present

## 2024-05-11 LAB — POCT URINE DIPSTICK
Bilirubin, UA: NEGATIVE
Blood, UA: NEGATIVE
Glucose, UA: NEGATIVE mg/dL
Ketones, POC UA: NEGATIVE mg/dL
Nitrite, UA: NEGATIVE
POC PROTEIN,UA: 30 — AB
Spec Grav, UA: 1.01 (ref 1.010–1.025)
Urobilinogen, UA: 0.2 U/dL
pH, UA: 5 (ref 5.0–8.0)

## 2024-05-11 MED ORDER — METHOCARBAMOL 500 MG PO TABS: 500.0000 mg | ORAL_TABLET | Freq: Three times a day (TID) | ORAL | 0 refills | Status: DC | PRN

## 2024-05-11 NOTE — Progress Notes (Signed)
 Talked to patient.

## 2024-05-11 NOTE — Progress Notes (Signed)
 Chief Complaint  Patient presents with   Acute Visit    Middle back pain. Just started on Friday 6/20 started getting very intense. Pain scale of a 10 today.    Over the weekend she started with right sided flank and LBP, and is worse today.  She had been having back pain, she thought it might have been her mattress. Got a new mattress, got better for a bit, but got worse again over the weekend. She did her usual cleaning (that she does weekly). Thinks maybe she tweaked something when she was trying to make up her bed.  She took Aleve twice daily over the weekend (none today).  It seemed to help ease it off.  Pain is at R flank and lower back. Denies any radiation (maybe sometimes to the stomach). Nothing into the leg No numbness or tingling, denies any weakness. Denies bowel or bladder problems. No hematuria, dysuria. No known rash on back.    PMH, PSH, SH reviewed DM, HTN, HLD, CHF, RA Cr 1.36, GFR 43 in 03/2024 Lab Results  Component Value Date   HGBA1C 7.2 (H) 02/17/2024     ROS: no HA, dizziness. No bleeding, bruising, abdominal pain. No f/c, URI symptoms. Some congestion/postnasal drainage. No cough, shortness of breath, edema, chest pain.    PHYSICAL EXAM:  BP (!) 140/90   Pulse 88   Wt 162 lb 3.2 oz (73.6 kg)   SpO2 100%   BMI 28.73 kg/m  Repeat BP 130/80 Pleasant female, in good spirits, with a hoarse voice, occasional throat clearing She was in moderate discomfort with certain movements and with palpation during the exam. HEENT: EOMI. Neck:no lymphadenopathy or mass Heart: regular rate and rhythm Lungs: clear bilaterally Back: no spinal tenderness. Poss mild R CVA tenderness. Very tender at R paraspinous muscles, lower thoracic/upper lumbar, with some tenderness more laterally. Slightly tender at inferior ribs and muscles below this. Extremities: no edema. Many soft tissue masses--hands, wrists, forearms Neuro: normal DTRs, strength, gait   Urine:  SG 1.010, 2+ leuks, protein, ow/ normal. Negative for blood.  ASSESSMENT/PLAN:   Acute right-sided thoracic back pain - lower thoracic and upper lumbar pain, and also R flank and laterally at waist. Suspect muscle strain - Plan: POCT URINE DIPSTICK, Urine Culture  Muscle spasm - Plan: methocarbamol  (ROBAXIN ) 500 MG tablet  Abnormal urinalysis - will send for culture given some discomfort at R flank; pain seems muscular, and denies urinary sx. Treat based on culture results  Essential hypertension, benign - controlled on current regimen  Stage 3a chronic kidney disease (HCC) - to avoid NSAIDs. Reviewed safe alternatives for pain control (voltaren gel, tylenol , lidocaine  patches), heat.  Urine specimen was given just prior to leaving visit--pt advised of results and plan to send culture, but otherwise continue with plan as discussed.  We will contact her if abnormal culture that requires tx   I spent 33 minutes dedicated to the care of this patient, including pre-visit review of records, face to face time, post-visit ordering of testing and documentation.  I suspect that your pain is related to a muscle strain/spasm. We discussed other options as well--contact us  if symptoms change (especially if you develop a rash, which could indicate shingles).  I encourage you to use a heating pad (or patches such as Thermacare). You can use topical voltaren gel (to avoid causing any harm to the kidneys from oral anti-inflammatories). You can also use lidocaine  patches (such as SalonPas with lidocaine ). You may also use  tylenol , in addition to these other measures, if needed for pain. If needed, you can try the prescribed muscle relaxant.  Take 1-2 pills every 8 hours, as needed.  This can sometimes make people a little sleepy, so use with caution if driving.

## 2024-05-11 NOTE — Patient Instructions (Addendum)
 I suspect that your pain is related to a muscle strain/spasm. We discussed other options as well--contact us  if symptoms change (especially if you develop a rash, which could indicate shingles).  I encourage you to use a heating pad (or patches such as Thermacare).  Do some gentle stretches of the area. You can use topical voltaren gel (to avoid causing any harm to the kidneys from oral anti-inflammatories). You can also use lidocaine  patches (such as SalonPas with lidocaine ). You may also use tylenol , in addition to these other measures, if needed for pain. If needed, you can try the prescribed muscle relaxant.  Take 1-2 pills every 8 hours, as needed.  This can sometimes make people a little sleepy, so use with caution if driving.

## 2024-05-11 NOTE — Progress Notes (Deleted)
 Office Visit    Patient Name: Maria Foster Date of Encounter: 05/11/2024  Primary Care Provider:  Bulah Alm RAMAN, PA-C Primary Cardiologist:  Annabella Scarce, MD  Chief Complaint    Hypertension - Advanced hypertension clinic  Past Medical History   DM2 3/25 A1c 7.25  CKD Stage 3 GFR 49, SCr 1.21  CAD Occluded RCA with collaterals, 80% mLCx  CHF 1823 new onset; most recent echo at 45-50$  RAS 4/24 Left 1-59% stenosis, repeat in 1 year  OSA Did not tolerate CPAP  PAD Moderate disease by ABI's    Allergies  Allergen Reactions   Metformin  And Related Other (See Comments)    Kidney injury, GI upset, diarrhea   Farxiga  [Dapagliflozin ] Other (See Comments)    Yeast infections, recurrent    History of Present Illness    Maria Foster is a 68 y.o. female patient who was referred to the [facility] by Alm Bulah PA.  She was most recently seen about 3 weeks ago by [facility], with a BP of 148/92. At that visit she complained clonidine  and carvedilol  caused her to be groggy and had stopped both.  She had also been out of spironolactone , atorvastatin  and furosemide  for about 2 weeks.  Noted that home BP readings were 120-130 range, but in clinic that day was 168/120  initially, dropping to 148/92.  Adherence packaging was discussed, patient was going to consider.  Because she just picked up a 3 month supply of most of her medications, will defer that until next visit.  Spironolactone  was increased to 50 mg daily and BMET showed mild bump in SCr.  Ms Sebring returns today for follow up.   Blood Pressure Goal:  130/80  Current Medications: Entresto  97/103 bid, hydralazine  100 mg tid, spironolactone  50 mg every day, carvedilol  12.5 mg bid, imdur  60 mg every day   Previously tried:  amlodipine  - HF, recovered LVEF (use only if necessary)   Family Hx:  mother had hypertension; had heart issues, died at 60; no hx of father; 1 sister with hypertension (8  siblings)   Social Hx:                 Tobacco: no - quit             Alcohol: no             Caffeine:  switched to decaf since last visit; occasional Coke; mostly water    Diet: cooks most meal, no added salt mostly (few with cooking); lots of fish and chicken, rare pork, some beef; vegetables usually frozen; not snakcing      Exercise: exercise bike, treadmill, yoga mat, stretch bands; about 45 minutes per day - created new exercise room in the house; she and husband are committed to daily exercise to help with weight management.    Home BP readings: no readings with her but notes highest was 128-151/75-80    Accessory Clinical Findings    Lab Results  Component Value Date   CREATININE 1.36 (H) 04/01/2024   BUN 18 04/01/2024   NA 142 04/01/2024   K 4.9 04/01/2024   CL 105 04/01/2024   CO2 20 04/01/2024   Lab Results  Component Value Date   ALT 8 02/17/2024   AST 13 02/17/2024   ALKPHOS 130 (H) 02/17/2024   BILITOT 0.4 02/17/2024   Lab Results  Component Value Date   HGBA1C 7.2 (H) 02/17/2024    Screening for Secondary  Hypertension:      02/20/2023    3:11 PM  Causes  Drugs/Herbals Screened     - Comments Limits sodium intake.  Quit smoking 2023.  Drinks 3 cups of coffee daily.  No NSAIDS  Renovascular HTN Screened     - Comments Check renal Dopplers  Sleep Apnea Screened     - Comments No symptoms  Thyroid  Disease Screened  Hyperaldosteronism Screened     - Comments Check renin and aldosterone  Pheochromocytoma N/A  Cushing's Syndrome N/A  Hyperparathyroidism Screened  Coarctation of the Aorta Screened     - Comments BP symmetric  Compliance Screened    Relevant Labs/Studies:    Latest Ref Rng & Units 04/01/2024    2:45 PM 02/17/2024    2:14 PM 10/07/2023    3:44 PM  Basic Labs  Sodium 134 - 144 mmol/L 142  138  139   Potassium 3.5 - 5.2 mmol/L 4.9  3.9  4.0   Creatinine 0.57 - 1.00 mg/dL 8.63  8.78  8.86                  03/18/2023   10:24 AM   Renovascular   Renal Artery US  Completed Yes      Home Medications    Current Outpatient Medications  Medication Sig Dispense Refill   aspirin  EC 81 MG tablet Take 81 mg by mouth daily.     atorvastatin  (LIPITOR) 40 MG tablet Take 1 tablet (40 mg total) by mouth daily. 90 tablet 3   carvedilol  (COREG ) 12.5 MG tablet Take 1 tablet (12.5 mg total) by mouth 2 (two) times daily. 180 tablet 1   ENTRESTO  97-103 MG TAKE 1 TABLET BY MOUTH TWICE DAILY 60 tablet 6   ferrous gluconate  (FERGON) 324 MG tablet Take 1 tablet (324 mg total) by mouth daily with breakfast. 90 tablet 3   furosemide  (LASIX ) 40 MG tablet Take 1 tablet (40 mg total) by mouth daily. 90 tablet 3   glipiZIDE  (GLUCOTROL ) 5 MG tablet Take 1 tablet (5 mg total) by mouth 2 (two) times daily before a meal. (Patient taking differently: Take 5 mg by mouth daily before breakfast.) 180 tablet 1   hydrALAZINE  (APRESOLINE ) 100 MG tablet Take 1 tablet (100 mg total) by mouth 3 (three) times daily. 270 tablet 3   isosorbide  mononitrate (IMDUR ) 60 MG 24 hr tablet Take 1 tablet (60 mg total) by mouth daily. 30 tablet 11   latanoprost (XALATAN) 0.005 % ophthalmic solution Place 1 drop into both eyes at bedtime. (Patient not taking: Reported on 03/17/2024)     leflunomide  (ARAVA ) 20 MG tablet Take 1 tablet (20 mg total) by mouth daily. 90 tablet 0   linagliptin  (TRADJENTA ) 5 MG TABS tablet Take 1 tablet (5 mg total) by mouth daily. 90 tablet 1   spironolactone  (ALDACTONE ) 50 MG tablet Take 1 tablet (50 mg total) by mouth daily. 60 tablet 3   No current facility-administered medications for this visit.     Assessment & Plan   No BP recorded.  {Refresh Note OR Click here to enter BP  :1}***   No problem-specific Assessment & Plan notes found for this encounter.   Darel Ricketts PharmD CPP [facility]  [address] [place], [place] [address] [phone]

## 2024-05-13 ENCOUNTER — Encounter (HOSPITAL_COMMUNITY): Admitting: Internal Medicine

## 2024-05-13 ENCOUNTER — Other Ambulatory Visit (HOSPITAL_COMMUNITY)

## 2024-05-15 ENCOUNTER — Other Ambulatory Visit: Payer: Self-pay

## 2024-05-15 LAB — URINE CULTURE

## 2024-05-15 MED ORDER — SULFAMETHOXAZOLE-TRIMETHOPRIM 800-160 MG PO TABS: 1.0000 | ORAL_TABLET | Freq: Two times a day (BID) | ORAL | 0 refills | Status: DC

## 2024-05-18 NOTE — Progress Notes (Deleted)
 Office Visit Note  Patient: Maria Foster             Date of Birth: [birth year]           MRN: [id number]             PCP: Bulah Alm RAMAN, PA-C Referring: Bulah Alm RAMAN, PA-C Visit Date: 06/01/2024   Subjective:  No chief complaint on file.   History of Present Illness: Maria Foster is a 68 y.o. female here for follow up for seropositive nodular rheumatoid arthritis on leflunomide  20 mg p.o. daily.    Previous HPI 03/02/2024 Maria Foster is a 68 y.o. female here for follow up for seropositive nodular rheumatoid arthritis on leflunomide  20 mg p.o. daily.     She experiences a stinging sensation in a red area on her skin, which acts up intermittently and began bothering her last night. She speculates that it might be related to something she ate, specifically mentioning strawberry ice cream, although she is not allergic to strawberries and typically does not consume much sugar.   She recalls a previous issue with her left index finger, which was getting stuck and causing pain. She received a corticosteroid injection for this problem during her last visit, and since then, she has not experienced any further issues, indicating improvement.   She is currently taking leflunomide  and reports no stomach trouble associated with it. She was recently prescribed carvedilol  for her heart, but it causes her to feel dizzy and lightheaded, leading to a reduction in dosage. Despite the adjustment, she continues to experience these side effects, describing a sensation of being 'drunk'.   Regarding her joints, she experiences occasional aches, particularly when it rains, but finds relief through exercise. No increased swelling or weakness in her legs. She occasionally takes over-the-counter allergy medications like Zyrtec (cetirizine) 10 mg at night to avoid drowsiness during the day while caring for her four-year-old great-grandchild.      Previous HPI 10/07/2023 Maria Foster is a 68  y.o. female here for follow up for seropositive nodular rheumatoid arthritis on leflunomide  20 mg p.o. daily.  Overall most of her symptoms are doing well and has not noticed either increase nor decrease in the nodules.  Current worst complaint is pain at her left index finger.  This hurts at rest and with use and she notices more frequently if the finger getting stuck and some decrease in ability to close her hand.  Not associated with any change in sensation or loss of grip strength.   Previous HPI 07/09/2023 Maria Foster is a 68 y.o. female here for follow up for seropositive nodular rheumatoid arthritis on leflunomide  20 mg p.o. daily.  She has been doing well without any major flareup without significant joint pain or swelling until the past week.  She did a a lot of cleaning around the house including scrubbing of kitchen back splash and afterwards developed increased pain and swelling in the right wrist.  So far she is not taken anything extra or tried using her wrist band that she sometimes wears in the past for pain relief.   Previous HPI 04/08/2023 Maria Foster is a 69 y.o. female here for follow up for seropositive nodular rheumatoid arthritis on leflunomide  20 mg p.o. daily.  Has not seen much additional change in the hand and elbow nodules.  Not having significant hand pain or stiffness most days.  She noticed some worsening in her knee pain during recent rainy  weather but on other days has no symptoms.  She feels she is noticing some gassiness or bloating with the leflunomide  but not too severe and is still taking the medicine as directed.  She had recent labs checked in April which we reviewed her blood count is normal and metabolic panel shows estimated GFR of 51 which is stable for her baseline.   Previous HPI 12/24/22 Maria Foster is a 68 y.o. female here for follow up for seropositive nodular RA on leflunomide  20 mg p.o. daily.  Unfortunately had interruption with starting due to  hospital admission for systemic inflammation starting from urinary tract infection.  Joint pain symptoms have not changed significantly.  She is having issue with ongoing nonproductive cough.  Also with frequent eye watering and some irritation no vision changes and no associated swelling.  She has upcoming evaluations for her lungs and congestive heart failure.   Previous HPI 09/26/22 Maria Foster is a 68 y.o. female here for follow up for seropositive RA we did not start methotrexate as planned due to renal impairment worse than baseline checked at clinic appointment.  Symptoms remain about the same and also not seeing any worsening with stopping hydroxychloroquine .   Previous HPI 07/02/2022 Maria Foster is a 68 y.o. female here for follow up seropositive RA on hydroxychloroquine  200 mg daily.  She feels like symptoms have made no significant difference on this treatment.  No recurrence of severe flareups like she had in her elbow either.  She does notice a few new and expanding nodules with episodes of spontaneous rupturing and white fluid drainage. Does not have much joint pain or swelling outside of the nodules.   Previous HPI 01/01/2022 Maria Foster is a 68 y.o. female here for follow up for seropositive RA on HCQ 200 mg daily. After our last visit with right elbow injection she developed worsening pain and then purulent drainage found to be MSSA infection of that right olecranon bursa and went to the hospital for surgical debridement of the wound. Wound culture was obtained also surgical report of rheumatoid nodule at the site. She took a course of antibiotics with PICC line completed without incident. She resumed hydroxychloroquine  after this and she feels her joint pain is very well controlled at this time. She notices some increase in stiffness and pain with cold and changing weather.   Previous HPI 07/03/21 Maria Foster is a 68 y.o. female here for follow up for seropositive RA and  chronic tophaceous gout. Recommended starting hydroxychloroquine  treatment 200 mg PO daily for suspected RA as active disease and normal uric levels. She feels her symptoms are improved since starting this, particularly states pain with weather and temperature changes are decreased. She has right elbow pain more than elsewhere says this hurts mostly due to pressure on the site when lying in bed or certain seated positions.   Previous HPI: 03/29/21 Maria Foster is a 68 y.o. female referred here for evaluation of rheumatoid arthritis.  She has a longstanding history of bilateral hand and knee pain that has been going on for years with some progressive worsening and deformity over time.  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  She also has chronic knee pain and stiffness bilaterally without any large nodular changes.  She does  not have much joint pain most days however experiences some episodically and also feels that her hands and knees hurt worse with cold and rainy weather.  She describes a history of rheumatoid arthritis but has never been on treatment plans for this.  She states she has never had any episode of gout involving the toes or feet but has been told by her cardiologist this is a problem that she has.   She does not recall ever undergoing bone density screening for osteoporosis. She has a long smoking history but in recent months is either not smoking or very reduced usage most days.   No Rheumatology ROS completed.   PMFS History:  Patient Active Problem List   Diagnosis Date Noted   Postmenopausal estrogen deficiency 12/16/2023   Former smoker 12/16/2023   White coat syndrome with hypertension 11/09/2022   Cystitis 10/31/2022   Hypertensive urgency 10/30/2022   Hypokalemia 10/30/2022    Hypomagnesemia 10/30/2022   Cough 10/30/2022   Influenza-like illness 10/29/2022   Acute cystitis without hematuria 10/29/2022   Pseudophakia, both eyes 01/29/2022   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 type 2 diabetes mellitus (HCC) 11/16/2021   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 2 diabetes mellitus (HCC) 11/16/2021   OSA (obstructive sleep apnea) 08/25/2021   Iron deficiency 08/25/2021   Screening for cancer 08/25/2021   Influenza vaccination declined 08/25/2021   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   Stage 3a chronic kidney disease (HCC) 07/11/2021   Essential hypertension, benign 05/04/2021   Hyperlipidemia associated with type 2 diabetes mellitus (HCC) 05/04/2021   Enlarged and hypertrophic nails 05/04/2021   Encounter for screening mammogram for malignant neoplasm of breast 05/04/2021   Need for pneumococcal vaccination 05/04/2021   CHF (congestive heart failure) (HCC) 05/04/2021   Stable angina (HCC) 05/04/2021   Vaccine counseling 05/04/2021   High risk medication use 03/29/2021   Screen for colon cancer 01/27/2021   Tophi 01/11/2021   Rheumatoid arthritis involving both hands (HCC) 01/11/2021   Elevated troponin    Type 2 diabetes mellitus (HCC) 12/19/2020   Constipation 12/19/2020    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   OU progressive, will medically need to have cataract surgery with lens will implantation so as to allow for complete clearance of the vitreous cavity in anticipation of likely vitrectomy in the future for progressive PDR despite previous PRP as delineated by the E DRS series of reports   Diabetes mellitus type 2 with complications (HCC)    diagnosed probably 2012 per patient   Diabetic retinopathy (HCC)    Hypertension    Rheumatoid arthritis (HCC)    Septic olecranon bursitis of right elbow  07/11/2021    Family History  Problem Relation Age of Onset   Heart attack Mother    Heart failure Mother    Diabetes Mother    Hypertension Mother    Heart failure Sister    Heart Problems Sister    Heart failure Sister    Hypertension Daughter    Amblyopia Neg Hx    Blindness Neg Hx    Cataracts Neg Hx    Glaucoma Neg Hx    Macular degeneration Neg Hx    Retinal detachment Neg Hx    Strabismus Neg Hx    Retinitis pigmentosa Neg Hx    Past Surgical History:  Procedure Laterality Date   INCISION  AND DRAINAGE ABSCESS N/A 07/11/2021   Procedure: INCISION AND DRAINAGE RIGHT ELBOW;  Surgeon: Ernie Cough, MD;  Location: [facility];  Service: Orthopedics;  Laterality: N/A;   PRP Bilateral OD - 07/23/2017 OS - 08/06/2017   RIGHT/LEFT HEART CATH AND CORONARY ANGIOGRAPHY N/A 01/05/2021   Procedure: RIGHT/LEFT HEART CATH AND CORONARY ANGIOGRAPHY;  Surgeon: Cherrie Toribio SAUNDERS, MD;  Location: [facility];  Service: Cardiovascular;  Laterality: N/A;   TEE WITHOUT CARDIOVERSION N/A 10/09/2021   Procedure: TRANSESOPHAGEAL ECHOCARDIOGRAM (TEE);  Surgeon: Cherrie Toribio SAUNDERS, MD;  Location: [facility];  Service: Cardiovascular;  Laterality: N/A;   Social History   Social History Narrative   Not on file   Immunization History  Administered Date(s) Administered   PFIZER(Purple Top)SARS-COV-2 Vaccination 02/09/2020, 03/01/2020, 11/05/2020   PNEUMOCOCCAL CONJUGATE-20 12/16/2023   Pfizer(Comirnaty)Fall Seasonal Vaccine 12 years and older 12/14/2022   Pneumococcal Polysaccharide-23 05/04/2021   Tdap 07/11/2021     Objective: Vital Signs: There were no vitals taken for this visit.   Physical Exam   Musculoskeletal Exam: ***  CDAI Exam: CDAI Score: -- Patient Global: --; Provider Global: -- Swollen: --; Tender: -- Joint Exam 06/01/2024   No joint exam has been documented for this visit   There is currently no information documented on the homunculus. Go to the Rheumatology  activity and complete the homunculus joint exam.  Investigation: No additional findings.  Imaging: No results found.  Recent Labs: Lab Results  Component Value Date   WBC 6.9 02/17/2024   HGB 11.3 02/17/2024   PLT 167 02/17/2024   NA 142 04/01/2024   K 4.9 04/01/2024   CL 105 04/01/2024   CO2 20 04/01/2024   GLUCOSE 221 (H) 04/01/2024   BUN 18 04/01/2024   CREATININE 1.36 (H) 04/01/2024   BILITOT 0.4 02/17/2024   ALKPHOS 130 (H) 02/17/2024   AST 13 02/17/2024   ALT 8 02/17/2024   PROT 7.0 02/17/2024   ALBUMIN 4.0 02/17/2024   CALCIUM  8.8 04/01/2024   GFRAA >60 11/29/2016    Speciality Comments: No specialty comments available.  Procedures:  No procedures performed Allergies: Metformin  and related and Farxiga  [dapagliflozin ]   Assessment / Plan:     Visit Diagnoses: No diagnosis found.  ***  Orders: No orders of the defined types were placed in this encounter.  No orders of the defined types were placed in this encounter.    Follow-Up Instructions: No follow-ups on file.   Shelba SHAUNNA Potters, RT  Note - This record has been created using [employer].  Chart creation errors have been sought, but may not always  have been located. Such creation errors do not reflect on  the standard of medical care.

## 2024-06-01 ENCOUNTER — Ambulatory Visit: Admitting: Internal Medicine

## 2024-06-01 ENCOUNTER — Other Ambulatory Visit: Payer: Self-pay | Admitting: Internal Medicine

## 2024-06-01 DIAGNOSIS — Z79899 Other long term (current) drug therapy: Secondary | ICD-10-CM

## 2024-06-01 DIAGNOSIS — M05741 Rheumatoid arthritis with rheumatoid factor of right hand without organ or systems involvement: Secondary | ICD-10-CM

## 2024-06-01 DIAGNOSIS — M79645 Pain in left finger(s): Secondary | ICD-10-CM

## 2024-06-01 DIAGNOSIS — I10 Essential (primary) hypertension: Secondary | ICD-10-CM

## 2024-06-04 ENCOUNTER — Other Ambulatory Visit (HOSPITAL_BASED_OUTPATIENT_CLINIC_OR_DEPARTMENT_OTHER): Payer: Self-pay | Admitting: *Deleted

## 2024-06-23 ENCOUNTER — Ambulatory Visit: Attending: Cardiovascular Disease | Admitting: Pharmacist Clinician (PhC)/ Clinical Pharmacy Specialist

## 2024-06-23 ENCOUNTER — Encounter: Payer: Self-pay | Admitting: Pharmacist Clinician (PhC)/ Clinical Pharmacy Specialist

## 2024-06-23 VITALS — BP 134/62 | Ht 63.0 in | Wt 161.0 lb

## 2024-06-23 DIAGNOSIS — I10 Essential (primary) hypertension: Secondary | ICD-10-CM

## 2024-06-23 NOTE — Progress Notes (Signed)
 Office Visit    Patient Name: Maria Foster Date of Encounter: 06/23/2024  Primary Care Provider:  Bulah Alm RAMAN, PA-C Primary Cardiologist:  Annabella Scarce, MD  Chief Complaint    Hypertension - Advanced hypertension clinic  Past Medical History   DM2 3/25 A1c 7.25  CKD Stage 3 GFR 49, SCr 1.21  CAD Occluded RCA with collaterals, 80% mLCx  CHF 1823 new onset; most recent echo at 45-50$  RAS 4/24 Left 1-59% stenosis, repeat in 1 year  OSA Did not tolerate CPAP  PAD Moderate disease by ABI's    Allergies  Allergen Reactions   Metformin  And Related Other (See Comments)    Kidney injury, GI upset, diarrhea   Farxiga  [Dapagliflozin ] Other (See Comments)    Yeast infections, recurrent    History of Present Illness    Maria Foster is a 68 y.o. female patient who was referred to the Advanced Hypertension Clinic by Alm Bulah PA.  She was most recently seen about 3 weeks ago by Reche Finder, with a BP of 148/92. At that visit she complained clonidine  and carvedilol  caused her to be groggy and had stopped both.  She had also been out of spironolactone , atorvastatin  and furosemide  for about 2 weeks.  Noted that home BP readings were 120-130 range, but in clinic that day was 168/120  initially, dropping to 148/92.  Adherence packaging was discussed, patient was going to consider.  Because she just picked up a 3 month supply of most of her medications, will defer that until next visit.  Spironolactone  was increased to 50 mg twice daily and follow up labs showed bump in SCr, but just over 10%.  Potassium at 4.9  Today she returns for follow up.  There was some confusion after last visit and she is still taking spironolactone  50 mg once daily.   Has checked home BP occasionally, still sees occasional readings up to 170, some as low as 130 systolic.  Continues to exercise with her husband and watch their diet to be low sodium and more heart healthy.   Blood Pressure Goal:   130/80  Current Medications: Entresto  97/103 bid, hydralazine  100 mg tid, spironolactone  50 mg qd  carvedilol  12.5 mg bid, imdur  60 mg every day   Previously tried:  amlodipine  - HF, recovered LVEF (use only if necessary)   Family Hx:  mother had hypertension; had heart issues, died at 16; no hx of father; 1 sister with hypertension (8 siblings)   Social Hx:                 Tobacco: no - quit             Alcohol: no             Caffeine:  no more coffee. occasional Coke; mostly water    Diet: cooks most meal, no added salt mostly (few with cooking); lots of fish and chicken, rare pork, some beef; vegetables usually frozen; not snakcing      Exercise: exercise bike, treadmill, yoga mat, stretch bands; about 45 minutes per day - created new exercise room in the house; she and husband are committed to daily exercise to help with weight management.    Home BP readings: no readings with her but notes range of 130-170/70's  Accessory Clinical Findings    Lab Results  Component Value Date   CREATININE 1.36 (H) 04/01/2024   BUN 18 04/01/2024   NA 142 04/01/2024   K  4.9 04/01/2024   CL 105 04/01/2024   CO2 20 04/01/2024   Lab Results  Component Value Date   ALT 8 02/17/2024   AST 13 02/17/2024   ALKPHOS 130 (H) 02/17/2024   BILITOT 0.4 02/17/2024   Lab Results  Component Value Date   HGBA1C 7.2 (H) 02/17/2024    Screening for Secondary Hypertension:      02/20/2023    3:11 PM  Causes  Drugs/Herbals Screened     - Comments Limits sodium intake.  Quit smoking 2023.  Drinks 3 cups of coffee daily.  No NSAIDS  Renovascular HTN Screened     - Comments Check renal Dopplers  Sleep Apnea Screened     - Comments No symptoms  Thyroid  Disease Screened  Hyperaldosteronism Screened     - Comments Check renin and aldosterone  Pheochromocytoma N/A  Cushing's Syndrome N/A  Hyperparathyroidism Screened  Coarctation of the Aorta Screened     - Comments BP symmetric  Compliance  Screened    Relevant Labs/Studies:    Latest Ref Rng & Units 04/01/2024    2:45 PM 02/17/2024    2:14 PM 10/07/2023    3:44 PM  Basic Labs  Sodium 134 - 144 mmol/L 142  138  139   Potassium 3.5 - 5.2 mmol/L 4.9  3.9  4.0   Creatinine 0.57 - 1.00 mg/dL 8.63  8.78  8.86                  03/18/2023   10:24 AM  Renovascular   Renal Artery US  Completed Yes      Home Medications    Current Outpatient Medications  Medication Sig Dispense Refill   aspirin  EC 81 MG tablet Take 81 mg by mouth daily.     atorvastatin  (LIPITOR) 40 MG tablet Take 1 tablet (40 mg total) by mouth daily. 90 tablet 3   carvedilol  (COREG ) 12.5 MG tablet Take 1 tablet (12.5 mg total) by mouth 2 (two) times daily. 180 tablet 1   ENTRESTO  97-103 MG TAKE 1 TABLET BY MOUTH TWICE DAILY 60 tablet 6   ferrous gluconate  (FERGON) 324 MG tablet Take 1 tablet (324 mg total) by mouth daily with breakfast. 90 tablet 3   furosemide  (LASIX ) 40 MG tablet Take 1 tablet (40 mg total) by mouth daily. 90 tablet 3   glipiZIDE  (GLUCOTROL ) 5 MG tablet Take 1 tablet (5 mg total) by mouth 2 (two) times daily before a meal. 180 tablet 1   hydrALAZINE  (APRESOLINE ) 100 MG tablet Take 1 tablet (100 mg total) by mouth 3 (three) times daily. 270 tablet 3   isosorbide  mononitrate (IMDUR ) 60 MG 24 hr tablet Take 1 tablet (60 mg total) by mouth daily. 30 tablet 11   latanoprost (XALATAN) 0.005 % ophthalmic solution Place 1 drop into both eyes at bedtime. (Patient not taking: Reported on 05/11/2024)     leflunomide  (ARAVA ) 20 MG tablet Take 1 tablet (20 mg total) by mouth daily. 90 tablet 0   linagliptin  (TRADJENTA ) 5 MG TABS tablet Take 1 tablet (5 mg total) by mouth daily. 90 tablet 1   methocarbamol  (ROBAXIN ) 500 MG tablet Take 1-2 tablets (500-1,000 mg total) by mouth every 8 (eight) hours as needed for muscle spasms. 20 tablet 0   spironolactone  (ALDACTONE ) 50 MG tablet Take 1 tablet (50 mg total) by mouth daily. 60 tablet 3    sulfamethoxazole -trimethoprim  (BACTRIM  DS) 800-160 MG tablet Take 1 tablet by mouth 2 (two) times daily. 14 tablet 0  No current facility-administered medications for this visit.     Assessment & Plan       Essential hypertension, benign Assessment: BP is uncontrolled in office BP 134/62 mmHg;  above the goal (<130/80). Due to misunderstanding, never increased spironolactone  to 50 mg bid.  (Although labs did show increase in potassium at last check) Tolerates Entresto , hydralazine , spironolactone , carvedilol  and isosorbide  well, without any side effects Denies SOB, palpitation, chest pain, headaches,or swelling Reiterated the importance of regular exercise and low salt diet   Plan:  Increase carvedilol  to 25 mg twice daily (try increasing at bedtime for 2-3 days then twice daily) Continue taking Entresto  97/103 bid, hydralazine  100 mg tid, spironolactone  50 mg every day, imdur  60 mg every day  Patient to keep record of BP readings with heart rate and report to us  at the next visit Patient to follow up with me in 3 months  Labs ordered today:  none   Allean Mink PharmD CPP [facility]  [address] [place], [place] [address] [phone]

## 2024-06-23 NOTE — Assessment & Plan Note (Signed)
 Assessment: BP is uncontrolled in office BP 134/62 mmHg;  above the goal (<130/80). Due to misunderstanding, never increased spironolactone  to 50 mg bid.  (Although labs did show increase in potassium at last check) Tolerates Entresto , hydralazine , spironolactone , carvedilol  and isosorbide  well, without any side effects Denies SOB, palpitation, chest pain, headaches,or swelling Reiterated the importance of regular exercise and low salt diet   Plan:  Increase carvedilol  to 25 mg twice daily (try increasing at bedtime for 2-3 days then twice daily) Continue taking Entresto  97/103 bid, hydralazine  100 mg tid, spironolactone  50 mg every day, imdur  60 mg every day  Patient to keep record of BP readings with heart rate and report to us  at the next visit Patient to follow up with me in 3 months  Labs ordered today:  none

## 2024-06-23 NOTE — Patient Instructions (Signed)
 Follow up appointment: with me in 3 months.  We'll call you soon to get that scheduled.    Take your BP meds as follows:  Try increasing carvedilol  to 2 tablets (25 mg total) twice daily.  Do bedtime dose with 2 tablets for 2-3 days, then add morning (2 tablets).  I'll call you in about 2 weeks to see if you're tolerating this.   Continue with all other medications  Try AZO-Cranberry daily to help prevent urinary tract infections.   Check your blood pressure at home daily (if able) and keep record of the readings.  Your blood pressure goal is < 130/80  To check your pressure at home you will need to:  1. Sit up in a chair, with feet flat on the floor and back supported. Do not cross your ankles or legs. 2. Rest your left arm so that the cuff is about heart level. If the cuff goes on your upper arm,  then just relax the arm on the table, arm of the chair or your lap. If you have a wrist cuff, we  suggest relaxing your wrist against your chest (think of it as Pledging the Flag with the  wrong arm).  3. Place the cuff snugly around your arm, about 1 inch above the crook of your elbow. The  cords should be inside the groove of your elbow.  4. Sit quietly, with the cuff in place, for about 5 minutes. After that 5 minutes press the power  button to start a reading. 5. Do not talk or move while the reading is taking place.  6. Record your readings on a sheet of paper. Although most cuffs have a memory, it is often  easier to see a pattern developing when the numbers are all in front of you.  7. You can repeat the reading after 1-3 minutes if it is recommended  Make sure your bladder is empty and you have not had caffeine or tobacco within the last 30 min  Always bring your blood pressure log with you to your appointments. If you have not brought your monitor in to be double checked for accuracy, please bring it to your next appointment.  You can find a list of quality blood pressure cuffs  at [email or web address]  Important lifestyle changes to control high blood pressure  Intervention  Effect on the BP  Lose extra pounds and watch your waistline Weight loss is one of the most effective lifestyle changes for controlling blood pressure. If you're overweight or obese, losing even a small amount of weight can help reduce blood pressure. Blood pressure might go down by about 1 millimeter of mercury (mm Hg) with each kilogram (about 2.2 pounds) of weight lost.  Exercise regularly As a general goal, aim for at least 30 minutes of moderate physical activity every day. Regular physical activity can lower high blood pressure by about 5 to 8 mm Hg.  Eat a healthy diet Eating a diet rich in whole grains, fruits, vegetables, and low-fat dairy products and low in saturated fat and cholesterol. A healthy diet can lower high blood pressure by up to 11 mm Hg.  Reduce salt (sodium) in your diet Even a small reduction of sodium in the diet can improve heart health and reduce high blood pressure by about 5 to 6 mm Hg.  Limit alcohol One drink equals 12 ounces of beer, 5 ounces of wine, or 1.5 ounces of 80-proof liquor.  Limiting alcohol to less than one drink  a day for women or two drinks a day for men can help lower blood pressure by about 4 mm Hg.   If you have any questions or concerns please use My Chart to send questions or call the office at [phone]

## 2024-06-25 NOTE — Progress Notes (Signed)
 Office Visit Note  Patient: Maria Foster             Date of Birth: [birth year]           MRN: [id number]             PCP: Bulah Alm RAMAN, PA-C Referring: Bulah Alm RAMAN, PA-C Visit Date: 07/08/2024   Subjective:  Follow-up (Right hip/back pain)    Discussed the use of AI scribe software for clinical note transcription with the patient, who gave verbal consent to proceed.  History of Present Illness   Maria Foster is a 68 y.o. female here for follow up for seropositive nodular rheumatoid arthritis on leflunomide  20 mg p.o. daily.    She has been experiencing aching pain in her right hip for the past three weeks. The pain is localized to the right hip area and does not radiate. She has been using a heating pad while sitting, which provides some relief. She initially thought the pain might be due to her mattress, but changing it did not alleviate the symptoms. The pain does not typically bother her while sleeping but may increase with excessive activity.  She is currently taking leflunomide  without any issues. She was previously prescribed methocarbamol , a muscle relaxer, by her primary care physician to be taken as needed, particularly at night. She has not continued it regularly as she was waiting for this appointment to confirm or change plan next step.  She has a history of rheumatoid nodules, which occasionally become sore if bumped, but they do not cause significant pain. She has had surgery in the past for these nodules but is not interested in further surgical intervention.  She has started using an exercise bike at home, which she shares with her husband.  No new or worsening pain in other areas and no significant issues with her current medications.        Previous HPI 03/02/2024 Maria Foster is a 68 y.o. female here for follow up for seropositive nodular rheumatoid arthritis on leflunomide  20 mg p.o. daily.     She experiences a stinging sensation in a red  area on her skin, which acts up intermittently and began bothering her last night. She speculates that it might be related to something she ate, specifically mentioning strawberry ice cream, although she is not allergic to strawberries and typically does not consume much sugar.   She recalls a previous issue with her left index finger, which was getting stuck and causing pain. She received a corticosteroid injection for this problem during her last visit, and since then, she has not experienced any further issues, indicating improvement.   She is currently taking leflunomide  and reports no stomach trouble associated with it. She was recently prescribed carvedilol  for her heart, but it causes her to feel dizzy and lightheaded, leading to a reduction in dosage. Despite the adjustment, she continues to experience these side effects, describing a sensation of being 'drunk'.   Regarding her joints, she experiences occasional aches, particularly when it rains, but finds relief through exercise. No increased swelling or weakness in her legs. She occasionally takes over-the-counter allergy medications like Zyrtec (cetirizine) 10 mg at night to avoid drowsiness during the day while caring for her four-year-old great-grandchild.      Previous HPI 10/07/2023 Maria Foster is a 68 y.o. female here for follow up for seropositive nodular rheumatoid arthritis on leflunomide  20 mg p.o. daily.  Overall most of her symptoms are doing  well and has not noticed either increase nor decrease in the nodules.  Current worst complaint is pain at her left index finger.  This hurts at rest and with use and she notices more frequently if the finger getting stuck and some decrease in ability to close her hand.  Not associated with any change in sensation or loss of grip strength.   Previous HPI 07/09/2023 Maria Foster is a 68 y.o. female here for follow up for seropositive nodular rheumatoid arthritis on leflunomide  20 mg p.o.  daily.  She has been doing well without any major flareup without significant joint pain or swelling until the past week.  She did a a lot of cleaning around the house including scrubbing of kitchen back splash and afterwards developed increased pain and swelling in the right wrist.  So far she is not taken anything extra or tried using her wrist band that she sometimes wears in the past for pain relief.   Previous HPI 04/08/2023 Maria Foster is a 68 y.o. female here for follow up for seropositive nodular rheumatoid arthritis on leflunomide  20 mg p.o. daily.  Has not seen much additional change in the hand and elbow nodules.  Not having significant hand pain or stiffness most days.  She noticed some worsening in her knee pain during recent rainy weather but on other days has no symptoms.  She feels she is noticing some gassiness or bloating with the leflunomide  but not too severe and is still taking the medicine as directed.  She had recent labs checked in April which we reviewed her blood count is normal and metabolic panel shows estimated GFR of 51 which is stable for her baseline.   Previous HPI 12/24/22 Maria Foster is a 68 y.o. female here for follow up for seropositive nodular RA on leflunomide  20 mg p.o. daily.  Unfortunately had interruption with starting due to hospital admission for systemic inflammation starting from urinary tract infection.  Joint pain symptoms have not changed significantly.  She is having issue with ongoing nonproductive cough.  Also with frequent eye watering and some irritation no vision changes and no associated swelling.  She has upcoming evaluations for her lungs and congestive heart failure.   Previous HPI 09/26/22 Maria Foster is a 68 y.o. female here for follow up for seropositive RA we did not start methotrexate as planned due to renal impairment worse than baseline checked at clinic appointment.  Symptoms remain about the same and also not seeing any  worsening with stopping hydroxychloroquine .   Previous HPI 07/02/2022 Maria Foster is a 68 y.o. female here for follow up seropositive RA on hydroxychloroquine  200 mg daily.  She feels like symptoms have made no significant difference on this treatment.  No recurrence of severe flareups like she had in her elbow either.  She does notice a few new and expanding nodules with episodes of spontaneous rupturing and white fluid drainage. Does not have much joint pain or swelling outside of the nodules.   Previous HPI 01/01/2022 Maria Foster is a 68 y.o. female here for follow up for seropositive RA on HCQ 200 mg daily. After our last visit with right elbow injection she developed worsening pain and then purulent drainage found to be MSSA infection of that right olecranon bursa and went to the hospital for surgical debridement of the wound. Wound culture was obtained also surgical report of rheumatoid nodule at the site. She took a course of antibiotics with PICC line  completed without incident. She resumed hydroxychloroquine  after this and she feels her joint pain is very well controlled at this time. She notices some increase in stiffness and pain with cold and changing weather.   Previous HPI 07/03/21 Maria Foster is a 68 y.o. female here for follow up for seropositive RA and chronic tophaceous gout. Recommended starting hydroxychloroquine  treatment 200 mg PO daily for suspected RA as active disease and normal uric levels. She feels her symptoms are improved since starting this, particularly states pain with weather and temperature changes are decreased. She has right elbow pain more than elsewhere says this hurts mostly due to pressure on the site when lying in bed or certain seated positions.   Previous HPI: 03/29/21 Maria Foster is a 69 y.o. female referred here for evaluation of rheumatoid arthritis.  She has a longstanding history of bilateral hand and knee pain that has been going on for  years with some progressive worsening and deformity over time.  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  She also has chronic knee pain and stiffness bilaterally without any large nodular changes.  She does not have much joint pain most days however experiences some episodically and also feels that her hands and knees hurt worse with cold and rainy weather.  She describes a history of rheumatoid arthritis but has never been on treatment plans for this.  She states she has never had any episode of gout involving the toes or feet but has been told by her cardiologist this is a problem that she has.   She does not recall ever undergoing bone density screening for osteoporosis. She has a long smoking history but in recent months is either not smoking or very reduced usage most days.    Review of Systems  Constitutional:  Negative for fatigue.  HENT:  Negative for mouth sores and mouth dryness.   Eyes:  Positive for dryness.  Respiratory:  Negative for shortness of breath.   Cardiovascular:  Negative for chest pain and palpitations.  Gastrointestinal:  Negative for blood in stool, constipation and diarrhea.  Endocrine: Negative for increased urination.  Genitourinary:  Negative for involuntary urination.  Musculoskeletal:  Positive for joint pain, joint pain, myalgias and myalgias. Negative for gait problem, joint swelling, muscle weakness, morning stiffness and muscle tenderness.  Skin:  Negative for color change, rash, hair loss and sensitivity to sunlight.  Allergic/Immunologic: Negative for susceptible to infections.  Neurological:  Negative for dizziness and headaches.  Hematological:  Negative for swollen glands.  Psychiatric/Behavioral:  Positive for sleep disturbance. Negative for depressed mood. The  patient is not nervous/anxious.     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 type 2 diabetes mellitus (HCC) 11/16/2021   Proliferative diabetic retinopathy of left eye associated  with type 2 diabetes mellitus (HCC) 11/16/2021   OSA (obstructive sleep apnea) 08/25/2021   Iron  deficiency 08/25/2021   Screening for cancer 08/25/2021   Influenza vaccination declined 08/25/2021   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   Stage 3a chronic kidney disease (HCC) 07/11/2021   Essential hypertension, benign 05/04/2021   Hyperlipidemia associated with type 2 diabetes mellitus (HCC) 05/04/2021   Enlarged and hypertrophic nails 05/04/2021   Encounter for screening mammogram for malignant neoplasm of breast 05/04/2021   Need for pneumococcal vaccination 05/04/2021   CHF (congestive heart failure) (HCC) 05/04/2021   Stable angina (HCC) 05/04/2021   Vaccine counseling 05/04/2021   High risk medication use 03/29/2021   Screen for colon cancer 01/27/2021   Tophi 01/11/2021   Rheumatoid arthritis involving both hands (HCC) 01/11/2021   Elevated troponin    Type 2 diabetes mellitus (HCC) 12/19/2020   Constipation 12/19/2020    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   OU progressive, will medically need to have cataract surgery with lens will implantation  so as to allow for complete clearance of the vitreous cavity in anticipation of likely vitrectomy in the future for progressive PDR despite previous PRP as delineated by the E DRS series of reports   Diabetes mellitus type 2 with complications (HCC)    diagnosed probably 2012 per patient   Diabetic retinopathy (HCC)    Hypertension    Rheumatoid arthritis (HCC)    Septic olecranon bursitis of right elbow 07/11/2021    Family History  Problem Relation Age of Onset   Heart attack Mother    Heart failure Mother    Diabetes Mother    Hypertension Mother    Heart failure Sister    Heart Problems Sister    Heart failure Sister    Hypertension Daughter    Amblyopia Neg Hx    Blindness Neg Hx    Cataracts Neg Hx    Glaucoma Neg Hx    Macular degeneration Neg Hx    Retinal detachment Neg Hx    Strabismus Neg Hx    Retinitis pigmentosa Neg Hx    Past Surgical History:  Procedure Laterality Date   INCISION AND DRAINAGE ABSCESS N/A 07/11/2021   Procedure: INCISION AND DRAINAGE RIGHT ELBOW;  Surgeon: Ernie Cough, MD;  Location: [facility];  Service: Orthopedics;  Laterality: N/A;   PRP Bilateral OD - 07/23/2017 OS - 08/06/2017   RIGHT/LEFT HEART CATH AND CORONARY ANGIOGRAPHY N/A 01/05/2021   Procedure: RIGHT/LEFT HEART CATH AND CORONARY ANGIOGRAPHY;  Surgeon: Cherrie Toribio SAUNDERS, MD;  Location: [facility];  Service: Cardiovascular;  Laterality: N/A;   TEE WITHOUT CARDIOVERSION N/A 10/09/2021   Procedure: TRANSESOPHAGEAL ECHOCARDIOGRAM (TEE);  Surgeon: Cherrie Toribio SAUNDERS, MD;  Location: [facility];  Service: Cardiovascular;  Laterality: N/A;   Social History   Social History Narrative   Not on file   Immunization History  Administered Date(s) Administered   PFIZER(Purple Top)SARS-COV-2 Vaccination 02/09/2020, 03/01/2020, 11/05/2020   PNEUMOCOCCAL CONJUGATE-20 12/16/2023   Pfizer(Comirnaty)Fall Seasonal Vaccine 12 years and older 12/14/2022   Pneumococcal Polysaccharide-23  05/04/2021   Tdap 07/11/2021     Objective: Vital Signs: BP (!) 154/79 (BP Location: Left Arm, Patient Position: Sitting, Cuff Size: Normal)   Pulse 70   Resp 18   Ht 5' 3 (1.6 m)   Wt 154 lb 6.4 oz (70 kg)   BMI 27.35 kg/m    Physical Exam  Eyes:     Conjunctiva/sclera: Conjunctivae normal.  Cardiovascular:     Rate and Rhythm: Normal rate and regular rhythm.  Pulmonary:     Effort: Pulmonary effort is normal.     Breath sounds: Normal breath sounds.  Musculoskeletal:     Right lower leg: No edema.     Left lower leg: No edema.  Lymphadenopathy:     Cervical: No cervical adenopathy.  Skin:    General: Skin is warm and dry.     Findings: No rash.  Neurological:     Mental Status: She is alert.  Psychiatric:        Mood and Affect: Mood normal.      Musculoskeletal Exam:  Shoulders full ROM no tenderness or swelling Elbows full ROM no tenderness or swelling, multiple nontender large mobile nodules on olecranon process and on forearm extensor surface Right wrist tenderness and swelling with decreased flexion extension range of motion, left wrist no pain or swelling but with multiple nodules on dorsal side Fingers chronic joint changes with numerous nontender nodules most extensive on MCP and PIP joints, slightly decreased flexion range of motion Right sided low back tenderness to pressure at top edge of iliac crest Knees full ROM no tenderness or swelling   Investigation: No additional findings.  Imaging: ECHOCARDIOGRAM COMPLETE Result Date: 07/04/2024    ECHOCARDIOGRAM REPORT   Patient Name:   Maria Foster Date of Exam: 07/04/2024 Medical Rec #:  [id number]       Height:       63.0 in Accession #:    [id number]      Weight:       159.0 lb Date of Birth:  07-09-1956       BSA:          1.754 m Patient Age:    68 years        BP:           144/71 mmHg Patient Gender: F               HR:           74 bpm. Exam Location:  Inpatient Procedure: 2D Echo, 3D Echo, Color  Doppler, Cardiac Doppler and Strain Analysis            (Both Spectral and Color Flow Doppler were utilized during            procedure). Indications:    CHF- Acute Systolic  History:        Patient has prior history of Echocardiogram examinations, most                 recent 10/30/2022. CHF, CAD, Mitral Valve Disease; Risk                 Factors:Hypertension, Diabetes, Sleep Apnea and Current Smoker.  Sonographer:    Logan Shove RDCS Referring Phys: [id number] MIGNON DASEN GONFA IMPRESSIONS  1. Left ventricular ejection fraction, by estimation, is 70 to 75%. The left ventricle has hyperdynamic function. The left ventricle has no regional wall motion abnormalities. Left ventricular diastolic parameters are consistent with Grade I diastolic dysfunction (impaired relaxation). Elevated left atrial pressure.  2. Right ventricular systolic function is normal. The right ventricular size is normal.  3. Left atrial size was mildly dilated.  4. Mild mitral valve regurgitation.  5. The aortic valve is tricuspid. Aortic valve regurgitation is mild.  6. The inferior vena cava is normal in size with greater than 50%  respiratory variability, suggesting right atrial pressure of 3 mmHg. FINDINGS  Left Ventricle: Left ventricular ejection fraction, by estimation, is 70 to 75%. The left ventricle has hyperdynamic function. The left ventricle has no regional wall motion abnormalities. The global longitudinal strain is normal despite suboptimal segment tracking. 3D ejection fraction reviewed and evaluated as part of the interpretation. Alternate measurement of EF is felt to be most reflective of LV function. The left ventricular internal cavity size was normal in size. There is no left ventricular hypertrophy. Left ventricular diastolic parameters are consistent with Grade I diastolic dysfunction (impaired relaxation). Elevated left atrial pressure. Right Ventricle: The right ventricular size is normal. Right vetricular wall thickness was not  assessed. Right ventricular systolic function is normal. Left Atrium: Left atrial size was mildly dilated. Right Atrium: Right atrial size was normal in size. Pericardium: There is no evidence of pericardial effusion. Mitral Valve: Mild mitral valve regurgitation. Tricuspid Valve: The tricuspid valve is normal in structure. Tricuspid valve regurgitation is trivial. Aortic Valve: The aortic valve is tricuspid. Aortic valve regurgitation is mild. Aortic valve mean gradient measures 6.0 mmHg. Aortic valve peak gradient measures 10.6 mmHg. Aortic valve area, by VTI measures 2.25 cm. Pulmonic Valve: The pulmonic valve was not well visualized. Pulmonic valve regurgitation is mild. Aorta: The aortic root and ascending aorta are structurally normal, with no evidence of dilitation. Venous: The inferior vena cava is normal in size with greater than 50% respiratory variability, suggesting right atrial pressure of 3 mmHg. IAS/Shunts: No atrial level shunt detected by color flow Doppler. Additional Comments: 3D was performed not requiring image post processing on an independent workstation and was normal.  LEFT VENTRICLE PLAX 2D LVIDd:         4.70 cm   Diastology LVIDs:         2.70 cm   LV e' medial:    4.57 cm/s LV PW:         1.10 cm   LV E/e' medial:  23.4 LV IVS:        1.00 cm   LV e' lateral:   5.44 cm/s LVOT diam:     2.00 cm   LV E/e' lateral: 19.7 LV SV:         75 LV SV Index:   43 LVOT Area:     3.14 cm                           3D Volume EF:                          3D EF:        55 %                          LV EDV:       136 ml                          LV ESV:       61 ml                          LV SV:        75 ml RIGHT VENTRICLE             IVC RV Basal diam:  2.90 cm     IVC diam:  0.80 cm RV S prime:     13.10 cm/s TAPSE (M-mode): 2.3 cm LEFT ATRIUM             Index        RIGHT ATRIUM           Index LA diam:        4.00 cm 2.28 cm/m   RA Area:     15.00 cm LA Vol (A2C):   85.6 ml 48.81 ml/m  RA  Volume:   37.60 ml  21.44 ml/m LA Vol (A4C):   60.8 ml 34.67 ml/m LA Biplane Vol: 73.6 ml 41.97 ml/m  AORTIC VALVE AV Area (Vmax):    2.10 cm AV Area (Vmean):   2.07 cm AV Area (VTI):     2.25 cm AV Vmax:           163.00 cm/s AV Vmean:          107.000 cm/s AV VTI:            0.333 m AV Peak Grad:      10.6 mmHg AV Mean Grad:      6.0 mmHg LVOT Vmax:         109.00 cm/s LVOT Vmean:        70.400 cm/s LVOT VTI:          0.239 m LVOT/AV VTI ratio: 0.72  AORTA Ao Root diam: 2.80 cm Ao Asc diam:  3.00 cm MITRAL VALVE MV Area (PHT): 3.72 cm     SHUNTS MV Decel Time: 204 msec     Systemic VTI:  0.24 m MV E velocity: 107.00 cm/s  Systemic Diam: 2.00 cm MV A velocity: 127.00 cm/s MV E/A ratio:  0.84 Vina Gull MD Electronically signed by Vina Gull MD Signature Date/Time: 07/04/2024/1:31:23 PM    Final    VAS US  LOWER EXTREMITY VENOUS (DVT) Result Date: 07/04/2024  Lower Venous DVT Study Patient Name:  Maria Foster  Date of Exam:   07/04/2024 Medical Rec #: [id number]        Accession #:    [id number] Date of Birth: August 28, 1956        Patient Gender: F Patient Age:   18 years Exam Location:  [facility] Procedure:      VAS US  LOWER EXTREMITY VENOUS (DVT) Referring Phys: MIGNON GONFA --------------------------------------------------------------------------------  Indications: Elevated D-dimer (5.68).  Limitations: Poor ultrasound/tissue interface. Comparison Study: No previous exams Performing Technologist: Jody Hill RVT, RDMS  Examination Guidelines: A complete evaluation includes B-mode imaging, spectral Doppler, color Doppler, and power Doppler as needed of all accessible portions of each vessel. Bilateral testing is considered an integral part of a complete examination. Limited examinations for reoccurring indications may be performed as noted. The reflux portion of the exam is performed with the patient in reverse Trendelenburg.  +---------+---------------+---------+-----------+----------+--------------+  RIGHT    CompressibilityPhasicitySpontaneityPropertiesThrombus Aging +---------+---------------+---------+-----------+----------+--------------+ CFV      Full           Yes      Yes                                 +---------+---------------+---------+-----------+----------+--------------+ SFJ      Full                                                        +---------+---------------+---------+-----------+----------+--------------+  FV Prox  Full           Yes      Yes                                 +---------+---------------+---------+-----------+----------+--------------+ FV Mid   Full           Yes      Yes                                 +---------+---------------+---------+-----------+----------+--------------+ FV DistalFull           Yes      Yes                                 +---------+---------------+---------+-----------+----------+--------------+ PFV      Full                                                        +---------+---------------+---------+-----------+----------+--------------+ POP      Full           Yes      Yes                                 +---------+---------------+---------+-----------+----------+--------------+ PTV      Full                                                        +---------+---------------+---------+-----------+----------+--------------+ PERO     Full                                                        +---------+---------------+---------+-----------+----------+--------------+   +---------+---------------+---------+-----------+----------+--------------+ LEFT     CompressibilityPhasicitySpontaneityPropertiesThrombus Aging +---------+---------------+---------+-----------+----------+--------------+ CFV      Full           Yes      Yes                                 +---------+---------------+---------+-----------+----------+--------------+ SFJ      Full                                                         +---------+---------------+---------+-----------+----------+--------------+ FV Prox  Full           Yes      Yes                                 +---------+---------------+---------+-----------+----------+--------------+ FV Mid   Full  Yes      Yes                                 +---------+---------------+---------+-----------+----------+--------------+ FV DistalFull           Yes      Yes                                 +---------+---------------+---------+-----------+----------+--------------+ PFV      Full                                                        +---------+---------------+---------+-----------+----------+--------------+ POP      Full           Yes      Yes                                 +---------+---------------+---------+-----------+----------+--------------+ PTV      Full                                                        +---------+---------------+---------+-----------+----------+--------------+ PERO     Full                                                        +---------+---------------+---------+-----------+----------+--------------+     Summary: BILATERAL: - No evidence of deep vein thrombosis seen in the lower extremities, bilaterally. -No evidence of popliteal cyst, bilaterally. - Ultrasound characteristics of enlarged lymph nodes are noted in the groin, bilaterally.   *See table(s) above for measurements and observations. Electronically signed by Penne Colorado MD on 07/04/2024 at 12:13:14 PM.    Final    CT Angio Chest PE W and/or Wo Contrast Result Date: 07/03/2024 CLINICAL DATA:  Positive D-dimer and shortness of breath EXAM: CT ANGIOGRAPHY CHEST WITH CONTRAST TECHNIQUE: Multidetector CT imaging of the chest was performed using the standard protocol during bolus administration of intravenous contrast. Multiplanar CT image reconstructions and MIPs were obtained to evaluate the vascular anatomy.  RADIATION DOSE REDUCTION: This exam was performed according to the departmental dose-optimization program which includes automated exposure control, adjustment of the mA and/or kV according to patient size and/or use of iterative reconstruction technique. CONTRAST:  75mL OMNIPAQUE  IOHEXOL  350 MG/ML SOLN COMPARISON:  Chest x-ray from earlier in the same day. FINDINGS: Cardiovascular: Thoracic aorta and its branches are within normal limits. Some atherosclerotic calcifications and soft atherosclerotic plaque is noted. No aneurysmal dilatation or dissection is noted. Heart is at the upper limits of normal in size. The pulmonary artery shows a normal branching pattern bilaterally. No filling defect to suggest pulmonary embolism is noted. Mediastinum/Nodes: Thoracic inlet is within normal limits. No hilar or mediastinal adenopathy is noted. The esophagus as visualized is within normal limits. Lungs/Pleura: Small right pleural effusion is seen. Emphysematous changes are  identified. Mild interstitial edema is noted particularly in the right lung similar to that seen on prior plain film examination. Similar changes but to a lesser degree are noted on the left. Upper Abdomen: Visualized upper abdomen shows no acute abnormality. Musculoskeletal: Degenerative changes of the thoracic spine are noted. Review of the MIP images confirms the above findings. IMPRESSION: No evidence of pulmonary emboli. Changes of CHF with interstitial edema. Aortic Atherosclerosis (ICD10-I70.0) and Emphysema (ICD10-J43.9). Electronically Signed   By: Oneil Devonshire M.D.   On: 07/03/2024 02:32   DG Chest Portable 1 View Result Date: 07/03/2024 CLINICAL DATA:  Shortness of breath. EXAM: PORTABLE CHEST 1 VIEW COMPARISON:  December 19, 2020 FINDINGS: The heart size and mediastinal contours are within normal limits. Mild to moderate severity diffusely increased interstitial lung markings are seen. This is increased in severity when compared to the prior  study. Mild atelectatic changes are suspected within the bilateral lung bases. There is a small right pleural effusion. No pneumothorax is identified. The visualized skeletal structures are unremarkable. IMPRESSION: 1. Mild to moderate severity interstitial edema with mild bibasilar atelectasis. 2. Small right pleural effusion. Electronically Signed   By: Suzen Dials M.D.   On: 07/03/2024 01:40    Recent Labs: Lab Results  Component Value Date   WBC 9.7 07/04/2024   HGB 10.0 (L) 07/04/2024   PLT 175 07/04/2024   NA 138 07/05/2024   K 3.6 07/05/2024   CL 102 07/05/2024   CO2 25 07/05/2024   GLUCOSE 127 (H) 07/05/2024   BUN 15 07/05/2024   CREATININE 1.27 (H) 07/05/2024   BILITOT 1.0 07/03/2024   ALKPHOS 131 (H) 07/03/2024   AST 15 07/03/2024   ALT 6 07/03/2024   PROT 7.5 07/03/2024   ALBUMIN 4.0 07/03/2024   CALCIUM  8.4 (L) 07/05/2024   GFRAA >60 11/29/2016    Speciality Comments: No specialty comments available.  Procedures:  No procedures performed Allergies: Metformin  and related, Amoxicillin , and Farxiga  [dapagliflozin ]   Assessment / Plan:     Visit Diagnoses: Rheumatoid arthritis involving both hands with positive rheumatoid factor (HCC) - Plan: leflunomide  (ARAVA ) 20 MG tablet Inflammatory thrice appears well-controlled without very prolonged morning stiffness no major flare of symptoms and there are extensive nodular disease but not much peripheral joint synovitis appreciable on exam. - Continue leflunomide  20 mg p.o. daily  Stage 3a chronic kidney disease (HCC) High risk medication use - leflunomide  20 mg daily. Recent labs CBC and CMP from July reviewed from hospital visit that was for heart failure exacerbation.  Anemia of 10 otherwise normal.  Metabolic panel with estimated GFR of 46.  Finger pain, left - S/P Hand/UE Inj: L index A1 for trigger finger on 10/07/2023  Muscle spasm - Plan: methocarbamol  (ROBAXIN ) 500 MG tablet Right hip and low back  pain Pain likely muscular, involving gluteal muscles and quadratus lumborum, without significant sleep interference. - Continue methocarbamol  500 mg once daily at night. - Perform stretching exercises for gluteal muscles and quadratus lumborum. - Use heating pad as needed.    Orders: No orders of the defined types were placed in this encounter.  Meds ordered this encounter  Medications   methocarbamol  (ROBAXIN ) 500 MG tablet    Sig: Take 1 tablet (500 mg total) by mouth at bedtime as needed for muscle spasms.    Dispense:  30 tablet    Refill:  0   leflunomide  (ARAVA ) 20 MG tablet    Sig: Take 1 tablet (20 mg total) by mouth  daily.    Dispense:  90 tablet    Refill:  0     Follow-Up Instructions: Return in about 3 months (around 10/08/2024) for RA on LEF f/u 3mos.   Lonni LELON Ester, MD  Note - This record has been created using [employer].  Chart creation errors have been sought, but may not always  have been located. Such creation errors do not reflect on  the standard of medical care.

## 2024-07-03 ENCOUNTER — Other Ambulatory Visit: Payer: Self-pay

## 2024-07-03 ENCOUNTER — Emergency Department (HOSPITAL_BASED_OUTPATIENT_CLINIC_OR_DEPARTMENT_OTHER)

## 2024-07-03 ENCOUNTER — Inpatient Hospital Stay (HOSPITAL_BASED_OUTPATIENT_CLINIC_OR_DEPARTMENT_OTHER)
Admission: EM | Admit: 2024-07-03 | Discharge: 2024-07-05 | DRG: 291 | Disposition: A | Discharge status: Home / Self Care | Attending: Internal Medicine | Admitting: Internal Medicine

## 2024-07-03 ENCOUNTER — Encounter (HOSPITAL_BASED_OUTPATIENT_CLINIC_OR_DEPARTMENT_OTHER): Payer: Self-pay

## 2024-07-03 DIAGNOSIS — Z7984 Long term (current) use of oral hypoglycemic drugs: Secondary | ICD-10-CM

## 2024-07-03 DIAGNOSIS — I1A0 Resistant hypertension: Secondary | ICD-10-CM

## 2024-07-03 DIAGNOSIS — E11319 Type 2 diabetes mellitus with unspecified diabetic retinopathy without macular edema: Secondary | ICD-10-CM | POA: Diagnosis present

## 2024-07-03 DIAGNOSIS — I110 Hypertensive heart disease with heart failure: Secondary | ICD-10-CM | POA: Diagnosis not present

## 2024-07-03 DIAGNOSIS — Z87891 Personal history of nicotine dependence: Secondary | ICD-10-CM | POA: Diagnosis not present

## 2024-07-03 DIAGNOSIS — Z79899 Other long term (current) drug therapy: Secondary | ICD-10-CM | POA: Diagnosis not present

## 2024-07-03 DIAGNOSIS — I509 Heart failure, unspecified: Secondary | ICD-10-CM | POA: Diagnosis not present

## 2024-07-03 DIAGNOSIS — E876 Hypokalemia: Secondary | ICD-10-CM | POA: Diagnosis not present

## 2024-07-03 DIAGNOSIS — Z833 Family history of diabetes mellitus: Secondary | ICD-10-CM | POA: Diagnosis not present

## 2024-07-03 DIAGNOSIS — Z7982 Long term (current) use of aspirin: Secondary | ICD-10-CM

## 2024-07-03 DIAGNOSIS — N1831 Chronic kidney disease, stage 3a: Secondary | ICD-10-CM | POA: Diagnosis not present

## 2024-07-03 DIAGNOSIS — G4733 Obstructive sleep apnea (adult) (pediatric): Secondary | ICD-10-CM | POA: Diagnosis not present

## 2024-07-03 DIAGNOSIS — I5021 Acute systolic (congestive) heart failure: Secondary | ICD-10-CM | POA: Diagnosis not present

## 2024-07-03 DIAGNOSIS — Z888 Allergy status to other drugs, medicaments and biological substances status: Secondary | ICD-10-CM

## 2024-07-03 DIAGNOSIS — J9601 Acute respiratory failure with hypoxia: Secondary | ICD-10-CM | POA: Diagnosis present

## 2024-07-03 DIAGNOSIS — Z8249 Family history of ischemic heart disease and other diseases of the circulatory system: Secondary | ICD-10-CM

## 2024-07-03 DIAGNOSIS — I2489 Other forms of acute ischemic heart disease: Secondary | ICD-10-CM | POA: Diagnosis present

## 2024-07-03 DIAGNOSIS — D649 Anemia, unspecified: Secondary | ICD-10-CM | POA: Diagnosis present

## 2024-07-03 DIAGNOSIS — J439 Emphysema, unspecified: Secondary | ICD-10-CM | POA: Diagnosis not present

## 2024-07-03 DIAGNOSIS — I10 Essential (primary) hypertension: Secondary | ICD-10-CM

## 2024-07-03 DIAGNOSIS — I5043 Acute on chronic combined systolic (congestive) and diastolic (congestive) heart failure: Secondary | ICD-10-CM | POA: Diagnosis not present

## 2024-07-03 DIAGNOSIS — J811 Chronic pulmonary edema: Secondary | ICD-10-CM | POA: Diagnosis not present

## 2024-07-03 DIAGNOSIS — R112 Nausea with vomiting, unspecified: Secondary | ICD-10-CM | POA: Diagnosis present

## 2024-07-03 DIAGNOSIS — Z91199 Patient's noncompliance with other medical treatment and regimen due to unspecified reason: Secondary | ICD-10-CM

## 2024-07-03 DIAGNOSIS — E785 Hyperlipidemia, unspecified: Secondary | ICD-10-CM | POA: Diagnosis not present

## 2024-07-03 DIAGNOSIS — I130 Hypertensive heart and chronic kidney disease with heart failure and stage 1 through stage 4 chronic kidney disease, or unspecified chronic kidney disease: Principal | ICD-10-CM | POA: Diagnosis present

## 2024-07-03 DIAGNOSIS — R0602 Shortness of breath: Secondary | ICD-10-CM | POA: Diagnosis not present

## 2024-07-03 DIAGNOSIS — M069 Rheumatoid arthritis, unspecified: Secondary | ICD-10-CM | POA: Diagnosis not present

## 2024-07-03 DIAGNOSIS — J9811 Atelectasis: Secondary | ICD-10-CM | POA: Diagnosis not present

## 2024-07-03 DIAGNOSIS — J90 Pleural effusion, not elsewhere classified: Secondary | ICD-10-CM | POA: Diagnosis not present

## 2024-07-03 DIAGNOSIS — R7989 Other specified abnormal findings of blood chemistry: Secondary | ICD-10-CM | POA: Diagnosis not present

## 2024-07-03 LAB — HEPATIC FUNCTION PANEL
ALT: 6 U/L (ref 0–44)
AST: 15 U/L (ref 15–41)
Albumin: 4 g/dL (ref 3.5–5.0)
Alkaline Phosphatase: 131 U/L — ABNORMAL HIGH (ref 38–126)
Bilirubin, Direct: 0.5 mg/dL — ABNORMAL HIGH (ref 0.0–0.2)
Indirect Bilirubin: 0.5 mg/dL (ref 0.3–0.9)
Total Bilirubin: 1 mg/dL (ref 0.0–1.2)
Total Protein: 7.5 g/dL (ref 6.5–8.1)

## 2024-07-03 LAB — BASIC METABOLIC PANEL WITH GFR
Anion gap: 15 (ref 5–15)
Anion gap: 14 (ref 5–15)
BUN: 15 mg/dL (ref 8–23)
BUN: 14 mg/dL (ref 8–23)
CO2: 23 mmol/L (ref 22–32)
CO2: 25 mmol/L (ref 22–32)
Calcium: 9.1 mg/dL (ref 8.9–10.3)
Calcium: 8.6 mg/dL — ABNORMAL LOW (ref 8.9–10.3)
Chloride: 105 mmol/L (ref 98–111)
Chloride: 102 mmol/L (ref 98–111)
Creatinine, Ser: 1.09 mg/dL — ABNORMAL HIGH (ref 0.44–1.00)
Creatinine, Ser: 1.15 mg/dL — ABNORMAL HIGH (ref 0.44–1.00)
GFR, Estimated: 55 mL/min — ABNORMAL LOW (ref >60)
GFR, Estimated: 52 mL/min — ABNORMAL LOW (ref >60)
Glucose, Bld: 198 mg/dL — ABNORMAL HIGH (ref 70–99)
Glucose, Bld: 165 mg/dL — ABNORMAL HIGH (ref 70–99)
Potassium: 3.1 mmol/L — ABNORMAL LOW (ref 3.5–5.1)
Potassium: 2.9 mmol/L — ABNORMAL LOW (ref 3.5–5.1)
Sodium: 143 mmol/L (ref 135–145)
Sodium: 141 mmol/L (ref 135–145)

## 2024-07-03 LAB — GLUCOSE, CAPILLARY: Glucose-Capillary: 233 mg/dL — ABNORMAL HIGH (ref 70–99)

## 2024-07-03 LAB — CBC
HCT: 35.3 % — ABNORMAL LOW (ref 36.0–46.0)
Hemoglobin: 11.3 g/dL — ABNORMAL LOW (ref 12.0–15.0)
MCH: 29.6 pg (ref 26.0–34.0)
MCHC: 32 g/dL (ref 30.0–36.0)
MCV: 92.4 fL (ref 80.0–100.0)
Platelets: 199 K/uL (ref 150–400)
RBC: 3.82 MIL/uL — ABNORMAL LOW (ref 3.87–5.11)
RDW: 14.1 % (ref 11.5–15.5)
WBC: 18.4 K/uL — ABNORMAL HIGH (ref 4.0–10.5)
nRBC: 0 % (ref 0.0–0.2)

## 2024-07-03 LAB — TROPONIN T, HIGH SENSITIVITY
Troponin T High Sensitivity: 22 ng/L — ABNORMAL HIGH (ref 0–19)
Troponin T High Sensitivity: 41 ng/L — ABNORMAL HIGH (ref 0–19)

## 2024-07-03 LAB — CBG MONITORING, ED
Glucose-Capillary: 222 mg/dL — ABNORMAL HIGH (ref 70–99)
Glucose-Capillary: 188 mg/dL — ABNORMAL HIGH (ref 70–99)

## 2024-07-03 LAB — D-DIMER, QUANTITATIVE: D-Dimer, Quant: 5.68 ug{FEU}/mL — ABNORMAL HIGH (ref 0.00–0.50)

## 2024-07-03 LAB — PRO BRAIN NATRIURETIC PEPTIDE: Pro Brain Natriuretic Peptide: 5847 pg/mL — ABNORMAL HIGH (ref <300.0)

## 2024-07-03 LAB — MAGNESIUM: Magnesium: 1.6 mg/dL — ABNORMAL LOW (ref 1.7–2.4)

## 2024-07-03 LAB — HIV ANTIBODY (ROUTINE TESTING W REFLEX): HIV Screen 4th Generation wRfx: NONREACTIVE

## 2024-07-03 MED ORDER — SODIUM CHLORIDE 0.9% FLUSH
3.0000 mL | Freq: Two times a day (BID) | INTRAVENOUS | Status: DC
  Administered 2024-07-03 – 2024-07-04 (×3): 3 mL via INTRAVENOUS

## 2024-07-03 MED ORDER — POTASSIUM CHLORIDE CRYS ER 20 MEQ PO TBCR
40.0000 meq | EXTENDED_RELEASE_TABLET | ORAL | Status: AC
  Administered 2024-07-03 (×2): 40 meq via ORAL
  Filled 2024-07-03 (×2): qty 2

## 2024-07-03 MED ORDER — ATORVASTATIN CALCIUM 40 MG PO TABS
40.0000 mg | ORAL_TABLET | Freq: Every day | ORAL | Status: DC
  Administered 2024-07-03 – 2024-07-05 (×3): 40 mg via ORAL
  Filled 2024-07-03 (×4): qty 1

## 2024-07-03 MED ORDER — POTASSIUM CHLORIDE CRYS ER 20 MEQ PO TBCR
40.0000 meq | EXTENDED_RELEASE_TABLET | Freq: Once | ORAL | Status: AC
  Administered 2024-07-03: 40 meq via ORAL
  Filled 2024-07-03: qty 2

## 2024-07-03 MED ORDER — LABETALOL HCL 5 MG/ML IV SOLN: 10.0000 mg | INTRAVENOUS | Status: DC | PRN

## 2024-07-03 MED ORDER — ENOXAPARIN SODIUM 40 MG/0.4ML IJ SOSY
40.0000 mg | PREFILLED_SYRINGE | INTRAMUSCULAR | Status: DC
  Administered 2024-07-03 – 2024-07-04 (×2): 40 mg via SUBCUTANEOUS
  Filled 2024-07-03 (×2): qty 0.4

## 2024-07-03 MED ORDER — HYDRALAZINE HCL 20 MG/ML IJ SOLN
10.0000 mg | Freq: Once | INTRAMUSCULAR | Status: AC
  Administered 2024-07-03: 10 mg via INTRAVENOUS
  Filled 2024-07-03: qty 1

## 2024-07-03 MED ORDER — GLIPIZIDE 5 MG PO TABS
5.0000 mg | ORAL_TABLET | Freq: Two times a day (BID) | ORAL | Status: DC
  Filled 2024-07-03: qty 1

## 2024-07-03 MED ORDER — SPIRONOLACTONE 25 MG PO TABS
50.0000 mg | ORAL_TABLET | Freq: Every day | ORAL | Status: DC
  Administered 2024-07-03 – 2024-07-05 (×3): 50 mg via ORAL
  Filled 2024-07-03 (×3): qty 2

## 2024-07-03 MED ORDER — INSULIN ASPART 100 UNIT/ML IJ SOLN
0.0000 [IU] | Freq: Every day | INTRAMUSCULAR | Status: DC
  Administered 2024-07-03: 2 [IU] via SUBCUTANEOUS

## 2024-07-03 MED ORDER — POTASSIUM CHLORIDE CRYS ER 20 MEQ PO TBCR
40.0000 meq | EXTENDED_RELEASE_TABLET | Freq: Once | ORAL | Status: AC
  Administered 2024-07-03: 40 meq via ORAL

## 2024-07-03 MED ORDER — CARVEDILOL 12.5 MG PO TABS
12.5000 mg | ORAL_TABLET | Freq: Two times a day (BID) | ORAL | Status: DC
  Administered 2024-07-03 – 2024-07-05 (×5): 12.5 mg via ORAL
  Filled 2024-07-03 (×6): qty 1

## 2024-07-03 MED ORDER — POTASSIUM CHLORIDE CRYS ER 20 MEQ PO TBCR
40.0000 meq | EXTENDED_RELEASE_TABLET | Freq: Once | ORAL | Status: DC
  Filled 2024-07-03: qty 2

## 2024-07-03 MED ORDER — SODIUM CHLORIDE 0.9 % IV SOLN: 250.0000 mL | INTRAVENOUS | Status: AC | PRN

## 2024-07-03 MED ORDER — ACETAMINOPHEN 325 MG PO TABS
650.0000 mg | ORAL_TABLET | ORAL | Status: DC | PRN
Start: 2024-07-03 — End: 2024-07-05

## 2024-07-03 MED ORDER — METOCLOPRAMIDE HCL 5 MG/ML IJ SOLN
10.0000 mg | Freq: Once | INTRAMUSCULAR | Status: AC
  Administered 2024-07-03: 10 mg via INTRAVENOUS
  Filled 2024-07-03: qty 2

## 2024-07-03 MED ORDER — ISOSORBIDE MONONITRATE ER 60 MG PO TB24
60.0000 mg | ORAL_TABLET | Freq: Every day | ORAL | Status: DC
Start: 2024-07-03 — End: 2024-07-05
  Administered 2024-07-03 – 2024-07-05 (×3): 60 mg via ORAL
  Filled 2024-07-03 (×3): qty 1

## 2024-07-03 MED ORDER — FUROSEMIDE 10 MG/ML IJ SOLN
40.0000 mg | Freq: Two times a day (BID) | INTRAMUSCULAR | Status: DC
  Administered 2024-07-03 – 2024-07-04 (×2): 40 mg via INTRAVENOUS
  Filled 2024-07-03 (×2): qty 4

## 2024-07-03 MED ORDER — INSULIN ASPART 100 UNIT/ML IJ SOLN
0.0000 [IU] | Freq: Three times a day (TID) | INTRAMUSCULAR | Status: DC
  Administered 2024-07-04: 1 [IU] via SUBCUTANEOUS
  Administered 2024-07-04 (×2): 2 [IU] via SUBCUTANEOUS

## 2024-07-03 MED ORDER — SODIUM CHLORIDE 0.9% FLUSH: 3.0000 mL | INTRAVENOUS | Status: DC | PRN

## 2024-07-03 MED ORDER — ASPIRIN 81 MG PO TBEC
81.0000 mg | DELAYED_RELEASE_TABLET | Freq: Every day | ORAL | Status: DC
  Administered 2024-07-03 – 2024-07-05 (×3): 81 mg via ORAL
  Filled 2024-07-03 (×4): qty 1

## 2024-07-03 MED ORDER — HYDRALAZINE HCL 50 MG PO TABS
100.0000 mg | ORAL_TABLET | Freq: Three times a day (TID) | ORAL | Status: DC
  Administered 2024-07-03 – 2024-07-05 (×5): 100 mg via ORAL
  Filled 2024-07-03 (×5): qty 2

## 2024-07-03 MED ORDER — POTASSIUM CHLORIDE 10 MEQ/100ML IV SOLN: 10.0000 meq | INTRAVENOUS | Status: AC

## 2024-07-03 MED ORDER — FUROSEMIDE 10 MG/ML IJ SOLN
40.0000 mg | Freq: Once | INTRAMUSCULAR | Status: AC
  Administered 2024-07-03: 40 mg via INTRAVENOUS
  Filled 2024-07-03: qty 4

## 2024-07-03 MED ORDER — NITROGLYCERIN IN D5W 200-5 MCG/ML-% IV SOLN: 0.0000 ug/min | INTRAVENOUS | Status: DC

## 2024-07-03 MED ORDER — IOHEXOL 350 MG/ML SOLN
75.0000 mL | Freq: Once | INTRAVENOUS | Status: AC | PRN
  Administered 2024-07-03: 75 mL via INTRAVENOUS

## 2024-07-03 MED ORDER — SACUBITRIL-VALSARTAN 97-103 MG PO TABS
1.0000 | ORAL_TABLET | Freq: Two times a day (BID) | ORAL | Status: DC
  Administered 2024-07-03 – 2024-07-05 (×5): 1 via ORAL
  Filled 2024-07-03 (×5): qty 1

## 2024-07-03 MED ORDER — FERROUS GLUCONATE 324 (38 FE) MG PO TABS
324.0000 mg | ORAL_TABLET | Freq: Every day | ORAL | Status: DC
  Administered 2024-07-04 – 2024-07-05 (×2): 324 mg via ORAL
  Filled 2024-07-03 (×2): qty 1

## 2024-07-03 NOTE — ED Provider Notes (Signed)
 Maria Foster AT [facility] Provider Note   CSN: [id number] Arrival date & time: 07/03/24  9951     Patient presents with: Shortness of Breath   Maria Foster is a 68 y.o. female.   Patient with a history rheumatoid arthritis, diabetes, hypertension, CHF with reduced ejection fraction here with difficulty breathing.  Onset this afternoon became worse tonight when she tried to lie down.  Has a nonproductive cough.  No fever but has had chills and felt cold.  No chest pain.  Some increased of her leg swelling.  States in spite of the medication including her Lasix  which he took tonight.  Last ejection fraction was 45-50% with diastolic dysfunction.  Denies fever.  Denies productive cough.  Denies chest pain.  No abdominal pain or vomiting.  The history is provided by the patient.  Shortness of Breath Associated symptoms: no chest pain, no fever, no rash and no vomiting        Prior to Admission medications   Medication Sig Start Date End Date Taking? Authorizing Provider  aspirin  EC 81 MG tablet Take 81 mg by mouth daily.    [provider]  atorvastatin  (LIPITOR) 40 MG tablet Take 1 tablet (40 mg total) by mouth daily. 02/25/24   Vannie Reche RAMAN, NP  carvedilol  (COREG ) 12.5 MG tablet Take 1 tablet (12.5 mg total) by mouth 2 (two) times daily. 02/25/24   Vannie Reche RAMAN, NP  ENTRESTO  97-103 MG TAKE 1 TABLET BY MOUTH TWICE DAILY 06/04/24   Walker, Caitlin S, NP  ferrous gluconate  (FERGON) 324 MG tablet Take 1 tablet (324 mg total) by mouth daily with breakfast. 06/04/23   Tysinger, Alm RAMAN, PA-C  furosemide  (LASIX ) 40 MG tablet Take 1 tablet (40 mg total) by mouth daily. 02/25/24   Walker, Caitlin S, NP  glipiZIDE  (GLUCOTROL ) 5 MG tablet Take 1 tablet (5 mg total) by mouth 2 (two) times daily before a meal. 12/17/23   Tysinger, Alm RAMAN, PA-C  hydrALAZINE  (APRESOLINE ) 100 MG tablet Take 1 tablet (100 mg total) by mouth 3 (three) times daily. 02/25/24    Vannie Reche RAMAN, NP  isosorbide  mononitrate (IMDUR ) 60 MG 24 hr tablet Take 1 tablet (60 mg total) by mouth daily. 02/25/24   Walker, Caitlin S, NP  latanoprost (XALATAN) 0.005 % ophthalmic solution Place 1 drop into both eyes at bedtime. Patient not taking: Reported on 05/11/2024    [provider]  leflunomide  (ARAVA ) 20 MG tablet Take 1 tablet (20 mg total) by mouth daily. 03/02/24   Rice, Lonni ORN, MD  linagliptin  (TRADJENTA ) 5 MG TABS tablet Take 1 tablet (5 mg total) by mouth daily. 12/17/23   Tysinger, Alm RAMAN, PA-C  methocarbamol  (ROBAXIN ) 500 MG tablet Take 1-2 tablets (500-1,000 mg total) by mouth every 8 (eight) hours as needed for muscle spasms. 05/11/24   Randol Dawes, MD  spironolactone  (ALDACTONE ) 50 MG tablet Take 1 tablet (50 mg total) by mouth daily. 03/17/24   Raford Riggs, MD  sulfamethoxazole -trimethoprim  (BACTRIM  DS) 800-160 MG tablet Take 1 tablet by mouth 2 (two) times daily. 05/15/24   Randol Dawes, MD    Allergies: Metformin  and related and Farxiga  [dapagliflozin ]    Review of Systems  Constitutional:  Negative for activity change, appetite change and fever.  HENT:  Negative for congestion and rhinorrhea.   Respiratory:  Positive for chest tightness and shortness of breath.   Cardiovascular:  Negative for chest pain.  Gastrointestinal:  Negative for nausea and vomiting.  Genitourinary:  Negative for dysuria.  Musculoskeletal:  Negative for arthralgias and myalgias.  Skin:  Negative for rash.  Neurological:  Negative for dizziness and weakness.    all other systems are negative except as noted in the HPI and PMH.   Updated Vital Signs BP 124/70   Pulse (!) 104   Temp 98 F (36.7 C) (Oral)   Resp 18   SpO2 99%   Physical Exam Vitals and nursing note reviewed.  Constitutional:      General: She is not in acute distress.    Appearance: She is well-developed. She is ill-appearing.     Comments: Dyspneic with conversation  HENT:     Head:  Normocephalic and atraumatic.     Mouth/Throat:     Pharynx: No oropharyngeal exudate.  Eyes:     Conjunctiva/sclera: Conjunctivae normal.     Pupils: Pupils are equal, round, and reactive to light.  Neck:     Comments: No meningismus. Cardiovascular:     Rate and Rhythm: Normal rate and regular rhythm.     Heart sounds: Normal heart sounds. No murmur heard. Pulmonary:     Effort: Pulmonary effort is normal. No respiratory distress.     Breath sounds: Rales present.  Abdominal:     Palpations: Abdomen is soft.     Tenderness: There is no abdominal tenderness. There is no guarding or rebound.  Musculoskeletal:        General: No tenderness. Normal range of motion.     Cervical back: Normal range of motion and neck supple.     Right lower leg: Edema present.     Left lower leg: Edema present.  Skin:    General: Skin is warm.  Neurological:     Mental Status: She is alert and oriented to person, place, and time.     Cranial Nerves: No cranial nerve deficit.     Motor: No abnormal muscle tone.     Coordination: Coordination normal.     Comments:  5/5 strength throughout. CN 2-12 intact.Equal grip strength.   Psychiatric:        Behavior: Behavior normal.     (all labs ordered are listed, but only abnormal results are displayed) Labs Reviewed  BASIC METABOLIC PANEL WITH GFR - Abnormal; Notable for the following components:      Result Value   Potassium 3.1 (*)    Glucose, Bld 198 (*)    Creatinine, Ser 1.09 (*)    GFR, Estimated 55 (*)    All other components within normal limits  CBC - Abnormal; Notable for the following components:   WBC 18.4 (*)    RBC 3.82 (*)    Hemoglobin 11.3 (*)    HCT 35.3 (*)    All other components within normal limits  PRO BRAIN NATRIURETIC PEPTIDE - Abnormal; Notable for the following components:   Pro Brain Natriuretic Peptide 5,847.0 (*)    All other components within normal limits  HEPATIC FUNCTION PANEL - Abnormal; Notable for the  following components:   Alkaline Phosphatase 131 (*)    Bilirubin, Direct 0.5 (*)    All other components within normal limits  D-DIMER, QUANTITATIVE - Abnormal; Notable for the following components:   D-Dimer, Quant 5.68 (*)    All other components within normal limits  TROPONIN T, HIGH SENSITIVITY - Abnormal; Notable for the following components:   Troponin T High Sensitivity 22 (*)    All other components within normal limits  TROPONIN T, HIGH  SENSITIVITY     EKG: None  Radiology: CT Angio Chest PE W and/or Wo Contrast Result Date: 07/03/2024 CLINICAL DATA:  Positive D-dimer and shortness of breath EXAM: CT ANGIOGRAPHY CHEST WITH CONTRAST TECHNIQUE: Multidetector CT imaging of the chest was performed using the standard protocol during bolus administration of intravenous contrast. Multiplanar CT image reconstructions and MIPs were obtained to evaluate the vascular anatomy. RADIATION DOSE REDUCTION: This exam was performed according to the departmental dose-optimization program which includes automated exposure control, adjustment of the mA and/or kV according to patient size and/or use of iterative reconstruction technique. CONTRAST:  75mL OMNIPAQUE  IOHEXOL  350 MG/ML SOLN COMPARISON:  Chest x-ray from earlier in the same day. FINDINGS: Cardiovascular: Thoracic aorta and its branches are within normal limits. Some atherosclerotic calcifications and soft atherosclerotic plaque is noted. No aneurysmal dilatation or dissection is noted. Heart is at the upper limits of normal in size. The pulmonary artery shows a normal branching pattern bilaterally. No filling defect to suggest pulmonary embolism is noted. Mediastinum/Nodes: Thoracic inlet is within normal limits. No hilar or mediastinal adenopathy is noted. The esophagus as visualized is within normal limits. Lungs/Pleura: Small right pleural effusion is seen. Emphysematous changes are identified. Mild interstitial edema is noted particularly in  the right lung similar to that seen on prior plain film examination. Similar changes but to a lesser degree are noted on the left. Upper Abdomen: Visualized upper abdomen shows no acute abnormality. Musculoskeletal: Degenerative changes of the thoracic spine are noted. Review of the MIP images confirms the above findings. IMPRESSION: No evidence of pulmonary emboli. Changes of CHF with interstitial edema. Aortic Atherosclerosis (ICD10-I70.0) and Emphysema (ICD10-J43.9). Electronically Signed   By: Oneil Devonshire M.D.   On: 07/03/2024 02:32   DG Chest Portable 1 View Result Date: 07/03/2024 CLINICAL DATA:  Shortness of breath. EXAM: PORTABLE CHEST 1 VIEW COMPARISON:  December 19, 2020 FINDINGS: The heart size and mediastinal contours are within normal limits. Mild to moderate severity diffusely increased interstitial lung markings are seen. This is increased in severity when compared to the prior study. Mild atelectatic changes are suspected within the bilateral lung bases. There is a small right pleural effusion. No pneumothorax is identified. The visualized skeletal structures are unremarkable. IMPRESSION: 1. Mild to moderate severity interstitial edema with mild bibasilar atelectasis. 2. Small right pleural effusion. Electronically Signed   By: Suzen Dials M.D.   On: 07/03/2024 01:40     .Critical Care  Performed by: Carita Senior, MD Authorized by: Carita Senior, MD   Critical care provider statement:    Critical care time (minutes):  45   Critical care time was exclusive of:  Separately billable procedures and treating other patients   Critical care was necessary to treat or prevent imminent or life-threatening deterioration of the following conditions:  Respiratory failure and cardiac failure   Critical care was time spent personally by me on the following activities:  Development of treatment plan with patient or surrogate, discussions with consultants, evaluation of patient's response  to treatment, examination of patient, ordering and review of laboratory studies, ordering and review of radiographic studies, ordering and performing treatments and interventions, pulse oximetry, re-evaluation of patient's condition, review of old charts, blood draw for specimens and obtaining history from patient or surrogate   I assumed direction of critical care for this patient from another provider in my specialty: no     Care discussed with: admitting provider      Medications Ordered in the ED -  No data to display                                  Medical Decision Making Amount and/or Complexity of Data Reviewed Labs: ordered. Decision-making details documented in ED Course. Radiology: ordered and independent interpretation performed. Decision-making details documented in ED Course. ECG/medicine tests: ordered and independent interpretation performed. Decision-making details documented in ED Course.  Risk Prescription drug management. Decision regarding hospitalization.   Difficulty breathing onset this afternoon worsening tonight with lying down.  No chest pain.  EKG is sinus rhythm without acute ST changes.  Hypoxic to 82% on arrival.  Placed on nasal cannula oxygen.  Crackles at bases.  EKG without acute ST changes.  Chest x-ray concerning for pulmonary edema and CHF.  IV Lasix  given.  Hypertensive.  Give IV hydralazine  as well Labs show elevated BNP.,  Hyperglycemia without evidence of DKA.  Troponin only elevated 22 with low suspicion for ACS. Leukocytosis of 18.  Denies chest pain.  Work of breathing has improved on nasal cannula oxygen.  Does not require BiPAP.  Plan admission for CHF exacerbation with new oxygen requirement of hypertensive urgency. Blood pressure slightly improved with hydralazine .  Will initiate IV nitroglycerin .  She denies chest pain.  Remains in no respiratory distress     Final diagnoses:  None    ED Discharge Orders     None           Melik Blancett, Garnette, MD 07/03/24 [phone]

## 2024-07-03 NOTE — H&P (Signed)
 History and Physical    Maria Foster DOB: 12/19/1955 DOA: 07/03/2024  PCP: Bulah Alm RAMAN, PA-C Patient coming from: Home.  Lives with husband.  Independently ambulates at baseline.  Chief Complaint: I could not breathe when I lay down  HPI:  Maria Foster is a 68 year old F with PMH of HFmrEF, NIDDM-2, OSA not compliant with CPAP, HTN, HLD, RA, prolonged QT and CKD-3A presenting with acute orthopnea.  Patient was in her usual state of health until last night when she she felt like she could not breathe when she tried to lie down.  Denies shortness of breath sitting up.  She denies dyspnea with exertion, chest pain, palpitation or dizziness.  She denies leg swelling, fever, cough, abdominal pain or UTI symptoms.  She felt nauseous with an episode of emesis.  Emesis was nonbloody and nonbilious.  She reports good compliance with her medications including Lasix .  She denies NSAID use or consuming a lot of salt.  It seems like the medication was reviewed by pharmacy on 8/5 and her Coreg  was increased to 25 mg twice daily due to elevated clinic BP of 168/120 that has improved to 148/92.  Reportedly, her home SBP ranges from 120s to 130s.  She carries history of whitecoat hypertension.  Patient lives with her husband.  Independently ambulates at baseline.  She denies smoking cigarettes, drinking alcohol or recreational drug use.   In ED, initial BP was 120/70.  Then SBP rose to 200s.  She received multiple rounds of IV hydralazine  and a dose of IV Lasix .  Also restarted on home Coreg , Entresto  and Imdur  with improvement in BP.  Current BP 150/69.  Saturation was 82% on RA but improved to upper 90s on 4 L.  Currently saturating in upper 90s on 2 L.   K3.1 and down to 2.9 after p.o. KCl 80. Cr 1.1 (baseline range from 1.1-1.3).  proBNP 5800.  WBC 18.4.  Hgb 11.3.  D-dimer 5.68.  Troponin 22 and 41.  EKG sinus rhythm with prolonged QT to 500s.  CT angio chest negative for PE but  concerning for CHF.  Admission requested for CHF exacerbation and accepted by my colleague overnight.  ROS All review of system negative except for pertinent positives and negatives as history of present illness above.  PMH 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   OU progressive, will medically need to have cataract surgery with lens will implantation so as to allow for complete clearance of the vitreous cavity in anticipation of likely vitrectomy in the future for progressive PDR despite previous PRP as delineated by the E DRS series of reports   Diabetes mellitus type 2 with complications (HCC)    diagnosed probably 2012 per patient   Diabetic retinopathy (HCC)    Hypertension    Rheumatoid arthritis (HCC)    Septic olecranon bursitis of right elbow 07/11/2021   PSH Past Surgical History:  Procedure Laterality Date   INCISION AND DRAINAGE ABSCESS N/A 07/11/2021   Procedure: INCISION AND DRAINAGE RIGHT ELBOW;  Surgeon: Ernie Cough, MD;  Location: [facility];  Service: Orthopedics;  Laterality: N/A;   PRP Bilateral OD - 07/23/2017 OS - 08/06/2017   RIGHT/LEFT HEART CATH AND CORONARY ANGIOGRAPHY N/A 01/05/2021   Procedure: RIGHT/LEFT HEART CATH AND CORONARY ANGIOGRAPHY;  Surgeon: Cherrie Toribio JONELLE, MD;  Location: [facility];  Service: Cardiovascular;  Laterality: N/A;  TEE WITHOUT CARDIOVERSION N/A 10/09/2021   Procedure: TRANSESOPHAGEAL ECHOCARDIOGRAM (TEE);  Surgeon: Cherrie Toribio SAUNDERS, MD;  Location: [facility] ENDOSCOPY;  Service: Cardiovascular;  Laterality: N/A;   Fam HX Family History  Problem Relation Age of Onset   Heart attack Mother    Heart failure Mother    Diabetes Mother    Hypertension Mother    Heart failure Sister    Heart Problems Sister    Heart failure Sister    Hypertension Daughter    Amblyopia Neg Hx    Blindness Neg Hx    Cataracts Neg Hx    Glaucoma Neg Hx    Macular  degeneration Neg Hx    Retinal detachment Neg Hx    Strabismus Neg Hx    Retinitis pigmentosa Neg Hx     Social Hx  reports that she quit smoking about 21 months ago. Her smoking use included cigarettes. She started smoking about 16 years ago. She has a 7.5 pack-year smoking history. She has been exposed to tobacco smoke. She has never used smokeless tobacco. She reports that she does not currently use alcohol. She reports that she does not use drugs.  Allergy Allergies  Allergen Reactions   Metformin  And Related Other (See Comments)    Kidney injury, GI upset, diarrhea   Farxiga  [Dapagliflozin ] Other (See Comments)    Yeast infections, recurrent   Home Meds Prior to Admission medications   Medication Sig Start Date End Date Taking? Authorizing Provider  aspirin  EC 81 MG tablet Take 81 mg by mouth daily.    [provider]  atorvastatin  (LIPITOR) 40 MG tablet Take 1 tablet (40 mg total) by mouth daily. 02/25/24   Vannie Reche RAMAN, NP  carvedilol  (COREG ) 12.5 MG tablet Take 1 tablet (12.5 mg total) by mouth 2 (two) times daily. 02/25/24   Vannie Reche RAMAN, NP  ENTRESTO  97-103 MG TAKE 1 TABLET BY MOUTH TWICE DAILY 06/04/24   Walker, Caitlin S, NP  ferrous gluconate  (FERGON) 324 MG tablet Take 1 tablet (324 mg total) by mouth daily with breakfast. 06/04/23   Tysinger, Alm RAMAN, PA-C  furosemide  (LASIX ) 40 MG tablet Take 1 tablet (40 mg total) by mouth daily. 02/25/24   Vannie Reche RAMAN, NP  glipiZIDE  (GLUCOTROL ) 5 MG tablet Take 1 tablet (5 mg total) by mouth 2 (two) times daily before a meal. 12/17/23   Tysinger, Alm RAMAN, PA-C  hydrALAZINE  (APRESOLINE ) 100 MG tablet Take 1 tablet (100 mg total) by mouth 3 (three) times daily. 02/25/24   Vannie Reche RAMAN, NP  isosorbide  mononitrate (IMDUR ) 60 MG 24 hr tablet Take 1 tablet (60 mg total) by mouth daily. 02/25/24   Walker, Caitlin S, NP  latanoprost (XALATAN) 0.005 % ophthalmic solution Place 1 drop into both eyes at bedtime. Patient not  taking: Reported on 05/11/2024    [provider]  leflunomide  (ARAVA ) 20 MG tablet Take 1 tablet (20 mg total) by mouth daily. 03/02/24   Rice, Lonni ORN, MD  linagliptin  (TRADJENTA ) 5 MG TABS tablet Take 1 tablet (5 mg total) by mouth daily. 12/17/23   Tysinger, Alm RAMAN, PA-C  methocarbamol  (ROBAXIN ) 500 MG tablet Take 1-2 tablets (500-1,000 mg total) by mouth every 8 (eight) hours as needed for muscle spasms. 05/11/24   Randol Dawes, MD  spironolactone  (ALDACTONE ) 50 MG tablet Take 1 tablet (50 mg total) by mouth daily. 03/17/24   Raford Riggs, MD  sulfamethoxazole -trimethoprim  (BACTRIM  DS) 800-160 MG tablet Take 1 tablet by mouth 2 (two) times  daily. 05/15/24   Randol Dawes, MD    Physical Exam: Vitals:   07/03/24 1445 07/03/24 1500 07/03/24 1522 07/03/24 1615  BP: (!) 161/58  (!) 142/56 (!) 150/69  Pulse: 67  81 72  Resp: 18  18 19   Temp:  98.6 F (37 C)  98.4 F (36.9 C)  TempSrc:  Oral  Oral  SpO2: 99%  100% 99%  Weight:    71.9 kg  Height:    5' 3 (1.6 m)    GENERAL: No acute distress.  Appears well.  HEENT: MMM.  Vision and hearing grossly intact.  NECK: Supple.  Notable JVD. RESP:  No IWOB. Good air movement bilaterally. CVS:  RRR. Heart sounds normal.  ABD/GI/GU: Bowel sounds present. Soft. Non tender.  MSK/EXT:  Moves extremities.   Deformities in the fingers from RA.  Trace pedal edema. SKIN: Masses on bilateral extremities from RA. NEURO: Awake, alert and oriented appropriately.  No gross deficit.  PSYCH: Calm. Normal affect.   Personally Reviewed Radiological Exams See HPI   Personally Reviewed Labs: CBC: Recent Labs  Lab 07/03/24 0118  WBC 18.4*  HGB 11.3*  HCT 35.3*  MCV 92.4  PLT 199   Basic Metabolic Panel: Recent Labs  Lab 07/03/24 0118 07/03/24 1447  NA 143 141  K 3.1* 2.9*  CL 105 102  CO2 23 25  GLUCOSE 198* 165*  BUN 15 14  CREATININE 1.09* 1.15*  CALCIUM  9.1 8.6*   GFR: Estimated Creatinine Clearance: 44.5 mL/min (A)  (by C-G formula based on SCr of 1.15 mg/dL (H)). Liver Function Tests: Recent Labs  Lab 07/03/24 0118  AST 15  ALT 6  ALKPHOS 131*  BILITOT 1.0  PROT 7.5  ALBUMIN 4.0   No results for input(s): LIPASE, AMYLASE in the last 168 hours. No results for input(s): AMMONIA in the last 168 hours. Coagulation Profile: No results for input(s): INR, PROTIME in the last 168 hours. Cardiac Enzymes: No results for input(s): CKTOTAL, CKMB, CKMBINDEX, TROPONINI in the last 168 hours. BNP (last 3 results) Recent Labs    07/03/24 0118  PROBNP 5,847.0*   HbA1C: No results for input(s): HGBA1C in the last 72 hours. CBG: Recent Labs  Lab 07/03/24 0450 07/03/24 1334  GLUCAP 222* 188*   Lipid Profile: No results for input(s): CHOL, HDL, LDLCALC, TRIG, CHOLHDL, LDLDIRECT in the last 72 hours. Thyroid  Function Tests: No results for input(s): TSH, T4TOTAL, FREET4, T3FREE, THYROIDAB in the last 72 hours. Anemia Panel: No results for input(s): VITAMINB12, FOLATE, FERRITIN, TIBC, IRON , RETICCTPCT in the last 72 hours. Urine analysis:    Component Value Date/Time   COLORURINE YELLOW 10/29/2022 1930   APPEARANCEUR HAZY (A) 10/29/2022 1930   LABSPEC 1.010 11/09/2022 1211   PHURINE 6.0 10/29/2022 1930   GLUCOSEU >1,000 (A) 10/29/2022 1930   HGBUR MODERATE (A) 10/29/2022 1930   BILIRUBINUR negative 05/11/2024 1522   KETONESUR negative 05/11/2024 1522   KETONESUR 40 (A) 10/29/2022 1930   PROTEINUR trace (A) 11/09/2022 1211   PROTEINUR 30 (A) 10/29/2022 1930   UROBILINOGEN 0.2 05/11/2024 1522   UROBILINOGEN 1.0 10/17/2009 1021   NITRITE Negative 05/11/2024 1522   NITRITE POSITIVE (A) 10/29/2022 1930   LEUKOCYTESUR Moderate (2+) (A) 05/11/2024 1522   LEUKOCYTESUR NEGATIVE 10/29/2022 1930     Assessment and plan: Acute on chronic HFmrEF: Patient presents with acute orthopnea.  Unclear if this is decompensation due to recent increment of  Coreg .  proBNP elevated to 5800.  Overall, does not look fluid  overloaded except for JVD and new oxygen requirement.  This was about 12 hours after she received IV Lasix .  TTE in 10/2022 with LVEF of 45 to 50%, GH and G1 DD.  Patient reports compliance with home medications.  BP was normal when she arrived in ED but SBP went up to 200s later on.  Maybe she did not take her evening medication.  He also carries history of whitecoat hypertension -Continue IV Lasix  40 mg twice daily -Continue home Coreg , Entresto , Imdur  and Aldactone  -Update echocardiogram -Strict intake and output, daily weight, renal functions and electrolytes - May involve cardiology based on echo result and clinical status.  Uncontrolled hypertension: SBP went from 120s on arrival to 200s while in ED. could be from not taking her evening medication.  Carries history of white coat hypertension - Continue home antihypertensive meds and IV Lasix  - IV labetalol  as needed  Hypoxemia: Likely due to #1.  Currently saturating in upper 90s on 2 L. - Wean oxygen as able - Diuretics as above  Nausea and vomiting: Due to CHF?  No abdominal pain.  Abdominal exam benign.  Has leukocytosis but no fever or other signs of infection.  No emesis in over 12 hours now.  Would avoid Zofran  due to prolonged QT  NIDDM-2 with hyperglycemia: A1c 7.2% in 3/25.  On glipizide  and Tradjenta  at home. Recent Labs  Lab 07/03/24 0450 07/03/24 1334  GLUCAP 222* 188*  - SSI-sensitive - May consider switching glipizide  to Farxiga  before discharge  OSA not on CPAP: Not compliant with CPAP at home.  Does not want to be  Rheumatoid arthritis - Continue home Arava  after med rec  Hypokalemia -Monitor replenish K and Mg as appropriate  Prolonged QT: QTc about 500. - Replenish K and Mg aggressively - Minimize QT prolonging drugs  Elevated troponin/demand ischemia - Manage CHF and hypertension as above  Elevated D-dimer: CT angio negative for PE. -  Check lower extremity Dopplers to rule out DVT  Hyperlipidemia - Continue home Lipitor  Leukocytosis: No clear source of infection.  Does not seem to be on steroid.  Demargination? -Recheck in the morning. -Hold off antibiotics  DVT prophylaxis: Subcu Lovenox   Code Status: Full code-discussed with patient Family Communication: Updated patient's daughter at patient's request  Consults called: None Admission status: Inpatient   Mignon ONEIDA Bump MD [facility]  If 7PM-7AM, please contact night-coverage [email or web address]  07/03/2024, 4:57 PM

## 2024-07-03 NOTE — ED Notes (Signed)
 Called [facility] to transport patient to [id number]

## 2024-07-03 NOTE — ED Provider Notes (Signed)
 Patient admitted for CHF exacerbation.  She reports she is feeling a lot better than when she came to the hospital. Physical Exam  BP (!) 152/67   Pulse 64   Temp 98.6 F (37 C) (Oral)   Resp 18   SpO2 97%   Physical Exam Constitutional:      Comments: Patient is alert.  Respirations nonlabored.  Speaking in full sentences.  Cardiovascular:     Rate and Rhythm: Normal rate and regular rhythm.  Pulmonary:     Comments: No respiratory distress.  Slight crackle in the bases and mid field on the right.  Lung is grossly clear on the left. Skin:    General: Skin is warm and dry.     Procedures  Procedures  ED Course / MDM    Medical Decision Making Amount and/or Complexity of Data Reviewed Labs: ordered. Radiology: ordered. ECG/medicine tests: ordered.  Risk OTC drugs. Prescription drug management. Decision regarding hospitalization.   Patient is awaiting her inpatient bed.  She is comfortable in appearance.  I will reorder a dose of IV Lasix .  Also reordered home medications.  And 1 dose of IV hydralazine .       Armenta Canning, MD 07/03/24 1352

## 2024-07-03 NOTE — Plan of Care (Signed)
   Problem: Education: Goal: Knowledge of General Education information will improve Description Including pain rating scale, medication(s)/side effects and non-pharmacologic comfort measures Outcome: Progressing   Problem: Health Behavior/Discharge Planning: Goal: Ability to manage health-related needs will improve Outcome: Progressing

## 2024-07-03 NOTE — ED Triage Notes (Signed)
 Pt POV reporting increased SOB that began today, worsens when laying down. 82% RA in triage.

## 2024-07-03 NOTE — Plan of Care (Signed)
  Problem: Fluid Volume: Goal: Ability to maintain a balanced intake and output will improve 07/03/2024 1750 by Carlin Raspberry, RN Outcome: Progressing 07/03/2024 1729 by Carlin Raspberry, RN Outcome: Progressing   Problem: Health Behavior/Discharge Planning: Goal: Ability to identify and utilize available resources and services will improve 07/03/2024 1750 by Carlin Raspberry, RN Outcome: Progressing 07/03/2024 1729 by Carlin Raspberry, RN Outcome: Progressing

## 2024-07-04 ENCOUNTER — Inpatient Hospital Stay (HOSPITAL_COMMUNITY)

## 2024-07-04 DIAGNOSIS — I5021 Acute systolic (congestive) heart failure: Secondary | ICD-10-CM | POA: Diagnosis not present

## 2024-07-04 DIAGNOSIS — I5043 Acute on chronic combined systolic (congestive) and diastolic (congestive) heart failure: Secondary | ICD-10-CM | POA: Diagnosis not present

## 2024-07-04 DIAGNOSIS — R7989 Other specified abnormal findings of blood chemistry: Secondary | ICD-10-CM | POA: Diagnosis not present

## 2024-07-04 LAB — CBC WITH DIFFERENTIAL/PLATELET
Abs Immature Granulocytes: 0.05 K/uL (ref 0.00–0.07)
Basophils Absolute: 0.1 K/uL (ref 0.0–0.1)
Basophils Relative: 1 %
Eosinophils Absolute: 0.1 K/uL (ref 0.0–0.5)
Eosinophils Relative: 1 %
HCT: 31.2 % — ABNORMAL LOW (ref 36.0–46.0)
Hemoglobin: 10 g/dL — ABNORMAL LOW (ref 12.0–15.0)
Immature Granulocytes: 1 %
Lymphocytes Relative: 10 %
Lymphs Abs: 1 K/uL (ref 0.7–4.0)
MCH: 29.2 pg (ref 26.0–34.0)
MCHC: 32.1 g/dL (ref 30.0–36.0)
MCV: 91.2 fL (ref 80.0–100.0)
Monocytes Absolute: 0.9 K/uL (ref 0.1–1.0)
Monocytes Relative: 9 %
Neutro Abs: 7.7 K/uL (ref 1.7–7.7)
Neutrophils Relative %: 78 %
Platelets: 175 K/uL (ref 150–400)
RBC: 3.42 MIL/uL — ABNORMAL LOW (ref 3.87–5.11)
RDW: 14.1 % (ref 11.5–15.5)
WBC: 9.7 K/uL (ref 4.0–10.5)
nRBC: 0 % (ref 0.0–0.2)

## 2024-07-04 LAB — ECHOCARDIOGRAM COMPLETE
AR max vel: 2.1 cm2
AV Area VTI: 2.25 cm2
AV Area mean vel: 2.07 cm2
AV Mean grad: 6 mmHg
AV Peak grad: 10.6 mmHg
Ao pk vel: 1.63 m/s
Area-P 1/2: 3.72 cm2
Height: 63 in
S' Lateral: 2.7 cm
Weight: 2543.23 [oz_av]

## 2024-07-04 LAB — GLUCOSE, CAPILLARY
Glucose-Capillary: 128 mg/dL — ABNORMAL HIGH (ref 70–99)
Glucose-Capillary: 154 mg/dL — ABNORMAL HIGH (ref 70–99)
Glucose-Capillary: 169 mg/dL — ABNORMAL HIGH (ref 70–99)
Glucose-Capillary: 169 mg/dL — ABNORMAL HIGH (ref 70–99)

## 2024-07-04 LAB — BASIC METABOLIC PANEL WITH GFR
Anion gap: 10 (ref 5–15)
BUN: 14 mg/dL (ref 8–23)
CO2: 26 mmol/L (ref 22–32)
Calcium: 8.4 mg/dL — ABNORMAL LOW (ref 8.9–10.3)
Chloride: 104 mmol/L (ref 98–111)
Creatinine, Ser: 1.22 mg/dL — ABNORMAL HIGH (ref 0.44–1.00)
GFR, Estimated: 48 mL/min — ABNORMAL LOW (ref >60)
Glucose, Bld: 96 mg/dL (ref 70–99)
Potassium: 4.1 mmol/L (ref 3.5–5.1)
Sodium: 140 mmol/L (ref 135–145)

## 2024-07-04 LAB — MAGNESIUM: Magnesium: 1.7 mg/dL (ref 1.7–2.4)

## 2024-07-04 MED ORDER — FUROSEMIDE 10 MG/ML IJ SOLN
40.0000 mg | Freq: Every day | INTRAMUSCULAR | Status: DC
  Filled 2024-07-04: qty 4

## 2024-07-04 MED ORDER — LEFLUNOMIDE 10 MG PO TABS
20.0000 mg | ORAL_TABLET | Freq: Every day | ORAL | Status: DC
  Administered 2024-07-04 – 2024-07-05 (×2): 20 mg via ORAL
  Filled 2024-07-04 (×3): qty 2

## 2024-07-04 MED ORDER — MAGNESIUM SULFATE 2 GM/50ML IV SOLN
2.0000 g | Freq: Once | INTRAVENOUS | Status: AC
  Administered 2024-07-04: 2 g via INTRAVENOUS
  Filled 2024-07-04: qty 50

## 2024-07-04 NOTE — Progress Notes (Signed)
 PROGRESS NOTE  Maria Foster FMW:[id number] DOB: 15-Oct-1956   PCP: Bulah Alm RAMAN, PA-C  Patient is from: Home.  Lives with husband.  Independently ambulates at baseline.  DOA: 07/03/2024 LOS: 1  Chief complaints Chief Complaint  Patient presents with   Shortness of Breath     Brief Narrative / Interim history: 68 year old F with PMH of HFmrEF, NIDDM-2, OSA not compliant with CPAP, HTN, HLD, RA, prolonged QT and CKD-3A presenting with acute orthopnea, and admitted for acute CHF and hypoxic respiratory failure.  In ED, initial BP was 120/70.  Then SBP rose to 200s.  She received multiple rounds of IV hydralazine  and a dose of IV Lasix .  Also restarted on home Coreg , Entresto  and Imdur  with improvement in BP.  Saturation was 82% on RA but improved to upper 90s on 4 L.  K3.1.  proBNP 5800.  WBC 18.4.  Hgb 11.3.  D-dimer 5.68.  Troponin 22 and 41.  EKG sinus rhythm with prolonged QT to 500s.  CXR with mild to moderate severity interstitial edema and small right pleural effusion.  CT angio chest negative for PE but concerning for CHF.   Continued on IV Lasix .  TTE and lower extremity venous Doppler pending.    Subjective: Seen and examined earlier this morning.  No major events overnight or this morning.  No complaints.  Denies shortness of breath, orthopnea, chest pain, dizziness, nausea or vomiting.  Patient's son and daughter-in-law at bedside.  Objective: Vitals:   07/03/24 1951 07/04/24 0002 07/04/24 0523 07/04/24 0826  BP: 127/62 (!) 122/59 (!) 148/73 (!) 144/71  Pulse: 84 77 74 78  Resp: 19 20 19 20   Temp: 98.4 F (36.9 C) 98.8 F (37.1 C) 98.2 F (36.8 C) 98 F (36.7 C)  TempSrc:  Oral Oral Oral  SpO2: 97% 100% 96% 98%  Weight:   72.1 kg   Height:        Examination:  GENERAL: No acute distress.  Appears well.  HEENT: MMM.  Vision and hearing grossly intact.  NECK: Supple.  Notable JVD. RESP:  No IWOB. Good air movement bilaterally. CVS:  RRR. Heart sounds  normal.  ABD/GI/GU: Bowel sounds present. Soft. Non tender.  MSK/EXT:  Moves extremities.   Deformities in the fingers from RA.  Trace pedal edema. SKIN: Masses on bilateral extremities from RA. NEURO: Awake, alert and oriented appropriately.  No gross deficit.  PSYCH: Calm. Normal affect.   Consultants:  None  Procedures: None  Microbiology summarized: None  Assessment and plan: Acute on chronic HFmrEF: Presents with orthopnea.  Unclear if this is decompensation due to recent increment of Coreg .  Reports compliance with her meds.  proBNP elevated to 5800.  TTE in 10/2022 with LVEF of 45 to 50%, GH and G1-DD.  Started on IV Lasix .  Creatinine slightly up but at baseline. -Decrease IV Lasix  to 40 mg daily -Continue home Coreg , Entresto , Imdur , hydralazine  and Aldactone  -Did not tolerate SGLT2 inhibitors due to recurrent yeast infection in the past -Follow echocardiogram -Strict intake and output, daily weight, renal functions and electrolytes -May involve cardiology based on echo result and clinical status.   Uncontrolled hypertension: SBP went from 120s on arrival to 200s while in ED. BP improved.  SBP in 140s. - Continue home antihypertensive meds and IV Lasix  - IV labetalol  as needed   Hypoxemia: Likely due to #1.  Currently saturating in upper 90s on 2 L. - Wean oxygen as able - Diuretics as above -Ambulatory saturation  Nausea and vomiting: Due to CHF?  No abdominal pain.  Abdominal exam benign.  Resolved.   NIDDM-2 with hyperglycemia: A1c 7.2% in 3/25.  On glipizide  and Tradjenta  at home. Recent Labs  Lab 07/03/24 0450 07/03/24 1334 07/03/24 2056 07/04/24 0540 07/04/24 1122  GLUCAP 222* 188* 233* 128* 154*  - Continue SSI-sensitive   OSA not on CPAP: Not compliant with CPAP at home.  Does not want to be   Rheumatoid arthritis - Resume home Arava .   Hypokalemia/hypomagnesemia - Keep K > 4.0 and Mg > 2.0.   Prolonged QT: QTc about 500. - Replenish K and Mg  aggressively - Minimize QT prolonging drugs -Repeat EKG today   Elevated troponin/demand ischemia - Manage CHF and hypertension as above -Follow echocardiogram   Elevated D-dimer: CT angio negative for PE.  LE venous Doppler negative for DVT.  Inguinal lymphadenopathy: Incidental finding: Lower extremity venous Doppler.  Cell counts without significant finding other than mild anemia. -Outpatient follow-up with PCP and/or hematology   Hyperlipidemia - Continue home Lipitor   Leukocytosis: No clear source of infection.  Resolved without antibiotics.   Body mass index is 28.16 kg/m.           DVT prophylaxis:  enoxaparin  (LOVENOX ) injection 40 mg Start: 07/03/24 1745  Code Status: Full code Family Communication: Updated patient's son and daughter-in-law at bedside Level of care: Telemetry Cardiac Status is: Inpatient Remains inpatient appropriate because: Acute CHF   Final disposition: Likely home   For 5 minutes with more than 50% spent in reviewing records, counseling patient/family and coordinating care.   Sch Meds:  Scheduled Meds:  aspirin  EC  81 mg Oral Daily   atorvastatin   40 mg Oral Daily   carvedilol   12.5 mg Oral BID   enoxaparin  (LOVENOX ) injection  40 mg Subcutaneous Q24H   ferrous gluconate   324 mg Oral Q breakfast   [START ON 07/05/2024] furosemide   40 mg Intravenous Daily   hydrALAZINE   100 mg Oral TID   insulin  aspart  0-5 Units Subcutaneous QHS   insulin  aspart  0-9 Units Subcutaneous TID WC   isosorbide  mononitrate  60 mg Oral Daily   sacubitril -valsartan   1 tablet Oral BID   sodium chloride  flush  3 mL Intravenous Q12H   spironolactone   50 mg Oral Daily   Continuous Infusions:  sodium chloride      PRN Meds:.sodium chloride , acetaminophen , labetalol , sodium chloride  flush  Antimicrobials: Anti-infectives (From admission, onward)    None        I have personally reviewed the following labs and images: CBC: Recent Labs  Lab  07/03/24 0118 07/04/24 0245  WBC 18.4* 9.7  NEUTROABS  --  7.7  HGB 11.3* 10.0*  HCT 35.3* 31.2*  MCV 92.4 91.2  PLT 199 175   BMP &GFR Recent Labs  Lab 07/03/24 0118 07/03/24 1447 07/03/24 1730 07/04/24 0245  NA 143 141  --  140  K 3.1* 2.9*  --  4.1  CL 105 102  --  104  CO2 23 25  --  26  GLUCOSE 198* 165*  --  96  BUN 15 14  --  14  CREATININE 1.09* 1.15*  --  1.22*  CALCIUM  9.1 8.6*  --  8.4*  MG  --   --  1.6* 1.7   Estimated Creatinine Clearance: 42 mL/min (A) (by C-G formula based on SCr of 1.22 mg/dL (H)). Liver & Pancreas: Recent Labs  Lab 07/03/24 0118  AST 15  ALT 6  ALKPHOS 131*  BILITOT 1.0  PROT 7.5  ALBUMIN 4.0   No results for input(s): LIPASE, AMYLASE in the last 168 hours. No results for input(s): AMMONIA in the last 168 hours. Diabetic: No results for input(s): HGBA1C in the last 72 hours. Recent Labs  Lab 07/03/24 0450 07/03/24 1334 07/03/24 2056 07/04/24 0540 07/04/24 1122  GLUCAP 222* 188* 233* 128* 154*   Cardiac Enzymes: No results for input(s): CKTOTAL, CKMB, CKMBINDEX, TROPONINI in the last 168 hours. Recent Labs    07/03/24 0118  PROBNP 5,847.0*   Coagulation Profile: No results for input(s): INR, PROTIME in the last 168 hours. Thyroid  Function Tests: No results for input(s): TSH, T4TOTAL, FREET4, T3FREE, THYROIDAB in the last 72 hours. Lipid Profile: No results for input(s): CHOL, HDL, LDLCALC, TRIG, CHOLHDL, LDLDIRECT in the last 72 hours. Anemia Panel: No results for input(s): VITAMINB12, FOLATE, FERRITIN, TIBC, IRON , RETICCTPCT in the last 72 hours. Urine analysis:    Component Value Date/Time   COLORURINE YELLOW 10/29/2022 1930   APPEARANCEUR HAZY (A) 10/29/2022 1930   LABSPEC 1.010 11/09/2022 1211   PHURINE 6.0 10/29/2022 1930   GLUCOSEU >1,000 (A) 10/29/2022 1930   HGBUR MODERATE (A) 10/29/2022 1930   BILIRUBINUR negative 05/11/2024 1522   KETONESUR  negative 05/11/2024 1522   KETONESUR 40 (A) 10/29/2022 1930   PROTEINUR trace (A) 11/09/2022 1211   PROTEINUR 30 (A) 10/29/2022 1930   UROBILINOGEN 0.2 05/11/2024 1522   UROBILINOGEN 1.0 10/17/2009 1021   NITRITE Negative 05/11/2024 1522   NITRITE POSITIVE (A) 10/29/2022 1930   LEUKOCYTESUR Moderate (2+) (A) 05/11/2024 1522   LEUKOCYTESUR NEGATIVE 10/29/2022 1930   Sepsis Labs: Invalid input(s): PROCALCITONIN, LACTICIDVEN  Microbiology: No results found for this or any previous visit (from the past 240 hours).  Radiology Studies: VAS US  LOWER EXTREMITY VENOUS (DVT) Result Date: 07/04/2024  Lower Venous DVT Study Patient Name:  LUTRICIA WIDJAJA  Date of Exam:   07/04/2024 Medical Rec #: [id number]        Accession #:    [id number] Date of Birth: [birth year]        Patient Gender: F Patient Age:   34 years Exam Location:  [facility] Procedure:      VAS US  LOWER EXTREMITY VENOUS (DVT) Referring Phys: MIGNON Lavina Resor --------------------------------------------------------------------------------  Indications: Elevated D-dimer (5.68).  Limitations: Poor ultrasound/tissue interface. Comparison Study: No previous exams Performing Technologist: Jody Hill RVT, RDMS  Examination Guidelines: A complete evaluation includes B-mode imaging, spectral Doppler, color Doppler, and power Doppler as needed of all accessible portions of each vessel. Bilateral testing is considered an integral part of a complete examination. Limited examinations for reoccurring indications may be performed as noted. The reflux portion of the exam is performed with the patient in reverse Trendelenburg.  +---------+---------------+---------+-----------+----------+--------------+ RIGHT    CompressibilityPhasicitySpontaneityPropertiesThrombus Aging +---------+---------------+---------+-----------+----------+--------------+ CFV      Full           Yes      Yes                                  +---------+---------------+---------+-----------+----------+--------------+ SFJ      Full                                                        +---------+---------------+---------+-----------+----------+--------------+  FV Prox  Full           Yes      Yes                                 +---------+---------------+---------+-----------+----------+--------------+ FV Mid   Full           Yes      Yes                                 +---------+---------------+---------+-----------+----------+--------------+ FV DistalFull           Yes      Yes                                 +---------+---------------+---------+-----------+----------+--------------+ PFV      Full                                                        +---------+---------------+---------+-----------+----------+--------------+ POP      Full           Yes      Yes                                 +---------+---------------+---------+-----------+----------+--------------+ PTV      Full                                                        +---------+---------------+---------+-----------+----------+--------------+ PERO     Full                                                        +---------+---------------+---------+-----------+----------+--------------+   +---------+---------------+---------+-----------+----------+--------------+ LEFT     CompressibilityPhasicitySpontaneityPropertiesThrombus Aging +---------+---------------+---------+-----------+----------+--------------+ CFV      Full           Yes      Yes                                 +---------+---------------+---------+-----------+----------+--------------+ SFJ      Full                                                        +---------+---------------+---------+-----------+----------+--------------+ FV Prox  Full           Yes      Yes                                  +---------+---------------+---------+-----------+----------+--------------+ FV Mid   Full  Yes      Yes                                 +---------+---------------+---------+-----------+----------+--------------+ FV DistalFull           Yes      Yes                                 +---------+---------------+---------+-----------+----------+--------------+ PFV      Full                                                        +---------+---------------+---------+-----------+----------+--------------+ POP      Full           Yes      Yes                                 +---------+---------------+---------+-----------+----------+--------------+ PTV      Full                                                        +---------+---------------+---------+-----------+----------+--------------+ PERO     Full                                                        +---------+---------------+---------+-----------+----------+--------------+    Summary: BILATERAL: - No evidence of deep vein thrombosis seen in the lower extremities, bilaterally. -No evidence of popliteal cyst, bilaterally. - Ultrasound characteristics of enlarged lymph nodes are noted in the groin, bilaterally.   *See table(s) above for measurements and observations.    Preliminary       Elorah Dewing T. Jashawna Reever [facility]  If 7PM-7AM, please contact night-coverage [email or web address] 07/04/2024, 11:46 AM

## 2024-07-04 NOTE — Plan of Care (Signed)
   Problem: Clinical Measurements: Goal: Ability to maintain clinical measurements within normal limits will improve Outcome: Progressing

## 2024-07-04 NOTE — Progress Notes (Signed)
 Echocardiogram 2D Echocardiogram has been performed.  Yulianna Folse N Joell Usman,RDCS 07/04/2024, 1:13 PM

## 2024-07-04 NOTE — Plan of Care (Signed)
  Problem: Coping: Goal: Level of anxiety will decrease Outcome: Completed/Met   Problem: Elimination: Goal: Will not experience complications related to bowel motility Outcome: Completed/Met Goal: Will not experience complications related to urinary retention Outcome: Completed/Met   Problem: Pain Managment: Goal: General experience of comfort will improve and/or be controlled Outcome: Completed/Met   Problem: Safety: Goal: Ability to remain free from injury will improve Outcome: Completed/Met   Problem: Skin Integrity: Goal: Risk for impaired skin integrity will decrease Outcome: Completed/Met   Problem: Nutritional: Goal: Maintenance of adequate nutrition will improve Outcome: Completed/Met Goal: Progress toward achieving an optimal weight will improve Outcome: Completed/Met   Problem: Skin Integrity: Goal: Risk for impaired skin integrity will decrease Outcome: Completed/Met

## 2024-07-05 DIAGNOSIS — I5043 Acute on chronic combined systolic (congestive) and diastolic (congestive) heart failure: Secondary | ICD-10-CM | POA: Diagnosis not present

## 2024-07-05 LAB — BASIC METABOLIC PANEL WITH GFR
Anion gap: 11 (ref 5–15)
BUN: 15 mg/dL (ref 8–23)
CO2: 25 mmol/L (ref 22–32)
Calcium: 8.4 mg/dL — ABNORMAL LOW (ref 8.9–10.3)
Chloride: 102 mmol/L (ref 98–111)
Creatinine, Ser: 1.27 mg/dL — ABNORMAL HIGH (ref 0.44–1.00)
GFR, Estimated: 46 mL/min — ABNORMAL LOW (ref >60)
Glucose, Bld: 127 mg/dL — ABNORMAL HIGH (ref 70–99)
Potassium: 3.6 mmol/L (ref 3.5–5.1)
Sodium: 138 mmol/L (ref 135–145)

## 2024-07-05 LAB — MAGNESIUM: Magnesium: 2.1 mg/dL (ref 1.7–2.4)

## 2024-07-05 LAB — GLUCOSE, CAPILLARY: Glucose-Capillary: 115 mg/dL — ABNORMAL HIGH (ref 70–99)

## 2024-07-05 MED ORDER — FUROSEMIDE 40 MG PO TABS: 40.0000 mg | ORAL_TABLET | Freq: Every day | ORAL | 3 refills | Status: DC

## 2024-07-05 MED ORDER — FUROSEMIDE 40 MG PO TABS
40.0000 mg | ORAL_TABLET | Freq: Every day | ORAL | Status: DC
  Administered 2024-07-05: 40 mg via ORAL
  Filled 2024-07-05: qty 1

## 2024-07-05 NOTE — Discharge Summary (Signed)
 Physician Discharge Summary  Maria Foster FMW:[id number] DOB: 31-Dec-1955 DOA: 07/03/2024  PCP: Bulah Alm RAMAN, PA-C  Admit date: 07/03/2024 Discharge date: 07/05/2024  Time spent: 45 minutes  Recommendations for Outpatient Follow-up:  PCP in 1 week   Discharge Diagnoses:  Principal Problem:   Acute on chronic combined systolic and diastolic CHF (congestive heart failure) (HCC) Uncontrolled hypertension Acute hypoxic respiratory failure Type 2 diabetes mellitus OSA Rheumatoid arthritis Hypokalemia Elevated troponin   Discharge Condition: improved  Diet recommendation: Low sodium, heart healthy, diabetic  Filed Weights   07/03/24 1615 07/04/24 0523 07/05/24 0440  Weight: 71.9 kg 72.1 kg 71.5 kg    History of present illness:  68 year old F with PMH of HFmrEF, NIDDM-2, OSA not compliant with CPAP, HTN, HLD, RA, prolonged QT and CKD-3A presenting with acute orthopnea, and admitted for acute CHF and hypoxic respiratory failure. In ED, initial BP was 120/70.  Then SBP rose to 200s.  She received multiple rounds of IV hydralazine  and a dose of IV Lasix .  Also restarted on home Coreg , Entresto  and Imdur  with improvement in BP.  Saturation was 82% on RA but improved to upper 90s on 4 L.  K3.1.  proBNP 5800.  WBC 18.4.  Hgb 11.3.  D-dimer 5.68.  Troponin 22 and 41.  EKG sinus rhythm with prolonged QT to 500s.  CXR with mild to moderate severity interstitial edema and small right pleural effusion.  CT angio chest negative for PE but concerning for CHF.   Hospital Course:   Acute on chronic HFmrEF:  - Presented with dyspnea and orthopnea presents with orthopnea.   Reports compliance with her meds.  proBNP elevated to 5800.  TTE in 10/2022 with LVEF of 45 to 50%, GH and G1-DD.   -Improved with diuresis, 6 to 7 L negative, switched back to oral Lasix  40 mg daily --Continue home Coreg , Entresto , Imdur , hydralazine  and Aldactone  -Did not tolerate SGLT2 inhibitors due to recurrent yeast  infection in the past - Repeat echo with EF improved to 70-75%, grade 1 DD, normal RV -Recommended close follow-up with cardiology, advised to monitor weight daily and take additional Lasix  dose for weight gain   Uncontrolled hypertension: SBP went from 120s on arrival to 200s while in ED. - With diuresis, meds as above   Acute hypoxic respiratory failure  - Secondary to above, resolved   Nausea and vomiting: Due to CHF?  No abdominal pain.  Abdominal exam benign.  Resolved.   NIDDM-2 with hyperglycemia:  -A1c 7.2% in 3/25.  On glipizide  and Tradjenta  at home.   OSA not on CPAP: Not compliant with CPAP at home.  Encouraged compliance   Rheumatoid arthritis - Resume home Arava .   Hypokalemia/hypomagnesemia - Keep K > 4.0 and Mg > 2.0.   Prolonged QT: QTc about 500. - Noted on admission   Elevated troponin/demand ischemia - Manage CHF and hypertension as above - Preserved EF, no wall motion abnormality   Elevated D-dimer: CT angio negative for PE.  LE venous Doppler negative for DVT.   Inguinal lymphadenopathy: Incidental finding: Lower extremity venous Doppler.  Cell counts without significant finding other than mild anemia. -Outpatient follow-up with PCP   Hyperlipidemia - Continue home Lipitor   Leukocytosis: No clear source of infection.  Resolved without antibiotics.  Discharge Exam: Vitals:   07/05/24 0440 07/05/24 0734  BP: (!) 140/62 (!) 156/64  Pulse: 73 66  Resp: 18 20  Temp: 98.1 F (36.7 C) 97.9 F (36.6 C)  SpO2: 95%  97%   Gen: Awake, Alert, Oriented X 3,  HEENT: no JVD Lungs: Good air movement bilaterally, CTAB CVS: S1S2/RRR Abd: soft, Non tender, non distended, BS present Extremities: No edema Skin: no new rashes on exposed skin   Discharge Instructions    Allergies as of 07/05/2024       Reactions   Metformin  And Related Other (See Comments)   Kidney injury, GI upset, diarrhea   Farxiga  [dapagliflozin ] Other (See Comments)   Yeast  infections, recurrent        Medication List     STOP taking these medications    ibuprofen  600 MG tablet Commonly known as: ADVIL        TAKE these medications    aspirin  EC 81 MG tablet Take 81 mg by mouth daily.   atorvastatin  40 MG tablet Commonly known as: LIPITOR Take 1 tablet (40 mg total) by mouth daily.   carvedilol  12.5 MG tablet Commonly known as: COREG  Take 1 tablet (12.5 mg total) by mouth 2 (two) times daily.   Entresto  97-103 MG Generic drug: sacubitril -valsartan  TAKE 1 TABLET BY MOUTH TWICE DAILY   furosemide  40 MG tablet Commonly known as: LASIX  Take 1 tablet (40 mg total) by mouth daily. Take extra dose for weight gain of 3lb in 1dy or 5lb in 1 week What changed: additional instructions   hydrALAZINE  100 MG tablet Commonly known as: APRESOLINE  Take 1 tablet (100 mg total) by mouth 3 (three) times daily.   isosorbide  mononitrate 60 MG 24 hr tablet Commonly known as: IMDUR  Take 1 tablet (60 mg total) by mouth daily.   latanoprost 0.005 % ophthalmic solution Commonly known as: XALATAN Place 1 drop into both eyes at bedtime.   leflunomide  20 MG tablet Commonly known as: ARAVA  Take 1 tablet (20 mg total) by mouth daily.   linagliptin  5 MG Tabs tablet Commonly known as: Tradjenta  Take 1 tablet (5 mg total) by mouth daily.   methocarbamol  500 MG tablet Commonly known as: ROBAXIN  Take 1-2 tablets (500-1,000 mg total) by mouth every 8 (eight) hours as needed for muscle spasms.   spironolactone  50 MG tablet Commonly known as: ALDACTONE  Take 1 tablet (50 mg total) by mouth daily.       Allergies  Allergen Reactions   Metformin  And Related Other (See Comments)    Kidney injury, GI upset, diarrhea   Farxiga  [Dapagliflozin ] Other (See Comments)    Yeast infections, recurrent      The results of significant diagnostics from this hospitalization (including imaging, microbiology, ancillary and laboratory) are listed below for reference.     Significant Diagnostic Studies: ECHOCARDIOGRAM COMPLETE Result Date: 07/04/2024    ECHOCARDIOGRAM REPORT   Patient Name:   Maria Foster Date of Exam: 07/04/2024 Medical Rec #:  [id number]       Height:       63.0 in Accession #:    [id number]      Weight:       159.0 lb Date of Birth:  [birth year]       BSA:          1.754 m Patient Age:    68 years        BP:           144/71 mmHg Patient Gender: F               HR:           74 bpm. Exam Location:  Inpatient Procedure: 2D Echo, 3D Echo,  Color Doppler, Cardiac Doppler and Strain Analysis            (Both Spectral and Color Flow Doppler were utilized during            procedure). Indications:    CHF- Acute Systolic  History:        Patient has prior history of Echocardiogram examinations, most                 recent 10/30/2022. CHF, CAD, Mitral Valve Disease; Risk                 Factors:Hypertension, Diabetes, Sleep Apnea and Current Smoker.  Sonographer:    Logan Shove RDCS Referring Phys: [id number] MIGNON DASEN GONFA IMPRESSIONS  1. Left ventricular ejection fraction, by estimation, is 70 to 75%. The left ventricle has hyperdynamic function. The left ventricle has no regional wall motion abnormalities. Left ventricular diastolic parameters are consistent with Grade I diastolic dysfunction (impaired relaxation). Elevated left atrial pressure.  2. Right ventricular systolic function is normal. The right ventricular size is normal.  3. Left atrial size was mildly dilated.  4. Mild mitral valve regurgitation.  5. The aortic valve is tricuspid. Aortic valve regurgitation is mild.  6. The inferior vena cava is normal in size with greater than 50% respiratory variability, suggesting right atrial pressure of 3 mmHg. FINDINGS  Left Ventricle: Left ventricular ejection fraction, by estimation, is 70 to 75%. The left ventricle has hyperdynamic function. The left ventricle has no regional wall motion abnormalities. The global longitudinal strain is normal despite suboptimal  segment tracking. 3D ejection fraction reviewed and evaluated as part of the interpretation. Alternate measurement of EF is felt to be most reflective of LV function. The left ventricular internal cavity size was normal in size. There is no left ventricular hypertrophy. Left ventricular diastolic parameters are consistent with Grade I diastolic dysfunction (impaired relaxation). Elevated left atrial pressure. Right Ventricle: The right ventricular size is normal. Right vetricular wall thickness was not assessed. Right ventricular systolic function is normal. Left Atrium: Left atrial size was mildly dilated. Right Atrium: Right atrial size was normal in size. Pericardium: There is no evidence of pericardial effusion. Mitral Valve: Mild mitral valve regurgitation. Tricuspid Valve: The tricuspid valve is normal in structure. Tricuspid valve regurgitation is trivial. Aortic Valve: The aortic valve is tricuspid. Aortic valve regurgitation is mild. Aortic valve mean gradient measures 6.0 mmHg. Aortic valve peak gradient measures 10.6 mmHg. Aortic valve area, by VTI measures 2.25 cm. Pulmonic Valve: The pulmonic valve was not well visualized. Pulmonic valve regurgitation is mild. Aorta: The aortic root and ascending aorta are structurally normal, with no evidence of dilitation. Venous: The inferior vena cava is normal in size with greater than 50% respiratory variability, suggesting right atrial pressure of 3 mmHg. IAS/Shunts: No atrial level shunt detected by color flow Doppler. Additional Comments: 3D was performed not requiring image post processing on an independent workstation and was normal.  LEFT VENTRICLE PLAX 2D LVIDd:         4.70 cm   Diastology LVIDs:         2.70 cm   LV e' medial:    4.57 cm/s LV PW:         1.10 cm   LV E/e' medial:  23.4 LV IVS:        1.00 cm   LV e' lateral:   5.44 cm/s LVOT diam:     2.00 cm   LV E/e' lateral: 19.7  LV SV:         75 LV SV Index:   43 LVOT Area:     3.14 cm                            3D Volume EF:                          3D EF:        55 %                          LV EDV:       136 ml                          LV ESV:       61 ml                          LV SV:        75 ml RIGHT VENTRICLE             IVC RV Basal diam:  2.90 cm     IVC diam: 0.80 cm RV S prime:     13.10 cm/s TAPSE (M-mode): 2.3 cm LEFT ATRIUM             Index        RIGHT ATRIUM           Index LA diam:        4.00 cm 2.28 cm/m   RA Area:     15.00 cm LA Vol (A2C):   85.6 ml 48.81 ml/m  RA Volume:   37.60 ml  21.44 ml/m LA Vol (A4C):   60.8 ml 34.67 ml/m LA Biplane Vol: 73.6 ml 41.97 ml/m  AORTIC VALVE AV Area (Vmax):    2.10 cm AV Area (Vmean):   2.07 cm AV Area (VTI):     2.25 cm AV Vmax:           163.00 cm/s AV Vmean:          107.000 cm/s AV VTI:            0.333 m AV Peak Grad:      10.6 mmHg AV Mean Grad:      6.0 mmHg LVOT Vmax:         109.00 cm/s LVOT Vmean:        70.400 cm/s LVOT VTI:          0.239 m LVOT/AV VTI ratio: 0.72  AORTA Ao Root diam: 2.80 cm Ao Asc diam:  3.00 cm MITRAL VALVE MV Area (PHT): 3.72 cm     SHUNTS MV Decel Time: 204 msec     Systemic VTI:  0.24 m MV E velocity: 107.00 cm/s  Systemic Diam: 2.00 cm MV A velocity: 127.00 cm/s MV E/A ratio:  0.84 Vina Gull MD Electronically signed by Vina Gull MD Signature Date/Time: 07/04/2024/1:31:23 PM    Final    VAS US  LOWER EXTREMITY VENOUS (DVT) Result Date: 07/04/2024  Lower Venous DVT Study Patient Name:  LAKODA RASKE  Date of Exam:   07/04/2024 Medical Rec #: [id number]        Accession #:    [id number] Date of Birth: [birth year]        Patient Gender: F Patient Age:  68 years Exam Location:  [facility] Procedure:      VAS US  LOWER EXTREMITY VENOUS (DVT) Referring Phys: TAYE GONFA --------------------------------------------------------------------------------  Indications: Elevated D-dimer (5.68).  Limitations: Poor ultrasound/tissue interface. Comparison Study: No previous exams Performing Technologist: Jody Hill  RVT, RDMS  Examination Guidelines: A complete evaluation includes B-mode imaging, spectral Doppler, color Doppler, and power Doppler as needed of all accessible portions of each vessel. Bilateral testing is considered an integral part of a complete examination. Limited examinations for reoccurring indications may be performed as noted. The reflux portion of the exam is performed with the patient in reverse Trendelenburg.  +---------+---------------+---------+-----------+----------+--------------+ RIGHT    CompressibilityPhasicitySpontaneityPropertiesThrombus Aging +---------+---------------+---------+-----------+----------+--------------+ CFV      Full           Yes      Yes                                 +---------+---------------+---------+-----------+----------+--------------+ SFJ      Full                                                        +---------+---------------+---------+-----------+----------+--------------+ FV Prox  Full           Yes      Yes                                 +---------+---------------+---------+-----------+----------+--------------+ FV Mid   Full           Yes      Yes                                 +---------+---------------+---------+-----------+----------+--------------+ FV DistalFull           Yes      Yes                                 +---------+---------------+---------+-----------+----------+--------------+ PFV      Full                                                        +---------+---------------+---------+-----------+----------+--------------+ POP      Full           Yes      Yes                                 +---------+---------------+---------+-----------+----------+--------------+ PTV      Full                                                        +---------+---------------+---------+-----------+----------+--------------+ PERO     Full                                                         +---------+---------------+---------+-----------+----------+--------------+   +---------+---------------+---------+-----------+----------+--------------+  LEFT     CompressibilityPhasicitySpontaneityPropertiesThrombus Aging +---------+---------------+---------+-----------+----------+--------------+ CFV      Full           Yes      Yes                                 +---------+---------------+---------+-----------+----------+--------------+ SFJ      Full                                                        +---------+---------------+---------+-----------+----------+--------------+ FV Prox  Full           Yes      Yes                                 +---------+---------------+---------+-----------+----------+--------------+ FV Mid   Full           Yes      Yes                                 +---------+---------------+---------+-----------+----------+--------------+ FV DistalFull           Yes      Yes                                 +---------+---------------+---------+-----------+----------+--------------+ PFV      Full                                                        +---------+---------------+---------+-----------+----------+--------------+ POP      Full           Yes      Yes                                 +---------+---------------+---------+-----------+----------+--------------+ PTV      Full                                                        +---------+---------------+---------+-----------+----------+--------------+ PERO     Full                                                        +---------+---------------+---------+-----------+----------+--------------+     Summary: BILATERAL: - No evidence of deep vein thrombosis seen in the lower extremities, bilaterally. -No evidence of popliteal cyst, bilaterally. - Ultrasound characteristics of enlarged lymph nodes are noted in the groin, bilaterally.   *See table(s) above for  measurements and observations. Electronically signed by Penne Colorado MD on 07/04/2024 at 12:13:14 PM.    Final  CT Angio Chest PE W and/or Wo Contrast Result Date: 07/03/2024 CLINICAL DATA:  Positive D-dimer and shortness of breath EXAM: CT ANGIOGRAPHY CHEST WITH CONTRAST TECHNIQUE: Multidetector CT imaging of the chest was performed using the standard protocol during bolus administration of intravenous contrast. Multiplanar CT image reconstructions and MIPs were obtained to evaluate the vascular anatomy. RADIATION DOSE REDUCTION: This exam was performed according to the departmental dose-optimization program which includes automated exposure control, adjustment of the mA and/or kV according to patient size and/or use of iterative reconstruction technique. CONTRAST:  75mL OMNIPAQUE  IOHEXOL  350 MG/ML SOLN COMPARISON:  Chest x-ray from earlier in the same day. FINDINGS: Cardiovascular: Thoracic aorta and its branches are within normal limits. Some atherosclerotic calcifications and soft atherosclerotic plaque is noted. No aneurysmal dilatation or dissection is noted. Heart is at the upper limits of normal in size. The pulmonary artery shows a normal branching pattern bilaterally. No filling defect to suggest pulmonary embolism is noted. Mediastinum/Nodes: Thoracic inlet is within normal limits. No hilar or mediastinal adenopathy is noted. The esophagus as visualized is within normal limits. Lungs/Pleura: Small right pleural effusion is seen. Emphysematous changes are identified. Mild interstitial edema is noted particularly in the right lung similar to that seen on prior plain film examination. Similar changes but to a lesser degree are noted on the left. Upper Abdomen: Visualized upper abdomen shows no acute abnormality. Musculoskeletal: Degenerative changes of the thoracic spine are noted. Review of the MIP images confirms the above findings. IMPRESSION: No evidence of pulmonary emboli. Changes of CHF with  interstitial edema. Aortic Atherosclerosis (ICD10-I70.0) and Emphysema (ICD10-J43.9). Electronically Signed   By: Oneil Devonshire M.D.   On: 07/03/2024 02:32   DG Chest Portable 1 View Result Date: 07/03/2024 CLINICAL DATA:  Shortness of breath. EXAM: PORTABLE CHEST 1 VIEW COMPARISON:  December 19, 2020 FINDINGS: The heart size and mediastinal contours are within normal limits. Mild to moderate severity diffusely increased interstitial lung markings are seen. This is increased in severity when compared to the prior study. Mild atelectatic changes are suspected within the bilateral lung bases. There is a small right pleural effusion. No pneumothorax is identified. The visualized skeletal structures are unremarkable. IMPRESSION: 1. Mild to moderate severity interstitial edema with mild bibasilar atelectasis. 2. Small right pleural effusion. Electronically Signed   By: Suzen Dials M.D.   On: 07/03/2024 01:40    Microbiology: No results found for this or any previous visit (from the past 240 hours).   Labs: Basic Metabolic Panel: Recent Labs  Lab 07/03/24 0118 07/03/24 1447 07/03/24 1730 07/04/24 0245 07/05/24 0253  NA 143 141  --  140 138  K 3.1* 2.9*  --  4.1 3.6  CL 105 102  --  104 102  CO2 23 25  --  26 25  GLUCOSE 198* 165*  --  96 127*  BUN 15 14  --  14 15  CREATININE 1.09* 1.15*  --  1.22* 1.27*  CALCIUM  9.1 8.6*  --  8.4* 8.4*  MG  --   --  1.6* 1.7 2.1   Liver Function Tests: Recent Labs  Lab 07/03/24 0118  AST 15  ALT 6  ALKPHOS 131*  BILITOT 1.0  PROT 7.5  ALBUMIN 4.0   No results for input(s): LIPASE, AMYLASE in the last 168 hours. No results for input(s): AMMONIA in the last 168 hours. CBC: Recent Labs  Lab 07/03/24 0118 07/04/24 0245  WBC 18.4* 9.7  NEUTROABS  --  7.7  HGB  11.3* 10.0*  HCT 35.3* 31.2*  MCV 92.4 91.2  PLT 199 175   Cardiac Enzymes: No results for input(s): CKTOTAL, CKMB, CKMBINDEX, TROPONINI in the last 168  hours. BNP: BNP (last 3 results) No results for input(s): BNP in the last 8760 hours.  ProBNP (last 3 results) Recent Labs    07/03/24 0118  PROBNP 5,847.0*    CBG: Recent Labs  Lab 07/04/24 0540 07/04/24 1122 07/04/24 1641 07/04/24 2127 07/05/24 0618  GLUCAP 128* 154* 169* 169* 115*       Signed:  Sigurd Pac MD.  [facility] 07/05/2024, 10:54 AM

## 2024-07-06 ENCOUNTER — Telehealth: Payer: Self-pay

## 2024-07-06 ENCOUNTER — Other Ambulatory Visit: Payer: Self-pay | Admitting: Medical

## 2024-07-06 NOTE — Transitions of Care (Post Inpatient/ED Visit) (Signed)
 07/06/2024  Name: Maria Foster MRN: [id number] DOB: [birth year]  Today's TOC FU Call Status: Today's TOC FU Call Status:: Successful TOC FU Call Completed TOC FU Call Complete Date: 07/06/24 Patient's Name and Date of Birth confirmed.  Transition Care Management Follow-up Telephone Call Date of Discharge: 07/05/24 Discharge Facility: [facility]) Type of Discharge: Inpatient Admission Primary Inpatient Discharge Diagnosis:: CHF How have you been since you were released from the hospital?: Better Any questions or concerns?: No  Items Reviewed: Did you receive and understand the discharge instructions provided?: Yes Medications obtained,verified, and reconciled?: Yes (Medications Reviewed) Any new allergies since your discharge?: No Do you have support at home?: Yes People in Home [RPT]: spouse Name of Support/Comfort Primary Source: [place]  Medications Reviewed Today: Medications Reviewed Today     Reviewed by Eilleen Richerd GRADE, RN (Registered Nurse) on 07/06/24 at 1213  Med List Status: <None>   Medication Order Taking? Sig Documenting Provider Last Dose Status Informant  aspirin  EC 81 MG tablet [id number] Yes Take 81 mg by mouth daily. [provider]  Active Self, Pharmacy Records  atorvastatin  (LIPITOR) 40 MG tablet [id number] Yes Take 1 tablet (40 mg total) by mouth daily. Vannie Reche RAMAN, NP  Active Self, Pharmacy Records  carvedilol  (COREG ) 12.5 MG tablet [id number] Yes Take 1 tablet (12.5 mg total) by mouth 2 (two) times daily. Vannie Reche RAMAN, NP  Active Self, Pharmacy Records  ENTRESTO  97-103 MG [id number] Yes TAKE 1 TABLET BY MOUTH TWICE DAILY Walker, Caitlin S, NP  Active Self, Pharmacy Records  furosemide  (LASIX ) 40 MG tablet [id number] Yes Take 1 tablet (40 mg total) by mouth daily. Take extra dose for weight gain of 3lb in 1dy or 5lb in 1 week Fairy Frames, MD  Active   hydrALAZINE  (APRESOLINE ) 100 MG tablet [id number] Yes Take 1 tablet (100 mg total)  by mouth 3 (three) times daily. Vannie Reche RAMAN, NP  Active Self, Pharmacy Records  isosorbide  mononitrate (IMDUR ) 60 MG 24 hr tablet [id number] Yes Take 1 tablet (60 mg total) by mouth daily. Vannie Reche RAMAN, NP  Active Self, Pharmacy Records  latanoprost (XALATAN) 0.005 % ophthalmic solution [id number] Yes Place 1 drop into both eyes at bedtime. [provider]  Active Self, Pharmacy Records  leflunomide  (ARAVA ) 20 MG tablet [id number] Yes Take 1 tablet (20 mg total) by mouth daily. Jeannetta Lonni ORN, MD  Active Self, Pharmacy Records  linagliptin  (TRADJENTA ) 5 MG TABS tablet [id number] Yes Take 1 tablet (5 mg total) by mouth daily. Tysinger, Alm RAMAN, PA-C  Active Self, Pharmacy Records  methocarbamol  (ROBAXIN ) 500 MG tablet [id number] Yes Take 1-2 tablets (500-1,000 mg total) by mouth every 8 (eight) hours as needed for muscle spasms. Randol Dawes, MD  Active Self, Pharmacy Records  spironolactone  (ALDACTONE ) 50 MG tablet [id number] Yes Take 1 tablet (50 mg total) by mouth daily. Raford Riggs, MD  Active Self, Pharmacy Records            Home Care and Equipment/Supplies: Were Home Health Services Ordered?: No Any new equipment or medical supplies ordered?: No  Functional Questionnaire: Do you need assistance with bathing/showering or dressing?: No Do you need assistance with meal preparation?: No Do you need assistance with eating?: No Do you have difficulty maintaining continence: No Do you need assistance with getting out of bed/getting out of a chair/moving?: No Do you have difficulty managing or taking your medications?: No  Follow up appointments reviewed: PCP Follow-up appointment confirmed?: No (Arranged by Care  Guide) MD Provider Line Number:[phone] Given: No Specialist Hospital Follow-up appointment confirmed?: Yes Date of Specialist follow-up appointment?: 07/08/24 Follow-Up Specialty Provider:: Cardiology Do you need transportation to your follow-up  appointment?: No Do you understand care options if your condition(s) worsen?: Yes-patient verbalized understanding  SDOH Interventions Today    Flowsheet Row Most Recent Value  SDOH Interventions   Food Insecurity Interventions Intervention Not Indicated  Housing Interventions Intervention Not Indicated  Transportation Interventions Intervention Not Indicated  Utilities Interventions Intervention Not Indicated    Goals Addressed             This Visit's Progress    [facility] Transitions of Care (TOC) Care Plan       Problems:  Recent Hospitalization for treatment of CHF No Hospital Follow Up Provider appointment Care Guides arranged Hospital follow up July 09, 2024 at 8:30 AM  Goal:  Over the next 30 days, the patient will not experience hospital readmission  Interventions:   Heart Failure Interventions: Advised patient to weigh each morning after emptying bladder Discussed importance of daily weight and advised patient to weigh and record daily Reviewed role of diuretics in prevention of fluid overload and management of heart failure; Discussed the importance of keeping all appointments with provider Screening for signs and symptoms of depression related to chronic disease state   Patient Self Care Activities:  Attend all scheduled provider appointments Call pharmacy for medication refills 3-7 days in advance of running out of medications Call provider office for new concerns or questions  Notify RN Care Manager of TOC call rescheduling needs Participate in Transition of Care Program/Attend TOC scheduled calls Perform all self care activities independently  Take medications as prescribed    Plan:  Telephone follow up appointment with care management team member scheduled for:  07/14/24 11:00 am with Bing Edison, RN The care management team will reach out to the patient again over the next 5-10 business days. The patient has been provided with contact information for the  care management team and has been advised to call with any health related questions or concerns.          Richerd Fish, RN, BSN, CCM [facility], [facility] RN Care Manager Direct Dial: [phone]

## 2024-07-06 NOTE — Patient Instructions (Signed)
 Visit Information  Thank you for taking time to visit with me today. Please don't hesitate to contact me if I can be of assistance to you before our next scheduled telephone appointment.  Our next appointment is by telephone on 07/14/24 at 11:00 AM  Following is a copy of your care plan:   Goals Addressed             This Visit's Progress    VBCI Transitions of Care (TOC) Care Plan       Problems:  Recent Hospitalization for treatment of CHF No Hospital Follow Up Provider appointment Care Guides arranged Hospital follow up July 09, 2024 at 8:30 AM  Goal:  Over the next 30 days, the patient will not experience hospital readmission  Interventions:   Heart Failure Interventions: Advised patient to weigh each morning after emptying bladder Discussed importance of daily weight and advised patient to weigh and record daily Reviewed role of diuretics in prevention of fluid overload and management of heart failure; Discussed the importance of keeping all appointments with provider Screening for signs and symptoms of depression related to chronic disease state   Patient Self Care Activities:  Attend all scheduled provider appointments Call pharmacy for medication refills 3-7 days in advance of running out of medications Call provider office for new concerns or questions  Notify RN Care Manager of TOC call rescheduling needs Participate in Transition of Care Program/Attend TOC scheduled calls Perform all self care activities independently  Take medications as prescribed    Plan:  Telephone follow up appointment with care management team member scheduled for:  07/14/24 11:00 am with Bing Edison, RN The care management team will reach out to the patient again over the next 5-10 business days. The patient has been provided with contact information for the care management team and has been advised to call with any health related questions or concerns.         Patient verbalizes  understanding of instructions and care plan provided today and agrees to view in MyChart. Active MyChart status and patient understanding of how to access instructions and care plan via MyChart confirmed with patient.     The patient has been provided with contact information for the care management team and has been advised to call with any health related questions or concerns.  The care management team will reach out to the patient again over the next 5-10 business days.   Please call the care guide team at [phone] if you need to cancel or reschedule your appointment.   Please call the [place]  National Suicide Prevention Lifeline: 514 057 8137 or TTY: (907)664-5079 TTY [phone]) to talk to a trained counselor if you are experiencing a Mental Health or Behavioral Health Crisis or need someone to talk to.  Richerd Fish, RN, BSN, CCM [facility], [facility] RN Care Manager Direct Dial: [phone]

## 2024-07-08 ENCOUNTER — Encounter: Payer: Self-pay | Admitting: Internal Medicine

## 2024-07-08 ENCOUNTER — Ambulatory Visit: Attending: Internal Medicine | Admitting: Internal Medicine

## 2024-07-08 VITALS — BP 155/73 | HR 70 | Resp 18 | Ht 63.0 in | Wt 154.4 lb

## 2024-07-08 DIAGNOSIS — Z79899 Other long term (current) drug therapy: Secondary | ICD-10-CM

## 2024-07-08 DIAGNOSIS — N1831 Chronic kidney disease, stage 3a: Secondary | ICD-10-CM | POA: Diagnosis not present

## 2024-07-08 DIAGNOSIS — M79645 Pain in left finger(s): Secondary | ICD-10-CM | POA: Diagnosis not present

## 2024-07-08 DIAGNOSIS — M05741 Rheumatoid arthritis with rheumatoid factor of right hand without organ or systems involvement: Secondary | ICD-10-CM

## 2024-07-08 DIAGNOSIS — M05742 Rheumatoid arthritis with rheumatoid factor of left hand without organ or systems involvement: Secondary | ICD-10-CM

## 2024-07-08 DIAGNOSIS — M62838 Other muscle spasm: Secondary | ICD-10-CM

## 2024-07-08 DIAGNOSIS — I10 Essential (primary) hypertension: Secondary | ICD-10-CM | POA: Diagnosis not present

## 2024-07-08 MED ORDER — LEFLUNOMIDE 20 MG PO TABS: 20.0000 mg | ORAL_TABLET | Freq: Every day | ORAL | 0 refills | Status: DC

## 2024-07-08 MED ORDER — METHOCARBAMOL 500 MG PO TABS: 500.0000 mg | ORAL_TABLET | Freq: Every evening | ORAL | 0 refills | Status: AC | PRN

## 2024-07-08 NOTE — Patient Instructions (Signed)
 Stretching and range-of-motion exercises These exercises warm up your muscles and joints and improve the movement and flexibility of your back. These exercises also help to relieve pain, numbness, and tingling. Lumbar rotation  Lie on your back on a firm bed or the floor with your knees bent. Straighten your arms out to your sides so each arm forms a 90-degree angle (right angle) with a side of your body. Slowly move (rotate) both of your knees to one side of your body until you feel a stretch in your lower back (lumbar). Try not to let your shoulders lift off the floor. Hold this position for __________ seconds. Tense your abdominal muscles and slowly move your knees back to the starting position. Repeat this exercise on the other side of your body. Repeat __________ times. Complete this exercise __________ times a day. Single knee to chest  Lie on your back on a firm bed or the floor with both legs straight. Bend one of your knees. Use your hands to move your knee up toward your chest until you feel a gentle stretch in your lower back and buttock. Hold your leg in this position by holding on to the front of your knee. Keep your other leg as straight as possible. Hold this position for __________ seconds. Slowly return to the starting position. Repeat with your other leg. Repeat __________ times. Complete this exercise __________ times a day. Prone extension on elbows  Lie on your abdomen on a firm bed or the floor (prone position). Prop yourself up on your elbows. Use your arms to help lift your chest up until you feel a gentle stretch in your abdomen and your lower back. This will place some of your body weight on your elbows. If this is uncomfortable, try stacking pillows under your chest. Your hips should stay down, against the surface that you are lying on. Keep your hip and back muscles relaxed. Hold this position for __________ seconds. Slowly relax your upper body and return to  the starting position. Repeat __________ times. Complete this exercise __________ times a day. Strengthening exercises These exercises build strength and endurance in your back. Endurance is the ability to use your muscles for a long time, even after they get tired. Pelvic tilt This exercise strengthens the muscles that lie deep in the abdomen. Lie on your back on a firm bed or the floor with your legs extended. Bend your knees so they are pointing toward the ceiling and your feet are flat on the floor. Tighten your lower abdominal muscles to press your lower back against the floor. This motion will tilt your pelvis so your tailbone points up toward the ceiling instead of pointing to your feet or the floor. To help with this exercise, you may place a small towel under your lower back and try to push your back into the towel. Hold this position for __________ seconds. Let your muscles relax completely before you repeat this exercise. Repeat __________ times. Complete this exercise __________ times a day.

## 2024-07-09 ENCOUNTER — Encounter: Payer: Self-pay | Admitting: Medical

## 2024-07-09 ENCOUNTER — Ambulatory Visit: Admitting: Medical

## 2024-07-09 VITALS — BP 142/80 | HR 62 | Ht 63.0 in | Wt 160.0 lb

## 2024-07-09 DIAGNOSIS — E1169 Type 2 diabetes mellitus with other specified complication: Secondary | ICD-10-CM | POA: Diagnosis not present

## 2024-07-09 DIAGNOSIS — E1101 Type 2 diabetes mellitus with hyperosmolarity with coma: Secondary | ICD-10-CM

## 2024-07-09 DIAGNOSIS — I509 Heart failure, unspecified: Secondary | ICD-10-CM

## 2024-07-09 DIAGNOSIS — E785 Hyperlipidemia, unspecified: Secondary | ICD-10-CM | POA: Diagnosis not present

## 2024-07-09 DIAGNOSIS — D509 Iron deficiency anemia, unspecified: Secondary | ICD-10-CM

## 2024-07-09 DIAGNOSIS — G4733 Obstructive sleep apnea (adult) (pediatric): Secondary | ICD-10-CM | POA: Diagnosis not present

## 2024-07-09 DIAGNOSIS — I1A0 Resistant hypertension: Secondary | ICD-10-CM

## 2024-07-09 DIAGNOSIS — M05741 Rheumatoid arthritis with rheumatoid factor of right hand without organ or systems involvement: Secondary | ICD-10-CM

## 2024-07-09 DIAGNOSIS — R9431 Abnormal electrocardiogram [ECG] [EKG]: Secondary | ICD-10-CM

## 2024-07-09 DIAGNOSIS — R590 Localized enlarged lymph nodes: Secondary | ICD-10-CM | POA: Diagnosis not present

## 2024-07-09 DIAGNOSIS — E559 Vitamin D deficiency, unspecified: Secondary | ICD-10-CM

## 2024-07-09 DIAGNOSIS — I10 Essential (primary) hypertension: Secondary | ICD-10-CM

## 2024-07-09 DIAGNOSIS — N1831 Chronic kidney disease, stage 3a: Secondary | ICD-10-CM

## 2024-07-09 MED ORDER — FERROUS GLUCONATE 324 (38 FE) MG PO TABS: 324.0000 mg | ORAL_TABLET | Freq: Two times a day (BID) | ORAL | 1 refills | Status: AC

## 2024-07-09 MED ORDER — POTASSIUM CHLORIDE CRYS ER 20 MEQ PO TBCR: 20.0000 meq | EXTENDED_RELEASE_TABLET | Freq: Two times a day (BID) | ORAL | 1 refills | Status: DC

## 2024-07-09 MED ORDER — VITAMIN D (ERGOCALCIFEROL) 1.25 MG (50000 UNIT) PO CAPS: 50000.0000 [IU] | ORAL_CAPSULE | ORAL | 0 refills | Status: DC

## 2024-07-09 MED ORDER — ASPIRIN EC 81 MG PO TBEC
81.0000 mg | DELAYED_RELEASE_TABLET | Freq: Every day | ORAL | 3 refills | Status: AC
Start: 2024-07-09 — End: ?

## 2024-07-09 MED ORDER — LINAGLIPTIN 5 MG PO TABS
5.0000 mg | ORAL_TABLET | Freq: Every day | ORAL | 1 refills | Status: AC
Start: 2024-07-09 — End: ?

## 2024-07-09 MED ORDER — GLIPIZIDE 5 MG PO TABS: 5.0000 mg | ORAL_TABLET | Freq: Two times a day (BID) | ORAL | 2 refills | Status: AC

## 2024-07-09 MED ORDER — CARVEDILOL 12.5 MG PO TABS: 12.5000 mg | ORAL_TABLET | Freq: Two times a day (BID) | ORAL | 1 refills | Status: AC

## 2024-07-09 NOTE — Progress Notes (Signed)
 Subjective: Chief Complaint  Patient presents with   Hospitalization Follow-up    Pt is weak   Here for hospitalization follow up  Admit date: 07/03/2024 Discharge date: 07/05/2024  Discharge Diagnoses:  Principal Problem:   Acute on chronic combined systolic and diastolic CHF (congestive heart failure) (HCC) Uncontrolled hypertension Acute hypoxic respiratory failure Type 2 diabetes mellitus OSA Rheumatoid arthritis Hypokalemia Elevated troponin  Feeling better, but still some weak feeling.  No current symptoms.  No current chest pain, no sob, no edema.  Checking BP at home.   Went to rheumatology yesterday.  BP was slightly elevated there.  At home yesterday 130/80.   Sugar fasting 128 yesterday.  She is out of her vitamin D  and iron .  She is also out of potassium.  No other new complaints.  Per hospital records: History of present illness:  68 year old F with PMH of HFmrEF, NIDDM-2, OSA not compliant with CPAP, HTN, HLD, RA, prolonged QT and CKD-3A presenting with acute orthopnea, and admitted for acute CHF and hypoxic respiratory failure. In ED, initial BP was 120/70.  Then SBP rose to 200s.  She received multiple rounds of IV hydralazine  and a dose of IV Lasix .  Also restarted on home Coreg , Entresto  and Imdur  with improvement in BP.  Saturation was 82% on RA but improved to upper 90s on 4 L.  K3.1.  proBNP 5800.  WBC 18.4.  Hgb 11.3.  D-dimer 5.68.  Troponin 22 and 41.  EKG sinus rhythm with prolonged QT to 500s.  CXR with mild to moderate severity interstitial edema and small right pleural effusion.  CT angio chest negative for PE but concerning for CHF.    Hospital Course:    Acute on chronic HFmrEF:  - Presented with dyspnea and orthopnea presents with orthopnea.   Reports compliance with her meds.  proBNP elevated to 5800.  TTE in 10/2022 with LVEF of 45 to 50%, GH and G1-DD.   -Improved with diuresis, 6 to 7 L negative, switched back to oral Lasix  40 mg  daily --Continue home Coreg , Entresto , Imdur , hydralazine  and Aldactone  -Did not tolerate SGLT2 inhibitors due to recurrent yeast infection in the past - Repeat echo with EF improved to 70-75%, grade 1 DD, normal RV -Recommended close follow-up with cardiology, advised to monitor weight daily and take additional Lasix  dose for weight gain   Uncontrolled hypertension: SBP went from 120s on arrival to 200s while in ED. - With diuresis, meds as above   Acute hypoxic respiratory failure  - Secondary to above, resolved   Nausea and vomiting: Due to CHF?  No abdominal pain.  Abdominal exam benign.  Resolved.   NIDDM-2 with hyperglycemia:  -A1c 7.2% in 3/25.  On glipizide  and Tradjenta  at home.   OSA not on CPAP: Not compliant with CPAP at home.  Encouraged compliance   Rheumatoid arthritis - Resume home Arava .   Hypokalemia/hypomagnesemia - Keep K > 4.0 and Mg > 2.0.   Prolonged QT: QTc about 500. - Noted on admission   Elevated troponin/demand ischemia - Manage CHF and hypertension as above - Preserved EF, no wall motion abnormality   Elevated D-dimer: CT angio negative for PE.  LE venous Doppler negative for DVT.   Inguinal lymphadenopathy: Incidental finding: Lower extremity venous Doppler.  Cell counts without significant finding other than mild anemia. -Outpatient follow-up with PCP   Hyperlipidemia - Continue home Lipitor   Leukocytosis: No clear source of infection.  Resolved without antibiotics.     Past  Medical History:  Diagnosis Date   Arthritis    Cataract    Cataract, nuclear sclerotic, both eyes 11/16/2021   OU progressive, will medically need to have cataract surgery with lens will implantation so as to allow for complete clearance of the vitreous cavity in anticipation of likely vitrectomy in the future for progressive PDR despite previous PRP as delineated by the E DRS series of reports   Diabetes mellitus type 2 with complications (HCC)    diagnosed  probably 2012 per patient   Diabetic retinopathy (HCC)    Hypertension    Rheumatoid arthritis (HCC)    Septic olecranon bursitis of right elbow 07/11/2021   Current Outpatient Medications on File Prior to Visit  Medication Sig Dispense Refill   atorvastatin  (LIPITOR) 40 MG tablet Take 1 tablet (40 mg total) by mouth daily. 90 tablet 3   ENTRESTO  97-103 MG TAKE 1 TABLET BY MOUTH TWICE DAILY 60 tablet 6   furosemide  (LASIX ) 40 MG tablet Take 1 tablet (40 mg total) by mouth daily. Take extra dose for weight gain of 3lb in 1dy or 5lb in 1 week 90 tablet 3   hydrALAZINE  (APRESOLINE ) 100 MG tablet Take 1 tablet (100 mg total) by mouth 3 (three) times daily. 270 tablet 3   isosorbide  mononitrate (IMDUR ) 60 MG 24 hr tablet Take 1 tablet (60 mg total) by mouth daily. 30 tablet 11   latanoprost (XALATAN) 0.005 % ophthalmic solution Place 1 drop into both eyes at bedtime.     leflunomide  (ARAVA ) 20 MG tablet Take 1 tablet (20 mg total) by mouth daily. 90 tablet 0   methocarbamol  (ROBAXIN ) 500 MG tablet Take 1 tablet (500 mg total) by mouth at bedtime as needed for muscle spasms. 30 tablet 0   spironolactone  (ALDACTONE ) 50 MG tablet Take 1 tablet (50 mg total) by mouth daily. 60 tablet 3   No current facility-administered medications on file prior to visit.    ROS as in subjective   Objective: BP (!) 142/80 (BP Location: Right Arm, Patient Position: Sitting)   Pulse 62   Ht 5' 3 (1.6 m)   Wt 160 lb (72.6 kg)   SpO2 96%   BMI 28.34 kg/m   Wt Readings from Last 3 Encounters:  07/09/24 160 lb (72.6 kg)  07/08/24 154 lb 6.4 oz (70 kg)  07/06/24 158 lb (71.7 kg)   BP Readings from Last 3 Encounters:  07/09/24 (!) 142/80  07/08/24 (!) 155/73  07/06/24 128/73   Gen: wd, wn ,nad Lungs clear, no rales, no wheezing Heart regular rate and rhythm, normal S1-S2, no murmur Lower extremity without edema 1+ pedal pulses Abdomen positive bowel sounds, soft, nontender, no organomegaly, no obvious  mass Gyn declined today     Assessment: Encounter Diagnoses  Name Primary?   Congestive heart failure, unspecified HF chronicity, unspecified heart failure type (HCC) Yes   Essential hypertension, benign    Hyperlipidemia associated with type 2 diabetes mellitus (HCC)    Rheumatoid arthritis involving both hands with positive rheumatoid factor (HCC)    OSA (obstructive sleep apnea)    QT prolongation    Stage 3a chronic kidney disease (HCC)    Type 2 diabetes mellitus with hyperosmolar coma, with long-term current use of insulin  (HCC)    Iron  deficiency anemia, unspecified iron  deficiency anemia type    Vitamin D  deficiency    Inguinal lymphadenopathy    Resistant hypertension      Plan: I reviewed her hospitalization discharge summary, imaging  and labs.  Medicines reconciled.  OSA - discussed noncompliance.  She will reach out to her CPAP supplier about trying different mask.   Encouraged to use this daily.  Last sleep study 2022 with severe sleep apnea.  Diabetes-continue glipizide  and Tradjenta , monitor blood sugars fasting morning and bring her blood sugar readings in 10 days when she returns for labs  Begin potassium given history of hypokalemia and Lasix  use  Rheumatoid arthritis-continue Arava  and follow-up with rheumatology specialist  Hypertension, heart failure Continue medications from the hospital Monitor blood pressures and weights at home as discussed Continue carvedilol  12.5 mg twice daily Continue Entresto  97/103 mg twice daily Continue isosorbide  Imdur  60 mg daily Continue spironolactone  50 mg daily Continue Lasix  40 mg once daily or a second dose in the afternoon if worsening swelling or weight gain Continue hydralazine  100 mg 3 times a day  Vitamin D  deficiency-restart vitamin D  supplement  Iron  deficiency anemia-restart iron  ferrous gluconate  and follow-up with gastroenterology soon as planned.  Past due for colonoscopy.  Inguinal lymphadenopathy  every noted on recent ultrasound the hospital-return for Pap smear and follow-up with gastroenterology for colonoscopy  Hyperlipidemia-continue atorvastatin  40 mg daily, aspirin  81 mg daily.    Recommendations: Medications added back today include the following: Potassium twice daily Iron  twice daily called [facility] for ferrous gluconate  Vitamin D  weekly Call your CPAP supplier about trying a different CPAP mask Follow-up with gastroenterology as planned Follow-up with your blood pressure and heart doctors as planned Check your weights daily to make sure you are not gaining 3 to 5 pounds which would suggest heart failure, fluid back up in the heart Return in near future for Pap smear and pelvic exam given lymph nodes swollen on ultrasound in the inguinal area Return in about 10 days for labs to check several things including your A1c, your white counts, your potassium, etc.  Maria Foster was seen today for hospitalization follow-up.  Diagnoses and all orders for this visit:  Congestive heart failure, unspecified HF chronicity, unspecified heart failure type (HCC)  Essential hypertension, benign -     Basic metabolic panel with GFR; Future  Hyperlipidemia associated with type 2 diabetes mellitus (HCC)  Rheumatoid arthritis involving both hands with positive rheumatoid factor (HCC)  OSA (obstructive sleep apnea)  QT prolongation  Stage 3a chronic kidney disease (HCC)  Type 2 diabetes mellitus with hyperosmolar coma, with long-term current use of insulin  (HCC) -     Hemoglobin A1c; Future  Iron  deficiency anemia, unspecified iron  deficiency anemia type -     Iron , TIBC and Ferritin Panel; Future -     CBC; Future  Vitamin D  deficiency -     Vitamin D , 25-hydroxy; Future  Inguinal lymphadenopathy  Resistant hypertension -     carvedilol  (COREG ) 12.5 MG tablet; Take 1 tablet (12.5 mg total) by mouth 2 (two) times daily.  Other orders -     ferrous gluconate  (FERGON) 324 MG  tablet; Take 1 tablet (324 mg total) by mouth 2 (two) times daily with a meal. -     potassium chloride  SA (KLOR-CON  M) 20 MEQ tablet; Take 1 tablet (20 mEq total) by mouth 2 (two) times daily. -     Vitamin D , Ergocalciferol , (DRISDOL ) 1.25 MG (50000 UNIT) CAPS capsule; Take 1 capsule (50,000 Units total) by mouth every 7 (seven) days. -     aspirin  EC 81 MG tablet; Take 1 tablet (81 mg total) by mouth daily. -     linagliptin  (TRADJENTA ) 5  MG TABS tablet; Take 1 tablet (5 mg total) by mouth daily. -     glipiZIDE  (GLUCOTROL ) 5 MG tablet; Take 1 tablet (5 mg total) by mouth 2 (two) times daily before a meal.    F/u  10 days for labs

## 2024-07-09 NOTE — Patient Instructions (Signed)
 Medications added back today include the following: Potassium twice daily Iron  twice daily called [facility] for ferrous gluconate  Vitamin D  weekly Call your CPAP supplier about trying a different CPAP mask Follow-up with gastroenterology as planned Follow-up with your blood pressure and heart doctors as planned Check your weights daily to make sure you are not gaining 3 to 5 pounds which would suggest heart failure, fluid back up in the heart Return in near future for Pap smear and pelvic exam given lymph nodes swollen on ultrasound in the inguinal area Return in about 10 days for labs to check several things including your A1c, your white counts, your potassium, etc.

## 2024-07-14 ENCOUNTER — Other Ambulatory Visit: Payer: Self-pay

## 2024-07-14 NOTE — Transitions of Care (Post Inpatient/ED Visit) (Signed)
 Transition of Care week 2  Visit Note  07/14/2024  Name: Maria Foster MRN: [id number]          DOB: September 10, 1956  Situation: Patient enrolled in [facility] 30-day program. Visit completed with patient by telephone.   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, will medically need to have cataract surgery with lens will implantation so as to allow for complete clearance of the vitreous cavity in anticipation of likely vitrectomy in the future for progressive PDR despite previous PRP as delineated by the E DRS series of reports   Diabetes mellitus type 2 with complications (HCC)    diagnosed probably 2012 per patient   Diabetic retinopathy (HCC)    Hypertension    Rheumatoid arthritis (HCC)    Septic olecranon bursitis of right elbow 07/11/2021    Assessment: Patient Reported Symptoms: Cognitive Cognitive Status: No symptoms reported, Alert and oriented to person, place, and time, Normal speech and language skills      Neurological Neurological Review of Symptoms: No symptoms reported    HEENT HEENT Symptoms Reported: No symptoms reported      Cardiovascular Cardiovascular Symptoms Reported: No symptoms reported Is patient checking Blood Pressure at home?: Yes Patient's Recent BP reading at home: 138/78 Cardiovascular Management Strategies: Activity, Exercise, Coping strategies, Medical device, Medication therapy, Routine screening Weight: 157 lb (71.2 kg) Cardiovascular Self-Management Outcome: 4 (good) Cardiovascular Comment: Taking blood pressure, daily weights, blood sugars daily, is getting a SpO2 finger home monitor from a friend. Denie chest paing or shortness of breath. Reviewed things to call provider for on discharge instructions related to weight gains. Discuss complaince with CPAP and to contact equipment  provider/sleep provider to try nasal pillows instead of mask if appropriate as patient expresses frustration with mask leakage, taking if off during sleep, and overall discomfort of current CPAP maks. Education provided verbally regarding connections between blood pressure, extra workload on heart and vascular issues that CPAP can help or prevent issues. Patient is going to contact provider about nasal pillow potential.  Respiratory Respiratory Symptoms Reported: No symptoms reported Additional Respiratory Details: Denies shortness of breath, daily weights, getting a SpO2 monitor from a CNA friend to start monitoring. NOT on home oxygen. Respiratory Management Strategies: Medication therapy, Routine screening, Activity, Adequate rest, Exercise Respiratory Self-Management Outcome: 4 (good)  Endocrine Endocrine Symptoms Reported: No symptoms reported Is patient diabetic?: Yes Is patient checking blood sugars at home?: Yes List most recent blood sugar readings, include date and time of day: 122 Endocrine Self-Management Outcome: 4 (good)  Gastrointestinal Gastrointestinal Symptoms Reported: No symptoms reported Additional Gastrointestinal Details: PCP to refer to for GI consult, discussed colonoscopy and how much easier they are now as patient had a lot of situational anxiety about any doctor visit or procedure. To discuss possible pre-medication for procedure with PCP on 07/23/24. Gastrointestinal Self-Management Outcome: 4 (good)    Genitourinary Genitourinary Symptoms Reported: No symptoms reported Genitourinary Self-Management Outcome: 4 (good)  Integumentary Integumentary Symptoms Reported: No symptoms reported Skin Self-Management Outcome: 4 (good)  Musculoskeletal Musculoskelatal Symptoms Reviewed: No symptoms reported Musculoskeletal Management Strategies: Adequate rest, Exercise, Coping strategies, Medication therapy, Routine screening Musculoskeletal Self-Management Outcome: 4  (good) Musculoskeletal Comment: Rheumatology follow up completed 07/08/24 Falls in the past year?: No    Psychosocial Psychosocial Symptoms Reported: No symptoms reported Behavioral  Health Self-Management Outcome: 4 (good)   Quality of Family Relationships: involved, supportive Do you feel physically threatened by others?: No   Vitals:   07/14/24 1120  BP: 138/72    Medications Reviewed Today     Reviewed by Carolee Heron NOVAK, RN (Case Manager) on 07/14/24 at 1106  Med List Status: <None>   Medication Order Taking? Sig Documenting Provider Last Dose Status Informant  aspirin  EC 81 MG tablet [id number] Yes Take 1 tablet (81 mg total) by mouth daily. Tysinger, Alm RAMAN, PA-C  Active   atorvastatin  (LIPITOR) 40 MG tablet [id number] Yes Take 1 tablet (40 mg total) by mouth daily. Vannie Reche RAMAN, NP  Active Self, Pharmacy Records  carvedilol  (COREG ) 12.5 MG tablet [id number] Yes Take 1 tablet (12.5 mg total) by mouth 2 (two) times daily. Bulah Alm RAMAN DEVONNA  Active   ENTRESTO  97-103 MG [id number] Yes TAKE 1 TABLET BY MOUTH TWICE DAILY  Patient taking differently: Take 1 tablet by mouth 2 (two) times daily. Patient states taking 3 times a day.   Vannie Reche RAMAN, NP  Active Self, Pharmacy Records  ferrous gluconate  [facility]) 324 MG tablet [id number] Yes Take 1 tablet (324 mg total) by mouth 2 (two) times daily with a meal. Tysinger, Alm RAMAN, PA-C  Active   furosemide  (LASIX ) 40 MG tablet [id number] Yes Take 1 tablet (40 mg total) by mouth daily. Take extra dose for weight gain of 3lb in 1dy or 5lb in 1 week Fairy Frames, MD  Active   glipiZIDE  (GLUCOTROL ) 5 MG tablet [id number] Yes Take 1 tablet (5 mg total) by mouth 2 (two) times daily before a meal. Tysinger, Alm RAMAN, PA-C  Active   hydrALAZINE  (APRESOLINE ) 100 MG tablet [id number] Yes Take 1 tablet (100 mg total) by mouth 3 (three) times daily. Walker, Caitlin S, NP  Active Self, Pharmacy Records  isosorbide  mononitrate (IMDUR ) 60 MG 24 hr  tablet [id number] Yes Take 1 tablet (60 mg total) by mouth daily. Vannie Reche RAMAN, NP  Active Self, Pharmacy Records  latanoprost (XALATAN) 0.005 % ophthalmic solution [id number] Yes Place 1 drop into both eyes at bedtime. [provider]  Active Self, Pharmacy Records  leflunomide  (ARAVA ) 20 MG tablet [id number] Yes Take 1 tablet (20 mg total) by mouth daily. Jeannetta Lonni ORN, MD  Active   linagliptin  (TRADJENTA ) 5 MG TABS tablet [id number] Yes Take 1 tablet (5 mg total) by mouth daily. Tysinger, Alm RAMAN, PA-C  Active   methocarbamol  (ROBAXIN ) 500 MG tablet [id number] Yes Take 1 tablet (500 mg total) by mouth at bedtime as needed for muscle spasms. Jeannetta Lonni ORN, MD  Active   potassium chloride  SA (KLOR-CON  M) 20 MEQ tablet [id number] Yes Take 1 tablet (20 mEq total) by mouth 2 (two) times daily. Tysinger, Alm RAMAN, PA-C  Active   spironolactone  (ALDACTONE ) 50 MG tablet [id number] Yes Take 1 tablet (50 mg total) by mouth daily. Raford Riggs, MD  Active Self, Pharmacy Records  Vitamin D , Ergocalciferol , (DRISDOL ) 1.25 MG (50000 UNIT) CAPS capsule [id number] Yes Take 1 capsule (50,000 Units total) by mouth every 7 (seven) days. Tysinger, Alm RAMAN, PA-C  Active             Goals Addressed             This Visit's Progress    VBCI Transitions of Care (TOC) Care Plan       ALL reviewed/discussed/updated on 07/14/24 during TOC week 2 scheduled call.   Problems:  Recent Hospitalization  for treatment of CHF Knowledge Deficit Related to OSA and CPAP and how related to HTN and HF.   No Specialist appointment: PCP to refer to cardiology and GI consults on next visit on 0/4/25.      Goal:  Over the next 30 days, the patient will not experience hospital readmission  Interventions:   Heart Failure Interventions: Advised patient to weigh each morning after emptying bladder. Reviewed weight gain instructions to call provider for.  Discussed importance of daily weight and advised  patient to weigh and record daily Reviewed role of diuretics in prevention of fluid overload and management of heart failure; Discussed the importance of keeping all appointments with provider. Screening for signs and symptoms of depression related to chronic disease state . Discussed and provided education on OSA and CPAP compliance issues as related to cardiac and vascular issues/risks.  Patient to discuss use of nasal pillows instead of mask with PCP or provider ordering supplies as expressed much frustrations with current mask leakage, taking off during sleep and overall compliance issues.   Also:  Discussed Hgb A1C value of 7/2% taken March 2025, what that measures,  and to be repeated in labwork on 07/23/24.  Discussed/allowed time to express self regarding procedures discussed in care gaps: Colonoscopy.  Reviewed Care Gaps.   Patient Self Care Activities:  Attend all scheduled provider appointments: PCP HFU completed, Rheumatology follow up completed, PCP to arrange referrals to other specialists.  Call pharmacy for medication refills 3-7 days in advance of running out of medications Call provider office for new concerns or questions  Notify RN Care Manager of TOC call rescheduling needs Participate in Transition of Care Program/Attend TOC scheduled calls Perform all self care activities independently  Take medications as prescribed   Continue to daily monitor blood pressure, blood sugar, daily weights.  Contact provider for change to CPAP nasal pillows if appropriate for better compliance.   Plan:  Next PCP appointment scheduled for: 07/22/24 with labwork and consult referrals for Cardiology and GI.  Telephone follow up appointment with care management team member scheduled for:  07/22/24 11:00 am with Bing Edison, RN The patient has been provided with contact information for the care management team and has been advised to call with any health related questions or concerns.          Recommendation:   Continue Current Plan of Care  Follow Up Plan:   Telephone follow-up in 1 week   Bing Edison MSN, RN RN Case Manager [facility]  VBCI-Population Health Office Hours M-F [phone] Direct Dial: [phone] Main Phone [phone]  Fax: [phone] [email or web address]

## 2024-07-14 NOTE — Patient Instructions (Signed)
 Visit Information  Thank you for taking time to visit with me today. Please don't hesitate to contact me if I can be of assistance to you before our next scheduled telephone appointment.  Our next appointment is by telephone on 07/22/24 at 1000  Following is a copy of your care plan:   Goals Addressed             This Visit's Progress    VBCI Transitions of Care (TOC) Care Plan       ALL reviewed/discussed/updated on 07/14/24 during TOC week 2 scheduled call.   Problems:  Recent Hospitalization for treatment of CHF Knowledge Deficit Related to OSA and CPAP and how related to HTN and HF.   No Specialist appointment: PCP to refer to cardiology and GI consults on next visit on 0/4/25.      Goal:  Over the next 30 days, the patient will not experience hospital readmission  Interventions:   Heart Failure Interventions: Advised patient to weigh each morning after emptying bladder. Reviewed weight gain instructions to call provider for.  Discussed importance of daily weight and advised patient to weigh and record daily Reviewed role of diuretics in prevention of fluid overload and management of heart failure; Discussed the importance of keeping all appointments with provider. Screening for signs and symptoms of depression related to chronic disease state . Discussed and provided education on OSA and CPAP compliance issues as related to cardiac and vascular issues/risks.  Patient to discuss use of nasal pillows instead of mask with PCP or provider ordering supplies as expressed much frustrations with current mask leakage, taking off during sleep and overall compliance issues.   Also:  Discussed Hgb A1C value of 7/2% taken March 2025, what that measures,  and to be repeated in labwork on 07/23/24.  Discussed/allowed time to express self regarding procedures discussed in care gaps: Colonoscopy.  Reviewed Care Gaps.   Patient Self Care Activities:  Attend all scheduled provider  appointments: PCP HFU completed, Rheumatology follow up completed, PCP to arrange referrals to other specialists.  Call pharmacy for medication refills 3-7 days in advance of running out of medications Call provider office for new concerns or questions  Notify RN Care Manager of TOC call rescheduling needs Participate in Transition of Care Program/Attend TOC scheduled calls Perform all self care activities independently  Take medications as prescribed   Continue to daily monitor blood pressure, blood sugar, daily weights.  Contact provider for change to CPAP nasal pillows if appropriate for better compliance.   Plan:  Next PCP appointment scheduled for: 07/22/24 with labwork and consult referrals for Cardiology and GI.  Telephone follow up appointment with care management team member scheduled for:  07/22/24 11:00 am with Bing Edison, RN The patient has been provided with contact information for the care management team and has been advised to call with any health related questions or concerns.         The patient verbalized understanding of instructions, educational materials, and care plan provided today and agreed to receive a mailed copy of patient instructions, educational materials, and care plan.   Telephone follow up appointment with care management team member scheduled for: 07/22/24 at 1000 am.  The patient has been provided with contact information for the care management team and has been advised to call with any health related questions or concerns.   Please call the care guide team at [phone] if you need to cancel or reschedule your appointment.   Please call the  Suicide and Crisis Lifeline: 988 call 1-800-273-TALK (toll free, 24 hour hotline) call 911 if you are experiencing a Mental Health or Behavioral Health Crisis or need someone to talk to.   Bing Edison MSN, RN RN Case Sales executive Health  VBCI-Population Health Office Hours M-F [phone] Direct Dial:  [phone] Main Phone [phone]  Fax: [phone] [email or web address]

## 2024-07-19 ENCOUNTER — Other Ambulatory Visit: Payer: Self-pay | Admitting: Medical

## 2024-07-21 ENCOUNTER — Telehealth: Payer: Self-pay

## 2024-07-21 NOTE — Telephone Encounter (Signed)
 This was refill on 07/09/24

## 2024-07-23 ENCOUNTER — Other Ambulatory Visit

## 2024-07-23 DIAGNOSIS — I10 Essential (primary) hypertension: Secondary | ICD-10-CM | POA: Diagnosis not present

## 2024-07-23 DIAGNOSIS — D509 Iron deficiency anemia, unspecified: Secondary | ICD-10-CM

## 2024-07-23 DIAGNOSIS — E559 Vitamin D deficiency, unspecified: Secondary | ICD-10-CM

## 2024-07-23 DIAGNOSIS — Z794 Long term (current) use of insulin: Secondary | ICD-10-CM

## 2024-07-23 DIAGNOSIS — E1101 Type 2 diabetes mellitus with hyperosmolarity with coma: Secondary | ICD-10-CM | POA: Diagnosis not present

## 2024-07-24 ENCOUNTER — Ambulatory Visit: Payer: Self-pay | Admitting: Medical

## 2024-07-24 ENCOUNTER — Telehealth: Payer: Self-pay

## 2024-07-24 ENCOUNTER — Other Ambulatory Visit: Payer: Self-pay

## 2024-07-24 LAB — BASIC METABOLIC PANEL WITH GFR
BUN/Creatinine Ratio: 19 (ref 12–28)
BUN: 25 mg/dL (ref 8–27)
CO2: 23 mmol/L (ref 20–29)
Calcium: 9.1 mg/dL (ref 8.7–10.3)
Chloride: 105 mmol/L (ref 96–106)
Creatinine, Ser: 1.32 mg/dL — AB (ref 0.57–1.00)
Glucose: 131 mg/dL — AB (ref 70–99)
Potassium: 4 mmol/L (ref 3.5–5.2)
Sodium: 140 mmol/L (ref 134–144)
eGFR: 44 mL/min/1.73 — AB (ref >59)

## 2024-07-24 LAB — CBC
Hematocrit: 34.6 % (ref 34.0–46.6)
Hemoglobin: 10.9 g/dL — ABNORMAL LOW (ref 11.1–15.9)
MCH: 29.7 pg (ref 26.6–33.0)
MCHC: 31.5 g/dL (ref 31.5–35.7)
MCV: 94 fL (ref 79–97)
Platelets: 177 x10E3/uL (ref 150–450)
RBC: 3.67 x10E6/uL — ABNORMAL LOW (ref 3.77–5.28)
RDW: 12.6 % (ref 11.7–15.4)
WBC: 6.1 x10E3/uL (ref 3.4–10.8)

## 2024-07-24 LAB — IRON,TIBC AND FERRITIN PANEL
Ferritin: 204 ng/mL — AB (ref 15–150)
Iron Saturation: 25 (ref 15–55)
Iron: 50 ug/dL (ref 27–139)
Total Iron Binding Capacity: 201 ug/dL — AB (ref 250–450)
UIBC: 151 ug/dL (ref 118–369)

## 2024-07-24 LAB — HEMOGLOBIN A1C
Est. average glucose Bld gHb Est-mCnc: 151 mg/dL
Hgb A1c MFr Bld: 6.9 % — ABNORMAL HIGH (ref 4.8–5.6)

## 2024-07-24 LAB — VITAMIN D 25 HYDROXY (VIT D DEFICIENCY, FRACTURES): Vit D, 25-Hydroxy: 20.2 ng/mL — AB (ref 30.0–100.0)

## 2024-07-24 NOTE — Progress Notes (Signed)
 Vit D still low, rest of labs stable  Are you taking the vitamin D  regularly , weekly, prescription 50,000 weekly? If not, do this.  If you are taking it regularly, it isn't helping so we would change to Vitamin D  2k daily.  Continue recent recommendations Potassium twice daily Iron  twice daily called [facility] for ferrous gluconate  Call your CPAP supplier about trying a different CPAP mask Follow-up with gastroenterology as planned Follow-up with your blood pressure and heart doctors as planned Check your weights daily to make sure you are not gaining 3 to 5 pounds which would suggest heart failure, fluid back up in the heart Return in near future for Pap smear and pelvic exam given lymph nodes swollen on ultrasound in the inguinal area

## 2024-07-24 NOTE — Transitions of Care (Post Inpatient/ED Visit) (Signed)
 Transition of Care week 3  Visit Note  07/24/2024  Name: Maria Foster MRN: [id number]          DOB: 04-07-1956  Situation: Patient enrolled in [facility] 30-day program. Visit completed with patient by telephone.   Background:  Problems:  Recent Hospitalization for treatment of CHF Knowledge Deficit Related to OSA and CPAP and how related to HTN and HF.   No Specialist appointment: PCP to refer to cardiology and GI consults on next visit on 07/23/24.   Completed lab work and follow up, PCP to work on GI consult anxiety with patient.   Initial Transition Care Management Follow-up Telephone Call    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   OU progressive, will medically need to have cataract surgery with lens will implantation so as to allow for complete clearance of the vitreous cavity in anticipation of likely vitrectomy in the future for progressive PDR despite previous PRP as delineated by the E DRS series of reports   Diabetes mellitus type 2 with complications (HCC)    diagnosed probably 2012 per patient   Diabetic retinopathy (HCC)    Hypertension    Rheumatoid arthritis (HCC)    Septic olecranon bursitis of right elbow 07/11/2021    Assessment: Patient Reported Symptoms: Cognitive Cognitive Status: No symptoms reported, Alert and oriented to person, place, and time, Insightful and able to interpret abstract concepts, Normal speech and language skills      Neurological Neurological Review of Symptoms: No symptoms reported    HEENT HEENT Symptoms Reported: No symptoms reported      Cardiovascular Cardiovascular Symptoms Reported: No symptoms reported Does patient have uncontrolled Hypertension?: Yes Is patient checking Blood Pressure at home?: Yes Patient's Recent BP reading at home: 133/79 Cardiovascular Management Strategies: Activity, Adequate rest, Coping strategies, Medical device, Medication  therapy, Routine screening Weight: 158 lb (71.7 kg) Cardiovascular Self-Management Outcome: 4 (good)  Respiratory Respiratory Symptoms Reported: No symptoms reported Respiratory Management Strategies: Activity, Coping strategies, CPAP, Medication therapy, Routine screening Respiratory Self-Management Outcome: 4 (good)  Endocrine Endocrine Symptoms Reported: No symptoms reported Is patient diabetic?: Yes Is patient checking blood sugars at home?: Yes List most recent blood sugar readings, include date and time of day: 122 Endocrine Self-Management Outcome: 4 (good)  Gastrointestinal Gastrointestinal Symptoms Reported: No symptoms reported Additional Gastrointestinal Details: Will follow up on GI consult and discussed anxieties with PCP Gastrointestinal Self-Management Outcome: 4 (good)    Genitourinary Genitourinary Symptoms Reported: No symptoms reported    Integumentary Integumentary Symptoms Reported: No symptoms reported    Musculoskeletal Musculoskelatal Symptoms Reviewed: No symptoms reported   Falls in the past year?: No    Psychosocial Psychosocial Symptoms Reported: No symptoms reported         Vitals:   07/24/24 1619  BP: 133/79    Medications Reviewed Today     Reviewed by Carolee Heron NOVAK, RN (Case Manager) on 07/24/24 at 1616  Med List Status: <None>   Medication Order Taking? Sig Documenting Provider Last Dose Status Informant  aspirin  EC 81 MG tablet [id number] Yes Take 1 tablet (81 mg total) by mouth daily. Tysinger, Alm RAMAN, PA-C  Active   atorvastatin  (LIPITOR) 40 MG tablet [id number] Yes Take 1 tablet (40 mg total) by mouth daily. Vannie Reche RAMAN, NP  Active Self, Pharmacy Records  carvedilol  (COREG ) 12.5 MG tablet [id number] Yes Take 1 tablet (12.5 mg total) by mouth  2 (two) times daily. Bulah Alm GORMAN DEVONNA  Active   ENTRESTO  97-103 MG [id number] Yes TAKE 1 TABLET BY MOUTH TWICE DAILY  Patient taking differently: Take 1 tablet by mouth 2 (two) times  daily. Patient states taking 3 times a day.   Vannie Reche GORMAN, NP  Active Self, Pharmacy Records  ferrous gluconate  [facility]) 324 MG tablet [id number] Yes Take 1 tablet (324 mg total) by mouth 2 (two) times daily with a meal. Tysinger, Alm GORMAN, PA-C  Active   furosemide  (LASIX ) 40 MG tablet [id number] Yes Take 1 tablet (40 mg total) by mouth daily. Take extra dose for weight gain of 3lb in 1dy or 5lb in 1 week Fairy Frames, MD  Active   glipiZIDE  (GLUCOTROL ) 5 MG tablet [id number] Yes Take 1 tablet (5 mg total) by mouth 2 (two) times daily before a meal. Tysinger, Alm GORMAN, PA-C  Active   hydrALAZINE  (APRESOLINE ) 100 MG tablet [id number] Yes Take 1 tablet (100 mg total) by mouth 3 (three) times daily. Vannie Reche GORMAN, NP  Active Self, Pharmacy Records  isosorbide  mononitrate (IMDUR ) 60 MG 24 hr tablet [id number] Yes Take 1 tablet (60 mg total) by mouth daily. Vannie Reche GORMAN, NP  Active Self, Pharmacy Records  latanoprost (XALATAN) 0.005 % ophthalmic solution [id number] Yes Place 1 drop into both eyes at bedtime. [provider]  Active Self, Pharmacy Records  leflunomide  (ARAVA ) 20 MG tablet [id number] Yes Take 1 tablet (20 mg total) by mouth daily. Jeannetta Lonni ORN, MD  Active   linagliptin  (TRADJENTA ) 5 MG TABS tablet [id number] Yes Take 1 tablet (5 mg total) by mouth daily. Tysinger, Alm GORMAN, PA-C  Active   methocarbamol  (ROBAXIN ) 500 MG tablet [id number] Yes Take 1 tablet (500 mg total) by mouth at bedtime as needed for muscle spasms. Jeannetta Lonni ORN, MD  Active   potassium chloride  SA (KLOR-CON  M) 20 MEQ tablet [id number] Yes Take 1 tablet (20 mEq total) by mouth 2 (two) times daily. Tysinger, Alm GORMAN, PA-C  Active   spironolactone  (ALDACTONE ) 50 MG tablet [id number] Yes Take 1 tablet (50 mg total) by mouth daily. Raford Riggs, MD  Active Self, Pharmacy Records  Vitamin D , Ergocalciferol , (DRISDOL ) 1.25 MG (50000 UNIT) CAPS capsule [id number] Yes Take 1 capsule (50,000 Units total)  by mouth every 7 (seven) days. Tysinger, Alm GORMAN, PA-C  Active             Goals Addressed             This Visit's Progress    VBCI Transitions of Care (TOC) Care Plan       ALL reviewed/discussed/updated on 07/24/24 during TOC week 3 scheduled call.   Problems:  Recent Hospitalization for treatment of CHF Knowledge Deficit Related to OSA and CPAP and how related to HTN and HF.   No Specialist appointment: PCP to refer to cardiology and GI consults on next visit on 07/23/24.   Completed lab work and follow up, PCP to work on GI consult anxiety with patient.     Goal:  Over the next 30 days, the patient will not experience hospital readmission  Interventions:   Heart Failure Interventions: Advised patient to weigh each morning after emptying bladder. Reviewed weight gain instructions to call provider for.  Discussed importance of daily weight and advised patient to weigh and record daily.  158 lbs today.  Reviewed role of diuretics in prevention of fluid overload and management of heart failure; Discussed the importance of keeping all appointments  with provider. Screening for signs and symptoms of depression related to chronic disease state. Discussed and provided education on OSA and CPAP compliance issues as related to cardiac and vascular issues/risks.  Patient to discuss use of nasal pillows instead of mask with PCP or provider ordering supplies as expressed much frustrations with current mask leakage, taking off during sleep and overall compliance issues.  Patient will seek out alternatives next week.   Also:  Discussed Hgb A1C value of 7/2% taken March 2025,  Hgb A1C on 07/23/24 6.9% down from 7.2% in March 2025.  Discussed/allowed time to express self regarding procedures discussed in care gaps: Colonoscopy.  Reviewed Care Gaps and PCP visit and results.   Patient Self Care Activities:  Attend all scheduled provider appointments: PCP HFU completed, Rheumatology follow  up completed, PCP to arrange referrals to other specialists.  Call pharmacy for medication refills 3-7 days in advance of running out of medications Call provider office for new concerns or questions  Notify RN Care Manager of TOC call rescheduling needs Participate in Transition of Care Program/Attend TOC scheduled calls Perform all self care activities independently  Take medications as prescribed   Continue to daily monitor blood pressure, blood sugar, daily weights.  Contact provider for change to CPAP nasal pillows if appropriate for better compliance.  Patient discussed with PCP and to contact supplier.   Plan:  Telephone follow up appointment with care management team member scheduled for:  07/31/24 3 pm or 11:00 am with Bing Edison, RN The patient has been provided with contact information for the care management team and has been advised to call with any health related questions or concerns.          Recommendation:   Continue Current Plan of Care  Follow Up Plan:   Telephone follow-up in 1 week   Bing Edison MSN, RN RN Case Manager [facility]  VBCI-Population Health Office Hours M-F [phone] Direct Dial: [phone] Main Phone [phone]  Fax: [phone] .com

## 2024-07-24 NOTE — Patient Instructions (Signed)
 Visit Information  Thank you for taking time to visit with me today. Please don't hesitate to contact me if I can be of assistance to you before our next scheduled telephone appointment.  Our next appointment is by telephone on 07/31/24 at 3pm or anytime per patient.   Following is a copy of your care plan:   Goals Addressed             This Visit's Progress    [facility] Transitions of Care (TOC) Care Plan       ALL reviewed/discussed/updated on 07/24/24 during TOC week 3 scheduled call.   Problems:  Recent Hospitalization for treatment of CHF Knowledge Deficit Related to OSA and CPAP and how related to HTN and HF.   No Specialist appointment: PCP to refer to cardiology and GI consults on next visit on 07/23/24.   Completed lab work and follow up, PCP to work on GI consult anxiety with patient.     Goal:  Over the next 30 days, the patient will not experience hospital readmission  Interventions:   Heart Failure Interventions: Advised patient to weigh each morning after emptying bladder. Reviewed weight gain instructions to call provider for.  Discussed importance of daily weight and advised patient to weigh and record daily.  158 lbs today.  Reviewed role of diuretics in prevention of fluid overload and management of heart failure; Discussed the importance of keeping all appointments with provider. Screening for signs and symptoms of depression related to chronic disease state. Discussed and provided education on OSA and CPAP compliance issues as related to cardiac and vascular issues/risks.  Patient to discuss use of nasal pillows instead of mask with PCP or provider ordering supplies as expressed much frustrations with current mask leakage, taking off during sleep and overall compliance issues.  Patient will seek out alternatives next week.   Also:  Discussed Hgb A1C value of 7/2% taken March 2025,  Hgb A1C on 07/23/24 6.9% down from 7.2% in March 2025.  Discussed/allowed time to  express self regarding procedures discussed in care gaps: Colonoscopy.  Reviewed Care Gaps and PCP visit and results.   Patient Self Care Activities:  Attend all scheduled provider appointments: PCP HFU completed, Rheumatology follow up completed, PCP to arrange referrals to other specialists.  Call pharmacy for medication refills 3-7 days in advance of running out of medications Call provider office for new concerns or questions  Notify RN Care Manager of TOC call rescheduling needs Participate in Transition of Care Program/Attend TOC scheduled calls Perform all self care activities independently  Take medications as prescribed   Continue to daily monitor blood pressure, blood sugar, daily weights.  Contact provider for change to CPAP nasal pillows if appropriate for better compliance.  Patient discussed with PCP and to contact supplier.   Plan:  Telephone follow up appointment with care management team member scheduled for:  07/31/24 3 pm or 11:00 am with Bing Edison, RN The patient has been provided with contact information for the care management team and has been advised to call with any health related questions or concerns.         The patient verbalized understanding of instructions, educational materials, and care plan provided today and agreed to receive a mailed copy of patient instructions, educational materials, and care plan.   Want MyChart: Have questions? click here to email the MyChart Help Desk or call [phone]), they will help you set up.   Telephone follow up appointment with care management  team member scheduled for: 07/31/24 at 3 pm. My number is [phone]  Please call the care guide team at [phone] if you need to cancel or reschedule your appointment.   Please call the Suicide and Crisis Lifeline: 988 call 1-800-273-TALK (toll free, 24 hour hotline) call 911 if you are experiencing a Mental Health or Behavioral Health Crisis or need  someone to talk to.   Bing Edison MSN, RN RN Case Sales executive Health  [facility] Office Hours M-F [phone] Direct Dial: [phone] Main Phone [phone]  Fax: [phone] [email or web address]

## 2024-07-27 ENCOUNTER — Other Ambulatory Visit: Payer: Self-pay | Admitting: Medical

## 2024-07-27 MED ORDER — VITAMIN D (ERGOCALCIFEROL) 1.25 MG (50000 UNIT) PO CAPS: 50000.0000 [IU] | ORAL_CAPSULE | ORAL | 2 refills | Status: AC

## 2024-07-31 ENCOUNTER — Other Ambulatory Visit: Payer: Self-pay

## 2024-07-31 ENCOUNTER — Telehealth: Payer: Self-pay

## 2024-07-31 NOTE — Patient Instructions (Signed)
 Visit Information  Thank you for taking time to visit with me today. Please don't hesitate to contact me if I can be of assistance to you before our next scheduled telephone appointment.  Our next appointment is by telephone on 08/07/24 at 1100 am  Following is a copy of your care plan:   Goals Addressed             This Visit's Progress    [facility] Transitions of Care (TOC) Care Plan   On track    ALL reviewed/discussed/updated on 07/31/24 during TOC week 4 scheduled call.   Problems:  Recent Hospitalization for treatment of CHF Knowledge Deficit Related to OSA and CPAP and how related to HTN and HF.   No Specialist appointment: PCP to refer to cardiology and GI consults on next visit on 07/23/24.   Completed lab work and follow up, PCP to work on GI consult anxiety with patient.     Goal:  Over the next 30 days, the patient will not experience hospital readmission  Interventions:   Heart Failure Interventions: Advised patient to weigh each morning after emptying bladder. Reviewed weight gain instructions to call provider for.  Weight today reported as 159.5. Discussed importance of daily weight and advised patient to weigh and record daily.  159.5 lbs today.  Reviewed role of diuretics in prevention of fluid overload and management of heart failure; Discussed the importance of keeping all appointments with provider. Screening for signs and symptoms of depression related to chronic disease state. Discussed and provided education on OSA and CPAP compliance issues as related to cardiac and vascular issues/risks.  Patient to discuss use of nasal pillows instead of mask with PCP or provider ordering supplies as expressed much frustrations with current mask leakage, taking off during sleep and overall compliance issues.  Patient will seek out alternatives next week.  Patient contacted OSA supply company and was told a supply rep. Would reach out and patient was also given the contact  number. Patient will reach out next week if she has not heard from supply representative regarding trying new mask or nasal pillows.   Also:  Discussed Hgb A1C value of 7.2% taken March 2025.  Hgb A1C on 07/23/24 6.9% down from 7.2% in March 2025.  Discussed/allowed time to express self regarding procedures discussed in care gaps: Colonoscopy.  Pending a GI consult and re-visited some of her anxieties about the procdure.  Reviewed Care Gaps and PCP visit and results.  Education: CPAP, A1C (repeat 08/18/24) Yes; Discussed. Education/rationale provided.  Care Gaps: Eye and foot exam 11/24 and 7/25: Yes Flu Vaccine: Due now: Discussed Colonoscopy: Never: Discussed, pt has dr anxiety, walked her through procedure these days.  GI consult pending.  PAP: PCP on this.  Contacting equipment supply company for different mask options.  Dexa Scan ordered on 12/17/23 expires 12/16/24: Reviewed.  Mammogram scheduled 08/10/24.  Patient Self Care Activities:  Attend all scheduled provider appointments: PCP HFU completed,  Rheumatology follow up completed.  PCP to arrange referrals to other specialists.  Call pharmacy for medication refills 3-7 days in advance of running out of medications Call provider office for new concerns or questions  Notify RN Care Manager of TOC call rescheduling needs Participate in Transition of Care Program/Attend TOC scheduled calls Perform all self care activities independently  Take medications as prescribed   Continue to daily monitor blood pressure, blood sugar, daily weights as you have been doing so well.  Contact provider for change to CPAP  nasal pillows if appropriate for better compliance.  Patient discussed with PCP and to contact supplier.   Plan:  Telephone follow up appointment with care management team member scheduled for:  08/07/24 12 pm or 11:00 am with Bing Edison, RN The patient has been provided with contact information for the care management team and has  been advised to call with any health related questions or concerns.         The patient verbalized understanding of instructions, educational materials, and care plan provided today and agreed to receive a mailed copy of patient instructions, educational materials, and care plan.   Telephone follow up appointment with care management team member scheduled for:Our next appointment is by telephone on 08/07/24 at 1100 am  Please call the care guide team at [phone] if you need to cancel or reschedule your appointment.   Please call the Suicide and Crisis Lifeline: 988 call 1-800-273-TALK (toll free, 24 hour hotline) call 911 if you are experiencing a Mental Health or Behavioral Health Crisis or need someone to talk to.   Bing Edison MSN, RN RN Case Sales executive Health  [facility]Population [facility] Office Hours M-F [phone] Direct Dial: [phone] Main Phone [phone]  Fax: [phone] [email or web address]

## 2024-07-31 NOTE — Transitions of Care (Post Inpatient/ED Visit) (Signed)
 Transition of Care week 4  Visit Note  07/31/2024  Name: Maria Foster MRN: [id number]          DOB: 01-18-1956  Situation: Patient enrolled in [facility] 30-day program. Visit completed with patient by telephone.   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, will medically need to have cataract surgery with lens will implantation so as to allow for complete clearance of the vitreous cavity in anticipation of likely vitrectomy in the future for progressive PDR despite previous PRP as delineated by the E DRS series of reports   Diabetes mellitus type 2 with complications (HCC)    diagnosed probably 2012 per patient   Diabetic retinopathy (HCC)    Hypertension    Rheumatoid arthritis (HCC)    Septic olecranon bursitis of right elbow 07/11/2021    Assessment: Patient Reported Symptoms: Cognitive      No symptoms reported  Neurological    No symptoms reported  HEENT    No symptoms reported.     Cardiovascular   Weight: 159 lb 8 oz (72.3 kg)  Respiratory      Endocrine Endocrine Symptoms Reported: No symptoms reported Is patient diabetic?: Yes Is patient checking blood sugars at home?: Yes List most recent blood sugar readings, include date and time of day: 122 (reported was same as last week, double checked with patient).  Endocrine Self-Management Outcome: 4 (good)  Gastrointestinal Gastrointestinal Symptoms Reported: No symptoms reported      Genitourinary Genitourinary Symptoms Reported: No symptoms reported    Integumentary Integumentary Symptoms Reported: No symptoms reported Skin Self-Management Outcome: 4 (good)  Musculoskeletal Musculoskelatal Symptoms Reviewed: No symptoms reported        Psychosocial Psychosocial Symptoms Reported: No symptoms reported         Vitals:   07/31/24 1531   BP: (!) 150/78  Pulse: 70  Weight: 159.5  Medications Reviewed Today     Reviewed by Carolee Heron NOVAK, RN (Case Manager) on 07/31/24 at 1525  Med List Status: <None>   Medication Order Taking? Sig Documenting Provider Last Dose Status Informant  aspirin  EC 81 MG tablet [id number] Yes Take 1 tablet (81 mg total) by mouth daily. Tysinger, Alm RAMAN, PA-C  Active   atorvastatin  (LIPITOR) 40 MG tablet [id number] Yes Take 1 tablet (40 mg total) by mouth daily. Vannie Reche RAMAN, NP  Active Self, Pharmacy Records  carvedilol  (COREG ) 12.5 MG tablet [id number] Yes Take 1 tablet (12.5 mg total) by mouth 2 (two) times daily. Bulah Alm RAMAN DEVONNA  Active   ENTRESTO  97-103 MG [id number] Yes TAKE 1 TABLET BY MOUTH TWICE DAILY Walker, Caitlin S, NP  Active Self, Pharmacy Records  ferrous gluconate  (FERGON) 324 MG tablet [id number] Yes Take 1 tablet (324 mg total) by mouth 2 (two) times daily with a meal. Tysinger, Alm RAMAN, PA-C  Active   furosemide  (LASIX ) 40 MG tablet [id number] Yes Take 1 tablet (40 mg total) by mouth daily. Take extra dose for weight gain of 3lb in 1dy or 5lb in 1 week Fairy Frames, MD  Active   glipiZIDE  (GLUCOTROL ) 5 MG tablet [id number] Yes Take 1 tablet (5 mg total) by mouth 2 (two) times daily before a meal. Tysinger, Alm RAMAN, PA-C  Active   hydrALAZINE  (APRESOLINE ) 100 MG tablet [id number] Yes Take 1 tablet (100 mg  total) by mouth 3 (three) times daily. Vannie Reche RAMAN, NP  Active Self, Pharmacy Records  isosorbide  mononitrate (IMDUR ) 60 MG 24 hr tablet [id number] Yes Take 1 tablet (60 mg total) by mouth daily. Vannie Reche RAMAN, NP  Active Self, Pharmacy Records  latanoprost (XALATAN) 0.005 % ophthalmic solution [id number] Yes Place 1 drop into both eyes at bedtime. [provider]  Active Self, Pharmacy Records  leflunomide  (ARAVA ) 20 MG tablet [id number] Yes Take 1 tablet (20 mg total) by mouth daily. Jeannetta Lonni ORN, MD  Active   linagliptin  (TRADJENTA ) 5 MG TABS tablet  [id number] Yes Take 1 tablet (5 mg total) by mouth daily. Tysinger, Alm RAMAN, PA-C  Active   methocarbamol  (ROBAXIN ) 500 MG tablet [id number] Yes Take 1 tablet (500 mg total) by mouth at bedtime as needed for muscle spasms. Jeannetta Lonni ORN, MD  Active   potassium chloride  SA (KLOR-CON  M) 20 MEQ tablet [id number] Yes Take 1 tablet (20 mEq total) by mouth 2 (two) times daily. Tysinger, Alm RAMAN, PA-C  Active   spironolactone  (ALDACTONE ) 50 MG tablet [id number] Yes Take 1 tablet (50 mg total) by mouth daily. Raford Riggs, MD  Active Self, Pharmacy Records  Vitamin D , Ergocalciferol , (DRISDOL ) 1.25 MG (50000 UNIT) CAPS capsule [id number] Yes Take 1 capsule (50,000 Units total) by mouth every 7 (seven) days. Tysinger, Alm RAMAN, PA-C  Active             Goals Addressed             This Visit's Progress    [facility]BCI Transitions of Care (TOC) Care Plan   On track    ALL reviewed/discussed/updated on 07/31/24 during TOC week 4 scheduled call.   Problems:  Recent Hospitalization for treatment of CHF Knowledge Deficit Related to OSA and CPAP and how related to HTN and HF.   No Specialist appointment: PCP to refer to cardiology and GI consults on next visit on 07/23/24.   Completed lab work and follow up, PCP to work on GI consult anxiety with patient.     Goal:  Over the next 30 days, the patient will not experience hospital readmission  Interventions:   Heart Failure Interventions: Advised patient to weigh each morning after emptying bladder. Reviewed weight gain instructions to call provider for.  Weight today reported as 159.5. Discussed importance of daily weight and advised patient to weigh and record daily.  159.5 lbs today.  Reviewed role of diuretics in prevention of fluid overload and management of heart failure; Discussed the importance of keeping all appointments with provider. Screening for signs and symptoms of depression related to chronic disease state. Discussed and provided  education on OSA and CPAP compliance issues as related to cardiac and vascular issues/risks.  Patient to discuss use of nasal pillows instead of mask with PCP or provider ordering supplies as expressed much frustrations with current mask leakage, taking off during sleep and overall compliance issues.  Patient will seek out alternatives next week.  Patient contacted OSA supply company and was told a supply rep. Would reach out and patient was also given the contact number. Patient will reach out next week if she has not heard from supply representative regarding trying new mask or nasal pillows.   Also:  Discussed Hgb A1C value of 7.2% taken March 2025.  Hgb A1C on 07/23/24 6.9% down from 7.2% in March 2025.  Discussed/allowed time to express self regarding procedures discussed in care gaps: Colonoscopy.  Pending a GI consult and re-visited  some of her anxieties about the procdure.  Reviewed Care Gaps and PCP visit and results.  Education: CPAP, A1C (repeat 08/18/24) Yes; Discussed. Education/rationale provided.  Care Gaps: Eye and foot exam 11/24 and 7/25: Yes Flu Vaccine: Due now: Discussed Colonoscopy: Never: Discussed, pt has dr anxiety, walked her through procedure these days.  GI consult pending.  PAP: PCP on this.  Contacting equipment supply company for different mask options.  Dexa Scan ordered on 12/17/23 expires 12/16/24: Reviewed.  Mammogram scheduled 08/10/24.  Patient Self Care Activities:  Attend all scheduled provider appointments: PCP HFU completed,  Rheumatology follow up completed.  PCP to arrange referrals to other specialists.  Call pharmacy for medication refills 3-7 days in advance of running out of medications Call provider office for new concerns or questions  Notify RN Care Manager of TOC call rescheduling needs Participate in Transition of Care Program/Attend TOC scheduled calls Perform all self care activities independently  Take medications as prescribed    Continue to daily monitor blood pressure, blood sugar, daily weights as you have been doing so well.  Contact provider for change to CPAP nasal pillows if appropriate for better compliance.  Patient discussed with PCP and to contact supplier.   Plan:  Telephone follow up appointment with care management team member scheduled for:  08/07/24 12 pm or 11:00 am with Bing Edison, RN The patient has been provided with contact information for the care management team and has been advised to call with any health related questions or concerns.         Recommendation:   Continue Current Plan of Care    Follow up plan: Next call on 08/07/24 at 1100 am or 12 pm per patient.    Bing Edison MSN, RN RN Case Sales executive Health  [facility]Population [facility] Office Hours M-F [phone] Direct Dial: [phone] Main Phone [phone]  Fax: [phone] [email or web address]

## 2024-08-07 ENCOUNTER — Other Ambulatory Visit: Payer: Self-pay

## 2024-08-07 ENCOUNTER — Telehealth: Payer: Self-pay

## 2024-08-07 NOTE — Transitions of Care (Post Inpatient/ED Visit) (Signed)
 Transition of Care Week 5  Visit Note  08/07/2024  Name: Maria Foster MRN: [id number]          DOB: [birth year]  Situation: Patient enrolled in [facility] 30-day program. Visit completed with Patient by telephone.   Background:  PMH of HFmrEF, NIDDM-2, OSA not compliant with CPAP, HTN, HLD, RA, prolonged QT and CKD-3A presenting with acute orthopnea, and admitted for acute CHF and hypoxic respiratory failure.  Initial Transition Care Management Follow-up Telephone Call    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   OU progressive, will medically need to have cataract surgery with lens will implantation so as to allow for complete clearance of the vitreous cavity in anticipation of likely vitrectomy in the future for progressive PDR despite previous PRP as delineated by the E DRS series of reports   Diabetes mellitus type 2 with complications (HCC)    diagnosed probably 2012 per patient   Diabetic retinopathy (HCC)    Hypertension    Rheumatoid arthritis (HCC)    Septic olecranon bursitis of right elbow 07/11/2021    Assessment: Patient Reported Symptoms: Cognitive Cognitive Status: No symptoms reported, Alert and oriented to person, place, and time, Insightful and able to interpret abstract concepts      Neurological Neurological Review of Symptoms: No symptoms reported    HEENT HEENT Symptoms Reported: No symptoms reported      Cardiovascular Cardiovascular Symptoms Reported: No symptoms reported Is patient checking Blood Pressure at home?: Yes Patient's Recent BP reading at home: 139/76 reported today Cardiovascular Management Strategies: Activity, Coping strategies, Diet modification, Medical device, Medication therapy, Routine screening Weight: 159 lb (72.1 kg) Cardiovascular Self-Management Outcome: 4 (good) Cardiovascular Comment: Reviewed diet, medications, blood pressure, weight.  Respiratory  Respiratory Symptoms Reported: No symptoms reported Additional Respiratory Details: Denies shortness of breath. Respiratory Management Strategies: Activity, Coping strategies, Medication therapy, Routine screening, CPAP Respiratory Self-Management Outcome: 4 (good)  Endocrine Is patient diabetic?: Yes Is patient checking blood sugars at home?: Yes List most recent blood sugar readings, include date and time of day: 123 Endocrine Self-Management Outcome: 4 (good)  Gastrointestinal Additional Gastrointestinal Details: To follow up with PCP on next visit to refer for GI consult for colonoscopy discussed. Gastrointestinal Comment: Patient reports no issues.    Genitourinary Genitourinary Symptoms Reported: No symptoms reported Genitourinary Self-Management Outcome: 4 (good) Genitourinary Comment: Patient reports no issues.  Integumentary Integumentary Symptoms Reported: No symptoms reported Skin Self-Management Outcome: 4 (good)  Musculoskeletal   Musculoskeletal Self-Management Outcome: 4 (good) Falls in the past year?: No    Psychosocial Psychosocial Symptoms Reported: No symptoms reported         Vitals:   08/07/24 1233  BP: 139/76   Blood sugar 123 Weight: 15 lbs.   Medications Reviewed Today     Reviewed by Carolee Heron NOVAK, RN (Case Manager) on 08/07/24 at 1238  Med List Status: <None>   Medication Order Taking? Sig Documenting Provider Last Dose Status Informant  aspirin  EC 81 MG tablet [id number] Yes Take 1 tablet (81 mg total) by mouth daily. Tysinger, Alm RAMAN, PA-C  Active   atorvastatin  (LIPITOR) 40 MG tablet [id number] Yes Take 1 tablet (40 mg total) by mouth daily. Vannie Reche RAMAN, NP  Active Self, Pharmacy Records  carvedilol  (COREG ) 12.5 MG tablet [id number] Yes Take 1 tablet (12.5 mg total) by mouth 2 (two) times daily. Tysinger, Alm RAMAN, PA-C  Active  ENTRESTO  97-103 MG [id number] Yes TAKE 1 TABLET BY MOUTH TWICE DAILY Walker, Caitlin S, NP  Active Self, Pharmacy  Records  ferrous gluconate  [facility]) 324 MG tablet [id number] Yes Take 1 tablet (324 mg total) by mouth 2 (two) times daily with a meal. Tysinger, Alm RAMAN, PA-C  Active   furosemide  (LASIX ) 40 MG tablet [id number] Yes Take 1 tablet (40 mg total) by mouth daily. Take extra dose for weight gain of 3lb in 1dy or 5lb in 1 week Fairy Frames, MD  Active   glipiZIDE  (GLUCOTROL ) 5 MG tablet [id number] Yes Take 1 tablet (5 mg total) by mouth 2 (two) times daily before a meal. Tysinger, Alm RAMAN, PA-C  Active   hydrALAZINE  (APRESOLINE ) 100 MG tablet [id number] Yes Take 1 tablet (100 mg total) by mouth 3 (three) times daily. Walker, Caitlin S, NP  Active Self, Pharmacy Records  isosorbide  mononitrate (IMDUR ) 60 MG 24 hr tablet [id number] Yes Take 1 tablet (60 mg total) by mouth daily. Vannie Reche RAMAN, NP  Active Self, Pharmacy Records  latanoprost (XALATAN) 0.005 % ophthalmic solution [id number] Yes Place 1 drop into both eyes at bedtime. [provider]  Active Self, Pharmacy Records  leflunomide  (ARAVA ) 20 MG tablet [id number] Yes Take 1 tablet (20 mg total) by mouth daily. Jeannetta Lonni ORN, MD  Active   linagliptin  (TRADJENTA ) 5 MG TABS tablet [id number] Yes Take 1 tablet (5 mg total) by mouth daily. Tysinger, Alm RAMAN, PA-C  Active   methocarbamol  (ROBAXIN ) 500 MG tablet [id number] Yes Take 1 tablet (500 mg total) by mouth at bedtime as needed for muscle spasms. Jeannetta Lonni ORN, MD  Active   potassium chloride  SA (KLOR-CON  M) 20 MEQ tablet [id number] Yes Take 1 tablet (20 mEq total) by mouth 2 (two) times daily. Tysinger, Alm RAMAN, PA-C  Active   spironolactone  (ALDACTONE ) 50 MG tablet [id number] Yes Take 1 tablet (50 mg total) by mouth daily. Raford Riggs, MD  Active Self, Pharmacy Records  Vitamin D , Ergocalciferol , (DRISDOL ) 1.25 MG (50000 UNIT) CAPS capsule [id number] Yes Take 1 capsule (50,000 Units total) by mouth every 7 (seven) days. Tysinger, Alm RAMAN, PA-C  Active             Goals  Addressed             This Visit's Progress    [facility] Transitions of Care (TOC) Care Plan   On track      ALL reviewed/discussed/updated on 08/07/24 during TOC week 4 scheduled call.   Problems:  Recent Hospitalization for treatment of CHF Reviewed/updated: Knowledge Deficit Related to OSA and CPAP and how related to HTN and HF.   No Specialist appointment: PCP to refer to cardiology and GI consults on next visit on 07/23/24.   Completed lab work and follow up, PCP to work on GI consult anxiety with patient.     Goal:  Over the next 30 days, the patient will not experience hospital readmission  Interventions:   Heart Failure Interventions: Advised patient to weigh each morning after emptying bladder. Reviewed weight gain instructions to call provider for.  Weight today reported as 159. Discussed importance of daily weight and advised patient to weigh and record daily.  Reviewed role of diuretics in prevention of fluid overload and management of heart failure; Discussed the importance of keeping all appointments with provider. Screening for signs and symptoms of depression related to chronic disease state. Discussed and provided education on OSA and CPAP compliance issues as related to cardiac and  vascular issues/risks.  Patient to discuss use of nasal pillows instead of mask with PCP or provider ordering supplies as expressed much frustrations with current mask leakage, taking off during sleep and overall compliance issues.  Patient will seek out alternatives next week.  Patient contacted OSA supply company and was told a supply rep. would reach out and patient was also given the contact number. Patient will reach out next week if she has not heard from supply representative regarding trying new mask or nasal pillows.  Patient reports she still has not heard back from OSA CPAP supply company and encouraged her to reach out to them so she can get fitted for a better mask for more  compliance with CPAP use after discussing OSA risks.   Also:  Discussed Hgb A1C value of 7.2% taken March 2025.  Hgb A1C on 07/23/24 6.9% down from 7.2% in March 2025.  Discussed/allowed time to express self regarding procedures discussed in care gaps: Colonoscopy.  Pending a GI consult and re-visited some of her anxieties about the procdure.  She feels ready to make this appointment now after a referral is made by PCP.  Reviewed Care Gaps and PCP visit and results.  Education: CPAP, A1C (repeat 08/18/24) Yes; Discussed. Education/rationale provided.  Care Gaps: Eye and foot exam 11/24 and 7/25: Yes Flu Vaccine: Due now: Discussed Colonoscopy: Never: Discussed, pt has dr anxiety, walked her through procedure these days.  GI consult pending.  CPAP: PCP on this.  Contacting equipment supply company for different mask options.  Encouraged patient to keep calling on contact insurance company for assistance.  Dexa Scan ordered on 12/17/23 expires 12/16/24: Reviewed again today. Mammogram scheduled 08/10/24, patient plans on completing this on scheduled date.   Patient Self Care Activities:  Attend all scheduled provider appointments: PCP HFU completed,  Rheumatology follow up completed.  PCP to arrange referrals to other specialists.  Call pharmacy for medication refills 3-7 days in advance of running out of medications Call provider office for new concerns or questions  Notify RN Care Manager of TOC call rescheduling needs Participate in Transition of Care Program/Attend TOC scheduled calls Perform all self care activities independently  Take medications as prescribed.   Continue to daily monitor blood pressure, blood sugar, daily weights as you have been doing so well.  Contact provider for change to CPAP nasal pillows if appropriate for better compliance.  Patient discussed with PCP and to contact supplier.    Plan:  Patient has met all goals of 30 day post discharge program with no new  issues or concerns today.  Informed patient this was last call of the program but has contact information.  The patient has been provided with contact information for the care management team and has been advised to call with any health related questions or concerns.         08/07/24: Patient returned call for TOC week 5 and closure of 30 day program with all goals met. Patient expressed this last hospitalization really was a wake up call for her on the part she needed to play in self care management. She has been faithful and compliant in medications, home monitoring, dietary recommendations, follow up appointments. She still needs to see out a better CPAP mask fit and is more motivated after this was discussed/education provided on OSA and how it related to other chronic conditions.  Patient was informed this was last call in program with no new issues or concerns today. She understand how  to contact benefit program.   Patient also expressed that these follow up calls help motivate her as well to keep on track per patient stated.  Recommendation:   Continue Current Plan of Care  Follow Up Plan:   Patient has met all care management goals. Care Management case will be closed. Patient has been provided contact information should new needs arise.    Bing Edison MSN, RN RN Case Sales executive Health  [facility] Office Hours M-F [phone] Direct Dial: [phone] Main Phone [phone]  Fax: [phone] [email or web address]

## 2024-08-07 NOTE — Patient Instructions (Signed)
 Visit Information  Thank you for taking time to visit with me today. Please don't hesitate to contact me if I can be of assistance to you. Keep up the great self care management you have started!    Following is a copy of your care plan:   Goals Addressed             This Visit's Progress    [facility] Transitions of Care (TOC) Care Plan   On track      ALL reviewed/discussed/updated on 08/07/24 during TOC week 4 scheduled call.   Problems:  Recent Hospitalization for treatment of CHF Reviewed/updated: Knowledge Deficit Related to OSA and CPAP and how related to HTN and HF.   No Specialist appointment: PCP to refer to cardiology and GI consults on next visit on 07/23/24.   Completed lab work and follow up, PCP to work on GI consult anxiety with patient.     Goal:  Over the next 30 days, the patient will not experience hospital readmission  Interventions:   Heart Failure Interventions: Advised patient to weigh each morning after emptying bladder. Reviewed weight gain instructions to call provider for.  Weight today reported as 159. Discussed importance of daily weight and advised patient to weigh and record daily.  Reviewed role of diuretics in prevention of fluid overload and management of heart failure; Discussed the importance of keeping all appointments with provider. Screening for signs and symptoms of depression related to chronic disease state. Discussed and provided education on OSA and CPAP compliance issues as related to cardiac and vascular issues/risks.  Patient to discuss use of nasal pillows instead of mask with PCP or provider ordering supplies as expressed much frustrations with current mask leakage, taking off during sleep and overall compliance issues.  Patient will seek out alternatives next week.  Patient contacted OSA supply company and was told a supply rep. would reach out and patient was also given the contact number. Patient will reach out next week if she  has not heard from supply representative regarding trying new mask or nasal pillows.  Patient reports she still has not heard back from OSA CPAP supply company and encouraged her to reach out to them so she can get fitted for a better mask for more compliance with CPAP use after discussing OSA risks.   Also:  Discussed Hgb A1C value of 7.2% taken March 2025.  Hgb A1C on 07/23/24 6.9% down from 7.2% in March 2025.  Discussed/allowed time to express self regarding procedures discussed in care gaps: Colonoscopy.  Pending a GI consult and re-visited some of her anxieties about the procdure.  She feels ready to make this appointment now after a referral is made by PCP.  Reviewed Care Gaps and PCP visit and results.  Education: CPAP, A1C (repeat 08/18/24) Yes; Discussed. Education/rationale provided.  Care Gaps: Eye and foot exam 11/24 and 7/25: Yes Flu Vaccine: Due now: Discussed Colonoscopy: Never: Discussed, pt has dr anxiety, walked her through procedure these days.  GI consult pending.  CPAP: PCP on this.  Contacting equipment supply company for different mask options.  Encouraged patient to keep calling on contact insurance company for assistance.  Dexa Scan ordered on 12/17/23 expires 12/16/24: Reviewed again today. Mammogram scheduled 08/10/24, patient plans on completing this on scheduled date.   Patient Self Care Activities:  Attend all scheduled provider appointments: PCP HFU completed,  Rheumatology follow up completed.  PCP to arrange referrals to other specialists.  Call pharmacy for medication refills 3-7  days in advance of running out of medications Call provider office for new concerns or questions  Notify RN Care Manager of TOC call rescheduling needs Participate in Transition of Care Program/Attend TOC scheduled calls Perform all self care activities independently  Take medications as prescribed.   Continue to daily monitor blood pressure, blood sugar, daily weights as you have  been doing so well.  Contact provider for change to CPAP nasal pillows if appropriate for better compliance.  Patient discussed with PCP and to contact supplier.    Plan:  Patient has met all goals of 30 day post discharge program with no new issues or concerns today.  Informed patient this was last call of the program but has contact information.  The patient has been provided with contact information for the care management team and has been advised to call with any health related questions or concerns.         The patient verbalized understanding of instructions, educational materials, and care plan provided today and agreed to receive a mailed copy of patient instructions, educational materials, and care plan.   The patient has been provided with contact information for the care management team and has been advised to call with any health related questions or concerns.  No further follow up required: Patient has met all goals of 30 day program and increased her participation and understanding of the importance of self care management activities discussed.   Please call the care guide team at [phone] if you need to cancel or reschedule your appointment.   Please call the Suicide and Crisis Lifeline: 988 call 1-800-273-TALK (toll free, 24 hour hotline) call 911 if you are experiencing a Mental Health or Behavioral Health Crisis or need someone to talk to.   Bing Edison MSN, RN RN Case Sales executive Health  [facility] Office Hours M-F [phone] Direct Dial: [phone] Main Phone [phone]  Fax: [phone] [email or web address]

## 2024-08-10 ENCOUNTER — Ambulatory Visit
Admission: RE | Admit: 2024-08-10 | Discharge: 2024-08-10 | Disposition: A | Discharge status: Home / Self Care | Payer: Medicare Other | Source: Ambulatory Visit | Attending: Medical | Admitting: Medical

## 2024-08-10 ENCOUNTER — Other Ambulatory Visit: Payer: Medicare Other

## 2024-08-10 DIAGNOSIS — Z1231 Encounter for screening mammogram for malignant neoplasm of breast: Secondary | ICD-10-CM | POA: Diagnosis not present

## 2024-08-11 ENCOUNTER — Ambulatory Visit: Payer: Self-pay | Admitting: Medical

## 2024-08-11 NOTE — Progress Notes (Signed)
Mammogram shows area of concern and other imaging recommended.  She should be getting a call back.  If not heard back within 1 week, call us back.

## 2024-08-12 ENCOUNTER — Other Ambulatory Visit: Payer: Self-pay | Admitting: Medical

## 2024-08-12 DIAGNOSIS — R928 Other abnormal and inconclusive findings on diagnostic imaging of breast: Secondary | ICD-10-CM

## 2024-08-20 ENCOUNTER — Encounter

## 2024-08-20 ENCOUNTER — Other Ambulatory Visit

## 2024-08-24 ENCOUNTER — Ambulatory Visit
Admission: RE | Admit: 2024-08-24 | Discharge: 2024-08-24 | Disposition: A | Discharge status: Home / Self Care | Source: Ambulatory Visit | Attending: Medical | Admitting: Medical

## 2024-08-24 ENCOUNTER — Other Ambulatory Visit

## 2024-08-24 ENCOUNTER — Encounter

## 2024-08-24 DIAGNOSIS — R928 Other abnormal and inconclusive findings on diagnostic imaging of breast: Secondary | ICD-10-CM

## 2024-08-24 DIAGNOSIS — N6489 Other specified disorders of breast: Secondary | ICD-10-CM | POA: Diagnosis not present

## 2024-08-25 ENCOUNTER — Ambulatory Visit: Payer: Self-pay | Admitting: Medical

## 2024-08-25 NOTE — Progress Notes (Signed)
 Imaging shows likely benign finding but they do recommend some specific surveillance.  Radiologist recommends for the next 2 years to have screenings that 6 months 12 months and 24 months.  They will contact you about this

## 2024-09-04 ENCOUNTER — Other Ambulatory Visit: Payer: Self-pay | Admitting: Medical

## 2024-09-04 DIAGNOSIS — N63 Unspecified lump in unspecified breast: Secondary | ICD-10-CM

## 2024-09-08 ENCOUNTER — Other Ambulatory Visit

## 2024-09-08 ENCOUNTER — Encounter

## 2024-09-29 NOTE — Progress Notes (Signed)
 Office Visit Note  Patient: Maria Foster             Date of Birth: 12-17-1955           MRN: [id number]             PCP: Bulah Alm RAMAN, PA-C Referring: Bulah Alm RAMAN, PA-C Visit Date: 10/12/2024   Subjective:   Discussed the use of AI scribe software for clinical note transcription with the patient, who gave verbal consent to proceed.  History of Present Illness   Maria Foster is a 68 y.o. female here for follow up for seropositive nodular rheumatoid arthritis on leflunomide  20 mg p.o. daily.   She continues to take leflunomide  daily and reports no adverse effects. She experiences occasional pain in the area where she holds her phone, which was previously injected and sometimes hurts. No additional steroids or injections have been required since her last visit, and she does not experience significant morning stiffness or joint inflammation.  In early October, she was hospitalized for pleural effusion that required drainage. The cause of the fluid buildup remains unclear, and she has not experienced any recent viral illnesses or other symptoms that could explain it.  She has persistent dry and watery eyes since her cataract and laser surgeries. Despite using prescribed eye drops, her symptoms persist, and she has difficulty driving at night with her glasses.  She reports having multiple rheumatoid nodules and does not experience significant joint pain from them.  No recent viral illnesses or nausea. She reports no side effects from her current medication.       Previous HPI 07/08/2024 Maria Foster is a 68 y.o. female here for follow up for seropositive nodular rheumatoid arthritis on leflunomide  20 mg p.o. daily.     She has been experiencing aching pain in her right hip for the past three weeks. The pain is localized to the right hip area and does not radiate. She has been using a heating pad while sitting, which provides some relief. She initially thought the pain  might be due to her mattress, but changing it did not alleviate the symptoms. The pain does not typically bother her while sleeping but may increase with excessive activity.   She is currently taking leflunomide  without any issues. She was previously prescribed methocarbamol , a muscle relaxer, by her primary care physician to be taken as needed, particularly at night. She has not continued it regularly as she was waiting for this appointment to confirm or change plan next step.   She has a history of rheumatoid nodules, which occasionally become sore if bumped, but they do not cause significant pain. She has had surgery in the past for these nodules but is not interested in further surgical intervention.   She has started using an exercise bike at home, which she shares with her husband.   No new or worsening pain in other areas and no significant issues with her current medications.     Previous HPI 03/02/2024 Maria Foster is a 68 y.o. female here for follow up for seropositive nodular rheumatoid arthritis on leflunomide  20 mg p.o. daily.     She experiences a stinging sensation in a red area on her skin, which acts up intermittently and began bothering her last night. She speculates that it might be related to something she ate, specifically mentioning strawberry ice cream, although she is not allergic to strawberries and typically does not consume much sugar.   She  recalls a previous issue with her left index finger, which was getting stuck and causing pain. She received a corticosteroid injection for this problem during her last visit, and since then, she has not experienced any further issues, indicating improvement.   She is currently taking leflunomide  and reports no stomach trouble associated with it. She was recently prescribed carvedilol  for her heart, but it causes her to feel dizzy and lightheaded, leading to a reduction in dosage. Despite the adjustment, she continues to experience  these side effects, describing a sensation of being 'drunk'.   Regarding her joints, she experiences occasional aches, particularly when it rains, but finds relief through exercise. No increased swelling or weakness in her legs. She occasionally takes over-the-counter allergy medications like Zyrtec (cetirizine) 10 mg at night to avoid drowsiness during the day while caring for her four-year-old great-grandchild.      Previous HPI 10/07/2023 Maria Foster is a 68 y.o. female here for follow up for seropositive nodular rheumatoid arthritis on leflunomide  20 mg p.o. daily.  Overall most of her symptoms are doing well and has not noticed either increase nor decrease in the nodules.  Current worst complaint is pain at her left index finger.  This hurts at rest and with use and she notices more frequently if the finger getting stuck and some decrease in ability to close her hand.  Not associated with any change in sensation or loss of grip strength.   Previous HPI 07/09/2023 Maria Foster is a 68 y.o. female here for follow up for seropositive nodular rheumatoid arthritis on leflunomide  20 mg p.o. daily.  She has been doing well without any major flareup without significant joint pain or swelling until the past week.  She did a a lot of cleaning around the house including scrubbing of kitchen back splash and afterwards developed increased pain and swelling in the right wrist.  So far she is not taken anything extra or tried using her wrist band that she sometimes wears in the past for pain relief.   Previous HPI 04/08/2023 Maria Foster is a 68 y.o. female here for follow up for seropositive nodular rheumatoid arthritis on leflunomide  20 mg p.o. daily.  Has not seen much additional change in the hand and elbow nodules.  Not having significant hand pain or stiffness most days.  She noticed some worsening in her knee pain during recent rainy weather but on other days has no symptoms.  She feels she is  noticing some gassiness or bloating with the leflunomide  but not too severe and is still taking the medicine as directed.  She had recent labs checked in April which we reviewed her blood count is normal and metabolic panel shows estimated GFR of 51 which is stable for her baseline.   Previous HPI 12/24/22 Maria Foster is a 68 y.o. female here for follow up for seropositive nodular RA on leflunomide  20 mg p.o. daily.  Unfortunately had interruption with starting due to hospital admission for systemic inflammation starting from urinary tract infection.  Joint pain symptoms have not changed significantly.  She is having issue with ongoing nonproductive cough.  Also with frequent eye watering and some irritation no vision changes and no associated swelling.  She has upcoming evaluations for her lungs and congestive heart failure.   Previous HPI 09/26/22 Maria Foster is a 68 y.o. female here for follow up for seropositive RA we did not start methotrexate as planned due to renal impairment worse than baseline  checked at clinic appointment.  Symptoms remain about the same and also not seeing any worsening with stopping hydroxychloroquine .   Previous HPI 07/02/2022 Maria Foster is a 68 y.o. female here for follow up seropositive RA on hydroxychloroquine  200 mg daily.  She feels like symptoms have made no significant difference on this treatment.  No recurrence of severe flareups like she had in her elbow either.  She does notice a few new and expanding nodules with episodes of spontaneous rupturing and white fluid drainage. Does not have much joint pain or swelling outside of the nodules.   Previous HPI 01/01/2022 Maria Foster is a 68 y.o. female here for follow up for seropositive RA on HCQ 200 mg daily. After our last visit with right elbow injection she developed worsening pain and then purulent drainage found to be MSSA infection of that right olecranon bursa and went to the hospital for surgical  debridement of the wound. Wound culture was obtained also surgical report of rheumatoid nodule at the site. She took a course of antibiotics with PICC line completed without incident. She resumed hydroxychloroquine  after this and she feels her joint pain is very well controlled at this time. She notices some increase in stiffness and pain with cold and changing weather.   Previous HPI 07/03/21 Maria Foster is a 68 y.o. female here for follow up for seropositive RA and chronic tophaceous gout. Recommended starting hydroxychloroquine  treatment 200 mg PO daily for suspected RA as active disease and normal uric levels. She feels her symptoms are improved since starting this, particularly states pain with weather and temperature changes are decreased. She has right elbow pain more than elsewhere says this hurts mostly due to pressure on the site when lying in bed or certain seated positions.   Previous HPI: 03/29/21 Maria Foster is a 68 y.o. female referred here for evaluation of rheumatoid arthritis.  She has a longstanding history of bilateral hand and knee pain that has been going on for years with some progressive worsening and deformity over time.  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  She also has chronic knee pain and stiffness bilaterally without any large nodular changes.  She does not have much joint pain most days however experiences some episodically and also feels that her hands and knees hurt worse with cold and rainy weather.  She describes a history of rheumatoid arthritis but has never been on treatment plans for this.  She states she has never had any episode of gout involving the toes or feet but has been told by her cardiologist this is a problem that she has.   She does not recall ever  undergoing bone density screening for osteoporosis. She has a long smoking history but in recent months is either not smoking or very reduced usage most days.     Review of Systems  Constitutional:  Negative for fatigue.  HENT:  Negative for mouth sores and mouth dryness.   Eyes:  Positive for dryness.  Respiratory:  Negative for shortness of breath.   Cardiovascular:  Negative for chest pain and palpitations.  Gastrointestinal:  Negative for blood in stool, constipation and diarrhea.  Endocrine: Negative for increased urination.  Genitourinary:  Negative for involuntary urination.  Musculoskeletal:  Positive for joint pain and joint  pain. Negative for gait problem, joint swelling, myalgias, muscle weakness, morning stiffness, muscle tenderness and myalgias.  Skin:  Negative for color change, rash, hair loss and sensitivity to sunlight.  Allergic/Immunologic: Positive for susceptible to infections.  Neurological:  Negative for dizziness and headaches.  Hematological:  Negative for swollen glands.  Psychiatric/Behavioral:  Negative for depressed mood and sleep disturbance. The patient is not nervous/anxious.     PMFS History:  Patient Active Problem List   Diagnosis Date Noted   Inguinal lymphadenopathy 07/09/2024   Vitamin D  deficiency 07/09/2024   Iron  deficiency anemia 07/09/2024   Acute on chronic combined systolic and diastolic CHF (congestive heart failure) (HCC) 07/03/2024   Postmenopausal estrogen deficiency 12/16/2023   Former smoker 12/16/2023   White coat syndrome with hypertension 11/09/2022   Cystitis 10/31/2022   Hypertensive urgency 10/30/2022   Hypokalemia 10/30/2022   Hypomagnesemia 10/30/2022   Cough 10/30/2022   Influenza-like illness 10/29/2022   Acute cystitis without hematuria 10/29/2022   Pseudophakia, both eyes 01/29/2022   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 type 2 diabetes mellitus (HCC) 11/16/2021    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 2 diabetes mellitus (HCC) 11/16/2021   OSA (obstructive sleep apnea) 08/25/2021   Iron  deficiency 08/25/2021   Screening for cancer 08/25/2021   Influenza vaccination declined 08/25/2021   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   Stage 3a chronic kidney disease (HCC) 07/11/2021   Essential hypertension, benign 05/04/2021   Hyperlipidemia associated with type 2 diabetes mellitus (HCC) 05/04/2021   Enlarged and hypertrophic nails 05/04/2021   Encounter for screening mammogram for malignant neoplasm of breast 05/04/2021   Need for pneumococcal vaccination 05/04/2021   CHF (congestive heart failure) (HCC) 05/04/2021   Stable angina 05/04/2021   Vaccine counseling 05/04/2021   High risk medication use 03/29/2021   Screen for colon cancer 01/27/2021   Tophi 01/11/2021   Rheumatoid arthritis involving both hands (HCC) 01/11/2021   Elevated troponin    Type 2 diabetes mellitus (HCC) 12/19/2020   Constipation 12/19/2020    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   OU progressive, will medically need to have cataract surgery with lens will implantation so as to allow for complete clearance of the vitreous cavity in anticipation of likely vitrectomy in the future for progressive PDR despite previous PRP as delineated by the E DRS series of reports   Diabetes mellitus type 2 with complications (HCC)    diagnosed probably 2012 per patient   Diabetic retinopathy (HCC)    Hypertension    Rheumatoid arthritis (HCC)    Septic olecranon bursitis of right elbow 07/11/2021    Family History  Problem Relation Age of Onset   Heart attack Mother    Heart failure Mother    Diabetes Mother    Hypertension Mother    Heart failure Sister    Heart Problems Sister    Heart failure Sister    Hypertension Daughter    Amblyopia Neg Hx    Blindness Neg Hx    Cataracts Neg  Hx    Glaucoma Neg Hx    Macular degeneration Neg Hx    Retinal detachment Neg Hx    Strabismus Neg Hx    Retinitis pigmentosa Neg Hx    Past Surgical History:  Procedure Laterality Date   INCISION AND DRAINAGE ABSCESS N/A 07/11/2021   Procedure: INCISION AND DRAINAGE RIGHT  ELBOW;  Surgeon: Ernie Cough, MD;  Location: [facility];  Service: Orthopedics;  Laterality: N/A;   PRP Bilateral OD - 07/23/2017 OS - 08/06/2017   RIGHT/LEFT HEART CATH AND CORONARY ANGIOGRAPHY N/A 01/05/2021   Procedure: RIGHT/LEFT HEART CATH AND CORONARY ANGIOGRAPHY;  Surgeon: Cherrie Toribio SAUNDERS, MD;  Location: [facility];  Service: Cardiovascular;  Laterality: N/A;   TEE WITHOUT CARDIOVERSION N/A 10/09/2021   Procedure: TRANSESOPHAGEAL ECHOCARDIOGRAM (TEE);  Surgeon: Cherrie Toribio SAUNDERS, MD;  Location: [facility];  Service: Cardiovascular;  Laterality: N/A;   Social History   Social History Narrative   Not on file   Immunization History  Administered Date(s) Administered   PFIZER(Purple Top)SARS-COV-2 Vaccination 02/09/2020, 03/01/2020, 11/05/2020   PNEUMOCOCCAL CONJUGATE-20 12/16/2023   Pfizer(Comirnaty)Fall Seasonal Vaccine 12 years and older 12/14/2022   Pneumococcal Polysaccharide-23 05/04/2021   Tdap 07/11/2021     Objective: Vital Signs: BP (!) 165/82   Pulse 70   Temp (!) 97.1 F (36.2 C)   Resp 16   Ht 5' 3 (1.6 m)   Wt 160 lb 12.8 oz (72.9 kg)   BMI 28.48 kg/m    Physical Exam Eyes:     Conjunctiva/sclera: Conjunctivae normal.  Cardiovascular:     Rate and Rhythm: Normal rate and regular rhythm.  Pulmonary:     Effort: Pulmonary effort is normal.     Breath sounds: Normal breath sounds.  Lymphadenopathy:     Cervical: No cervical adenopathy.  Skin:    General: Skin is warm and dry.  Neurological:     Mental Status: She is alert.  Psychiatric:        Mood and Affect: Mood normal.      Musculoskeletal Exam:  Shoulders full ROM no tenderness or swelling Elbows full  ROM no tenderness or swelling, multiple nontender large mobile nodules on olecranon process and on forearm extensor surface Right wrist tenderness and swelling with decreased flexion extension range of motion, left wrist no pain or swelling but with multiple nodules on dorsal side Fingers chronic joint changes with numerous nontender nodules most extensive on MCP and PIP joints, slightly decreased flexion range of motion Right sided low back tenderness to pressure at top edge of iliac crest Knees full ROM no tenderness or swelling  Investigation: No additional findings.  Imaging: No results found.  Recent Labs: Lab Results  Component Value Date   WBC 6.1 07/23/2024   HGB 10.9 (L) 07/23/2024   PLT 177 07/23/2024   NA 140 07/23/2024   K 4.0 07/23/2024   CL 105 07/23/2024   CO2 23 07/23/2024   GLUCOSE 131 (H) 07/23/2024   BUN 25 07/23/2024   CREATININE 1.32 (H) 07/23/2024   BILITOT 1.0 07/03/2024   ALKPHOS 131 (H) 07/03/2024   AST 15 07/03/2024   ALT 6 07/03/2024   PROT 7.5 07/03/2024   ALBUMIN 4.0 07/03/2024   CALCIUM  9.1 07/23/2024   GFRAA >60 11/29/2016    Speciality Comments: No specialty comments available.  Procedures:  No procedures performed Allergies: Metformin  and related, Amoxicillin , and Farxiga  [dapagliflozin ]   Assessment / Plan:     Visit Diagnoses: Rheumatoid arthritis involving both hands with positive rheumatoid factor (HCC) - Plan: leflunomide  (ARAVA ) 20 MG tablet, Sedimentation rate Rheumatoid nodules on the right hand extensor surface without significant joint pain or inflammation. Nodules are not functionally impairing. Previous steroid injection complications noted. Surgical removal not advised due to cosmetic nature and risk of multiple surgeries. Stronger medications could shrink nodules but increase infection risk.  Leflunomide  is well-tolerated. - Checking sed rate for disease activity monitoring - Continue leflunomide  20 mg daily.  Stage 3a  chronic kidney disease (HCC) High risk medication use - leflunomide  20 mg daily. - Plan: CBC with Differential/Platelet, Comprehensive metabolic panel with GFR No serious interval infections. Tolerating mediation well without reported issues. Recent effusions/exacerbation does not appear related. - Checking CBC and CMP for medication monitoring on methotrexate  Pain in left finger, status post minor injury Intermittent pain in left finger post minor knife injury. No swelling or significant inflammation.        Orders: Orders Placed This Encounter  Procedures   CBC with Differential/Platelet   Comprehensive metabolic panel with GFR   Sedimentation rate   Meds ordered this encounter  Medications   leflunomide  (ARAVA ) 20 MG tablet    Sig: Take 1 tablet (20 mg total) by mouth daily.    Dispense:  90 tablet    Refill:  0     Follow-Up Instructions: Return in about 3 months (around 01/12/2025) for RA on LEF f/u 3mos.   Lonni LELON Ester, MD  Note - This record has been created using [employer].  Chart creation errors have been sought, but may not always  have been located. Such creation errors do not reflect on  the standard of medical care.

## 2024-10-11 ENCOUNTER — Other Ambulatory Visit (HOSPITAL_BASED_OUTPATIENT_CLINIC_OR_DEPARTMENT_OTHER): Payer: Self-pay | Admitting: Family

## 2024-10-11 DIAGNOSIS — I1A0 Resistant hypertension: Secondary | ICD-10-CM

## 2024-10-12 ENCOUNTER — Encounter: Payer: Self-pay | Admitting: Internal Medicine

## 2024-10-12 ENCOUNTER — Ambulatory Visit: Attending: Internal Medicine | Admitting: Internal Medicine

## 2024-10-12 VITALS — BP 169/77 | HR 69 | Temp 97.1°F | Resp 16 | Ht 63.0 in | Wt 160.8 lb

## 2024-10-12 DIAGNOSIS — Z79899 Other long term (current) drug therapy: Secondary | ICD-10-CM | POA: Diagnosis not present

## 2024-10-12 DIAGNOSIS — M05741 Rheumatoid arthritis with rheumatoid factor of right hand without organ or systems involvement: Secondary | ICD-10-CM

## 2024-10-12 DIAGNOSIS — I5043 Acute on chronic combined systolic (congestive) and diastolic (congestive) heart failure: Secondary | ICD-10-CM

## 2024-10-12 DIAGNOSIS — N1831 Chronic kidney disease, stage 3a: Secondary | ICD-10-CM | POA: Diagnosis not present

## 2024-10-12 DIAGNOSIS — M62838 Other muscle spasm: Secondary | ICD-10-CM

## 2024-10-12 DIAGNOSIS — M79645 Pain in left finger(s): Secondary | ICD-10-CM

## 2024-10-12 DIAGNOSIS — M05742 Rheumatoid arthritis with rheumatoid factor of left hand without organ or systems involvement: Secondary | ICD-10-CM

## 2024-10-12 MED ORDER — LEFLUNOMIDE 20 MG PO TABS: 20.0000 mg | ORAL_TABLET | Freq: Every day | ORAL | 0 refills | Status: AC

## 2024-10-13 LAB — CBC WITH DIFFERENTIAL/PLATELET
Absolute Lymphocytes: 1372 {cells}/uL (ref 850–3900)
Absolute Monocytes: 438 {cells}/uL (ref 200–950)
Basophils Absolute: 73 {cells}/uL (ref 0–200)
Basophils Relative: 1 %
Eosinophils Absolute: 226 {cells}/uL (ref 15–500)
Eosinophils Relative: 3.1 %
HCT: 40.5 % (ref 35.9–46.0)
Hemoglobin: 12.5 g/dL (ref 11.7–15.5)
MCH: 28.2 pg (ref 27.0–33.0)
MCHC: 30.9 g/dL — ABNORMAL LOW (ref 31.6–35.4)
MCV: 91.4 fL (ref 81.4–101.7)
MPV: 11.4 fL (ref 7.5–12.5)
Monocytes Relative: 6 %
Neutro Abs: 5190 {cells}/uL (ref 1500–7800)
Neutrophils Relative %: 71.1 %
Platelets: 194 Thousand/uL (ref 140–400)
RBC: 4.43 Million/uL (ref 3.80–5.10)
RDW: 11.8 % (ref 11.0–15.0)
Total Lymphocyte: 18.8 %
WBC: 7.3 Thousand/uL (ref 3.8–10.8)

## 2024-10-13 LAB — COMPREHENSIVE METABOLIC PANEL WITH GFR
AG Ratio: 1.3 (calc) (ref 1.0–2.5)
ALT: 10 U/L (ref 6–29)
AST: 13 U/L (ref 10–35)
Albumin: 4.2 g/dL (ref 3.6–5.1)
Alkaline phosphatase (APISO): 111 U/L (ref 37–153)
BUN/Creatinine Ratio: 21 (calc) (ref 6–22)
BUN: 27 mg/dL — ABNORMAL HIGH (ref 7–25)
CO2: 24 mmol/L (ref 20–32)
Calcium: 9.5 mg/dL (ref 8.6–10.4)
Chloride: 107 mmol/L (ref 98–110)
Creat: 1.31 mg/dL — ABNORMAL HIGH (ref 0.50–1.05)
Globulin: 3.3 g/dL (ref 1.9–3.7)
Glucose, Bld: 195 mg/dL — ABNORMAL HIGH (ref 65–99)
Potassium: 4.2 mmol/L (ref 3.5–5.3)
Sodium: 139 mmol/L (ref 135–146)
Total Bilirubin: 0.4 mg/dL (ref 0.2–1.2)
Total Protein: 7.5 g/dL (ref 6.1–8.1)
eGFR: 44 mL/min/1.73m2 — ABNORMAL LOW (ref >=60)

## 2024-10-13 LAB — SEDIMENTATION RATE: Sed Rate: 58 mm/h — ABNORMAL HIGH (ref 0–30)

## 2024-10-16 ENCOUNTER — Other Ambulatory Visit: Payer: Self-pay

## 2024-10-16 ENCOUNTER — Inpatient Hospital Stay (HOSPITAL_BASED_OUTPATIENT_CLINIC_OR_DEPARTMENT_OTHER)
Admission: EM | Admit: 2024-10-16 | Discharge: 2024-10-22 | DRG: 286 | Disposition: A | Attending: Internal Medicine | Admitting: Internal Medicine

## 2024-10-16 ENCOUNTER — Emergency Department (HOSPITAL_BASED_OUTPATIENT_CLINIC_OR_DEPARTMENT_OTHER): Admitting: Radiology

## 2024-10-16 ENCOUNTER — Emergency Department (HOSPITAL_BASED_OUTPATIENT_CLINIC_OR_DEPARTMENT_OTHER)

## 2024-10-16 DIAGNOSIS — J189 Pneumonia, unspecified organism: Secondary | ICD-10-CM

## 2024-10-16 DIAGNOSIS — N3001 Acute cystitis with hematuria: Secondary | ICD-10-CM

## 2024-10-16 DIAGNOSIS — I10 Essential (primary) hypertension: Secondary | ICD-10-CM | POA: Diagnosis present

## 2024-10-16 DIAGNOSIS — R112 Nausea with vomiting, unspecified: Secondary | ICD-10-CM

## 2024-10-16 DIAGNOSIS — R7989 Other specified abnormal findings of blood chemistry: Secondary | ICD-10-CM | POA: Diagnosis present

## 2024-10-16 DIAGNOSIS — I5021 Acute systolic (congestive) heart failure: Secondary | ICD-10-CM | POA: Diagnosis present

## 2024-10-16 DIAGNOSIS — N1831 Chronic kidney disease, stage 3a: Secondary | ICD-10-CM | POA: Diagnosis present

## 2024-10-16 DIAGNOSIS — E8729 Other acidosis: Secondary | ICD-10-CM

## 2024-10-16 DIAGNOSIS — J9600 Acute respiratory failure, unspecified whether with hypoxia or hypercapnia: Secondary | ICD-10-CM

## 2024-10-16 DIAGNOSIS — M069 Rheumatoid arthritis, unspecified: Secondary | ICD-10-CM | POA: Diagnosis present

## 2024-10-16 DIAGNOSIS — I5033 Acute on chronic diastolic (congestive) heart failure: Secondary | ICD-10-CM | POA: Diagnosis present

## 2024-10-16 DIAGNOSIS — E872 Acidosis, unspecified: Secondary | ICD-10-CM

## 2024-10-16 DIAGNOSIS — E119 Type 2 diabetes mellitus without complications: Secondary | ICD-10-CM

## 2024-10-16 DIAGNOSIS — J9601 Acute respiratory failure with hypoxia: Secondary | ICD-10-CM | POA: Diagnosis present

## 2024-10-16 DIAGNOSIS — N179 Acute kidney failure, unspecified: Secondary | ICD-10-CM

## 2024-10-16 DIAGNOSIS — I214 Non-ST elevation (NSTEMI) myocardial infarction: Secondary | ICD-10-CM

## 2024-10-16 DIAGNOSIS — A419 Sepsis, unspecified organism: Principal | ICD-10-CM

## 2024-10-16 LAB — URINALYSIS, ROUTINE W REFLEX MICROSCOPIC
Bilirubin Urine: NEGATIVE
Glucose, UA: 500 mg/dL — AB
Ketones, ur: 15 mg/dL — AB
Nitrite: NEGATIVE
Protein, ur: 30 mg/dL — AB
Specific Gravity, Urine: 1.016 (ref 1.005–1.030)
pH: 5 (ref 5.0–8.0)

## 2024-10-16 LAB — RESP PANEL BY RT-PCR (RSV, FLU A&B, COVID)  RVPGX2
Influenza A by PCR: NEGATIVE
Influenza B by PCR: NEGATIVE
Resp Syncytial Virus by PCR: NEGATIVE
SARS Coronavirus 2 by RT PCR: NEGATIVE

## 2024-10-16 LAB — CBC
HCT: 39.1 % (ref 36.0–46.0)
Hemoglobin: 12.6 g/dL (ref 12.0–15.0)
MCH: 29 pg (ref 26.0–34.0)
MCHC: 32.2 g/dL (ref 30.0–36.0)
MCV: 89.9 fL (ref 80.0–100.0)
Platelets: 186 K/uL (ref 150–400)
RBC: 4.35 MIL/uL (ref 3.87–5.11)
RDW: 13.2 % (ref 11.5–15.5)
WBC: 12.4 K/uL — ABNORMAL HIGH (ref 4.0–10.5)
nRBC: 0 % (ref 0.0–0.2)

## 2024-10-16 LAB — COMPREHENSIVE METABOLIC PANEL WITH GFR
ALT: 9 U/L (ref 0–44)
AST: 17 U/L (ref 15–41)
Albumin: 4.4 g/dL (ref 3.5–5.0)
Alkaline Phosphatase: 142 U/L — ABNORMAL HIGH (ref 38–126)
Anion gap: 17 — ABNORMAL HIGH (ref 5–15)
BUN: 36 mg/dL — ABNORMAL HIGH (ref 8–23)
CO2: 21 mmol/L — ABNORMAL LOW (ref 22–32)
Calcium: 10 mg/dL (ref 8.9–10.3)
Chloride: 102 mmol/L (ref 98–111)
Creatinine, Ser: 1.61 mg/dL — ABNORMAL HIGH (ref 0.44–1.00)
GFR, Estimated: 34 mL/min — ABNORMAL LOW (ref >60)
Glucose, Bld: 263 mg/dL — ABNORMAL HIGH (ref 70–99)
Potassium: 4.2 mmol/L (ref 3.5–5.1)
Sodium: 140 mmol/L (ref 135–145)
Total Bilirubin: 0.6 mg/dL (ref 0.0–1.2)
Total Protein: 8.4 g/dL — ABNORMAL HIGH (ref 6.5–8.1)

## 2024-10-16 LAB — LIPASE, BLOOD: Lipase: 36 U/L (ref 11–51)

## 2024-10-16 MED ORDER — MORPHINE SULFATE (PF) 4 MG/ML IV SOLN
4.0000 mg | Freq: Once | INTRAVENOUS | Status: AC
  Administered 2024-10-16: 4 mg via INTRAVENOUS
  Filled 2024-10-16: qty 1

## 2024-10-16 MED ORDER — ONDANSETRON HCL 4 MG/2ML IJ SOLN
4.0000 mg | Freq: Once | INTRAMUSCULAR | Status: AC
Start: 2024-10-16 — End: 2024-10-16
  Administered 2024-10-16: 4 mg via INTRAVENOUS
  Filled 2024-10-16: qty 2

## 2024-10-16 MED ORDER — LACTATED RINGERS IV BOLUS
500.0000 mL | Freq: Once | INTRAVENOUS | Status: AC
  Administered 2024-10-16: 500 mL via INTRAVENOUS

## 2024-10-16 NOTE — ED Notes (Signed)
 Pt placed on 100% NRB for SATS in the low 80S.

## 2024-10-16 NOTE — ED Provider Notes (Signed)
 [place] EMERGENCY DEPARTMENT AT [facility] Provider Note   CSN: [id number] Arrival date & time: 10/16/24  1850     Patient presents with: Emesis and Weakness   Maria Foster is a 68 y.o. female with past medical history of RA, HLD, HTN, T2DM, CKD stage III, HF, QT prolongation, IDA presents Emergency Department for evaluation of vomiting, general weakness that started at 12 today.  No diarrhea nor abdominal pain.  She states I just do not feel good.  Vomiting started at 1200 today. No diarrhea.  Ate Thanksgiving dinner yesterday and no other family members have any symptoms of vomiting nor diarrhea. Denies chest pain, shortness of breath, cough, congestion, fevers, known sick contacts, recent travel, recent antibiotics, dizziness, visual disturbances  {Add pertinent medical, surgical, social history, OB history to HPI:32947}  Emesis Weakness Associated symptoms: vomiting        Prior to Admission medications   Medication Sig Start Date End Date Taking? Authorizing Provider  aspirin  EC 81 MG tablet Take 1 tablet (81 mg total) by mouth daily. 07/09/24   Tysinger, Alm RAMAN, PA-C  atorvastatin  (LIPITOR) 40 MG tablet Take 1 tablet (40 mg total) by mouth daily. 02/25/24   Vannie Reche RAMAN, NP  carvedilol  (COREG ) 12.5 MG tablet Take 1 tablet (12.5 mg total) by mouth 2 (two) times daily. 07/09/24   Tysinger, Alm RAMAN, PA-C  ENTRESTO  97-103 MG TAKE 1 TABLET BY MOUTH TWICE DAILY 06/04/24   Walker, Caitlin S, NP  ferrous gluconate  (FERGON) 324 MG tablet Take 1 tablet (324 mg total) by mouth 2 (two) times daily with a meal. 07/09/24   Tysinger, Alm RAMAN, PA-C  furosemide  (LASIX ) 40 MG tablet Take 1 tablet (40 mg total) by mouth daily. Take extra dose for weight gain of 3lb in 1dy or 5lb in 1 week 07/05/24   Fairy Frames, MD  glipiZIDE  (GLUCOTROL ) 5 MG tablet Take 1 tablet (5 mg total) by mouth 2 (two) times daily before a meal. 07/09/24   Tysinger, Alm RAMAN, PA-C  hydrALAZINE   (APRESOLINE ) 100 MG tablet Take 1 tablet (100 mg total) by mouth 3 (three) times daily. 02/25/24   Vannie Reche RAMAN, NP  isosorbide  mononitrate (IMDUR ) 60 MG 24 hr tablet Take 1 tablet (60 mg total) by mouth daily. 02/25/24   Walker, Caitlin S, NP  latanoprost (XALATAN) 0.005 % ophthalmic solution Place 1 drop into both eyes at bedtime.    [provider]  leflunomide  (ARAVA ) 20 MG tablet Take 1 tablet (20 mg total) by mouth daily. 10/12/24   Jeannetta Lonni ORN, MD  linagliptin  (TRADJENTA ) 5 MG TABS tablet Take 1 tablet (5 mg total) by mouth daily. 07/09/24   Tysinger, Alm RAMAN, PA-C  methocarbamol  (ROBAXIN ) 500 MG tablet Take 1 tablet (500 mg total) by mouth at bedtime as needed for muscle spasms. 07/08/24   Jeannetta Lonni ORN, MD  potassium chloride  SA (KLOR-CON  M) 20 MEQ tablet Take 1 tablet (20 mEq total) by mouth 2 (two) times daily. 07/09/24   Tysinger, Alm RAMAN, PA-C  spironolactone  (ALDACTONE ) 50 MG tablet Take 1 tablet (50 mg total) by mouth daily. 03/17/24   Raford Riggs, MD  Vitamin D , Ergocalciferol , (DRISDOL ) 1.25 MG (50000 UNIT) CAPS capsule Take 1 capsule (50,000 Units total) by mouth every 7 (seven) days. 07/27/24   Tysinger, Alm RAMAN, PA-C    Allergies: Metformin  and related, Amoxicillin , and Farxiga  [dapagliflozin ]    Review of Systems  Gastrointestinal:  Positive for vomiting.  Neurological:  Positive for  weakness.    Updated Vital Signs BP (!) 172/80   Pulse (!) 103   Temp (!) 97.5 F (36.4 C) (Oral)   Resp (!) 25   SpO2 97%   Physical Exam Vitals and nursing note reviewed.  Constitutional:      General: She is not in acute distress.    Appearance: Normal appearance.     Comments: Restless in bed. Unable to get comfortable  HENT:     Head: Normocephalic and atraumatic.  Eyes:     Conjunctiva/sclera: Conjunctivae normal.  Cardiovascular:     Rate and Rhythm: Tachycardia present.  Pulmonary:     Effort: Pulmonary effort is normal. No respiratory distress.      Breath sounds: Normal breath sounds.  Musculoskeletal:     Right lower leg: No edema.     Left lower leg: No edema.  Skin:    Coloration: Skin is not jaundiced or pale.  Neurological:     Mental Status: She is alert. Mental status is at baseline.     (all labs ordered are listed, but only abnormal results are displayed) Labs Reviewed  COMPREHENSIVE METABOLIC PANEL WITH GFR - Abnormal; Notable for the following components:      Result Value   CO2 21 (*)    Glucose, Bld 263 (*)    BUN 36 (*)    Creatinine, Ser 1.61 (*)    Total Protein 8.4 (*)    Alkaline Phosphatase 142 (*)    GFR, Estimated 34 (*)    Anion gap 17 (*)    All other components within normal limits  CBC - Abnormal; Notable for the following components:   WBC 12.4 (*)    All other components within normal limits  URINALYSIS, ROUTINE W REFLEX MICROSCOPIC - Abnormal; Notable for the following components:   APPearance HAZY (*)    Glucose, UA 500 (*)    Hgb urine dipstick TRACE (*)    Ketones, ur 15 (*)    Protein, ur 30 (*)    Leukocytes,Ua MODERATE (*)    Bacteria, UA MANY (*)    All other components within normal limits  RESP PANEL BY RT-PCR (RSV, FLU A&B, COVID)  RVPGX2  LIPASE, BLOOD    EKG: None  Radiology: No results found.  {Document cardiac monitor, telemetry assessment procedure when appropriate:32947} Procedures   Medications Ordered in the ED - No data to display    {Click here for ABCD2, HEART and other calculators REFRESH Note before signing:1}                              Medical Decision Making Amount and/or Complexity of Data Reviewed Labs: ordered. Radiology: ordered.  Risk Prescription drug management.     Patient presents to the ED for concern of ***, this involves an extensive number of treatment options, and is a complaint that carries with it a high risk of complications and morbidity.  The differential diagnosis includes ***   Co morbidities that complicate the  patient evaluation  ***   Additional history obtained:  Additional history obtained from *** {Blank multiple:19196::EMS,Family,Nursing,Outside Medical Records,Past Admission}   External records from outside source obtained and reviewed including ***   Lab Tests:  I Ordered, and personally interpreted labs.  The pertinent results include:  ***   Imaging Studies ordered:  I ordered imaging studies including ***  I independently visualized and interpreted imaging which showed *** I agree with  the radiologist interpretation   Cardiac Monitoring:  The patient was maintained on a cardiac monitor.  I personally viewed and interpreted the cardiac monitored which showed an underlying rhythm of: ***   Medicines ordered and prescription drug management:  I ordered medication including ***  for ***  Reevaluation of the patient after these medicines showed that the patient {resolved/improved/worsened:23923::improved} I have reviewed the patients home medicines and have made adjustments as needed   Test Considered:  ***   Critical Interventions:  ***   Consultations Obtained:  I requested consultation with the ***,  and discussed lab and imaging findings as well as pertinent plan - they recommend: ***   Problem List / ED Course:  Vomiting Tachycardia. Mild leukocytosis 12.4. will obtain lactic. No fever reported nor in ED but concern for sepsis with sx, tachycardia, leukocytosis. Considering CT for etiology of infection if troponin negative Otherwise labs unremarkable. No elevated lipase. Resp panel negative. UA contaminated but not grossly infected No complaints of chest pain but with atypical symptoms in women as well as vomiting, will obtain cardiac workup to r/o ACS as etiology No complaints of visual disturbances, headache, nor dizziness. Low suspicion for vertigo, CVA/TIA    Reevaluation:  After the interventions noted above, I reevaluated the  patient and found that they have :{resolved/improved/worsened:23923::improved}   Social Determinants of Health:  ***   Dispostion:  After consideration of the diagnostic results and the patients response to treatment, I feel that the patent would benefit from ***.    {Document critical care time when appropriate  Document review of labs and clinical decision tools ie CHADS2VASC2, etc  Document your independent review of radiology images and any outside records  Document your discussion with family members, caretakers and with consultants  Document social determinants of health affecting pt's care  Document your decision making why or why not admission, treatments were needed:32947:::1}   Final diagnoses:  None    ED Discharge Orders     None

## 2024-10-16 NOTE — ED Triage Notes (Signed)
 Pt reports N/V since this morning, ongoing. Cannot tolerate PO intake. 'Just don't feel good'. Generalized weakness and body aches. Denies sick contacts. Denies abd pain.

## 2024-10-17 ENCOUNTER — Encounter (HOSPITAL_COMMUNITY): Payer: Self-pay | Admitting: Family Medicine

## 2024-10-17 ENCOUNTER — Emergency Department (HOSPITAL_BASED_OUTPATIENT_CLINIC_OR_DEPARTMENT_OTHER)

## 2024-10-17 ENCOUNTER — Inpatient Hospital Stay (HOSPITAL_COMMUNITY)

## 2024-10-17 DIAGNOSIS — I5033 Acute on chronic diastolic (congestive) heart failure: Secondary | ICD-10-CM

## 2024-10-17 DIAGNOSIS — J9601 Acute respiratory failure with hypoxia: Secondary | ICD-10-CM | POA: Diagnosis not present

## 2024-10-17 DIAGNOSIS — I10 Essential (primary) hypertension: Secondary | ICD-10-CM

## 2024-10-17 DIAGNOSIS — Z794 Long term (current) use of insulin: Secondary | ICD-10-CM

## 2024-10-17 DIAGNOSIS — R7989 Other specified abnormal findings of blood chemistry: Secondary | ICD-10-CM

## 2024-10-17 DIAGNOSIS — N1831 Chronic kidney disease, stage 3a: Secondary | ICD-10-CM | POA: Diagnosis not present

## 2024-10-17 DIAGNOSIS — I5031 Acute diastolic (congestive) heart failure: Secondary | ICD-10-CM | POA: Diagnosis not present

## 2024-10-17 DIAGNOSIS — I214 Non-ST elevation (NSTEMI) myocardial infarction: Secondary | ICD-10-CM

## 2024-10-17 DIAGNOSIS — E1122 Type 2 diabetes mellitus with diabetic chronic kidney disease: Secondary | ICD-10-CM | POA: Diagnosis not present

## 2024-10-17 LAB — TROPONIN T, HIGH SENSITIVITY
Troponin T High Sensitivity: 175 ng/L (ref 0–19)
Troponin T High Sensitivity: 225 ng/L (ref 0–19)
Troponin T High Sensitivity: 274 ng/L (ref 0–19)

## 2024-10-17 LAB — MAGNESIUM: Magnesium: 2 mg/dL (ref 1.7–2.4)

## 2024-10-17 LAB — I-STAT VENOUS BLOOD GAS, ED
Acid-base deficit: 3 mmol/L — ABNORMAL HIGH (ref 0.0–2.0)
Bicarbonate: 24.3 mmol/L (ref 20.0–28.0)
Calcium, Ion: 1.16 mmol/L (ref 1.15–1.40)
HCT: 39 % (ref 36.0–46.0)
Hemoglobin: 13.3 g/dL (ref 12.0–15.0)
O2 Saturation: 50 %
Patient temperature: 97.8
Potassium: 4.1 mmol/L (ref 3.5–5.1)
Sodium: 140 mmol/L (ref 135–145)
TCO2: 26 mmol/L (ref 22–32)
pCO2, Ven: 52.4 mmHg (ref 44–60)
pH, Ven: 7.272 (ref 7.25–7.43)
pO2, Ven: 30 mmHg — CL (ref 32–45)

## 2024-10-17 LAB — GLUCOSE, CAPILLARY
Glucose-Capillary: 243 mg/dL — ABNORMAL HIGH (ref 70–99)
Glucose-Capillary: 203 mg/dL — ABNORMAL HIGH (ref 70–99)
Glucose-Capillary: 155 mg/dL — ABNORMAL HIGH (ref 70–99)
Glucose-Capillary: 220 mg/dL — ABNORMAL HIGH (ref 70–99)
Glucose-Capillary: 183 mg/dL — ABNORMAL HIGH (ref 70–99)

## 2024-10-17 LAB — TROPONIN I (HIGH SENSITIVITY)
Troponin I (High Sensitivity): 676 ng/L (ref <18)
Troponin I (High Sensitivity): 703 ng/L (ref <18)

## 2024-10-17 LAB — BASIC METABOLIC PANEL WITH GFR
Anion gap: 13 (ref 5–15)
BUN: 36 mg/dL — ABNORMAL HIGH (ref 8–23)
CO2: 22 mmol/L (ref 22–32)
Calcium: 9.4 mg/dL (ref 8.9–10.3)
Chloride: 101 mmol/L (ref 98–111)
Creatinine, Ser: 1.39 mg/dL — ABNORMAL HIGH (ref 0.44–1.00)
GFR, Estimated: 41 mL/min — ABNORMAL LOW
Glucose, Bld: 287 mg/dL — ABNORMAL HIGH (ref 70–99)
Potassium: 4.3 mmol/L (ref 3.5–5.1)
Sodium: 136 mmol/L (ref 135–145)

## 2024-10-17 LAB — LACTIC ACID, PLASMA
Lactic Acid, Venous: 1.8 mmol/L (ref 0.5–1.9)
Lactic Acid, Venous: 2.2 mmol/L (ref 0.5–1.9)
Lactic Acid, Venous: 2.2 mmol/L (ref 0.5–1.9)

## 2024-10-17 LAB — ECHOCARDIOGRAM LIMITED
MV M vel: 5.46 m/s
MV Peak grad: 119.2 mmHg
S' Lateral: 3.5 cm
Single Plane A4C EF: 49.1 %
Weight: 2518.54 [oz_av]

## 2024-10-17 LAB — CBC
HCT: 37.8 % (ref 36.0–46.0)
Hemoglobin: 12 g/dL (ref 12.0–15.0)
MCH: 28.7 pg (ref 26.0–34.0)
MCHC: 31.7 g/dL (ref 30.0–36.0)
MCV: 90.4 fL (ref 80.0–100.0)
Platelets: 181 K/uL (ref 150–400)
RBC: 4.18 MIL/uL (ref 3.87–5.11)
RDW: 13.1 % (ref 11.5–15.5)
WBC: 9.7 K/uL (ref 4.0–10.5)
nRBC: 0 % (ref 0.0–0.2)

## 2024-10-17 LAB — CBG MONITORING, ED: Glucose-Capillary: 289 mg/dL — ABNORMAL HIGH (ref 70–99)

## 2024-10-17 LAB — BETA-HYDROXYBUTYRIC ACID: Beta-Hydroxybutyric Acid: 0.89 mmol/L — ABNORMAL HIGH (ref 0.05–0.27)

## 2024-10-17 LAB — PROCALCITONIN: Procalcitonin: 0.1 ng/mL

## 2024-10-17 LAB — PRO BRAIN NATRIURETIC PEPTIDE: Pro Brain Natriuretic Peptide: 4214 pg/mL — ABNORMAL HIGH (ref <300.0)

## 2024-10-17 MED ORDER — FENTANYL CITRATE (PF) 50 MCG/ML IJ SOSY: 12.5000 ug | PREFILLED_SYRINGE | INTRAMUSCULAR | Status: DC | PRN

## 2024-10-17 MED ORDER — ACETAMINOPHEN 650 MG RE SUPP: 650.0000 mg | Freq: Four times a day (QID) | RECTAL | Status: DC | PRN

## 2024-10-17 MED ORDER — FUROSEMIDE 10 MG/ML IJ SOLN
40.0000 mg | Freq: Once | INTRAMUSCULAR | Status: AC
  Administered 2024-10-17: 40 mg via INTRAVENOUS
  Filled 2024-10-17: qty 4

## 2024-10-17 MED ORDER — INSULIN ASPART 100 UNIT/ML IJ SOLN
0.0000 [IU] | INTRAMUSCULAR | Status: DC
  Administered 2024-10-17 (×2): 2 [IU] via SUBCUTANEOUS
  Administered 2024-10-17: 1 [IU] via SUBCUTANEOUS
  Filled 2024-10-17: qty 1
  Filled 2024-10-17 (×2): qty 2

## 2024-10-17 MED ORDER — SODIUM CHLORIDE 0.9 % IV SOLN
2.0000 g | Freq: Once | INTRAVENOUS | Status: AC
  Administered 2024-10-17: 2 g via INTRAVENOUS
  Filled 2024-10-17: qty 12.5

## 2024-10-17 MED ORDER — SODIUM CHLORIDE 0.9% FLUSH
3.0000 mL | Freq: Two times a day (BID) | INTRAVENOUS | Status: DC
  Administered 2024-10-17 – 2024-10-18 (×2): 3 mL via INTRAVENOUS

## 2024-10-17 MED ORDER — LACTATED RINGERS IV BOLUS (SEPSIS)
500.0000 mL | Freq: Once | INTRAVENOUS | Status: AC
  Administered 2024-10-17: 500 mL via INTRAVENOUS

## 2024-10-17 MED ORDER — LINEZOLID 600 MG/300ML IV SOLN
600.0000 mg | Freq: Two times a day (BID) | INTRAVENOUS | Status: DC
  Administered 2024-10-17: 600 mg via INTRAVENOUS
  Filled 2024-10-17 (×2): qty 300

## 2024-10-17 MED ORDER — IOHEXOL 350 MG/ML SOLN
75.0000 mL | Freq: Once | INTRAVENOUS | Status: AC | PRN
  Administered 2024-10-17: 75 mL via INTRAVENOUS

## 2024-10-17 MED ORDER — HEPARIN BOLUS VIA INFUSION
4000.0000 [IU] | Freq: Once | INTRAVENOUS | Status: AC
  Administered 2024-10-17: 4000 [IU] via INTRAVENOUS
  Filled 2024-10-17: qty 4000

## 2024-10-17 MED ORDER — ISOSORBIDE MONONITRATE ER 60 MG PO TB24
60.0000 mg | ORAL_TABLET | Freq: Every day | ORAL | Status: DC
  Administered 2024-10-17: 60 mg via ORAL
  Filled 2024-10-17: qty 1

## 2024-10-17 MED ORDER — INSULIN ASPART 100 UNIT/ML IJ SOLN
0.0000 [IU] | Freq: Three times a day (TID) | INTRAMUSCULAR | Status: DC
  Administered 2024-10-17: 1 [IU] via SUBCUTANEOUS
  Administered 2024-10-18 (×2): 2 [IU] via SUBCUTANEOUS
  Administered 2024-10-18: 1 [IU] via SUBCUTANEOUS
  Administered 2024-10-18: 2 [IU] via SUBCUTANEOUS
  Administered 2024-10-19: 3 [IU] via SUBCUTANEOUS
  Administered 2024-10-19: 2 [IU] via SUBCUTANEOUS
  Filled 2024-10-17: qty 1
  Filled 2024-10-17: qty 2
  Filled 2024-10-17 (×2): qty 1
  Filled 2024-10-17 (×2): qty 2
  Filled 2024-10-17: qty 3

## 2024-10-17 MED ORDER — FUROSEMIDE 10 MG/ML IJ SOLN
40.0000 mg | Freq: Two times a day (BID) | INTRAMUSCULAR | Status: DC
  Administered 2024-10-17 – 2024-10-18 (×4): 40 mg via INTRAVENOUS
  Filled 2024-10-17 (×4): qty 4

## 2024-10-17 MED ORDER — ASPIRIN 81 MG PO TBEC
244.0000 mg | DELAYED_RELEASE_TABLET | Freq: Once | ORAL | Status: AC
  Administered 2024-10-17: 244 mg via ORAL

## 2024-10-17 MED ORDER — ASPIRIN 325 MG PO TBEC: 325.0000 mg | DELAYED_RELEASE_TABLET | Freq: Once | ORAL | Status: DC

## 2024-10-17 MED ORDER — SACUBITRIL-VALSARTAN 24-26 MG PO TABS
1.0000 | ORAL_TABLET | Freq: Two times a day (BID) | ORAL | Status: DC
  Administered 2024-10-17 (×2): 1 via ORAL
  Filled 2024-10-17 (×2): qty 1

## 2024-10-17 MED ORDER — ATORVASTATIN CALCIUM 40 MG PO TABS
40.0000 mg | ORAL_TABLET | Freq: Every day | ORAL | Status: DC
  Administered 2024-10-17 – 2024-10-22 (×6): 40 mg via ORAL
  Filled 2024-10-17 (×6): qty 1

## 2024-10-17 MED ORDER — SACUBITRIL-VALSARTAN 97-103 MG PO TABS
1.0000 | ORAL_TABLET | Freq: Two times a day (BID) | ORAL | Status: DC
  Filled 2024-10-17: qty 1

## 2024-10-17 MED ORDER — HEPARIN (PORCINE) 25000 UT/250ML-% IV SOLN
850.0000 [IU]/h | INTRAVENOUS | Status: DC
  Administered 2024-10-17: 850 [IU]/h via INTRAVENOUS
  Filled 2024-10-17: qty 250

## 2024-10-17 MED ORDER — CARVEDILOL 12.5 MG PO TABS
12.5000 mg | ORAL_TABLET | Freq: Two times a day (BID) | ORAL | Status: DC
  Administered 2024-10-17 – 2024-10-18 (×4): 12.5 mg via ORAL
  Filled 2024-10-17 (×4): qty 1

## 2024-10-17 MED ORDER — LACTATED RINGERS IV SOLN: INTRAVENOUS | Status: DC

## 2024-10-17 MED ORDER — ONDANSETRON HCL 4 MG/2ML IJ SOLN
4.0000 mg | Freq: Four times a day (QID) | INTRAMUSCULAR | Status: AC | PRN
  Administered 2024-10-17 – 2024-10-19 (×2): 4 mg via INTRAVENOUS
  Filled 2024-10-17 (×2): qty 2

## 2024-10-17 MED ORDER — OXYCODONE HCL 5 MG PO TABS
5.0000 mg | ORAL_TABLET | ORAL | Status: DC | PRN
  Administered 2024-10-17: 5 mg via ORAL
  Filled 2024-10-17: qty 1

## 2024-10-17 MED ORDER — HEPARIN BOLUS VIA INFUSION
2000.0000 [IU] | Freq: Once | INTRAVENOUS | Status: AC
  Administered 2024-10-17: 2000 [IU] via INTRAVENOUS
  Filled 2024-10-17: qty 2000

## 2024-10-17 MED ORDER — ACETAMINOPHEN 325 MG PO TABS
650.0000 mg | ORAL_TABLET | Freq: Four times a day (QID) | ORAL | Status: DC | PRN
  Administered 2024-10-20: 650 mg via ORAL
  Filled 2024-10-17: qty 2

## 2024-10-17 MED ORDER — SENNA 8.6 MG PO TABS
1.0000 | ORAL_TABLET | Freq: Every day | ORAL | Status: DC | PRN
  Administered 2024-10-18: 8.6 mg via ORAL
  Filled 2024-10-17: qty 1

## 2024-10-17 MED ORDER — HYDRALAZINE HCL 50 MG PO TABS
100.0000 mg | ORAL_TABLET | Freq: Three times a day (TID) | ORAL | Status: DC
  Administered 2024-10-17 – 2024-10-21 (×10): 100 mg via ORAL
  Filled 2024-10-17 (×11): qty 2

## 2024-10-17 MED ORDER — SODIUM CHLORIDE 0.9 % IV SOLN
12.5000 mg | Freq: Once | INTRAVENOUS | Status: AC
  Administered 2024-10-17: 12.5 mg via INTRAVENOUS
  Filled 2024-10-17: qty 12.5

## 2024-10-17 MED ORDER — HEPARIN (PORCINE) 25000 UT/250ML-% IV SOLN
1150.0000 [IU]/h | INTRAVENOUS | Status: DC
  Administered 2024-10-17: 850 [IU]/h via INTRAVENOUS
  Administered 2024-10-18 – 2024-10-19 (×2): 1150 [IU]/h via INTRAVENOUS
  Filled 2024-10-17 (×2): qty 250

## 2024-10-17 MED ORDER — ASPIRIN 81 MG PO TBEC
81.0000 mg | DELAYED_RELEASE_TABLET | Freq: Every day | ORAL | Status: DC
  Administered 2024-10-17 – 2024-10-22 (×5): 81 mg via ORAL
  Filled 2024-10-17 (×7): qty 1

## 2024-10-17 MED ORDER — SPIRONOLACTONE 25 MG PO TABS
50.0000 mg | ORAL_TABLET | Freq: Every day | ORAL | Status: DC
  Administered 2024-10-17: 50 mg via ORAL
  Filled 2024-10-17: qty 2

## 2024-10-17 MED ORDER — METOCLOPRAMIDE HCL 5 MG/ML IJ SOLN
5.0000 mg | Freq: Once | INTRAMUSCULAR | Status: AC
  Administered 2024-10-17: 5 mg via INTRAVENOUS
  Filled 2024-10-17: qty 2

## 2024-10-17 NOTE — Progress Notes (Signed)
 Critical Troponin 676 called to Dr Otelia, no new orders given

## 2024-10-17 NOTE — ED Notes (Signed)
 Blood cultures drawn x2 before starting antibiotics.

## 2024-10-17 NOTE — Progress Notes (Signed)
 PHARMACY - ANTICOAGULATION CONSULT NOTE  Pharmacy Consult for Heparin  Indication: chest pain/ACS  Allergies  Allergen Reactions   Metformin  And Related Other (See Comments)    Kidney injury, GI upset, diarrhea   Amoxicillin  Diarrhea   Farxiga  [Dapagliflozin ] Other (See Comments)    Yeast infections, recurrent    Patient Measurements: Weight: 71.4 kg (157 lb 6.5 oz)  Vital Signs: Temp: 97.5 F (36.4 C) (11/29 0516) Temp Source: Oral (11/29 0516) BP: 147/81 (11/29 0516) Pulse Rate: 93 (11/29 0516)  Labs: Recent Labs    10/16/24 1931 10/17/24 0046 10/17/24 0256  HGB 12.6 13.3  --   HCT 39.1 39.0  --   PLT 186  --   --   CREATININE 1.61*  --  1.39*    Estimated Creatinine Clearance: 36.7 mL/min (A) (by C-G formula based on SCr of 1.39 mg/dL (H)).   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   OU progressive, will medically need to have cataract surgery with lens will implantation so as to allow for complete clearance of the vitreous cavity in anticipation of likely vitrectomy in the future for progressive PDR despite previous PRP as delineated by the E DRS series of reports   Diabetes mellitus type 2 with complications (HCC)    diagnosed probably 2012 per patient   Diabetic retinopathy (HCC)    Hypertension    Rheumatoid arthritis (HCC)    Septic olecranon bursitis of right elbow 07/11/2021    Medications:  Medications Prior to Admission  Medication Sig Dispense Refill Last Dose/Taking   aspirin  EC 81 MG tablet Take 1 tablet (81 mg total) by mouth daily. 90 tablet 3    atorvastatin  (LIPITOR) 40 MG tablet Take 1 tablet (40 mg total) by mouth daily. 90 tablet 3    carvedilol  (COREG ) 12.5 MG tablet Take 1 tablet (12.5 mg total) by mouth 2 (two) times daily. 180 tablet 1    ENTRESTO  97-103 MG TAKE 1 TABLET BY MOUTH TWICE DAILY 60 tablet 6    ferrous gluconate  (FERGON) 324 MG  tablet Take 1 tablet (324 mg total) by mouth 2 (two) times daily with a meal. 180 tablet 1    furosemide  (LASIX ) 40 MG tablet Take 1 tablet (40 mg total) by mouth daily. Take extra dose for weight gain of 3lb in 1dy or 5lb in 1 week 90 tablet 3    glipiZIDE  (GLUCOTROL ) 5 MG tablet Take 1 tablet (5 mg total) by mouth 2 (two) times daily before a meal. 60 tablet 2    hydrALAZINE  (APRESOLINE ) 100 MG tablet Take 1 tablet (100 mg total) by mouth 3 (three) times daily. 270 tablet 3    isosorbide  mononitrate (IMDUR ) 60 MG 24 hr tablet Take 1 tablet (60 mg total) by mouth daily. 30 tablet 11    latanoprost (XALATAN) 0.005 % ophthalmic solution Place 1 drop into both eyes at bedtime.      leflunomide  (ARAVA ) 20 MG tablet Take 1 tablet (20 mg total) by mouth daily. 90 tablet 0    linagliptin  (TRADJENTA ) 5 MG TABS tablet Take 1 tablet (5 mg total) by mouth daily. 90 tablet 1    methocarbamol  (ROBAXIN ) 500 MG tablet Take 1 tablet (500 mg total) by mouth at bedtime as needed for muscle spasms. 30 tablet 0    potassium chloride  SA (KLOR-CON  M) 20 MEQ tablet Take 1 tablet (20 mEq total) by mouth  2 (two) times daily. 180 tablet 1    spironolactone  (ALDACTONE ) 50 MG tablet Take 1 tablet (50 mg total) by mouth daily. 60 tablet 3    Vitamin D , Ergocalciferol , (DRISDOL ) 1.25 MG (50000 UNIT) CAPS capsule Take 1 capsule (50,000 Units total) by mouth every 7 (seven) days. 12 capsule 2     Assessment: 68 y.o. female admitted with CHF, elevated troponin and possible ACS, for heparin   Goal of Therapy:  Heparin  level 0.3-0.7 units/ml Monitor platelets by anticoagulation protocol: Yes   Plan:  Heparin  4000 units IV bolus, then start heparin  850 units/hr Check heparin  level in 8 hours.   Dail Cordella Misty 10/17/2024,5:26 AM

## 2024-10-17 NOTE — ED Provider Notes (Signed)
 Physical Exam  BP (!) 140/70   Pulse 94   Temp 97.8 F (36.6 C) (Oral)   Resp 20   SpO2 100%   Physical Exam  Procedures  .Critical Care  Performed by: Jerrol Agent, MD Authorized by: Jerrol Agent, MD   Critical care provider statement:    Critical care time (minutes):  30   Critical care was necessary to treat or prevent imminent or life-threatening deterioration of the following conditions:  Respiratory failure   Critical care was time spent personally by me on the following activities:  Development of treatment plan with patient or surrogate, discussions with consultants, evaluation of patient's response to treatment, examination of patient, ordering and review of laboratory studies, ordering and review of radiographic studies, ordering and performing treatments and interventions, pulse oximetry, re-evaluation of patient's condition and review of old charts   Care discussed with: admitting provider     ED Course / MDM   Clinical Course as of 10/17/24 0225  Sat Oct 17, 2024  0045 Troponin T High Sensitivity(!!): 175 [JL]  0135 Lactic Acid, Venous(!!): 2.2 [JL]  0135 Pro Brain Natriuretic Peptide(!): 4,214.0 [JL]  0135 WBC(!): 12.4 [JL]  0224 Glucose-Capillary(!): 289 [JL]  0224 pH, Ven: 7.272 [JL]    Clinical Course User Index [JL] Jerrol Agent, MD   Medical Decision Making Amount and/or Complexity of Data Reviewed Labs: ordered. Decision-making details documented in ED Course. Radiology: ordered.  Risk Prescription drug management. Decision regarding hospitalization.   68 year old female presenting with concern for acute hypoxic respiratory failure, sepsis from a multifocal pneumonia seen on x-ray.  Additionally considered CHF exacerbation, PE.  Code sepsis has been initiated from the prior provider evaluation and the patient has been covered with broad-spectrum antibiotics, administered gentle IV fluid resuscitation in the setting of concern for  sepsis.  Labs: Cardiac troponin 175, BNP elevated at 4214, repeat troponin pending, urinalysis positive for UTI.  Patient had denied chest pain, denied shortness of breath, denied abdominal pain, denied genitourinary symptoms.  Had presented with vomiting and malaise.  CTA PE performed: IMPRESSION:  1. No pulmonary embolism.  2. Findings favor worsening pulmonary edema superimposed on background of  emphysema when compared with 8 / 15 / 25. Multifocal pneumonia could appear  similarly.  3. Small right pleural effusion, similar to prior.   I evaluated the patient bedside.  She is resting comfortably on a nonrebreather which RT is actively de-escalating.  CT imaging showed either pulmonary edema versus multifocal pneumonia.  Given the patient's leukocytosis, will treat for multifocal pneumonia.  COVID flu and RSV PCR testing was negative.  BNP and cardiac troponin was elevated.  Patient also with many bacteria, 21-50 WBCs and moderate leukocytes present on urinalysis.  She did endorse some discomfort on urinating yesterday.  Repeat cardiac troponin climbing at 225.  The patient denies any active chest pain, denies shortness of breath, denies cough.  She endorses fatigue.  And her only other symptom had been nausea and vomiting.  Considered viral myocarditis, considered ACS, CHF exacerbation.  Patient also noted to be borderline in DKA with an anion gap acidosis, bicarbonate 21, anion gap 17, blood glucose 263, VBG with an acidosis with a pH of 7.27, however bicarbonate on VBG 24.  Will recheck a BMP.  Blood glucose persistently elevated at 289 after 1 L of fluids.  Discussed with hospitalist medicine, will give a dose of Lasix  40mg .  Will continue to trend cardiac troponin, follow-up beta-hydroxybutyrate, repeat BMP.  Patient subsequently admitted  to the hospital service in guarded condition.  Repeat BMP after fluids revealed normal bicarbonate and anion gap, persistent hyperglycemia. 3rd cardiac  troponin still climbing to 274 however pt without CP and SOB at this time. Repeat Lactic acid normalized to 1.8. Given climbing troponin, will initiate heparin , full strength aspirin  also ordered. Pt admitted in stabilized condition.      Jerrol Agent, MD 10/17/24 [phone]

## 2024-10-17 NOTE — Consult Note (Addendum)
 Cardiology Consultation   Patient ID: Maria Foster MRN: [id number]; DOB: [birth year]  Admit date: 10/16/2024 Date of Consult: 10/17/2024  PCP:  Bulah Alm RAMAN, PA-C   [facility] Providers Cardiologist:  Annabella Scarce, MD        Patient Profile: Maria Foster is a 68 y.o. female with a hx of hypertension, type 2 diabetes, obstructive sleep apnea with CPAP intolerance, rheumatoid arthritis, CKD stage 3A, PAD, who is being seen 10/17/2024 for the evaluation of elevated troponin and abnormal echo at the request of the hospitalist team.  History of Present Illness: Maria Foster tells me that on Friday while she was laying down, started to have nausea followed by several episodes of vomiting and periumbilical abdominal pain.  No blood in vomitus or coffee-ground emesis.  No changes in bowel habits, and last bowel movement was yesterday morning and was normal.  No chest pain, shortness of breath, PND, orthopnea, leg swelling, palpitation, presyncope, or syncope.  In 2022 echo showed an EF of 35 to 40%; sequently, she underwent left and right heart cath, which showed three-vessel disease CAD with totally occluded RCA with left-to-right collaterals, 80% lesion in mid LCx and mild nonobstructive CAD in LAD.  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  In 2023, her EF improved to 45 to 50%.  Most recently in August 2025 her EF was 70 to 75%.  TTE today showed newly reduced EF with severe hypokinesis with regional wall motion abnormalities and mild to moderate MR.  In terms of GDMT, she has been on Coreg  12.5 BID, Entresto  97-103 mg BID, hydralazine  100 mg TID, Imdur  60 mg daily, and spironolactone  50 mg daily  EKG on admission showed normal sinus rhythm, anterior Q waves that have been chronic and no acute ischemic changes. Troponin trend was 175 => 225 => 274. Lactate on  presentation was mildly elevated to 2.2 and subsequently improved to 1.8. PCT 0.1. CT PE protocol showed no PE but showed pulmonary edema on background of emphysema.   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   OU progressive, will medically need to have cataract surgery with lens will implantation so as to allow for complete clearance of the vitreous cavity in anticipation of likely vitrectomy in the future for progressive PDR despite previous PRP as delineated by the E DRS series of reports   Diabetes mellitus type 2 with complications (HCC)    diagnosed probably 2012 per patient   Diabetic retinopathy (HCC)    Hypertension    Rheumatoid arthritis (HCC)    Septic olecranon bursitis of right elbow 07/11/2021    Past Surgical History:  Procedure Laterality Date   INCISION AND DRAINAGE ABSCESS N/A 07/11/2021   Procedure: INCISION AND DRAINAGE RIGHT ELBOW;  Surgeon: Ernie Cough, MD;  Location: [facility];  Service: Orthopedics;  Laterality: N/A;   PRP Bilateral OD - 07/23/2017 OS - 08/06/2017   RIGHT/LEFT HEART CATH AND CORONARY ANGIOGRAPHY N/A 01/05/2021   Procedure: RIGHT/LEFT HEART CATH AND CORONARY ANGIOGRAPHY;  Surgeon: Cherrie Toribio JONELLE, MD;  Location: [facility] INVASIVE CV LAB;  Service: Cardiovascular;  Laterality: N/A;   TEE WITHOUT CARDIOVERSION N/A 10/09/2021   Procedure: TRANSESOPHAGEAL ECHOCARDIOGRAM (TEE);  Surgeon: Cherrie Toribio JONELLE, MD;  Location: [facility] ENDOSCOPY;  Service: Cardiovascular;  Laterality: N/A;     Home Medications:  Prior to Admission medications   Medication Sig Start Date End Date Taking? Authorizing Provider  aspirin  EC 81 MG tablet Take 1 tablet (81 mg total) by mouth daily. 07/09/24   Tysinger, Alm RAMAN, PA-C  atorvastatin  (LIPITOR) 40 MG tablet Take 1 tablet (40 mg total) by mouth daily. 02/25/24   Walker, Caitlin S, NP  carvedilol  (COREG ) 12.5 MG tablet Take 1 tablet (12.5 mg total) by  mouth 2 (two) times daily. 07/09/24   Tysinger, Alm RAMAN, PA-C  ENTRESTO  97-103 MG TAKE 1 TABLET BY MOUTH TWICE DAILY 06/04/24   Walker, Caitlin S, NP  ferrous gluconate  (FERGON) 324 MG tablet Take 1 tablet (324 mg total) by mouth 2 (two) times daily with a meal. 07/09/24   Tysinger, Alm RAMAN, PA-C  furosemide  (LASIX ) 40 MG tablet Take 1 tablet (40 mg total) by mouth daily. Take extra dose for weight gain of 3lb in 1dy or 5lb in 1 week 07/05/24   Fairy Frames, MD  glipiZIDE  (GLUCOTROL ) 5 MG tablet Take 1 tablet (5 mg total) by mouth 2 (two) times daily before a meal. 07/09/24   Tysinger, Alm RAMAN, PA-C  hydrALAZINE  (APRESOLINE ) 100 MG tablet Take 1 tablet (100 mg total) by mouth 3 (three) times daily. 02/25/24   Vannie Reche RAMAN, NP  isosorbide  mononitrate (IMDUR ) 60 MG 24 hr tablet Take 1 tablet (60 mg total) by mouth daily. 02/25/24   Walker, Caitlin S, NP  latanoprost (XALATAN) 0.005 % ophthalmic solution Place 1 drop into both eyes at bedtime.    [provider]  leflunomide  (ARAVA ) 20 MG tablet Take 1 tablet (20 mg total) by mouth daily. 10/12/24   Jeannetta Lonni ORN, MD  linagliptin  (TRADJENTA ) 5 MG TABS tablet Take 1 tablet (5 mg total) by mouth daily. 07/09/24   Tysinger, Alm RAMAN, PA-C  methocarbamol  (ROBAXIN ) 500 MG tablet Take 1 tablet (500 mg total) by mouth at bedtime as needed for muscle spasms. 07/08/24   Jeannetta Lonni ORN, MD  potassium chloride  SA (KLOR-CON  M) 20 MEQ tablet Take 1 tablet (20 mEq total) by mouth 2 (two) times daily. 07/09/24   Tysinger, Alm RAMAN, PA-C  spironolactone  (ALDACTONE ) 50 MG tablet Take 1 tablet (50 mg total) by mouth daily. 03/17/24   Raford Riggs, MD  Vitamin D , Ergocalciferol , (DRISDOL ) 1.25 MG (50000 UNIT) CAPS capsule Take 1 capsule (50,000 Units total) by mouth every 7 (seven) days. 07/27/24   Tysinger, Alm RAMAN, PA-C    Scheduled Meds:  aspirin  EC  81 mg Oral Daily   atorvastatin   40 mg Oral Daily   carvedilol   12.5 mg Oral BID   furosemide   40 mg  Intravenous BID   hydrALAZINE   100 mg Oral TID   insulin  aspart  0-6 Units Subcutaneous Q4H   sacubitril -valsartan   1 tablet Oral BID   sodium chloride  flush  3 mL Intravenous Q12H   spironolactone   50 mg Oral Daily   Continuous Infusions:  heparin  850 Units/hr (10/17/24 1742)   PRN Meds: acetaminophen  **OR** acetaminophen , ondansetron  (ZOFRAN ) IV, oxyCODONE , senna  Allergies:    Allergies  Allergen Reactions   Metformin  And Related Other (See Comments)    Kidney injury, GI upset, diarrhea   Amoxicillin  Diarrhea   Farxiga  [Dapagliflozin ] Other (See Comments)    Yeast infections, recurrent    Social History:   Social History   Socioeconomic History   Marital status: Married    Spouse name: Not on file   Number of children: Not on file  Years of education: Not on file   Highest education level: Not on file  Occupational History   Occupation: retired  Tobacco Use   Smoking status: Former    Current packs/day: 0.00    Average packs/day: 0.5 packs/day for 15.0 years (7.5 ttl pk-yrs)    Types: Cigarettes    Start date: 09/2007    Quit date: 09/2022    Years since quitting: 2.0    Passive exposure: Past   Smokeless tobacco: Never   Tobacco comments:    Quit 10/28/2022  Vaping Use   Vaping status: Never Used  Substance and Sexual Activity   Alcohol use: Not Currently   Drug use: No   Sexual activity: Not on file  Other Topics Concern   Not on file  Social History Narrative   Not on file   Social Drivers of Health   Financial Resource Strain: Low Risk  (12/10/2023)   Overall Financial Resource Strain (CARDIA)    Difficulty of Paying Living Expenses: Not hard at all  Food Insecurity: No Food Insecurity (10/17/2024)   Hunger Vital Sign    Worried About Running Out of Food in the Last Year: Never true    Ran Out of Food in the Last Year: Never true  Transportation Needs: No Transportation Needs (10/17/2024)   PRAPARE - Administrator, Civil Service  (Medical): No    Lack of Transportation (Non-Medical): No  Physical Activity: Insufficiently Active (12/10/2023)   Exercise Vital Sign    Days of Exercise per Week: 7 days    Minutes of Exercise per Session: 20 min  Stress: No Stress Concern Present (12/10/2023)   [employer] of Occupational Health - Occupational Stress Questionnaire    Feeling of Stress : Not at all  Social Connections: Unknown (10/17/2024)   Social Connection and Isolation Panel    Frequency of Communication with Friends and Family: More than three times a week    Frequency of Social Gatherings with Friends and Family: More than three times a week    Attends Religious Services: Never    Database Administrator or Organizations: Not on file    Attends Banker Meetings: Not on file    Marital Status: Not on file  Intimate Partner Violence: Not At Risk (10/17/2024)   Humiliation, Afraid, Rape, and Kick questionnaire    Fear of Current or Ex-Partner: No    Emotionally Abused: No    Physically Abused: No    Sexually Abused: No    Family History:   Family History  Problem Relation Age of Onset   Heart attack Mother    Heart failure Mother    Diabetes Mother    Hypertension Mother    Heart failure Sister    Heart Problems Sister    Heart failure Sister    Hypertension Daughter    Amblyopia Neg Hx    Blindness Neg Hx    Cataracts Neg Hx    Glaucoma Neg Hx    Macular degeneration Neg Hx    Retinal detachment Neg Hx    Strabismus Neg Hx    Retinitis pigmentosa Neg Hx      ROS:  Please see the history of present illness.   All other ROS reviewed and negative.     Physical Exam/Data: Vitals:   10/17/24 0516 10/17/24 0752 10/17/24 1129 10/17/24 1553  BP: (!) 147/81 123/66 105/68 (!) 162/91  Pulse: 93 90 84 86  Resp: 19 15 20  17  Temp: (!) 97.5 F (36.4 C) 97.8 F (36.6 C) 98.2 F (36.8 C) (!) 97.5 F (36.4 C)  TempSrc: Oral Oral Oral Oral  SpO2: 92% 92% 93% 94%  Weight:         Intake/Output Summary (Last 24 hours) at 10/17/2024 1754 Last data filed at 10/17/2024 0226 Gross per 24 hour  Intake 301.35 ml  Output --  Net 301.35 ml      10/17/2024    5:15 AM 10/12/2024    3:19 PM 08/07/2024   12:33 PM  Last 3 Weights  Weight (lbs) 157 lb 6.5 oz 160 lb 12.8 oz 159 lb  Weight (kg) 71.4 kg 72.938 kg 72.122 kg     Body mass index is 27.88 kg/m.  General:  Well nourished, well developed, in no acute distress HEENT: normal Neck: Mildly elevated JVD (~8-10 cm) Vascular: No carotid bruits; Distal pulses 2+ bilaterally Cardiac:  normal S1, S2; RRR; no murmur  Lungs:  bialteral crackles  Abd: soft, periumbilical tenderness  Ext: no edema Musculoskeletal:  No deformities, BUE and BLE strength normal and equal Skin: warm and dry  Neuro:  CNs 2-12 intact, no focal abnormalities noted Psych:  Normal affect   EKG:  The EKG was personally reviewed and demonstrates:  normal sinus rhythm, anterior Q waves that have been chronic and no acute ischemic changes  Relevant CV Studies: TTE 10/17/2024:  1. The left ventricle demonstrates regional wall motion abnormalities  (see scoring diagram/findings for description). There is mild left  ventricular hypertrophy of the basal-septal segment. Left ventricular  diastolic function could not be evaluated.  There is severe hypokinesis of the left ventricular, mid-apical  anteroseptal wall, inferoseptal wall, anterior wall and inferior wall.  There is severe hypokinesis of the left ventricular, apical segment.   2. Right ventricular systolic function was not well visualized. Tricuspid  regurgitation signal is inadequate for assessing PA pressure.   3. Mild to moderate mitral valve regurgitation.   4. The aortic valve is tricuspid. Aortic valve regurgitation is mild to  moderate. Aortic valve sclerosis/calcification is present, without any  evidence of aortic stenosis.   5. Recommend repeat limited study wth definity  contrast to assess EF and wall motion abnormalities further   [facility] 12/2020: Ost RCA to Prox RCA lesion is 100% stenosed. Mid Cx lesion is 80% stenosed. 1st Mrg lesion is 100% stenosed. Ramus lesion is 30% stenosed. Mid LM to Dist LM lesion is 20% stenosed. Prox LAD lesion is 30% stenosed. Dist RCA lesion is 100% stenosed.   Findings:   Ao = 154.73 (104) LV = 143/20 RA =  13 RV = 65/15 PA = 60/24 (37)  PCW = 27 (v = 40) Fick cardiac output/index = 4.7/2.7 PVR = 2.1 WU SVR = 1552 Ao sat = 97% PA sat = 64%, 64% Assessment: 1. 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 80% lesion in mLCX 3. Mild non-obstructive CAD in LAD 4. EF 40% with elevated filling pressures and normal CO   Laboratory Data: High Sensitivity Troponin:  No results for input(s): TROPONINIHS in the last 720 hours.   Chemistry Recent Labs  Lab 10/12/24 1551 10/16/24 1931 10/17/24 0046 10/17/24 0256 10/17/24 0622  NA 139 140 140 136  --   K 4.2 4.2 4.1 4.3  --   CL 107 102  --  101  --   CO2 24 21*  --  22  --   GLUCOSE 195* 263*  --  287*  --   BUN 27* 36*  --  36*  --   CREATININE 1.31* 1.61*  --  1.39*  --   CALCIUM  9.5 10.0  --  9.4  --   MG  --   --   --   --  2.0  GFRNONAA  --  34*  --  41*  --   ANIONGAP  --  17*  --  13  --     Recent Labs  Lab 10/12/24 1551 10/16/24 1931  PROT 7.5 8.4*  ALBUMIN  --  4.4  AST 13 17  ALT 10 9  ALKPHOS  --  142*  BILITOT 0.4 0.6   Lipids No results for input(s): CHOL, TRIG, HDL, LABVLDL, LDLCALC, CHOLHDL in the last 168 hours.  Hematology Recent Labs  Lab 10/12/24 1551 10/16/24 1931 10/17/24 0046 10/17/24 0858  WBC 7.3 12.4*  --  9.7  RBC 4.43 4.35  --  4.18  HGB 12.5 12.6 13.3 12.0  HCT 40.5 39.1 39.0 37.8  MCV 91.4 89.9  --  90.4  MCH 28.2 29.0  --  28.7  MCHC 30.9* 32.2  --  31.7  RDW 11.8 13.2  --  13.1  PLT 194 186  --  181   Thyroid  No results for input(s): TSH, FREET4 in the last 168 hours.   BNP Recent Labs  Lab 10/16/24 2338  PROBNP 4,214.0*    DDimer No results for input(s): DDIMER in the last 168 hours.  Radiology/Studies:  ECHOCARDIOGRAM LIMITED Result Date: 10/17/2024    ECHOCARDIOGRAM LIMITED REPORT   Patient Name:   SANTANA GOSDIN Date of Exam: 10/17/2024 Medical Rec #:  [id number]       Height:       63.0 in Accession #:    [id number]      Weight:       157.4 lb Date of Birth:  16-Jun-1956       BSA:          1.747 m Patient Age:    68 years        BP:           163/82 mmHg Patient Gender: F               HR:           86 bpm. Exam Location:  Inpatient Procedure: Limited Echo, Cardiac Doppler and Color Doppler (Both Spectral and            Color Flow Doppler were utilized during procedure). Indications:    CHF- Acute Diastolic I50.31, Elevated Troponin  History:        Patient has prior history of Echocardiogram examinations, most                 recent 07/04/2024. CHF; Risk Factors:Hypertension, Sleep Apnea,                 Diabetes, Former Smoker and elevated troponin.  Sonographer:    Koleen Popper RDCS Referring Phys: [id number] TIMOTHY S OPYD IMPRESSIONS  1. The left ventricle demonstrates regional wall motion abnormalities (see scoring diagram/findings for description). There is mild left ventricular hypertrophy of the basal-septal segment. Left ventricular diastolic function could not be evaluated. There is severe hypokinesis of the left ventricular, mid-apical anteroseptal wall, inferoseptal wall, anterior wall and inferior wall. There is severe hypokinesis of the left ventricular, apical segment.  2. Right ventricular systolic function was not well visualized. Tricuspid regurgitation signal is inadequate  for assessing PA pressure.  3. Mild to moderate mitral valve regurgitation.  4. The aortic valve is tricuspid. Aortic valve regurgitation is mild to moderate. Aortic valve sclerosis/calcification is present, without any evidence of aortic stenosis.  5. Recommend repeat  limited study wth definity contrast to assess EF and wall motion abnormalities further FINDINGS  Left Ventricle: The left ventricle demonstrates regional wall motion abnormalities. Severe hypokinesis of the left ventricular, mid-apical anteroseptal wall, inferoseptal wall, anterior wall and inferior wall. There is mild left ventricular hypertrophy of the basal-septal segment. Left ventricular diastolic function could not be evaluated. Right Ventricle: Right ventricular systolic function was not well visualized. Tricuspid regurgitation signal is inadequate for assessing PA pressure. Mitral Valve: Mild to moderate mitral valve regurgitation. Tricuspid Valve: Tricuspid valve regurgitation is trivial. Aortic Valve: The aortic valve is tricuspid. Aortic valve regurgitation is mild to moderate. Aortic valve sclerosis/calcification is present, without any evidence of aortic stenosis. Additional Comments: Spectral Doppler performed. Color Doppler performed.  LEFT VENTRICLE PLAX 2D LVIDd:         4.70 cm      Diastology LVIDs:         3.50 cm      LV e' medial:  4.35 cm/s LV PW:         0.90 cm      LV e' lateral: 5.77 cm/s LV IVS:        1.20 cm LVOT diam:     1.90 cm LV SV:         57 LV SV Index:   33 LVOT Area:     2.84 cm  LV Volumes (MOD) LV vol d, MOD A4C: 148.0 ml LV vol s, MOD A4C: 75.4 ml LV SV MOD A4C:     148.0 ml RIGHT VENTRICLE         IVC TAPSE (M-mode): 2.5 cm  IVC diam: 1.50 cm LEFT ATRIUM           Index LA diam:      3.70 cm 2.12 cm/m LA Vol (A4C): 30.2 ml 17.29 ml/m  AORTIC VALVE LVOT Vmax:   91.20 cm/s LVOT Vmean:  59.700 cm/s LVOT VTI:    0.201 m  AORTA Ao Root diam: 2.70 cm MR Peak grad: 119.2 mmHg MR Vmax:      546.00 cm/s SHUNTS                           Systemic VTI:  0.20 m                           Systemic Diam: 1.90 cm Wilbert Bihari MD Electronically signed by Wilbert Bihari MD Signature Date/Time: 10/17/2024/4:24:43 PM    Final    CT Angio Chest PE W and/or Wo Contrast Result Date:  10/17/2024 EXAM: CTA CHEST 10/17/2024 01:12:53 AM TECHNIQUE: CTA of the chest was performed without and with the administration of 75 mL of iohexol  (OMNIPAQUE ) 350 MG/ML injection. Multiplanar reformatted images are provided for review. MIP images are provided for review. Automated exposure control, iterative reconstruction, and/or weight based adjustment of the mA/kV was utilized to reduce the radiation dose to as low as reasonably achievable. COMPARISON: Comparison with 07/03/2024. CLINICAL HISTORY: Pulmonary embolism (PE) suspected, high prob. FINDINGS: PULMONARY ARTERIES: Pulmonary arteries are adequately opacified for evaluation. No acute pulmonary embolus. Main pulmonary artery is normal in caliber. MEDIASTINUM: Coronary artery atherosclerotic calcification. Aortic atherosclerotic calcification. There  is no acute abnormality of the thoracic aorta. The pericardium demonstrates no acute abnormality. LYMPH NODES: No mediastinal, hilar or axillary lymphadenopathy. LUNGS AND PLEURA: Diffuse bronchial wall thickening and interlobular septal thickening in both lungs greater on the right. Patchy nodular ground glass opacities in the right middle lobe with more confluent consolidation in the right lower lobe. Ground glass opacities in the left lower lobe. Findings have progressed compared to 07/03/2024. Small right pleural effusion. Similar to prior. No pneumothorax. UPPER ABDOMEN: Limited images of the upper abdomen are unremarkable. SOFT TISSUES AND BONES: Fusion of anterior osteophytes in the mid and lower thoracic spine. No acute soft tissue abnormality. IMPRESSION: 1. No pulmonary embolism. 2. Findings favor worsening pulmonary edema superimposed on background of emphysema when compared with 8 / 15 / 25. Multifocal pneumonia could appear similarly. 3. Small right pleural effusion, similar to prior. Electronically signed by: Norman Gatlin MD 10/17/2024 01:25 AM EST RP Workstation: [id number]   DG Chest Portable  1 View Result Date: 10/16/2024 CLINICAL DATA:  Chest pain EXAM: PORTABLE CHEST 1 VIEW COMPARISON:  Chest x-ray 07/04/2024.  CT of the chest 07/03/2024. FINDINGS: There are diffuse widespread interstitial opacities throughout both lungs, right greater than left. There are additional mild patchy airspace opacities scattered throughout the right lung. The costophrenic angles are clear. The heart is mildly enlarged, unchanged. No pneumothorax or acute fracture. IMPRESSION: Diffuse widespread interstitial opacities throughout both lungs, right greater than left. There are additional mild patchy airspace opacities scattered throughout the right lung. Findings are concerning for multifocal pneumonia. Edema is not excluded. Electronically Signed   By: Greig Pique M.D.   On: 10/16/2024 23:54     Assessment and Plan: Acute systolic heart failure; newly reduced EF with regional wall motion abnormalities NSTEMI, type I versus type II  Patient presents with predominantly GI symptoms with nausea, vomiting, and periumbilical abdominal pain.  On physical examination, JVP is mildly elevated.  She also has periumbilical tenderness.  She is warm with no signs of poor perfusion.  EKG showed chronic anterior Q waves with no acute ischemic changes.  Troponin trend 175 => 225 => 274.  Echo showed newly reduced EF with regional WMA with the preserved basal segments.  IVC is normal respiratory variation.  Overall these findings could be consistent with an atypical presentation of an NSTEMI; however, the periumbilical tenderness makes me concerned about a concurrent abdominal process, and I think abdominal imaging may be needed to rule out intra-abdominal processes.  Alternatively, her cardiac findings could also be due to stress-induced cardiomyopathy versus type II MI.  For now, we should treat as an NSTEMI with heparin  gtt and would load with aspirin .  - Load with aspirin  325 mg and continue 81 mg daily - Continue heparin   gtt - She does not appear to be intravascularly volume overloaded at this point; however, she does have lung crackles on physical examination with pulmonary edema on CT so I would continue diuresis for now - Follow up on Definity contrast TTE tomorrow - Consider abdominal imaging to rule out intra-abdominal processes (she does not seem to be in a cardiogenic shock state to explain her abdominal symptoms and ACS is unlikely to explain periumbilical tenderness) - She has known unvascularized CAD; she will likely need L/RHC to redefine anatomy and assess her hemodynamics pending ruling out intraabdominal processes. - Continue her current GDMT regimen   Risk Assessment/Risk Scores:    TIMI Risk Score for Unstable Angina or Non-ST Elevation MI:  The patient's TIMI risk score is  , which indicates a  % risk of all cause mortality, new or recurrent myocardial infarction or need for urgent revascularization in the next 14 days.  [place]  Heart Association (NYHA) Functional Class NYHA Class II     For questions or updates, please contact [facility] Please consult [email or web address] for contact info under      Signed, Gillian CHRISTELLA Cass, MD  10/17/2024 5:54 PM

## 2024-10-17 NOTE — Sepsis Progress Note (Signed)
 Elink monitoring for the code sepsis protocol.

## 2024-10-17 NOTE — ED Notes (Signed)
 2nd attempt to call report to [facility] 3E room 10 unsuccessful.

## 2024-10-17 NOTE — Progress Notes (Addendum)
 Reported by RN re: recurrence of Nausea/vomiting,  -and notified by Cards re: New wall motion abnormalities on echo, her presentation could be ACS/NSTEMI -will start IV heparin , repeat troponin and consult Cardiology  Sigurd Pac MD

## 2024-10-17 NOTE — ED Provider Notes (Incomplete)
 [place] EMERGENCY DEPARTMENT AT [facility] Provider Note   CSN: [id number] Arrival date & time: 10/16/24  1850     Patient presents with: Emesis and Weakness   Maria Foster is a 68 y.o. female with past medical history of RA, HLD, HTN, T2DM, CKD stage III, HF, QT prolongation, IDA presents Emergency Department for evaluation of vomiting, general weakness that started at 12 today.  No diarrhea nor abdominal pain.  She states I just do not feel good.  Vomiting started at 1200 today. No diarrhea.  Ate Thanksgiving dinner yesterday and no other family members have any symptoms of vomiting nor diarrhea. Denies chest pain, shortness of breath, cough, congestion, fevers, known sick contacts, recent travel, recent antibiotics, dizziness, visual disturbances  {Add pertinent medical, surgical, social history, OB history to HPI:32947}  Emesis Weakness Associated symptoms: vomiting        Prior to Admission medications   Medication Sig Start Date End Date Taking? Authorizing Provider  aspirin  EC 81 MG tablet Take 1 tablet (81 mg total) by mouth daily. 07/09/24   Tysinger, Alm RAMAN, PA-C  atorvastatin  (LIPITOR) 40 MG tablet Take 1 tablet (40 mg total) by mouth daily. 02/25/24   Vannie Reche RAMAN, NP  carvedilol  (COREG ) 12.5 MG tablet Take 1 tablet (12.5 mg total) by mouth 2 (two) times daily. 07/09/24   Tysinger, Alm RAMAN, PA-C  ENTRESTO  97-103 MG TAKE 1 TABLET BY MOUTH TWICE DAILY 06/04/24   Walker, Caitlin S, NP  ferrous gluconate  (FERGON) 324 MG tablet Take 1 tablet (324 mg total) by mouth 2 (two) times daily with a meal. 07/09/24   Tysinger, Alm RAMAN, PA-C  furosemide  (LASIX ) 40 MG tablet Take 1 tablet (40 mg total) by mouth daily. Take extra dose for weight gain of 3lb in 1dy or 5lb in 1 week 07/05/24   Fairy Frames, MD  glipiZIDE  (GLUCOTROL ) 5 MG tablet Take 1 tablet (5 mg total) by mouth 2 (two) times daily before a meal. 07/09/24   Tysinger, Alm RAMAN, PA-C  hydrALAZINE   (APRESOLINE ) 100 MG tablet Take 1 tablet (100 mg total) by mouth 3 (three) times daily. 02/25/24   Vannie Reche RAMAN, NP  isosorbide  mononitrate (IMDUR ) 60 MG 24 hr tablet Take 1 tablet (60 mg total) by mouth daily. 02/25/24   Walker, Caitlin S, NP  latanoprost (XALATAN) 0.005 % ophthalmic solution Place 1 drop into both eyes at bedtime.    [provider]  leflunomide  (ARAVA ) 20 MG tablet Take 1 tablet (20 mg total) by mouth daily. 10/12/24   Jeannetta Lonni ORN, MD  linagliptin  (TRADJENTA ) 5 MG TABS tablet Take 1 tablet (5 mg total) by mouth daily. 07/09/24   Tysinger, Alm RAMAN, PA-C  methocarbamol  (ROBAXIN ) 500 MG tablet Take 1 tablet (500 mg total) by mouth at bedtime as needed for muscle spasms. 07/08/24   Rice, Lonni ORN, MD  potassium chloride  SA (KLOR-CON  M) 20 MEQ tablet Take 1 tablet (20 mEq total) by mouth 2 (two) times daily. 07/09/24   Tysinger, Alm RAMAN, PA-C  spironolactone  (ALDACTONE ) 50 MG tablet Take 1 tablet (50 mg total) by mouth daily. 03/17/24   Raford Riggs, MD  Vitamin D , Ergocalciferol , (DRISDOL ) 1.25 MG (50000 UNIT) CAPS capsule Take 1 capsule (50,000 Units total) by mouth every 7 (seven) days. 07/27/24   Tysinger, Alm RAMAN, PA-C    Allergies: Metformin  and related, Amoxicillin , and Farxiga  [dapagliflozin ]    Review of Systems  Gastrointestinal:  Positive for vomiting.  Neurological:  Positive for  weakness.    Updated Vital Signs BP (!) 172/80   Pulse (!) 103   Temp (!) 97.5 F (36.4 C) (Oral)   Resp (!) 25   SpO2 97%   Physical Exam Vitals and nursing note reviewed.  Constitutional:      General: She is not in acute distress.    Appearance: Normal appearance.     Comments: Restless in bed. Unable to get comfortable  HENT:     Head: Normocephalic and atraumatic.  Eyes:     Conjunctiva/sclera: Conjunctivae normal.  Cardiovascular:     Rate and Rhythm: Tachycardia present.  Pulmonary:     Effort: Pulmonary effort is normal. No respiratory distress.      Breath sounds: Normal breath sounds.  Musculoskeletal:     Right lower leg: No edema.     Left lower leg: No edema.  Skin:    Coloration: Skin is not jaundiced or pale.  Neurological:     Mental Status: She is alert. Mental status is at baseline.     (all labs ordered are listed, but only abnormal results are displayed) Labs Reviewed  COMPREHENSIVE METABOLIC PANEL WITH GFR - Abnormal; Notable for the following components:      Result Value   CO2 21 (*)    Glucose, Bld 263 (*)    BUN 36 (*)    Creatinine, Ser 1.61 (*)    Total Protein 8.4 (*)    Alkaline Phosphatase 142 (*)    GFR, Estimated 34 (*)    Anion gap 17 (*)    All other components within normal limits  CBC - Abnormal; Notable for the following components:   WBC 12.4 (*)    All other components within normal limits  URINALYSIS, ROUTINE W REFLEX MICROSCOPIC - Abnormal; Notable for the following components:   APPearance HAZY (*)    Glucose, UA 500 (*)    Hgb urine dipstick TRACE (*)    Ketones, ur 15 (*)    Protein, ur 30 (*)    Leukocytes,Ua MODERATE (*)    Bacteria, UA MANY (*)    All other components within normal limits  RESP PANEL BY RT-PCR (RSV, FLU A&B, COVID)  RVPGX2  LIPASE, BLOOD    EKG: None  Radiology: No results found.  {Document cardiac monitor, telemetry assessment procedure when appropriate:32947} Procedures   Medications Ordered in the ED - No data to display    {Click here for ABCD2, HEART and other calculators REFRESH Note before signing:1}                              Medical Decision Making Amount and/or Complexity of Data Reviewed Labs: ordered. Radiology: ordered.  Risk Prescription drug management.     Patient presents to the ED for concern of ***, this involves an extensive number of treatment options, and is a complaint that carries with it a high risk of complications and morbidity.  The differential diagnosis includes ***   Co morbidities that complicate the  patient evaluation  ***   Additional history obtained:  Additional history obtained from *** {Blank multiple:19196::EMS,Family,Nursing,Outside Medical Records,Past Admission}   External records from outside source obtained and reviewed including ***   Lab Tests:  I Ordered, and personally interpreted labs.  The pertinent results include:  ***   Imaging Studies ordered:  I ordered imaging studies including CXR  I independently visualized and interpreted imaging which showed *** I agree with  the radiologist interpretation   Cardiac Monitoring:  The patient was maintained on a cardiac monitor.  I personally viewed and interpreted the cardiac monitored which showed an underlying rhythm of: ***   Medicines ordered and prescription drug management:  I ordered medication including ***  for ***  Reevaluation of the patient after these medicines showed that the patient {resolved/improved/worsened:23923::improved} I have reviewed the patients home medicines and have made adjustments as needed augmentin   Test Considered:  ***   Critical Interventions:  ***   Consultations Obtained:  I requested consultation with the ***,  and discussed lab and imaging findings as well as pertinent plan - they recommend: ***   Problem List / ED Course:  Vomiting Tachycardia. Mild leukocytosis 12.4. will obtain lactic. No fever reported nor in ED but concern for sepsis with sx, tachycardia, leukocytosis. Considering CT for etiology of infection if troponin negative Otherwise labs unremarkable. No elevated lipase. Resp panel negative. UA contaminated but not grossly infected No complaints of chest pain but with atypical symptoms in women as well as vomiting, will obtain cardiac workup to r/o ACS as etiology No complaints of visual disturbances, headache, nor dizziness. Low suspicion for vertigo, CVA/TIA    Reevaluation:  After the interventions noted above, I reevaluated  the patient and found that they have :{resolved/improved/worsened:23923::improved}   Social Determinants of Health:  ***   Dispostion:  After consideration of the diagnostic results and the patients response to treatment, I feel that the patent would benefit from ***.    {Document critical care time when appropriate  Document review of labs and clinical decision tools ie CHADS2VASC2, etc  Document your independent review of radiology images and any outside records  Document your discussion with family members, caretakers and with consultants  Document social determinants of health affecting pt's care  Document your decision making why or why not admission, treatments were needed:32947:::1}   Final diagnoses:  None    ED Discharge Orders     None

## 2024-10-17 NOTE — Progress Notes (Signed)
  Echocardiogram 2D Echocardiogram has been performed.  Maria Foster, RDCS 10/17/2024, 3:22 PM

## 2024-10-17 NOTE — Plan of Care (Signed)
  Problem: Education: Goal: Knowledge of General Education information will improve Description: Including pain rating scale, medication(s)/side effects and non-pharmacologic comfort measures Outcome: Progressing   Problem: Activity: Goal: Risk for activity intolerance will decrease Outcome: Progressing   Problem: Nutrition: Goal: Adequate nutrition will be maintained Outcome: Progressing   Problem: Pain Managment: Goal: General experience of comfort will improve and/or be controlled Outcome: Progressing   Problem: Skin Integrity: Goal: Risk for impaired skin integrity will decrease Outcome: Progressing

## 2024-10-17 NOTE — H&P (Signed)
 History and Physical    Maria Foster FMW:[id number] DOB: 07/23/1956 DOA: 10/16/2024  PCP: Bulah Alm RAMAN, PA-C   Patient coming from: Home   Chief Complaint: Nausea, vomiting, fatigue   HPI: Maria Foster is a 68 y.o. female with medical history significant for hypertension, type 2 diabetes mellitus, OSA with CPAP intolerance, rheumatoid arthritis, CAD, CKD 3A, and chronic CHF with improved EF who presents with nausea, vomiting, and fatigue.  Patient reports that she enjoyed Thanksgiving meal on 10/15/2024, felt well initially when she woke the following day, but then developed nausea, fatigue, and recurrent bouts of vomiting shortly after she got up to use the bathroom.  She continued to experience nausea, vomiting, and fatigue throughout the day and eventually sought evaluation in the ED.  She denies any recent chest discomfort, abdominal pain, diarrhea, dysuria, or cough.  She denies any recent leg swelling.  [facility] ED Course: Upon arrival to the ED, patient is found to be afebrile and saturating in the mid 80s on room air with mild tachypnea, tachycardia, and elevated blood pressure.  Labs are most notable for glucose 263, creatinine 1.61, WBC 12,400, lactic acid 2.2, proBNP 4214, and troponin 175 which increased to 274.  CTA chest is negative for PE but concerning for worsening pulmonary edema on background of emphysema, less likely multifocal pneumonia.  Blood cultures were collected in the ED, 1 L of LR, linezolid, and cefepime  were administered initially, and she was later given IV Lasix , 325 mg aspirin , and was started on IV heparin  infusion.  She was transferred to [facility] for admission.  Review of Systems:  All other systems reviewed and apart from HPI, are negative.  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   OU progressive, will medically need to have cataract surgery  with lens will implantation so as to allow for complete clearance of the vitreous cavity in anticipation of likely vitrectomy in the future for progressive PDR despite previous PRP as delineated by the E DRS series of reports   Diabetes mellitus type 2 with complications (HCC)    diagnosed probably 2012 per patient   Diabetic retinopathy (HCC)    Hypertension    Rheumatoid arthritis (HCC)    Septic olecranon bursitis of right elbow 07/11/2021    Past Surgical History:  Procedure Laterality Date   INCISION AND DRAINAGE ABSCESS N/A 07/11/2021   Procedure: INCISION AND DRAINAGE RIGHT ELBOW;  Surgeon: Ernie Cough, MD;  Location: [facility] ORS;  Service: Orthopedics;  Laterality: N/A;   PRP Bilateral OD - 07/23/2017 OS - 08/06/2017   RIGHT/LEFT HEART CATH AND CORONARY ANGIOGRAPHY N/A 01/05/2021   Procedure: RIGHT/LEFT HEART CATH AND CORONARY ANGIOGRAPHY;  Surgeon: Cherrie Toribio JONELLE, MD;  Location: [facility] INVASIVE CV LAB;  Service: Cardiovascular;  Laterality: N/A;   TEE WITHOUT CARDIOVERSION N/A 10/09/2021   Procedure: TRANSESOPHAGEAL ECHOCARDIOGRAM (TEE);  Surgeon: Cherrie Toribio JONELLE, MD;  Location: [facility] ENDOSCOPY;  Service: Cardiovascular;  Laterality: N/A;    Social History:   reports that she quit smoking about 2 years ago. Her smoking use included cigarettes. She started smoking about 17 years ago. She has a 7.5 pack-year smoking history. She has been exposed to tobacco smoke. She has never used smokeless tobacco. She reports that she does not currently use alcohol. She reports that she does not use drugs.  Allergies  Allergen Reactions   Metformin  And Related Other (See  Comments)    Kidney injury, GI upset, diarrhea   Amoxicillin  Diarrhea   Farxiga  [Dapagliflozin ] Other (See Comments)    Yeast infections, recurrent    Family History  Problem Relation Age of Onset   Heart attack Mother    Heart failure Mother    Diabetes Mother    Hypertension Mother    Heart failure Sister    Heart  Problems Sister    Heart failure Sister    Hypertension Daughter    Amblyopia Neg Hx    Blindness Neg Hx    Cataracts Neg Hx    Glaucoma Neg Hx    Macular degeneration Neg Hx    Retinal detachment Neg Hx    Strabismus Neg Hx    Retinitis pigmentosa Neg Hx      Prior to Admission medications   Medication Sig Start Date End Date Taking? Authorizing Provider  aspirin  EC 81 MG tablet Take 1 tablet (81 mg total) by mouth daily. 07/09/24   Tysinger, Alm RAMAN, PA-C  atorvastatin  (LIPITOR) 40 MG tablet Take 1 tablet (40 mg total) by mouth daily. 02/25/24   Vannie Reche RAMAN, NP  carvedilol  (COREG ) 12.5 MG tablet Take 1 tablet (12.5 mg total) by mouth 2 (two) times daily. 07/09/24   Tysinger, Alm RAMAN, PA-C  ENTRESTO  97-103 MG TAKE 1 TABLET BY MOUTH TWICE DAILY 06/04/24   Walker, Caitlin S, NP  ferrous gluconate  (FERGON) 324 MG tablet Take 1 tablet (324 mg total) by mouth 2 (two) times daily with a meal. 07/09/24   Tysinger, Alm RAMAN, PA-C  furosemide  (LASIX ) 40 MG tablet Take 1 tablet (40 mg total) by mouth daily. Take extra dose for weight gain of 3lb in 1dy or 5lb in 1 week 07/05/24   Fairy Frames, MD  glipiZIDE  (GLUCOTROL ) 5 MG tablet Take 1 tablet (5 mg total) by mouth 2 (two) times daily before a meal. 07/09/24   Tysinger, Alm RAMAN, PA-C  hydrALAZINE  (APRESOLINE ) 100 MG tablet Take 1 tablet (100 mg total) by mouth 3 (three) times daily. 02/25/24   Walker, Caitlin S, NP  isosorbide  mononitrate (IMDUR ) 60 MG 24 hr tablet Take 1 tablet (60 mg total) by mouth daily. 02/25/24   Walker, Caitlin S, NP  latanoprost (XALATAN) 0.005 % ophthalmic solution Place 1 drop into both eyes at bedtime.    [provider]  leflunomide  (ARAVA ) 20 MG tablet Take 1 tablet (20 mg total) by mouth daily. 10/12/24   Jeannetta Lonni ORN, MD  linagliptin  (TRADJENTA ) 5 MG TABS tablet Take 1 tablet (5 mg total) by mouth daily. 07/09/24   Tysinger, Alm RAMAN, PA-C  methocarbamol  (ROBAXIN ) 500 MG tablet Take 1 tablet (500 mg  total) by mouth at bedtime as needed for muscle spasms. 07/08/24   Rice, Lonni ORN, MD  potassium chloride  SA (KLOR-CON  M) 20 MEQ tablet Take 1 tablet (20 mEq total) by mouth 2 (two) times daily. 07/09/24   Tysinger, Alm RAMAN, PA-C  spironolactone  (ALDACTONE ) 50 MG tablet Take 1 tablet (50 mg total) by mouth daily. 03/17/24   Raford Riggs, MD  Vitamin D , Ergocalciferol , (DRISDOL ) 1.25 MG (50000 UNIT) CAPS capsule Take 1 capsule (50,000 Units total) by mouth every 7 (seven) days. 07/27/24   Bulah Alm RAMAN, PA-C    Physical Exam: Vitals:   10/17/24 0030 10/17/24 0048 10/17/24 0515 10/17/24 0516  BP: (!) 140/70   (!) 147/81  Pulse: 94   93  Resp: 20   19  Temp:  97.8 F (36.6 C)  (!)  97.5 F (36.4 C)  TempSrc:  Oral Oral Oral  SpO2: 100%   92%  Weight:   71.4 kg     Constitutional: NAD, calm  Eyes: PERTLA, lids and conjunctivae normal ENMT: Mucous membranes are moist. Posterior pharynx clear of any exudate or lesions.   Neck: supple, no masses  Respiratory: Speaking in full sentences. Fine rales bilaterally.  Cardiovascular: S1 & S2 heard, regular rate and rhythm. No extremity edema. JVD. Abdomen: No tenderness, soft. Bowel sounds active.  Musculoskeletal: no clubbing / cyanosis. No joint deformity upper and lower extremities.   Skin: no significant rashes, lesions, ulcers. Warm, dry, well-perfused. Neurologic: CN 2-12 grossly intact. Moving all extremities. Alert and oriented.  Psychiatric: Pleasant. Cooperative.    Labs and Imaging on Admission: I have personally reviewed following labs and imaging studies  CBC: Recent Labs  Lab 10/12/24 1551 10/16/24 1931 10/17/24 0046  WBC 7.3 12.4*  --   NEUTROABS 5,190  --   --   HGB 12.5 12.6 13.3  HCT 40.5 39.1 39.0  MCV 91.4 89.9  --   PLT 194 186  --    Basic Metabolic Panel: Recent Labs  Lab 10/12/24 1551 10/16/24 1931 10/17/24 0046 10/17/24 0256  NA 139 140 140 136  K 4.2 4.2 4.1 4.3  CL 107 102  --  101  CO2  24 21*  --  22  GLUCOSE 195* 263*  --  287*  BUN 27* 36*  --  36*  CREATININE 1.31* 1.61*  --  1.39*  CALCIUM  9.5 10.0  --  9.4   GFR: Estimated Creatinine Clearance: 36.7 mL/min (A) (by C-G formula based on SCr of 1.39 mg/dL (H)). Liver Function Tests: Recent Labs  Lab 10/12/24 1551 10/16/24 1931  AST 13 17  ALT 10 9  ALKPHOS  --  142*  BILITOT 0.4 0.6  PROT 7.5 8.4*  ALBUMIN  --  4.4   Recent Labs  Lab 10/16/24 1931  LIPASE 36   No results for input(s): AMMONIA in the last 168 hours. Coagulation Profile: No results for input(s): INR, PROTIME in the last 168 hours. Cardiac Enzymes: No results for input(s): CKTOTAL, CKMB, CKMBINDEX, TROPONINI in the last 168 hours. BNP (last 3 results) Recent Labs    07/03/24 0118 10/16/24 2338  PROBNP 5,847.0* 4,214.0*   HbA1C: No results for input(s): HGBA1C in the last 72 hours. CBG: Recent Labs  Lab 10/17/24 0213  GLUCAP 289*   Lipid Profile: No results for input(s): CHOL, HDL, LDLCALC, TRIG, CHOLHDL, LDLDIRECT in the last 72 hours. Thyroid  Function Tests: No results for input(s): TSH, T4TOTAL, FREET4, T3FREE, THYROIDAB in the last 72 hours. Anemia Panel: No results for input(s): VITAMINB12, FOLATE, FERRITIN, TIBC, IRON , RETICCTPCT in the last 72 hours. Urine analysis:    Component Value Date/Time   COLORURINE YELLOW 10/16/2024 1931   APPEARANCEUR HAZY (A) 10/16/2024 1931   LABSPEC 1.016 10/16/2024 1931   LABSPEC 1.010 11/09/2022 1211   PHURINE 5.0 10/16/2024 1931   GLUCOSEU 500 (A) 10/16/2024 1931   HGBUR TRACE (A) 10/16/2024 1931   BILIRUBINUR NEGATIVE 10/16/2024 1931   BILIRUBINUR negative 05/11/2024 1522   KETONESUR 15 (A) 10/16/2024 1931   PROTEINUR 30 (A) 10/16/2024 1931   UROBILINOGEN 0.2 05/11/2024 1522   UROBILINOGEN 1.0 10/17/2009 1021   NITRITE NEGATIVE 10/16/2024 1931   LEUKOCYTESUR MODERATE (A) 10/16/2024 1931   Sepsis  Labs: @LABRCNTIP (procalcitonin:4,lacticidven:4) ) Recent Results (from the past 240 hours)  Resp panel by RT-PCR (RSV, Flu A&B, Covid)  Anterior Nasal Swab     Status: None   Collection Time: 10/16/24  7:31 PM   Specimen: Anterior Nasal Swab  Result Value Ref Range Status   SARS Coronavirus 2 by RT PCR NEGATIVE NEGATIVE Final    Comment: (NOTE) SARS-CoV-2 target nucleic acids are NOT DETECTED.  The SARS-CoV-2 RNA is generally detectable in upper respiratory specimens during the acute phase of infection. The lowest concentration of SARS-CoV-2 viral copies this assay can detect is 138 copies/mL. A negative result does not preclude SARS-Cov-2 infection and should not be used as the sole basis for treatment or other patient management decisions. A negative result may occur with  improper specimen collection/handling, submission of specimen other than nasopharyngeal swab, presence of viral mutation(s) within the areas targeted by this assay, and inadequate number of viral copies(<138 copies/mL). A negative result must be combined with clinical observations, patient history, and epidemiological information. The expected result is Negative.  Fact Sheet for Patients:  bloggercourse.com  Fact Sheet for Healthcare Providers:  seriousbroker.it  This test is no t yet approved or cleared by the [place]  FDA and  has been authorized for detection and/or diagnosis of SARS-CoV-2 by FDA under an Emergency Use Authorization (EUA). This EUA will remain  in effect (meaning this test can be used) for the duration of the COVID-19 declaration under Section 564(b)(1) of the Act, 21 U.S.C.section 360bbb-3(b)(1), unless the authorization is terminated  or revoked sooner.       Influenza A by PCR NEGATIVE NEGATIVE Final   Influenza B by PCR NEGATIVE NEGATIVE Final    Comment: (NOTE) The Xpert Xpress SARS-CoV-2/FLU/RSV plus assay is intended as an  aid in the diagnosis of influenza from Nasopharyngeal swab specimens and should not be used as a sole basis for treatment. Nasal washings and aspirates are unacceptable for Xpert Xpress SARS-CoV-2/FLU/RSV testing.  Fact Sheet for Patients: bloggercourse.com  Fact Sheet for Healthcare Providers: seriousbroker.it  This test is not yet approved or cleared by the [place]  FDA and has been authorized for detection and/or diagnosis of SARS-CoV-2 by FDA under an Emergency Use Authorization (EUA). This EUA will remain in effect (meaning this test can be used) for the duration of the COVID-19 declaration under Section 564(b)(1) of the Act, 21 U.S.C. section 360bbb-3(b)(1), unless the authorization is terminated or revoked.     Resp Syncytial Virus by PCR NEGATIVE NEGATIVE Final    Comment: (NOTE) Fact Sheet for Patients: bloggercourse.com  Fact Sheet for Healthcare Providers: seriousbroker.it  This test is not yet approved or cleared by the [place]  FDA and has been authorized for detection and/or diagnosis of SARS-CoV-2 by FDA under an Emergency Use Authorization (EUA). This EUA will remain in effect (meaning this test can be used) for the duration of the COVID-19 declaration under Section 564(b)(1) of the Act, 21 U.S.C. section 360bbb-3(b)(1), unless the authorization is terminated or revoked.  Performed at [facility], [address], [place], [place] [address]      Radiological Exams on Admission: CT Angio Chest PE W and/or Wo Contrast Result Date: 10/17/2024 EXAM: CTA CHEST 10/17/2024 01:12:53 AM TECHNIQUE: CTA of the chest was performed without and with the administration of 75 mL of iohexol  (OMNIPAQUE ) 350 MG/ML injection. Multiplanar reformatted images are provided for review. MIP images are provided for review. Automated exposure control,  iterative reconstruction, and/or weight based adjustment of the mA/kV was utilized to reduce the radiation dose to as low as reasonably achievable. COMPARISON: Comparison with 07/03/2024. CLINICAL  HISTORY: Pulmonary embolism (PE) suspected, high prob. FINDINGS: PULMONARY ARTERIES: Pulmonary arteries are adequately opacified for evaluation. No acute pulmonary embolus. Main pulmonary artery is normal in caliber. MEDIASTINUM: Coronary artery atherosclerotic calcification. Aortic atherosclerotic calcification. There is no acute abnormality of the thoracic aorta. The pericardium demonstrates no acute abnormality. LYMPH NODES: No mediastinal, hilar or axillary lymphadenopathy. LUNGS AND PLEURA: Diffuse bronchial wall thickening and interlobular septal thickening in both lungs greater on the right. Patchy nodular ground glass opacities in the right middle lobe with more confluent consolidation in the right lower lobe. Ground glass opacities in the left lower lobe. Findings have progressed compared to 07/03/2024. Small right pleural effusion. Similar to prior. No pneumothorax. UPPER ABDOMEN: Limited images of the upper abdomen are unremarkable. SOFT TISSUES AND BONES: Fusion of anterior osteophytes in the mid and lower thoracic spine. No acute soft tissue abnormality. IMPRESSION: 1. No pulmonary embolism. 2. Findings favor worsening pulmonary edema superimposed on background of emphysema when compared with 8 / 15 / 25. Multifocal pneumonia could appear similarly. 3. Small right pleural effusion, similar to prior. Electronically signed by: Norman Gatlin MD 10/17/2024 01:25 AM EST RP Workstation: [id number]   DG Chest Portable 1 View Result Date: 10/16/2024 CLINICAL DATA:  Chest pain EXAM: PORTABLE CHEST 1 VIEW COMPARISON:  Chest x-ray 07/04/2024.  CT of the chest 07/03/2024. FINDINGS: There are diffuse widespread interstitial opacities throughout both lungs, right greater than left. There are additional mild patchy  airspace opacities scattered throughout the right lung. The costophrenic angles are clear. The heart is mildly enlarged, unchanged. No pneumothorax or acute fracture. IMPRESSION: Diffuse widespread interstitial opacities throughout both lungs, right greater than left. There are additional mild patchy airspace opacities scattered throughout the right lung. Findings are concerning for multifocal pneumonia. Edema is not excluded. Electronically Signed   By: Greig Pique M.D.   On: 10/16/2024 23:54    EKG: Independently reviewed. Sinus rhythm, LAFB.   Assessment/Plan   1. 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 Elevated troponin   - Troponin went from 175 to 225 to 274 in ED  - No acute ischemic findings on EKG  - NSTEMI, possibly type II MI in setting of acute CHF  - She was given ASA 325 mg and started on IV heparin  in the ED  - Continue cardiac monitoring, repeat troponin, check echocardiogram, continue IV heparin  for now, continue Lipitor, ASA 81, and Coreg    3. SIRS  - WBC, HR, and RR elevated in ED  - Blood cultures were collected in ED and she was treated with broad-spectrum antibiotics  - She has bacteriuria and pulmonary opacities on imaging but has been afebrile and denies urinary symptoms or cough - Check procalcitonin, follow cultures and clinical course, hold further antibiotics for now   4. CKD 3A  - SCr is 1.39, appears close to baseline  - Renally-dose medications, monitor closely while diuresing    5. Type II DM  - A1c was 6.9% in September 2025  - Check CBGs and use low-intensity SSI for now    6. HTN  - Continue Coreg  and hydralazine  as tolerated     DVT prophylaxis: IV heparin   Code Status: Full  Level of Care: Level of care:  Progressive Family Communication: None present  Disposition Plan:  Patient is from: Home  Anticipated d/c is to: TBD Anticipated d/c date is: 10/20/24 Patient currently: Pending improved respiratory status, echocardiogram Consults called: None  Admission status: Inpatient     Evalene GORMAN Sprinkles, MD [facility]  10/17/2024, 6:04 AM

## 2024-10-17 NOTE — ED Notes (Signed)
 PT report attempted for 4th time and floor nurse Tisha RN advised room needed to be zapped per [facility] instructions. Tisha RN was informed that Pt was in transport via [facility].

## 2024-10-17 NOTE — Plan of Care (Signed)
 Plan of Care Note for accepted transfer   Patient name: Maria Foster FMW:[id number] DOB: 06-13-1956  Facility requesting transfer: [facility] Requesting Provider: Dr. Jerrol Facility course: 68 year old female with history of HFpEF (EF 70 to 75% on echo 07/04/2024), type 2 diabetes, OSA not compliant with CPAP, hypertension, hyperlipidemia, RA, prolonged QT, CKD stage IIIa presented to ED with complaints of vomiting and generalized weakness.  Tachycardic and slightly tachypneic.  Desatted to mid 80s on room air and initially required nonrebreather but now satting well on 4 L [place].  EKG without STEMI.  No chest pain or shortness of breath,. Afebrile.  Not hypotensive.  WBC count 12.4, bicarb 21, anion gap 17, glucose 263, BUN 36, creatinine 1.61 (previously 1.0-1.3), alk phos 142, transaminases and T. bili normal, lipase normal, COVID/influenza/RSV PCR negative, lactate 2.2> 2.2, troponin 175> 225, proBNP 4214, VBG with pH 7.27 and pCO2 52.4.  UA with moderate leukocytes, 21-50 WBCs, and many bacteria.  CTA chest negative for PE but showing findings favoring pulmonary edema versus multifocal pneumonia.  Patient was given morphine , Zofran , cefepime , and linezolid.  She was initially given 1 L IV fluids in the ED due to concern for sepsis but EDP will now give a dose of IV Lasix  due to concern for CHF exacerbation.  CBG remains elevated at 289 and beta hydroxybutyric acid level will be checked.  Plan of care: The patient is accepted for admission to Progressive unit at [facility].  [facility] will assume care on arrival to accepting facility. Until arrival, care as per EDP. However, [facility] available 24/7 for questions and assistance.  Check [email or web address] for on-call coverage.  Nursing staff, please call [facility] Admits & Consults System-Wide number under Amion on patient's arrival so appropriate admitting provider can evaluate the pt.

## 2024-10-17 NOTE — ED Notes (Signed)
 Shawn Rn with [facility] given PT report and informed room could possibly need to be zapped before Pt arrival. Elouise Rn stated  bed is showing ready and we are loaded, if the bed isn't ready when we arrive that's their problem. Charge nurse Thedora notified of same,

## 2024-10-17 NOTE — ED Notes (Signed)
-  Called [facility] to inform bed changed to 4E21.

## 2024-10-17 NOTE — Progress Notes (Signed)
 Pt arrived [facility], [facility],  call bell within reach, provider notified of pt's admission. Abrasion to left distal posterior ring finger

## 2024-10-17 NOTE — Progress Notes (Addendum)
 Patient seen and examined, admitted earlier this morning by Dr. Charlton, briefly 68/F with history of type 2 diabetes mellitus, hypertension, OSA intolerant to CPAP, RA, CKD 3 AA, chronic systolic CHF with improved EF presented to the ED with nausea vomiting and fatigue Friday morning, she was in her usual state of health Thursday 11/27 enjoyed her Thanksgiving dinner, woke up the following day with nausea had multiple episodes of vomiting thereafter, continued to experience intermittent nausea and vomiting through the day, eventually presented to the ER, initially concern for sepsis based on lactic acidosis given fluid boluses and then developed hypoxic respiratory failure, placed on a nonrebreather mask and then weaned off to 3 L O2. - WBC 12, lactate 2.2 improved, proBNP 4212, troponin 175, 274, CTA chest negative for PE, concerning for pulmonary edema in the background of emphysema, less likely multifocal pneumonia -In the ED given LR, linezolid , cefepime  followed by Lasix  and heparin   Acute on chronic systolic CHF, improved EF Acute hypoxic respiratory failure -Last echo in 8/25 with preserved EF, normal RV -Suspect fluid overload likely triggered by IV fluid bolus in the ER -Follow-up repeat echo, continue IV Lasix  today, resumed Entresto  at lower dose, Coreg  and Aldactone  -Wean O2  Elevated troponin -175 up to 275  -suspect this is demand ischemia -Follow-up 2D echo, continue aspirin  Coreg  and Lipitor  Nausea and vomiting -Yesterday, resolved, monitor  Lactic acidosis - Resolved, likely secondary to above, clinically do not suspect sepsis, stable and improving without antibiotics  Rest as noted by Dr. Charlton this morning  Sigurd Pac, MD

## 2024-10-17 NOTE — Progress Notes (Addendum)
 PHARMACY - ANTICOAGULATION CONSULT NOTE  Pharmacy Consult for Heparin  Indication: chest pain/ACS  Allergies  Allergen Reactions   Metformin  And Related Other (See Comments)    Kidney injury, GI upset, diarrhea   Amoxicillin  Diarrhea   Farxiga  [Dapagliflozin ] Other (See Comments)    Yeast infections, recurrent    Patient Measurements: Weight: 71.4 kg (157 lb 6.5 oz)  Vital Signs: Temp: 97.5 F (36.4 C) (11/29 1553) Temp Source: Oral (11/29 1553) BP: 162/91 (11/29 1553) Pulse Rate: 86 (11/29 1553)  Labs: Recent Labs    10/16/24 1931 10/17/24 0046 10/17/24 0256 10/17/24 0858  HGB 12.6 13.3  --  12.0  HCT 39.1 39.0  --  37.8  PLT 186  --   --  181  CREATININE 1.61*  --  1.39*  --     Estimated Creatinine Clearance: 36.7 mL/min (A) (by C-G formula based on SCr of 1.39 mg/dL (H)).   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   OU progressive, will medically need to have cataract surgery with lens will implantation so as to allow for complete clearance of the vitreous cavity in anticipation of likely vitrectomy in the future for progressive PDR despite previous PRP as delineated by the E DRS series of reports   Diabetes mellitus type 2 with complications (HCC)    diagnosed probably 2012 per patient   Diabetic retinopathy (HCC)    Hypertension    Rheumatoid arthritis (HCC)    Septic olecranon bursitis of right elbow 07/11/2021    Medications:  Medications Prior to Admission  Medication Sig Dispense Refill Last Dose/Taking   aspirin  EC 81 MG tablet Take 1 tablet (81 mg total) by mouth daily. 90 tablet 3    atorvastatin  (LIPITOR) 40 MG tablet Take 1 tablet (40 mg total) by mouth daily. 90 tablet 3    carvedilol  (COREG ) 12.5 MG tablet Take 1 tablet (12.5 mg total) by mouth 2 (two) times daily. 180 tablet 1    ENTRESTO  97-103 MG TAKE 1 TABLET BY MOUTH TWICE DAILY 60 tablet 6     ferrous gluconate  (FERGON) 324 MG tablet Take 1 tablet (324 mg total) by mouth 2 (two) times daily with a meal. 180 tablet 1    furosemide  (LASIX ) 40 MG tablet Take 1 tablet (40 mg total) by mouth daily. Take extra dose for weight gain of 3lb in 1dy or 5lb in 1 week 90 tablet 3    glipiZIDE  (GLUCOTROL ) 5 MG tablet Take 1 tablet (5 mg total) by mouth 2 (two) times daily before a meal. 60 tablet 2    hydrALAZINE  (APRESOLINE ) 100 MG tablet Take 1 tablet (100 mg total) by mouth 3 (three) times daily. 270 tablet 3    isosorbide  mononitrate (IMDUR ) 60 MG 24 hr tablet Take 1 tablet (60 mg total) by mouth daily. 30 tablet 11    latanoprost (XALATAN) 0.005 % ophthalmic solution Place 1 drop into both eyes at bedtime.      leflunomide  (ARAVA ) 20 MG tablet Take 1 tablet (20 mg total) by mouth daily. 90 tablet 0    linagliptin  (TRADJENTA ) 5 MG TABS tablet Take 1 tablet (5 mg total) by mouth daily. 90 tablet 1    methocarbamol  (ROBAXIN ) 500 MG tablet Take 1 tablet (500 mg total) by mouth at bedtime as needed for muscle spasms. 30 tablet 0    potassium chloride  SA (KLOR-CON  M) 20 MEQ tablet  Take 1 tablet (20 mEq total) by mouth 2 (two) times daily. 180 tablet 1    spironolactone  (ALDACTONE ) 50 MG tablet Take 1 tablet (50 mg total) by mouth daily. 60 tablet 3    Vitamin D , Ergocalciferol , (DRISDOL ) 1.25 MG (50000 UNIT) CAPS capsule Take 1 capsule (50,000 Units total) by mouth every 7 (seven) days. 12 capsule 2     Assessment: 68 y.o. female admitted with CHF, elevated troponin and possible ACS, for heparin .  Heparin  started earlier today then D/C'd, now to resume with ECHO showing WMAs.  Goal of Therapy:  Heparin  level 0.3-0.7 units/ml Monitor platelets by anticoagulation protocol: Yes   Plan:  Resume heparin  850 units/h with 2000 unit bolus Check heparin  level in 6h  Ozell Jamaica, PharmD, [place], [facility] Clinical Pharmacist [phone] Please check AMION for all [facility] Pharmacy numbers 10/17/2024

## 2024-10-17 NOTE — Progress Notes (Signed)
 ED Pharmacy Antibiotic Sign Off An antibiotic consult was received from an ED provider for Cefepime  per pharmacy dosing for UTI/PNA. A chart review was completed to assess appropriateness.   The following one time order(s) were placed:  Cefepime  2g IV x 1 Also given Zyvox  600 mg IV per EDP   Further antibiotic and/or antibiotic pharmacy consults should be ordered by the admitting provider if indicated.   Lynwood Mckusick, PharmD, BCPS Clinical Pharmacist Phone: [phone]

## 2024-10-18 ENCOUNTER — Inpatient Hospital Stay (HOSPITAL_COMMUNITY)

## 2024-10-18 DIAGNOSIS — I5021 Acute systolic (congestive) heart failure: Secondary | ICD-10-CM

## 2024-10-18 DIAGNOSIS — N179 Acute kidney failure, unspecified: Secondary | ICD-10-CM | POA: Diagnosis not present

## 2024-10-18 DIAGNOSIS — I214 Non-ST elevation (NSTEMI) myocardial infarction: Secondary | ICD-10-CM | POA: Diagnosis not present

## 2024-10-18 DIAGNOSIS — I5033 Acute on chronic diastolic (congestive) heart failure: Secondary | ICD-10-CM | POA: Diagnosis not present

## 2024-10-18 DIAGNOSIS — I739 Peripheral vascular disease, unspecified: Secondary | ICD-10-CM

## 2024-10-18 LAB — COMPREHENSIVE METABOLIC PANEL WITH GFR
ALT: 13 U/L (ref 0–44)
AST: 21 U/L (ref 15–41)
Albumin: 3.2 g/dL — ABNORMAL LOW (ref 3.5–5.0)
Alkaline Phosphatase: 92 U/L (ref 38–126)
Anion gap: 13 (ref 5–15)
BUN: 53 mg/dL — ABNORMAL HIGH (ref 8–23)
CO2: 21 mmol/L — ABNORMAL LOW (ref 22–32)
Calcium: 8.4 mg/dL — ABNORMAL LOW (ref 8.9–10.3)
Chloride: 96 mmol/L — ABNORMAL LOW (ref 98–111)
Creatinine, Ser: 2.49 mg/dL — ABNORMAL HIGH (ref 0.44–1.00)
GFR, Estimated: 21 mL/min — ABNORMAL LOW (ref >60)
Glucose, Bld: 241 mg/dL — ABNORMAL HIGH (ref 70–99)
Potassium: 4 mmol/L (ref 3.5–5.1)
Sodium: 130 mmol/L — ABNORMAL LOW (ref 135–145)
Total Bilirubin: 0.8 mg/dL (ref 0.0–1.2)
Total Protein: 7 g/dL (ref 6.5–8.1)

## 2024-10-18 LAB — CBC
HCT: 41.2 % (ref 36.0–46.0)
Hemoglobin: 13.1 g/dL (ref 12.0–15.0)
MCH: 28.7 pg (ref 26.0–34.0)
MCHC: 31.8 g/dL (ref 30.0–36.0)
MCV: 90.4 fL (ref 80.0–100.0)
Platelets: 190 K/uL (ref 150–400)
RBC: 4.56 MIL/uL (ref 3.87–5.11)
RDW: 13.2 % (ref 11.5–15.5)
WBC: 13.1 K/uL — ABNORMAL HIGH (ref 4.0–10.5)
nRBC: 0 % (ref 0.0–0.2)

## 2024-10-18 LAB — ECHOCARDIOGRAM LIMITED
Calc EF: 40.6 %
Est EF: 40
Single Plane A2C EF: 40.7 %
Single Plane A4C EF: 39.7 %
Weight: 2499.2 [oz_av]

## 2024-10-18 LAB — BASIC METABOLIC PANEL WITH GFR
Anion gap: 16 — ABNORMAL HIGH (ref 5–15)
BUN: 43 mg/dL — ABNORMAL HIGH (ref 8–23)
CO2: 18 mmol/L — ABNORMAL LOW (ref 22–32)
Calcium: 9.2 mg/dL (ref 8.9–10.3)
Chloride: 102 mmol/L (ref 98–111)
Creatinine, Ser: 2.1 mg/dL — ABNORMAL HIGH (ref 0.44–1.00)
GFR, Estimated: 25 mL/min — ABNORMAL LOW (ref >60)
Glucose, Bld: 198 mg/dL — ABNORMAL HIGH (ref 70–99)
Potassium: 4.6 mmol/L (ref 3.5–5.1)
Sodium: 136 mmol/L (ref 135–145)

## 2024-10-18 LAB — HEPARIN LEVEL (UNFRACTIONATED)
Heparin Unfractionated: 0.27 [IU]/mL — ABNORMAL LOW (ref 0.30–0.70)
Heparin Unfractionated: 0.2 [IU]/mL — ABNORMAL LOW (ref 0.30–0.70)
Heparin Unfractionated: 0.47 [IU]/mL (ref 0.30–0.70)

## 2024-10-18 LAB — GLUCOSE, CAPILLARY
Glucose-Capillary: 194 mg/dL — ABNORMAL HIGH (ref 70–99)
Glucose-Capillary: 188 mg/dL — ABNORMAL HIGH (ref 70–99)
Glucose-Capillary: 207 mg/dL — ABNORMAL HIGH (ref 70–99)
Glucose-Capillary: 240 mg/dL — ABNORMAL HIGH (ref 70–99)

## 2024-10-18 LAB — LIPASE, BLOOD: Lipase: 30 U/L (ref 11–51)

## 2024-10-18 MED ORDER — IPRATROPIUM-ALBUTEROL 0.5-2.5 (3) MG/3ML IN SOLN: 3.0000 mL | Freq: Four times a day (QID) | RESPIRATORY_TRACT | Status: DC | PRN

## 2024-10-18 MED ORDER — IPRATROPIUM-ALBUTEROL 0.5-2.5 (3) MG/3ML IN SOLN: 3.0000 mL | Freq: Four times a day (QID) | RESPIRATORY_TRACT | Status: DC

## 2024-10-18 MED ORDER — ASPIRIN 81 MG PO CHEW
81.0000 mg | CHEWABLE_TABLET | ORAL | Status: AC
  Administered 2024-10-19: 81 mg via ORAL
  Filled 2024-10-18: qty 1

## 2024-10-18 MED ORDER — PERFLUTREN LIPID MICROSPHERE
1.0000 mL | INTRAVENOUS | Status: AC | PRN
  Administered 2024-10-18: 4 mL via INTRAVENOUS

## 2024-10-18 MED ORDER — ENSURE PLUS HIGH PROTEIN PO LIQD
237.0000 mL | Freq: Two times a day (BID) | ORAL | Status: DC
  Administered 2024-10-18: 237 mL via ORAL

## 2024-10-18 MED ORDER — SODIUM CHLORIDE 0.9 % IV SOLN: INTRAVENOUS | Status: DC

## 2024-10-18 MED ORDER — HEPARIN BOLUS VIA INFUSION
1000.0000 [IU] | Freq: Once | INTRAVENOUS | Status: AC
  Administered 2024-10-18: 1000 [IU] via INTRAVENOUS
  Filled 2024-10-18: qty 1000

## 2024-10-18 NOTE — Plan of Care (Signed)

## 2024-10-18 NOTE — Progress Notes (Signed)
 Progress Note  Patient Name: Maria Foster Date of Encounter: 10/18/2024 [facility] Cardiologist: Annabella Scarce, MD   Interval Summary   Her abdominal discomfort, nausea and vomiting have all subsided.  She denies any chest pain or shortness of breath.  Vital Signs Vitals:   10/17/24 2335 10/18/24 0300 10/18/24 0622 10/18/24 0730  BP: 111/61 (!) 147/65  (!) 153/81  Pulse: 95 98  94  Resp: 20 20  19   Temp: 98.2 F (36.8 C) 98.3 F (36.8 C)  98.3 F (36.8 C)  TempSrc: Oral Oral  Oral  SpO2: 95% 97%  98%  Weight:   70.9 kg     Intake/Output Summary (Last 24 hours) at 10/18/2024 1118 Last data filed at 10/18/2024 0900 Gross per 24 hour  Intake 607.08 ml  Output --  Net 607.08 ml      10/18/2024    6:22 AM 10/17/2024    5:15 AM 10/12/2024    3:19 PM  Last 3 Weights  Weight (lbs) 156 lb 3.2 oz 157 lb 6.5 oz 160 lb 12.8 oz  Weight (kg) 70.852 kg 71.4 kg 72.938 kg      Telemetry/ECG  Almost entirely normal sinus rhythm.  She had 1 episode of wide-complex rhythm at 93 bpm, possible accelerated idioventricular rhythm at 5 PM yesterday- Personally Reviewed  Personally reviewed the 10/16/2024 ECG which shows normal sinus rhythm with left axis deviation/left anterior fascicular block and QS pattern in lead V2 with poor R wave progression in the precordial leads.  There appears to be very subtle, nonspecific ST segment elevation in lead I.    Physical Exam  GEN: No acute distress.   Neck: No JVD Cardiac: RRR, no murmurs, rubs, or gallops.  Respiratory: Clear to auscultation bilaterally. GI: Soft, nontender, non-distended .  She does not have any tenderness to the periumbilical area, right lower quadrant or right upper quadrant.  Negative Murphy sign. MS: No edema  Assessment & Plan  68 year old woman with severe multivessel CAD and history of HFrEF with normalized systolic function on medical therapy, returns with complaints of abdominal pain nausea and  vomiting and is found to have worsening left ventricular systolic function with regional wall motion abnormalities primarily involving the LAD distribution.  Additional medical problems include DM type II, HTN, OSA (not on CPAP), CKD 3 AA, PAD, rheumatoid arthritis.  NSTEMI: Differential diagnosis is Takotsubo syndrome versus progression of disease in the LAD artery leading to widespread coronary ischemia since the LAD also provides collaterals to other territories.  I believe an acute coronary syndrome is more likely.  Minimal increase in high-sensitivity troponin, initially appeared to be quite flat (around 200), but did show a significant rise roughly 18 hours after initial presentation (to around 700).  Episode of wide-complex tachycardia on the monitor yesterday afternoon could have represented reperfusion related accelerated idioventricular rhythm.  Currently on intravenous heparin .  Plan coronary angiography and possible revascularization tomorrow. AKI on CKD 3A: Note significant worsening of renal parameters today (creatinine 2.1 up from baseline around 1.3, expect it is due to hypovolemia in the setting of nausea and vomiting.  Will give IV fluids gently throughout the day today in anticipation of coronary angiogram, but need to monitor for development of heart failure.   CHF: Significant reduction in LVEF.  From a clinical point of view she is very well compensated and has no dyspnea, but on my review the echo parameters suggest pseudo normal mitral inflow with elevated mean left atrial pressure due  to LV dysfunction.  Also has mild-moderate MR and mild-moderate AR, neither 1 of which appears to be hemodynamically important at this point. Abdominal pain/nausea-vomiting:  Scheduled for an abdominal CT today due to her initial presentation with prominent GI symptoms.   Informed Consent   Shared Decision Making/Informed Consent The risks [stroke (1 in 1000), death (1 in 1000), kidney failure  [usually temporary] (1 in 500), bleeding (1 in 200), allergic reaction [possibly serious] (1 in 200)], benefits (diagnostic support and management of coronary artery disease) and alternatives of a cardiac catheterization were discussed in detail with Ms. Vrooman and she is willing to proceed.       For questions or updates, please contact [facility] Please consult [email or web address] for contact info under         Signed, Jerel Balding, MD

## 2024-10-18 NOTE — Progress Notes (Signed)
 PHARMACY - ANTICOAGULATION CONSULT NOTE  Pharmacy Consult for Heparin  Indication: chest pain/ACS  Allergies  Allergen Reactions   Metformin  And Related Other (See Comments)    Kidney injury, GI upset, diarrhea   Amoxicillin  Diarrhea   Farxiga  [Dapagliflozin ] Other (See Comments)    Yeast infections, recurrent    Patient Measurements: Weight: 70.9 kg (156 lb 3.2 oz)  Vital Signs: Temp: 98.6 F (37 C) (11/30 1614) Temp Source: Oral (11/30 1614) BP: 148/70 (11/30 1614) Pulse Rate: 96 (11/30 1614)  Labs: Recent Labs    10/16/24 1931 10/17/24 0046 10/17/24 0256 10/17/24 0858 10/17/24 1804 10/17/24 1931 10/18/24 0050 10/18/24 0234 10/18/24 1037 10/18/24 1912  HGB 12.6 13.3  --  12.0  --   --   --  13.1  --   --   HCT 39.1 39.0  --  37.8  --   --   --  41.2  --   --   PLT 186  --   --  181  --   --   --  190  --   --   HEPARINUNFRC  --   --   --   --   --   --  0.27*  --  0.20* 0.47  CREATININE 1.61*  --  1.39*  --   --   --   --  2.10*  --   --   TROPONINIHS  --   --   --   --  676* 703*  --   --   --   --     Estimated Creatinine Clearance: 24.2 mL/min (A) (by C-G formula based on SCr of 2.1 mg/dL (H)).   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   OU progressive, will medically need to have cataract surgery with lens will implantation so as to allow for complete clearance of the vitreous cavity in anticipation of likely vitrectomy in the future for progressive PDR despite previous PRP as delineated by the E DRS series of reports   Diabetes mellitus type 2 with complications (HCC)    diagnosed probably 2012 per patient   Diabetic retinopathy (HCC)    Hypertension    Rheumatoid arthritis (HCC)    Septic olecranon bursitis of right elbow 07/11/2021    Medications:  Medications Prior to Admission  Medication Sig Dispense Refill Last Dose/Taking   aspirin  EC 81 MG tablet  Take 1 tablet (81 mg total) by mouth daily. 90 tablet 3    atorvastatin  (LIPITOR) 40 MG tablet Take 1 tablet (40 mg total) by mouth daily. 90 tablet 3    carvedilol  (COREG ) 12.5 MG tablet Take 1 tablet (12.5 mg total) by mouth 2 (two) times daily. 180 tablet 1    ENTRESTO  97-103 MG TAKE 1 TABLET BY MOUTH TWICE DAILY 60 tablet 6    ferrous gluconate  (FERGON) 324 MG tablet Take 1 tablet (324 mg total) by mouth 2 (two) times daily with a meal. 180 tablet 1    furosemide  (LASIX ) 40 MG tablet Take 1 tablet (40 mg total) by mouth daily. Take extra dose for weight gain of 3lb in 1dy or 5lb in 1 week 90 tablet 3    glipiZIDE  (GLUCOTROL ) 5 MG tablet Take 1 tablet (5 mg total) by mouth 2 (two) times daily before a meal. 60 tablet 2    hydrALAZINE  (APRESOLINE ) 100 MG tablet Take 1 tablet (100 mg  total) by mouth 3 (three) times daily. 270 tablet 3    isosorbide  mononitrate (IMDUR ) 60 MG 24 hr tablet Take 1 tablet (60 mg total) by mouth daily. 30 tablet 11    latanoprost (XALATAN) 0.005 % ophthalmic solution Place 1 drop into both eyes at bedtime.      leflunomide  (ARAVA ) 20 MG tablet Take 1 tablet (20 mg total) by mouth daily. 90 tablet 0    linagliptin  (TRADJENTA ) 5 MG TABS tablet Take 1 tablet (5 mg total) by mouth daily. 90 tablet 1    methocarbamol  (ROBAXIN ) 500 MG tablet Take 1 tablet (500 mg total) by mouth at bedtime as needed for muscle spasms. 30 tablet 0    potassium chloride  SA (KLOR-CON  M) 20 MEQ tablet Take 1 tablet (20 mEq total) by mouth 2 (two) times daily. 180 tablet 1    spironolactone  (ALDACTONE ) 50 MG tablet Take 1 tablet (50 mg total) by mouth daily. 60 tablet 3    Vitamin D , Ergocalciferol , (DRISDOL ) 1.25 MG (50000 UNIT) CAPS capsule Take 1 capsule (50,000 Units total) by mouth every 7 (seven) days. 12 capsule 2     Assessment: 68 y.o. female admitted with CHF, elevated troponin and possible ACS, for heparin .  Heparin  started earlier in the day on 11/29 then D/C'd and restarted later  in the day of 11/29 due to ECHO showing WMAs.  11/30 PM: Heparin  level therapeutic at 0.47.   Goal of Therapy:  Heparin  level 0.3-0.7 units/ml Monitor platelets by anticoagulation protocol: Yes   Plan:  Continue heparin  infusion at 1150 units/hr  Check heparin  level with AM labs and daily while on heparin   Continue to monitor H&H and platelets   Thank you for allowing pharmacy to be a part of this patient's care   Larraine Brazier, PharmD Clinical Pharmacist 10/18/2024  7:51 PM **Pharmacist phone directory can now be found on [email or web address] ([email or web address]).  Listed under [facility].

## 2024-10-18 NOTE — Progress Notes (Signed)
  Echocardiogram 2D Echocardiogram has been performed.  [name] 10/18/2024, 2:31 PM

## 2024-10-18 NOTE — Progress Notes (Signed)
 [facility] Progress Note Patient: Maria Foster FMW:[id number] DOB: December 04, 1955  DOA: 10/16/2024 DOS: the patient was seen and examined on 10/18/2024  Brief Hospital Course:  68/F with history of type 2 diabetes mellitus, hypertension, OSA intolerant to CPAP, RA, CKD 3 AA, chronic systolic CHF with improved EF presented to the ED with nausea vomiting and fatigue Friday morning, she was in her usual state of health Thursday 11/27 enjoyed her Thanksgiving dinner, woke up the following day with nausea had multiple episodes of vomiting thereafter, continued to experience intermittent nausea and vomiting through the day, eventually presented to the ER, initially concern for sepsis based on lactic acidosis given fluid boluses and then developed hypoxic respiratory failure, placed on a nonrebreather mask and then weaned off to 3 L O2. - WBC 12, lactate 2.2 improved, proBNP 4212, troponin 175, 274, CTA chest negative for PE, concerning for pulmonary edema in the background of emphysema, less likely multifocal pneumonia -In the ED given LR, linezolid , cefepime  followed by Lasix  and heparin  Assessment and Plan: 1. Acute on chronic HFimpEF; acute hypoxic respiratory failure   - EF was 70-75% in August 2025, previously low   - Continue diuresis with IV Lasix , continue Entresto , Coreg , and Aldactone  as tolerated, monitor I/Os and daily weights, continue supplemental O2 as-needed     2. Elevated troponin   - Troponin went from 175 to 225 to 274 in ED  - No acute ischemic findings on EKG  - NSTEMI, possibly type II MI in setting of acute CHF  - She was given ASA 325 mg and started on IV heparin  in the ED, cardiology consult appreciated.   3. SIRS  - WBC, HR, and RR elevated in ED  - Blood cultures were collected in ED and she was treated with broad-spectrum antibiotics  - She has bacteriuria and pulmonary opacities on imaging but has been afebrile and denies urinary symptoms or cough   4.  AKI on CKD  3A  SCr is 1.39, appears close to baseline  Renally-dose worsened to 2.1. Monitor for now. CT scan negative for any obstruction.   5. Type II DM  A1c was 6.9% in September 2025  Continue sliding scale insulin .   6. HTN  Continue Coreg  and hydralazine  as tolerated   7.  Intractable nausea and vomiting. CT scan abdomen ordered. Report currently pending although clinically does not appear to be having any significant obstruction based on my evaluation.  Will await formal report.  Monitor.  Subjective: Ongoing nausea minimal oral intake.  No vomiting so far.  Also some abdominal discomfort but no tenderness.  Passing gas.  No BM.  No fever no chills.  Ongoing shortness of breath.  No chest pain present upon my evaluation but had some chest tightness when she lies down flat.  Physical Exam: Basal crackles. S1-S2 present Bowel sound present.  Diffusely tender. Trace lower extremity edema seen.  Data Reviewed: I have Reviewed nursing notes, Vitals, and Lab results. Since last encounter, pertinent lab results CBC and BMP   . I have ordered test including CBC and BMP  . I have discussed pt's care plan and test results with cardiology  .  Ordered CT abdomen and pelvis  Disposition: Status is: Inpatient Remains inpatient appropriate because: Monitor for improvement in abdominal symptoms, oral intake as well as volume status  Family Communication: Family at bedside Level of care: Progressive   Vitals:   10/18/24 0730 10/18/24 1134 10/18/24 1505 10/18/24 1614  BP: (!) 153/81 ROLLEN)  144/72  (!) 148/70  Pulse: 94 88 97 96  Resp: 19 20 16 15   Temp: 98.3 F (36.8 C) 98.3 F (36.8 C)  98.6 F (37 C)  TempSrc: Oral Oral  Oral  SpO2: 98% 97%  95%  Weight:         Author: Yetta Blanch, MD 10/18/2024 5:47 PM  Please look on [email or web address] to find out who is on call.

## 2024-10-18 NOTE — Progress Notes (Signed)
 PHARMACY - ANTICOAGULATION CONSULT NOTE  Pharmacy Consult for Heparin  Indication: chest pain/ACS  Allergies  Allergen Reactions   Metformin  And Related Other (See Comments)    Kidney injury, GI upset, diarrhea   Amoxicillin  Diarrhea   Farxiga  [Dapagliflozin ] Other (See Comments)    Yeast infections, recurrent    Patient Measurements: Weight: 70.9 kg (156 lb 3.2 oz)  Vital Signs: Temp: 98.3 F (36.8 C) (11/30 0730) Temp Source: Oral (11/30 0730) BP: 153/81 (11/30 0730) Pulse Rate: 94 (11/30 0730)  Labs: Recent Labs    10/16/24 1931 10/17/24 0046 10/17/24 0256 10/17/24 0858 10/17/24 1804 10/17/24 1931 10/18/24 0050 10/18/24 0234  HGB 12.6 13.3  --  12.0  --   --   --  13.1  HCT 39.1 39.0  --  37.8  --   --   --  41.2  PLT 186  --   --  181  --   --   --  190  HEPARINUNFRC  --   --   --   --   --   --  0.27*  --   CREATININE 1.61*  --  1.39*  --   --   --   --  2.10*  TROPONINIHS  --   --   --   --  676* 703*  --   --     Estimated Creatinine Clearance: 24.2 mL/min (A) (by C-G formula based on SCr of 2.1 mg/dL (H)).   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   OU progressive, will medically need to have cataract surgery with lens will implantation so as to allow for complete clearance of the vitreous cavity in anticipation of likely vitrectomy in the future for progressive PDR despite previous PRP as delineated by the E DRS series of reports   Diabetes mellitus type 2 with complications (HCC)    diagnosed probably 2012 per patient   Diabetic retinopathy (HCC)    Hypertension    Rheumatoid arthritis (HCC)    Septic olecranon bursitis of right elbow 07/11/2021    Medications:  Medications Prior to Admission  Medication Sig Dispense Refill Last Dose/Taking   aspirin  EC 81 MG tablet Take 1 tablet (81 mg total) by mouth daily. 90 tablet 3    atorvastatin  (LIPITOR) 40 MG  tablet Take 1 tablet (40 mg total) by mouth daily. 90 tablet 3    carvedilol  (COREG ) 12.5 MG tablet Take 1 tablet (12.5 mg total) by mouth 2 (two) times daily. 180 tablet 1    ENTRESTO  97-103 MG TAKE 1 TABLET BY MOUTH TWICE DAILY 60 tablet 6    ferrous gluconate  (FERGON) 324 MG tablet Take 1 tablet (324 mg total) by mouth 2 (two) times daily with a meal. 180 tablet 1    furosemide  (LASIX ) 40 MG tablet Take 1 tablet (40 mg total) by mouth daily. Take extra dose for weight gain of 3lb in 1dy or 5lb in 1 week 90 tablet 3    glipiZIDE  (GLUCOTROL ) 5 MG tablet Take 1 tablet (5 mg total) by mouth 2 (two) times daily before a meal. 60 tablet 2    hydrALAZINE  (APRESOLINE ) 100 MG tablet Take 1 tablet (100 mg total) by mouth 3 (three) times daily. 270 tablet 3    isosorbide  mononitrate (IMDUR ) 60 MG 24 hr tablet Take 1 tablet (60 mg total) by mouth daily. 30 tablet 11  latanoprost (XALATAN) 0.005 % ophthalmic solution Place 1 drop into both eyes at bedtime.      leflunomide  (ARAVA ) 20 MG tablet Take 1 tablet (20 mg total) by mouth daily. 90 tablet 0    linagliptin  (TRADJENTA ) 5 MG TABS tablet Take 1 tablet (5 mg total) by mouth daily. 90 tablet 1    methocarbamol  (ROBAXIN ) 500 MG tablet Take 1 tablet (500 mg total) by mouth at bedtime as needed for muscle spasms. 30 tablet 0    potassium chloride  SA (KLOR-CON  M) 20 MEQ tablet Take 1 tablet (20 mEq total) by mouth 2 (two) times daily. 180 tablet 1    spironolactone  (ALDACTONE ) 50 MG tablet Take 1 tablet (50 mg total) by mouth daily. 60 tablet 3    Vitamin D , Ergocalciferol , (DRISDOL ) 1.25 MG (50000 UNIT) CAPS capsule Take 1 capsule (50,000 Units total) by mouth every 7 (seven) days. 12 capsule 2     Assessment: 68 y.o. female admitted with CHF, elevated troponin and possible ACS, for heparin .  Heparin  started earlier in the day on 11/29 then D/C'd and restarted later in the day of 11/29 due to ECHO showing WMAs.  Heparin  level subtherapeutic at 0.20 on  1000 units/hr. Hgb 13.1 and PLT 190. Per RN, no issues with infusion and no signs of bleeding.   Goal of Therapy:  Heparin  level 0.3-0.7 units/ml Monitor platelets by anticoagulation protocol: Yes   Plan:  Give 1000 unit bolus Increase heparin  infusion at 1150 units/hr  Check heparin  level in 8 hours and daily while on heparin   Continue to monitor H&H and platelets   Thank you for allowing pharmacy to be a part of this patient's care   Prentice DOROTHA Favors, PharmD PGY1 Health-System Pharmacy Administration and Leadership Resident [facility]  10/18/2024 9:59 AM

## 2024-10-18 NOTE — Progress Notes (Signed)
 PHARMACY - ANTICOAGULATION  Pharmacy Consult for Heparin  Indication: chest pain/ACS Brief A/P: Heparin  level subtherapeutic Increase Heparin  rate  Allergies  Allergen Reactions   Metformin  And Related Other (See Comments)    Kidney injury, GI upset, diarrhea   Amoxicillin  Diarrhea   Farxiga  [Dapagliflozin ] Other (See Comments)    Yeast infections, recurrent    Patient Measurements: Weight: 71.4 kg (157 lb 6.5 oz)  Vital Signs: Temp: 98.2 F (36.8 C) (11/29 2335) Temp Source: Oral (11/29 2335) BP: 111/61 (11/29 2335) Pulse Rate: 95 (11/29 2335)  Labs: Recent Labs    10/16/24 1931 10/17/24 0046 10/17/24 0256 10/17/24 0858 10/17/24 1804 10/17/24 1931 10/18/24 0050  HGB 12.6 13.3  --  12.0  --   --   --   HCT 39.1 39.0  --  37.8  --   --   --   PLT 186  --   --  181  --   --   --   HEPARINUNFRC  --   --   --   --   --   --  0.27*  CREATININE 1.61*  --  1.39*  --   --   --   --   TROPONINIHS  --   --   --   --  676* 703*  --     Estimated Creatinine Clearance: 36.7 mL/min (A) (by C-G formula based on SCr of 1.39 mg/dL (H)).   Assessment: 68 y.o. female admitted with CHF, elevated troponin and possible ACS, for heparin   Goal of Therapy:  Heparin  level 0.3-0.7 units/ml Monitor platelets by anticoagulation protocol: Yes   Plan:  Increase Heparin  1000 units/hr  Maria Foster, Maria Foster 10/18/2024,1:23 AM

## 2024-10-19 ENCOUNTER — Other Ambulatory Visit (HOSPITAL_COMMUNITY): Payer: Self-pay

## 2024-10-19 ENCOUNTER — Telehealth (HOSPITAL_COMMUNITY): Payer: Self-pay | Admitting: Pharmacy Technician

## 2024-10-19 DIAGNOSIS — I5033 Acute on chronic diastolic (congestive) heart failure: Secondary | ICD-10-CM | POA: Diagnosis not present

## 2024-10-19 DIAGNOSIS — N1831 Chronic kidney disease, stage 3a: Secondary | ICD-10-CM | POA: Diagnosis not present

## 2024-10-19 DIAGNOSIS — I493 Ventricular premature depolarization: Secondary | ICD-10-CM

## 2024-10-19 DIAGNOSIS — I214 Non-ST elevation (NSTEMI) myocardial infarction: Secondary | ICD-10-CM | POA: Diagnosis not present

## 2024-10-19 LAB — BASIC METABOLIC PANEL WITH GFR
Anion gap: 10 (ref 5–15)
BUN: 58 mg/dL — ABNORMAL HIGH (ref 8–23)
CO2: 22 mmol/L (ref 22–32)
Calcium: 8.7 mg/dL — ABNORMAL LOW (ref 8.9–10.3)
Chloride: 98 mmol/L (ref 98–111)
Creatinine, Ser: 2.64 mg/dL — ABNORMAL HIGH (ref 0.44–1.00)
GFR, Estimated: 19 mL/min — ABNORMAL LOW (ref >60)
Glucose, Bld: 279 mg/dL — ABNORMAL HIGH (ref 70–99)
Potassium: 4.8 mmol/L (ref 3.5–5.1)
Sodium: 130 mmol/L — ABNORMAL LOW (ref 135–145)

## 2024-10-19 LAB — LIPID PANEL
Cholesterol: 115 mg/dL (ref 0–200)
HDL: 48 mg/dL (ref >40)
LDL Cholesterol: 55 mg/dL (ref 0–99)
Total CHOL/HDL Ratio: 2.4 ratio
Triglycerides: 61 mg/dL (ref <150)
VLDL: 12 mg/dL (ref 0–40)

## 2024-10-19 LAB — CBC
HCT: 41.5 % (ref 36.0–46.0)
Hemoglobin: 13.3 g/dL (ref 12.0–15.0)
MCH: 28.5 pg (ref 26.0–34.0)
MCHC: 32 g/dL (ref 30.0–36.0)
MCV: 89.1 fL (ref 80.0–100.0)
Platelets: 202 K/uL (ref 150–400)
RBC: 4.66 MIL/uL (ref 3.87–5.11)
RDW: 13.2 % (ref 11.5–15.5)
WBC: 10.8 K/uL — ABNORMAL HIGH (ref 4.0–10.5)
nRBC: 0 % (ref 0.0–0.2)

## 2024-10-19 LAB — GLUCOSE, CAPILLARY
Glucose-Capillary: 276 mg/dL — ABNORMAL HIGH (ref 70–99)
Glucose-Capillary: 242 mg/dL — ABNORMAL HIGH (ref 70–99)
Glucose-Capillary: 252 mg/dL — ABNORMAL HIGH (ref 70–99)
Glucose-Capillary: 182 mg/dL — ABNORMAL HIGH (ref 70–99)

## 2024-10-19 LAB — MAGNESIUM: Magnesium: 2 mg/dL (ref 1.7–2.4)

## 2024-10-19 LAB — HEPARIN LEVEL (UNFRACTIONATED): Heparin Unfractionated: 0.41 [IU]/mL (ref 0.30–0.70)

## 2024-10-19 MED ORDER — INSULIN ASPART 100 UNIT/ML IJ SOLN: 0.0000 [IU] | Freq: Every day | INTRAMUSCULAR | Status: DC

## 2024-10-19 MED ORDER — SODIUM CHLORIDE 0.9 % IV SOLN
3.0000 g | Freq: Two times a day (BID) | INTRAVENOUS | Status: DC
  Administered 2024-10-19 (×2): 3 g via INTRAVENOUS
  Filled 2024-10-19 (×3): qty 8

## 2024-10-19 MED ORDER — SODIUM CHLORIDE 0.9 % IV SOLN: INTRAVENOUS | Status: AC

## 2024-10-19 MED ORDER — SACCHAROMYCES BOULARDII 250 MG PO CAPS
250.0000 mg | ORAL_CAPSULE | Freq: Two times a day (BID) | ORAL | Status: DC
  Administered 2024-10-19 – 2024-10-22 (×7): 250 mg via ORAL
  Filled 2024-10-19 (×8): qty 1

## 2024-10-19 MED ORDER — INSULIN ASPART 100 UNIT/ML IJ SOLN
3.0000 [IU] | Freq: Three times a day (TID) | INTRAMUSCULAR | Status: DC
  Filled 2024-10-19: qty 3

## 2024-10-19 MED ORDER — CARVEDILOL 25 MG PO TABS
25.0000 mg | ORAL_TABLET | Freq: Two times a day (BID) | ORAL | Status: DC
  Administered 2024-10-19 – 2024-10-21 (×5): 25 mg via ORAL
  Filled 2024-10-19 (×6): qty 1

## 2024-10-19 MED ORDER — INSULIN ASPART 100 UNIT/ML IJ SOLN
0.0000 [IU] | Freq: Three times a day (TID) | INTRAMUSCULAR | Status: DC
  Administered 2024-10-19: 8 [IU] via SUBCUTANEOUS
  Administered 2024-10-20: 2 [IU] via SUBCUTANEOUS
  Administered 2024-10-20: 5 [IU] via SUBCUTANEOUS
  Administered 2024-10-20: 3 [IU] via SUBCUTANEOUS
  Administered 2024-10-21: 2 [IU] via SUBCUTANEOUS
  Administered 2024-10-21: 3 [IU] via SUBCUTANEOUS
  Administered 2024-10-21: 2 [IU] via SUBCUTANEOUS
  Filled 2024-10-19: qty 3
  Filled 2024-10-19: qty 8
  Filled 2024-10-19: qty 3
  Filled 2024-10-19 (×4): qty 2

## 2024-10-19 NOTE — Progress Notes (Addendum)
 Progress Note  Patient Name: Maria Foster Date of Encounter: 10/19/2024 [name] Cardiologist: Annabella Scarce, MD   Interval Summary   Hungry today and feels like abdominal symptoms are due to not eating this AM.  Symptoms not worse.  Denies chest pain or dyspnea.  Vital Signs Vitals:   10/18/24 2151 10/19/24 0200 10/19/24 0559 10/19/24 0730  BP: 137/77 (!) 146/73  (!) 157/68  Pulse: 96 83  92  Resp: 14 20  19   Temp: 98.3 F (36.8 C) 98.6 F (37 C)  98 F (36.7 C)  TempSrc: Oral Oral  Oral  SpO2: 98% 97%  99%  Weight:   72.6 kg     Intake/Output Summary (Last 24 hours) at 10/19/2024 0759 Last data filed at 10/18/2024 2000 Gross per 24 hour  Intake 576 ml  Output --  Net 576 ml      10/19/2024    5:59 AM 10/18/2024    6:22 AM 10/17/2024    5:15 AM  Last 3 Weights  Weight (lbs) 160 lb 0.9 oz 156 lb 3.2 oz 157 lb 6.5 oz  Weight (kg) 72.6 kg 70.852 kg 71.4 kg      Telemetry/ECG  SR with PVCs, personally reviewed.  Personally reviewed the 10/16/2024 ECG  SR, poor RWP, septal infarction pattern.    Physical Exam  GEN: No acute distress.   Neck: No JVD Cardiac: RRR, no murmurs, rubs, or gallops.  Respiratory: Very slight rales bilateral bases. GI: Soft, nontender, non-distended .   MS: No edema, warm and well perfused  Assessment & Plan  68 year old woman with severe multivessel CAD and history of HFrEF with normalized systolic function on medical therapy, returns with complaints of abdominal pain nausea and vomiting and is found to have worsening left ventricular systolic function with regional wall motion abnormalities primarily involving the LAD distribution.  Additional medical problems include DM type II, HTN, OSA (not on CPAP), CKD 3 AA, PAD, rheumatoid arthritis.  NSTEMI: My review of TTE shows very significant LV dysfunction.  Has known CTO of RCA and high grade disease of OM/Lcx on cth 2022 (no angina so treated medically).  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  If so, given lack of anginal symptoms, will treat medically.   If LAD shows any disease, would consider CABG in this patient with T2DM (prognostically important MVD).  If moderate LAD disease, would pursue RFR assessment to definitively exclude significant disease.  If MVD confirmed, could delay CABG to pursue GDMT for LVEF improvement.  Continue ASA 81, atorvastatin  40, Coreg  with dose adjustment as below PVCs:  Increase Coreg  to 25mg  BID. AKI on CKD 3A: Cr on admission 1.39.  Cr up to 2.64 from 2.49 yesterday.  Will defer cath today.  D/C lasix  today.  Cath tomorrow.  Will give judicious IVF (40cc/hr x 1L), check BMP in AM. Warm and well perfused, does not appear to be in shock.  If Cr up tomorrow, will consider RHC only. Acute on chronic systolic HF:  EF very much diminished on my review on TTE images.  May be due to [facility] CM.  Willl d/c lasix  given elevation in Cr, increase Coreg  to 25 BID, continue hydralazine  100 TID.  Will plan on ARB vs Entresto  and Jardiance  after cath.  Very mild rales at bases.  Watch volume status. Abdominal pain/nausea-vomiting:  CT with possible colitis, no SBO   Informed Consent   Shared Decision Making/Informed Consent The risks [stroke (1 in 1000), death (1 in 1000),  kidney failure [usually temporary] (1 in 500), bleeding (1 in 200), allergic reaction [possibly serious] (1 in 200)], benefits (diagnostic support and management of coronary artery disease) and alternatives of a cardiac catheterization were discussed in detail with Ms. Velador and she is willing to proceed.       For questions or updates, please contact [facility] Please consult [email or web address] for contact info under         Signed, Malahki Gasaway K Abdulhadi Stopa, MD

## 2024-10-19 NOTE — Progress Notes (Signed)
 [facility] Progress Note Patient: Maria Foster FMW:[id number] DOB: 1956/02/12  DOA: 10/16/2024 DOS: the patient was seen and examined on 10/19/2024  Brief Hospital Course: 68/F with history of type 2 diabetes mellitus, hypertension, OSA intolerant to CPAP, RA, CKD 3 AA, chronic systolic CHF with improved EF presented to the ED with nausea vomiting and fatigue Friday morning, she was in her usual state of health Thursday 11/27 enjoyed her Thanksgiving dinner, woke up the following day with nausea had multiple episodes of vomiting thereafter, continued to experience intermittent nausea and vomiting through the day, eventually presented to the ER, initially concern for sepsis based on lactic acidosis given fluid boluses and then developed hypoxic respiratory failure, placed on a nonrebreather mask and then weaned off to 3 L O2. - WBC 12, lactate 2.2 improved, proBNP 4212, troponin 175, 274, CTA chest negative for PE, concerning for pulmonary edema in the background of emphysema, less likely multifocal pneumonia -In the ED given LR, linezolid, cefepime  followed by Lasix  and heparin  Assessment and Plan: 1. Acute on chronic HFimpEF; acute hypoxic respiratory failure   - EF was 70-75% in August 2025, previously low   - Treated with diuresis with IV Lasix , continue Entresto , Coreg , and Aldactone  as tolerated, monitor I/Os and daily weights, continue supplemental O2 as-needed Due to worsening renal function now diuresis on hold.   2. Elevated troponin   - Troponin went from 175 to 225 to 274 in ED  - No acute ischemic findings on EKG  - NSTEMI, possibly type II MI in setting of acute CHF  - She was given ASA 325 mg and started on IV heparin  in the ED, cardiology consult appreciated.   3. Intractable nausea and vomiting. Colitis. CT scan abdomen shows evidence of colitis in the splenic flexure. While the possibility is low I will initiate the patient on antibiotic while in the hospital. Monitor  for improvement in the symptoms. Continue on soft diet. Outpatient follow-up with PCP and GI for colonoscopy in 6 to 8 weeks.. Met SIRS criteria in ED with leukocytosis, tachypnea and tachycardia. Although sepsis less likely.  Ruled out.   4.  AKI on CKD 3A  SCr is 1.39 progressively worsening now. Diuresis on hold. No obstruction on CT abdomen.   5. Type II DM  A1c was 6.9% in September 2025  Continue sliding scale insulin .   6. HTN  Continue Coreg  and hydralazine  as tolerated   Subjective: No nausea no vomiting today.  No fever no chills.  Some discomfort in the abdomen but no abdominal pain.  No BM so far.  Passing gas.  Physical Exam: Diffuse abdominal discomfort. S1-S2 present. Lower extremity swelling seen.  Data Reviewed: I have Reviewed nursing notes, Vitals, and Lab results. Since last encounter, pertinent lab results CBC and BMP   . I have ordered test including CBC and BMP  .   Disposition: Status is: Inpatient Remains inpatient appropriate because: Monitor for improvement in volume status  Family Communication: No one at bedside Level of care: Progressive   Vitals:   10/19/24 0559 10/19/24 0730 10/19/24 1233 10/19/24 1658  BP:  (!) 157/68 (!) 144/80 126/61  Pulse:  92 94 77  Resp:  19 16 16   Temp:  98 F (36.7 C) 97.7 F (36.5 C) 97.8 F (36.6 C)  TempSrc:  Oral Oral Oral  SpO2:  99% 90% 92%  Weight: 72.6 kg        Author: Yetta Blanch, MD 10/19/2024 6:13 PM  Please  look on [email or web address] to find out who is on call.

## 2024-10-19 NOTE — Progress Notes (Signed)
 Heart Failure Navigator Progress Note  Assessed for Heart & Vascular TOC clinic readiness.  Patient does not meet criteria due to she is a patient with Advanced Heart Failure team Dr. Bensimhon. .   Navigator will sign off at this time.   Stephane Haddock, BSN, Scientist, Clinical (histocompatibility And Immunogenetics) Only

## 2024-10-19 NOTE — Progress Notes (Signed)
 Mobility Specialist Progress Note;   10/19/24 1140  Mobility  Activity Ambulated with assistance  Level of Assistance Contact guard assist, steadying assist  Assistive Device Front wheel walker  Distance Ambulated (ft) 200 ft  Activity Response Tolerated well  Mobility Referral Yes  Mobility visit 1 Mobility  Mobility Specialist Start Time (ACUTE ONLY) 1140  Mobility Specialist Stop Time (ACUTE ONLY) 1156  Mobility Specialist Time Calculation (min) (ACUTE ONLY) 16 min   Pt in chair upon arrival, agreeable to mobility. Required MinG assistance during ambulation, chair follow for safety. Ambulated on RA, SPO2 93-97% throughout. Took 2x seated rest breaks d/t fatigue. Pt returned back to bed and left with all needs met, alarm on.   Lauraine Erm Mobility Specialist Please contact via SecureChat or [employer] [phone]

## 2024-10-19 NOTE — Inpatient Diabetes Management (Signed)
 Inpatient Diabetes Program Recommendations  AACE/ADA: New Consensus Statement on Inpatient Glycemic Control (2015)  Target Ranges:  Prepandial:   less than 140 mg/dL      Peak postprandial:   less than 180 mg/dL (1-2 hours)      Critically ill patients:  140 - 180 mg/dL   Lab Results  Component Value Date   GLUCAP 276 (H) 10/19/2024   HGBA1C 6.9 (H) 07/23/2024    Review of Glycemic Control  Latest Reference Range & Units 10/18/24 06:07 10/18/24 11:32 10/18/24 16:14 10/18/24 21:18 10/19/24 07:26  Glucose-Capillary 70 - 99 mg/dL 805 (H) 811 (H) 792 (H) 240 (H) 276 (H)  (H): Data is abnormally high  Diabetes history: DM2 Outpatient Diabetes medications: Tradjenta  5 mg every day, Glipizide  5 mg BID Current orders for Inpatient glycemic control: Novolog  0-6 units TID  Inpatient Diabetes Program Recommendations:    Please consider:  Semglee  8 units every day (0.1 units x 72.6 kg)  Thank you, Wyvonna Pinal, MSN, CDCES Diabetes Coordinator Inpatient Diabetes Program [phone] (team pager from 8a-5p)

## 2024-10-19 NOTE — Plan of Care (Signed)

## 2024-10-19 NOTE — Progress Notes (Signed)
 Pharmacy Antibiotic Note  Maria Foster is a 68 y.o. female admitted on 10/16/2024 with intra-abdominal infection.  Pharmacy has been consulted for Unasyn dosing.  Patient has documented amoxicillin  allergy (diarrhea). Spoke with patient at bedside to confirm no other reactions, patient confirmed only experiencing diarrhea to amoxicillin . Patient agreeable to Unasyn for intraabdominal infection. Of note, Scr 2.64 and CrCl is 19.5 - will renally adjust Unasyn. Afebrile, WBC 10.8.  Plan: Start Unasyn 3g q12 (renally adjusted)  Weight: 72.6 kg (160 lb 0.9 oz)  Temp (24hrs), Avg:98.4 F (36.9 C), Min:98 F (36.7 C), Max:98.6 F (37 C)  Recent Labs  Lab 10/12/24 1551 10/16/24 1931 10/16/24 2338 10/17/24 0122 10/17/24 0256 10/17/24 0328 10/17/24 0858 10/18/24 0234 10/18/24 1912 10/19/24 0253  WBC 7.3 12.4*  --   --   --   --  9.7 13.1*  --  10.8*  CREATININE 1.31* 1.61*  --   --  1.39*  --   --  2.10* 2.49* 2.64*  LATICACIDVEN  --   --  2.2* 2.2*  --  1.8  --   --   --   --     Estimated Creatinine Clearance: 19.5 mL/min (A) (by C-G formula based on SCr of 2.64 mg/dL (H)).    Allergies  Allergen Reactions   Metformin  And Related Other (See Comments)    Kidney injury, GI upset, diarrhea   Amoxicillin  Diarrhea   Farxiga  [Dapagliflozin ] Other (See Comments)    Yeast infections, recurrent    Antimicrobials this admission: Cefepime  2g x1 11/29 Linezolid 600mg  x1 11/29 Unasyn 12/1 >   Microbiology results: 11/28 Bcx: ngtd  Thank you for allowing pharmacy to be a part of this patient's care.  [name] 10/19/2024 9:56 AM

## 2024-10-19 NOTE — Progress Notes (Signed)
 PHARMACY - ANTICOAGULATION CONSULT NOTE  Pharmacy Consult for Heparin  Indication: chest pain/ACS  Allergies  Allergen Reactions   Metformin  And Related Other (See Comments)    Kidney injury, GI upset, diarrhea   Amoxicillin  Diarrhea   Farxiga  [Dapagliflozin ] Other (See Comments)    Yeast infections, recurrent    Patient Measurements: Weight: 70.9 kg (156 lb 3.2 oz)  Vital Signs: Temp: 98.6 F (37 C) (12/01 0200) Temp Source: Oral (12/01 0200) BP: 146/73 (12/01 0200) Pulse Rate: 83 (12/01 0200)  Labs: Recent Labs    10/17/24 0858 10/17/24 1804 10/17/24 1931 10/18/24 0050 10/18/24 0234 10/18/24 1037 10/18/24 1912 10/19/24 0253  HGB 12.0  --   --   --  13.1  --   --  13.3  HCT 37.8  --   --   --  41.2  --   --  41.5  PLT 181  --   --   --  190  --   --  202  HEPARINUNFRC  --   --   --    < >  --  0.20* 0.47 0.41  CREATININE  --   --   --   --  2.10*  --  2.49* 2.64*  TROPONINIHS  --  676* 703*  --   --   --   --   --    < > = values in this interval not displayed.    Estimated Creatinine Clearance: 19.3 mL/min (A) (by C-G formula based on SCr of 2.64 mg/dL (H)).   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   OU progressive, will medically need to have cataract surgery with lens will implantation so as to allow for complete clearance of the vitreous cavity in anticipation of likely vitrectomy in the future for progressive PDR despite previous PRP as delineated by the E DRS series of reports   Diabetes mellitus type 2 with complications (HCC)    diagnosed probably 2012 per patient   Diabetic retinopathy (HCC)    Hypertension    Rheumatoid arthritis (HCC)    Septic olecranon bursitis of right elbow 07/11/2021    Medications:  Medications Prior to Admission  Medication Sig Dispense Refill Last Dose/Taking   aspirin  EC 81 MG tablet Take 1 tablet (81 mg total) by mouth  daily. 90 tablet 3    atorvastatin  (LIPITOR) 40 MG tablet Take 1 tablet (40 mg total) by mouth daily. 90 tablet 3    carvedilol  (COREG ) 12.5 MG tablet Take 1 tablet (12.5 mg total) by mouth 2 (two) times daily. 180 tablet 1    ENTRESTO  97-103 MG TAKE 1 TABLET BY MOUTH TWICE DAILY 60 tablet 6    ferrous gluconate  (FERGON) 324 MG tablet Take 1 tablet (324 mg total) by mouth 2 (two) times daily with a meal. 180 tablet 1    furosemide  (LASIX ) 40 MG tablet Take 1 tablet (40 mg total) by mouth daily. Take extra dose for weight gain of 3lb in 1dy or 5lb in 1 week 90 tablet 3    glipiZIDE  (GLUCOTROL ) 5 MG tablet Take 1 tablet (5 mg total) by mouth 2 (two) times daily before a meal. 60 tablet 2    hydrALAZINE  (APRESOLINE ) 100 MG tablet Take 1 tablet (100 mg total) by mouth 3 (three) times daily. 270 tablet 3    isosorbide  mononitrate (IMDUR ) 60 MG 24 hr tablet Take 1 tablet (  60 mg total) by mouth daily. 30 tablet 11    latanoprost (XALATAN) 0.005 % ophthalmic solution Place 1 drop into both eyes at bedtime.      leflunomide  (ARAVA ) 20 MG tablet Take 1 tablet (20 mg total) by mouth daily. 90 tablet 0    linagliptin  (TRADJENTA ) 5 MG TABS tablet Take 1 tablet (5 mg total) by mouth daily. 90 tablet 1    methocarbamol  (ROBAXIN ) 500 MG tablet Take 1 tablet (500 mg total) by mouth at bedtime as needed for muscle spasms. 30 tablet 0    potassium chloride  SA (KLOR-CON  M) 20 MEQ tablet Take 1 tablet (20 mEq total) by mouth 2 (two) times daily. 180 tablet 1    spironolactone  (ALDACTONE ) 50 MG tablet Take 1 tablet (50 mg total) by mouth daily. 60 tablet 3    Vitamin D , Ergocalciferol , (DRISDOL ) 1.25 MG (50000 UNIT) CAPS capsule Take 1 capsule (50,000 Units total) by mouth every 7 (seven) days. 12 capsule 2     Assessment: 68 y.o. female admitted with CHF, elevated troponin and possible ACS, for heparin .  Heparin  started earlier in the day on 11/29 then D/C'd and restarted later in the day of 11/29 due to ECHO  showing WMAs.  11/30 PM: Heparin  level therapeutic at 0.47.   12/1 AM update:  Heparin  level therapeutic x 2  Goal of Therapy:  Heparin  level 0.3-0.7 units/ml Monitor platelets by anticoagulation protocol: Yes   Plan:  Continue heparin  infusion at 1150 units/hr  Daily CBC and heparin  level Monitor for bleeding  Lynwood Mckusick, PharmD, BCPS Clinical Pharmacist Phone: [phone]

## 2024-10-19 NOTE — Hospital Course (Signed)
 68/F with history of type 2 diabetes mellitus, hypertension, OSA intolerant to CPAP, RA, CKD 3 AA, chronic systolic CHF with improved EF presented to the ED with nausea vomiting and fatigue Friday morning, she was in her usual state of health Thursday 11/27 enjoyed her Thanksgiving dinner, woke up the following day with nausea had multiple episodes of vomiting thereafter, continued to experience intermittent nausea and vomiting through the day, eventually presented to the ER, initially concern for sepsis based on lactic acidosis given fluid boluses and then developed hypoxic respiratory failure, placed on a nonrebreather mask and then weaned off to 3 L O2. - WBC 12, lactate 2.2 improved, proBNP 4212, troponin 175, 274, CTA chest negative for PE, concerning for pulmonary edema in the background of emphysema, less likely multifocal pneumonia -In the ED given LR, linezolid, cefepime  followed by Lasix  and heparin .   Assessment and Plan: Acute on chronic combined systolic and diastolic CHF Acute hypoxic respiratory failure   Echocardiogram this admission 40% EF with RWMA left ventricle, apical anteroseptal and anterior wall. Treated with diuresis with IV Lasix , now on hold due to renal dysfunction Was on Entresto  but currently on hold. Continue Coreg  and Imdur  and hydralazine . Underwent right heart cath on 12/2.  Filling pressures suggest euvolemic status.   CAD. Elevated troponin   Troponin went from 175 to 225 to 274 in ED  No acute ischemic findings on EKG  NSTEMI, possibly type II MI in setting of acute CHF  Started on aspirin  and IV heparin . Underwent left heart cath on 12/2.  Angiographically stable triple-vessel disease. Continuing medical management with aspirin  for CAD.   Colitis. Intractable nausea and vomiting. CT scan abdomen shows evidence of colitis in the splenic flexure. Started on antibiotic.  Complete 7-day treatment course. Continue on soft diet. Outpatient follow-up with PCP  and GI for colonoscopy in 6 to 8 weeks.. Met SIRS criteria in ED with leukocytosis, tachypnea and tachycardia. Although sepsis less likely.  Ruled out.   AKI on CKD 3A  SCr is 1.39 on admission.  Peaked at 2.64.  Now trending down to 2.4. Diuresis on hold. No obstruction on CT abdomen. Monitor after cath.   Type II DM  A1c was 6.9% in September 2025  Continue sliding scale insulin .   HTN  Continue Coreg  and hydralazine  as tolerated

## 2024-10-19 NOTE — Telephone Encounter (Signed)
 Patient Product/process Development Scientist completed.    The patient is insured through [facility]. Patient has Medicare and is not eligible for a copay card, but may be able to apply for patient assistance or Medicare RX Payment Plan (Patient Must reach out to their plan, if eligible for payment plan), if available.  Ran test claim for Farxiga 10 mg and the current 30 day co-pay is $0.00.  Ran test claim for Jardiance 10 mg and the current 30 day co-pay is $0.00.    This test claim was processed through [facility]- copay amounts may vary at other pharmacies due to pharmacy/plan contracts, or as the patient moves through the different stages of their insurance plan.     Reyes Sharps, CPHT Pharmacy Technician Patient Advocate Specialist Lead [facility] Patient Advocate Team Direct Number: [phone]  Fax: [phone]

## 2024-10-20 ENCOUNTER — Encounter (HOSPITAL_COMMUNITY): Payer: Self-pay | Admitting: Cardiology

## 2024-10-20 ENCOUNTER — Encounter (HOSPITAL_COMMUNITY): Admission: EM | Disposition: A | Payer: Self-pay | Source: Home / Self Care | Attending: Internal Medicine

## 2024-10-20 DIAGNOSIS — I214 Non-ST elevation (NSTEMI) myocardial infarction: Secondary | ICD-10-CM | POA: Diagnosis not present

## 2024-10-20 DIAGNOSIS — N1831 Chronic kidney disease, stage 3a: Secondary | ICD-10-CM | POA: Diagnosis not present

## 2024-10-20 DIAGNOSIS — I2510 Atherosclerotic heart disease of native coronary artery without angina pectoris: Secondary | ICD-10-CM

## 2024-10-20 DIAGNOSIS — I5033 Acute on chronic diastolic (congestive) heart failure: Secondary | ICD-10-CM | POA: Diagnosis not present

## 2024-10-20 DIAGNOSIS — I429 Cardiomyopathy, unspecified: Secondary | ICD-10-CM

## 2024-10-20 DIAGNOSIS — I493 Ventricular premature depolarization: Secondary | ICD-10-CM | POA: Diagnosis not present

## 2024-10-20 DIAGNOSIS — I5043 Acute on chronic combined systolic (congestive) and diastolic (congestive) heart failure: Secondary | ICD-10-CM

## 2024-10-20 HISTORY — PX: RIGHT/LEFT HEART CATH AND CORONARY ANGIOGRAPHY: CATH118266

## 2024-10-20 LAB — CBC
HCT: 39.6 % (ref 36.0–46.0)
Hemoglobin: 13 g/dL (ref 12.0–15.0)
MCH: 28.8 pg (ref 26.0–34.0)
MCHC: 32.8 g/dL (ref 30.0–36.0)
MCV: 87.8 fL (ref 80.0–100.0)
Platelets: 181 K/uL (ref 150–400)
RBC: 4.51 MIL/uL (ref 3.87–5.11)
RDW: 13.2 % (ref 11.5–15.5)
WBC: 10.7 K/uL — ABNORMAL HIGH (ref 4.0–10.5)
nRBC: 0 % (ref 0.0–0.2)

## 2024-10-20 LAB — POCT I-STAT EG7
Acid-base deficit: 3 mmol/L — ABNORMAL HIGH (ref 0.0–2.0)
Acid-base deficit: 5 mmol/L — ABNORMAL HIGH (ref 0.0–2.0)
Acid-base deficit: 5 mmol/L — ABNORMAL HIGH (ref 0.0–2.0)
Bicarbonate: 20.1 mmol/L (ref 20.0–28.0)
Bicarbonate: 20.9 mmol/L (ref 20.0–28.0)
Bicarbonate: 22.1 mmol/L (ref 20.0–28.0)
Calcium, Ion: 1.13 mmol/L — ABNORMAL LOW (ref 1.15–1.40)
Calcium, Ion: 1.13 mmol/L — ABNORMAL LOW (ref 1.15–1.40)
Calcium, Ion: 1.15 mmol/L (ref 1.15–1.40)
HCT: 34 % — ABNORMAL LOW (ref 36.0–46.0)
HCT: 35 % — ABNORMAL LOW (ref 36.0–46.0)
HCT: 36 % (ref 36.0–46.0)
Hemoglobin: 11.6 g/dL — ABNORMAL LOW (ref 12.0–15.0)
Hemoglobin: 11.9 g/dL — ABNORMAL LOW (ref 12.0–15.0)
Hemoglobin: 12.2 g/dL (ref 12.0–15.0)
O2 Saturation: 52 %
O2 Saturation: 54 %
O2 Saturation: 55 %
Potassium: 3.9 mmol/L (ref 3.5–5.1)
Potassium: 3.9 mmol/L (ref 3.5–5.1)
Potassium: 3.9 mmol/L (ref 3.5–5.1)
Sodium: 134 mmol/L — ABNORMAL LOW (ref 135–145)
Sodium: 136 mmol/L (ref 135–145)
Sodium: 137 mmol/L (ref 135–145)
TCO2: 21 mmol/L — ABNORMAL LOW (ref 22–32)
TCO2: 22 mmol/L (ref 22–32)
TCO2: 23 mmol/L (ref 22–32)
pCO2, Ven: 37.1 mmHg — ABNORMAL LOW (ref 44–60)
pCO2, Ven: 38.8 mmHg — ABNORMAL LOW (ref 44–60)
pCO2, Ven: 39.1 mmHg — ABNORMAL LOW (ref 44–60)
pH, Ven: 7.337 (ref 7.25–7.43)
pH, Ven: 7.341 (ref 7.25–7.43)
pH, Ven: 7.364 (ref 7.25–7.43)
pO2, Ven: 29 mmHg — CL (ref 32–45)
pO2, Ven: 30 mmHg — CL (ref 32–45)
pO2, Ven: 30 mmHg — CL (ref 32–45)

## 2024-10-20 LAB — GLUCOSE, CAPILLARY
Glucose-Capillary: 205 mg/dL — ABNORMAL HIGH (ref 70–99)
Glucose-Capillary: 150 mg/dL — ABNORMAL HIGH (ref 70–99)
Glucose-Capillary: 182 mg/dL — ABNORMAL HIGH (ref 70–99)
Glucose-Capillary: 177 mg/dL — ABNORMAL HIGH (ref 70–99)

## 2024-10-20 LAB — POCT I-STAT 7, (LYTES, BLD GAS, ICA,H+H)
Acid-base deficit: 6 mmol/L — ABNORMAL HIGH (ref 0.0–2.0)
Bicarbonate: 18.8 mmol/L — ABNORMAL LOW (ref 20.0–28.0)
Calcium, Ion: 1.13 mmol/L — ABNORMAL LOW (ref 1.15–1.40)
HCT: 35 % — ABNORMAL LOW (ref 36.0–46.0)
Hemoglobin: 11.9 g/dL — ABNORMAL LOW (ref 12.0–15.0)
O2 Saturation: 90 %
Potassium: 3.8 mmol/L (ref 3.5–5.1)
Sodium: 136 mmol/L (ref 135–145)
TCO2: 20 mmol/L — ABNORMAL LOW (ref 22–32)
pCO2 arterial: 32.3 mmHg (ref 32–48)
pH, Arterial: 7.373 (ref 7.35–7.45)
pO2, Arterial: 59 mmHg — ABNORMAL LOW (ref 83–108)

## 2024-10-20 LAB — BASIC METABOLIC PANEL WITH GFR
Anion gap: 10 (ref 5–15)
BUN: 58 mg/dL — ABNORMAL HIGH (ref 8–23)
CO2: 20 mmol/L — ABNORMAL LOW (ref 22–32)
Calcium: 8.5 mg/dL — ABNORMAL LOW (ref 8.9–10.3)
Chloride: 104 mmol/L (ref 98–111)
Creatinine, Ser: 2.44 mg/dL — ABNORMAL HIGH (ref 0.44–1.00)
GFR, Estimated: 21 mL/min — ABNORMAL LOW (ref >60)
Glucose, Bld: 166 mg/dL — ABNORMAL HIGH (ref 70–99)
Potassium: 4.2 mmol/L (ref 3.5–5.1)
Sodium: 134 mmol/L — ABNORMAL LOW (ref 135–145)

## 2024-10-20 LAB — HEPARIN LEVEL (UNFRACTIONATED): Heparin Unfractionated: 0.48 [IU]/mL (ref 0.30–0.70)

## 2024-10-20 SURGERY — RIGHT/LEFT HEART CATH AND CORONARY ANGIOGRAPHY
Anesthesia: LOCAL

## 2024-10-20 MED ORDER — VERAPAMIL HCL 2.5 MG/ML IV SOLN
INTRAVENOUS | Status: AC
  Filled 2024-10-20: qty 2

## 2024-10-20 MED ORDER — SODIUM CHLORIDE 0.9% FLUSH: 3.0000 mL | INTRAVENOUS | Status: DC | PRN

## 2024-10-20 MED ORDER — HEPARIN (PORCINE) IN NACL 1000-0.9 UT/500ML-% IV SOLN
INTRAVENOUS | Status: DC | PRN
  Administered 2024-10-20 (×2): 500 mL

## 2024-10-20 MED ORDER — HEPARIN SODIUM (PORCINE) 5000 UNIT/ML IJ SOLN
5000.0000 [IU] | Freq: Three times a day (TID) | INTRAMUSCULAR | Status: DC
  Administered 2024-10-21 – 2024-10-22 (×4): 5000 [IU] via SUBCUTANEOUS
  Filled 2024-10-20 (×4): qty 1

## 2024-10-20 MED ORDER — AMOXICILLIN-POT CLAVULANATE 500-125 MG PO TABS
1.0000 | ORAL_TABLET | Freq: Two times a day (BID) | ORAL | Status: DC
  Administered 2024-10-20 – 2024-10-22 (×4): 1 via ORAL
  Filled 2024-10-20 (×6): qty 1

## 2024-10-20 MED ORDER — HEPARIN SODIUM (PORCINE) 1000 UNIT/ML IJ SOLN
INTRAMUSCULAR | Status: AC
  Filled 2024-10-20: qty 10

## 2024-10-20 MED ORDER — LIDOCAINE HCL (PF) 1 % IJ SOLN
INTRAMUSCULAR | Status: AC
  Filled 2024-10-20: qty 30

## 2024-10-20 MED ORDER — ISOSORBIDE MONONITRATE ER 60 MG PO TB24
60.0000 mg | ORAL_TABLET | Freq: Every day | ORAL | Status: DC
  Administered 2024-10-20 – 2024-10-22 (×3): 60 mg via ORAL
  Filled 2024-10-20 (×3): qty 1

## 2024-10-20 MED ORDER — SODIUM CHLORIDE 0.9 % IV SOLN: 250.0000 mL | INTRAVENOUS | Status: AC | PRN

## 2024-10-20 MED ORDER — VERAPAMIL HCL 2.5 MG/ML IV SOLN
INTRAVENOUS | Status: DC | PRN
  Administered 2024-10-20: 10 mL via INTRA_ARTERIAL

## 2024-10-20 MED ORDER — SODIUM CHLORIDE 0.9% FLUSH
3.0000 mL | Freq: Two times a day (BID) | INTRAVENOUS | Status: DC
  Administered 2024-10-20 – 2024-10-22 (×5): 3 mL via INTRAVENOUS

## 2024-10-20 MED ORDER — HEPARIN SODIUM (PORCINE) 1000 UNIT/ML IJ SOLN
INTRAMUSCULAR | Status: DC | PRN
  Administered 2024-10-20: 3500 [IU] via INTRAVENOUS

## 2024-10-20 MED ORDER — IOHEXOL 350 MG/ML SOLN
INTRAVENOUS | Status: DC | PRN
  Administered 2024-10-20: 35 mL

## 2024-10-20 MED ORDER — FREE WATER
500.0000 mL | Freq: Once | Status: AC
  Administered 2024-10-20: 500 mL via ORAL

## 2024-10-20 MED ORDER — LABETALOL HCL 5 MG/ML IV SOLN: 10.0000 mg | INTRAVENOUS | Status: AC | PRN

## 2024-10-20 MED ORDER — LIDOCAINE HCL (PF) 1 % IJ SOLN
INTRAMUSCULAR | Status: DC | PRN
  Administered 2024-10-20: 5 mL via INTRADERMAL

## 2024-10-20 MED ORDER — HYDRALAZINE HCL 20 MG/ML IJ SOLN: 10.0000 mg | INTRAMUSCULAR | Status: AC | PRN

## 2024-10-20 SURGICAL SUPPLY — 11 items
CATH BALLN WEDGE 5F 110CM (CATHETERS) IMPLANT
CATH INFINITI AMBI 5FR TG (CATHETERS) IMPLANT
DEVICE RAD COMP TR BAND LRG (VASCULAR PRODUCTS) IMPLANT
GLIDESHEATH SLEND SS 6F .021 (SHEATH) IMPLANT
GUIDEWIRE INQWIRE 1.5J.035X260 (WIRE) IMPLANT
KIT NAMIC PS PRESSURIZED FLUID (KITS) IMPLANT
PACK CARDIAC CATHETERIZATION (CUSTOM PROCEDURE TRAY) ×1 IMPLANT
SET ATX-X65L (MISCELLANEOUS) IMPLANT
SHEATH GLIDE SLENDER 4/5FR (SHEATH) IMPLANT
SHEATH PROBE COVER 6X72 (BAG) IMPLANT
WIRE EMERALD 3MM-J .025X260CM (WIRE) IMPLANT

## 2024-10-20 NOTE — Progress Notes (Signed)
 Progress Note  Patient Name: Maria Foster Date of Encounter: 10/20/2024 [facility] Cardiologist: Annabella Scarce, MD   Interval Summary   NAEO  No abdominal pain, feels much better  Cr peaked at 2.65 now down to 2.44  Vital Signs Vitals:   10/19/24 2320 10/20/24 0321 10/20/24 0628 10/20/24 0721  BP: 105/75 126/72  (!) 126/56  Pulse: 83   78  Resp: 18   14  Temp: 97.6 F (36.4 C) 98.1 F (36.7 C)  98.1 F (36.7 C)  TempSrc: Oral Oral  Oral  SpO2: 98%   98%  Weight:   71.9 kg    No intake or output data in the 24 hours ending 10/20/24 0912     10/20/2024    6:28 AM 10/19/2024    5:59 AM 10/18/2024    6:22 AM  Last 3 Weights  Weight (lbs) 158 lb 8.2 oz 160 lb 0.9 oz 156 lb 3.2 oz  Weight (kg) 71.9 kg 72.6 kg 70.852 kg      Telemetry/ECG  SR with artifact, personally reviewed.  Personally reviewed the 10/16/2024 ECG  SR, poor RWP, septal infarction pattern.    Physical Exam  GEN: No acute distress.   Neck: No JVD Cardiac: RRR, no murmurs, rubs, or gallops.  Respiratory: CTA-B. GI: Soft, nontender, non-distended .   MS: No edema, warm and well perfused  Assessment & Plan  68 year old woman with severe multivessel CAD and history of HFrEF with normalized systolic function on medical therapy, returns with complaints of abdominal pain nausea and vomiting and is found to have worsening left ventricular systolic function with regional wall motion abnormalities primarily involving the LAD distribution.  Additional medical problems include DM type II, HTN, OSA (not on CPAP), CKD 3 AA, PAD, rheumatoid arthritis.  NSTEMI: My review of TTE shows very significant LV dysfunction.  Has known CTO of RCA and high grade disease of OM/Lcx on cth 2022 (no angina so treated medically).  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  If so, given lack of anginal symptoms, will treat medically.   If LAD shows any disease, would consider CABG in this patient with T2DM (prognostically important  MVD).  If moderate LAD disease, would pursue RFR assessment to definitively exclude significant disease.  If MVD confirmed, could delay CABG to pursue GDMT for LVEF improvement prior to OR   Continue ASA 81, atorvastatin  40, Coreg  25mg  BID PVCs:  Coreg  to 25mg  BID, improved burden on tele AKI on CKD 3A: Cr on admission 1.39.  Cr up to 2.64 now down to 2.44.  Cath today.  Will consider ARB and SGLT2i after cath Acute on chronic systolic HF:  EF very much diminished on my review on TTE images.  May be due to [facility] CM.   Continue Coreg  to 25 BID, continue hydralazine  100 TID.  Will plan on ARB vs Entresto  and Jardiance  after cath.  Assess RHC today Abdominal pain/nausea-vomiting:  CT with possible colitis, no SBO; improved.   Informed Consent   Shared Decision Making/Informed Consent The risks [stroke (1 in 1000), death (1 in 1000), kidney failure [usually temporary] (1 in 500), bleeding (1 in 200), allergic reaction [possibly serious] (1 in 200)], benefits (diagnostic support and management of coronary artery disease) and alternatives of a cardiac catheterization were discussed in detail with Ms. Cravens and she is willing to proceed.       For questions or updates, please contact [facility] Please consult [email or web address] for contact info under  Signed, Grigor Lipschutz K Karina Lenderman, MD

## 2024-10-20 NOTE — Plan of Care (Addendum)
  Problem: Education: Goal: Knowledge of General Education information will improve Description: Including pain rating scale, medication(s)/side effects and non-pharmacologic comfort measures Outcome: Progressing. Pt aware of plan of care. TR band removed and dressing applied. Educated pt on care of site and dressing.   Problem: Clinical Measurements: Goal: Ability to maintain clinical measurements within normal limits will improve Outcome: Progressing. Pt without chest pain. Goal: Will remain free from infection Outcome: Progressing

## 2024-10-20 NOTE — H&P (View-Only) (Signed)
 Progress Note  Patient Name: Maria Foster Date of Encounter: 10/20/2024 [facility] Cardiologist: Annabella Scarce, MD   Interval Summary   NAEO  No abdominal pain, feels much better  Cr peaked at 2.65 now down to 2.44  Vital Signs Vitals:   10/19/24 2320 10/20/24 0321 10/20/24 0628 10/20/24 0721  BP: 105/75 126/72  (!) 126/56  Pulse: 83   78  Resp: 18   14  Temp: 97.6 F (36.4 C) 98.1 F (36.7 C)  98.1 F (36.7 C)  TempSrc: Oral Oral  Oral  SpO2: 98%   98%  Weight:   71.9 kg    No intake or output data in the 24 hours ending 10/20/24 0912     10/20/2024    6:28 AM 10/19/2024    5:59 AM 10/18/2024    6:22 AM  Last 3 Weights  Weight (lbs) 158 lb 8.2 oz 160 lb 0.9 oz 156 lb 3.2 oz  Weight (kg) 71.9 kg 72.6 kg 70.852 kg      Telemetry/ECG  SR with artifact, personally reviewed.  Personally reviewed the 10/16/2024 ECG  SR, poor RWP, septal infarction pattern.    Physical Exam  GEN: No acute distress.   Neck: No JVD Cardiac: RRR, no murmurs, rubs, or gallops.  Respiratory: CTA-B. GI: Soft, nontender, non-distended .   MS: No edema, warm and well perfused  Assessment & Plan  68 year old woman with severe multivessel CAD and history of HFrEF with normalized systolic function on medical therapy, returns with complaints of abdominal pain nausea and vomiting and is found to have worsening left ventricular systolic function with regional wall motion abnormalities primarily involving the LAD distribution.  Additional medical problems include DM type II, HTN, OSA (not on CPAP), CKD 3 AA, PAD, rheumatoid arthritis.  NSTEMI: My review of TTE shows very significant LV dysfunction.  Has known CTO of RCA and high grade disease of OM/Lcx on cth 2022 (no angina so treated medically).  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  If so, given lack of anginal symptoms, will treat medically.   If LAD shows any disease, would consider CABG in this patient with T2DM (prognostically important  MVD).  If moderate LAD disease, would pursue RFR assessment to definitively exclude significant disease.  If MVD confirmed, could delay CABG to pursue GDMT for LVEF improvement prior to OR   Continue ASA 81, atorvastatin  40, Coreg  25mg  BID PVCs:  Coreg  to 25mg  BID, improved burden on tele AKI on CKD 3A: Cr on admission 1.39.  Cr up to 2.64 now down to 2.44.  Cath today.  Will consider ARB and SGLT2i after cath Acute on chronic systolic HF:  EF very much diminished on my review on TTE images.  May be due to [facility] CM.   Continue Coreg  to 25 BID, continue hydralazine  100 TID.  Will plan on ARB vs Entresto  and Jardiance  after cath.  Assess RHC today Abdominal pain/nausea-vomiting:  CT with possible colitis, no SBO; improved.   Informed Consent   Shared Decision Making/Informed Consent The risks [stroke (1 in 1000), death (1 in 1000), kidney failure [usually temporary] (1 in 500), bleeding (1 in 200), allergic reaction [possibly serious] (1 in 200)], benefits (diagnostic support and management of coronary artery disease) and alternatives of a cardiac catheterization were discussed in detail with Ms. Cravens and she is willing to proceed.       For questions or updates, please contact [facility] Please consult [email or web address] for contact info under  Signed, Grigor Lipschutz K Karina Lenderman, MD

## 2024-10-20 NOTE — Progress Notes (Signed)
 [facility] Progress Note Patient: Maria Foster FMW:[id number] DOB: Apr 08, 1956  DOA: 10/16/2024 DOS: the patient was seen and examined on 10/20/2024  Brief Hospital Course: 68/F with history of type 2 diabetes mellitus, hypertension, OSA intolerant to CPAP, RA, CKD 3 AA, chronic systolic CHF with improved EF presented to the ED with nausea vomiting and fatigue Friday morning, she was in her usual state of health Thursday 11/27 enjoyed her Thanksgiving dinner, woke up the following day with nausea had multiple episodes of vomiting thereafter, continued to experience intermittent nausea and vomiting through the day, eventually presented to the ER, initially concern for sepsis based on lactic acidosis given fluid boluses and then developed hypoxic respiratory failure, placed on a nonrebreather mask and then weaned off to 3 L O2. - WBC 12, lactate 2.2 improved, proBNP 4212, troponin 175, 274, CTA chest negative for PE, concerning for pulmonary edema in the background of emphysema, less likely multifocal pneumonia -In the ED given LR, linezolid, cefepime  followed by Lasix  and heparin .   Assessment and Plan: Acute on chronic combined systolic and diastolic CHF Acute hypoxic respiratory failure   Echocardiogram this admission 40% EF with RWMA left ventricle, apical anteroseptal and anterior wall. Treated with diuresis with IV Lasix , now on hold due to renal dysfunction Was on Entresto  but currently on hold. Continue Coreg  and Imdur  and hydralazine . Underwent right heart cath on 12/2.  Filling pressures suggest euvolemic status.   CAD. Elevated troponin   Troponin went from 175 to 225 to 274 in ED  No acute ischemic findings on EKG  NSTEMI, possibly type II MI in setting of acute CHF  Started on aspirin  and IV heparin . Underwent left heart cath on 12/2.  Angiographically stable triple-vessel disease. Continuing medical management with aspirin  for CAD.   Colitis. Intractable nausea and  vomiting. CT scan abdomen shows evidence of colitis in the splenic flexure. Started on antibiotic.  Complete 7-day treatment course. Continue on soft diet. Outpatient follow-up with PCP and GI for colonoscopy in 6 to 8 weeks.. Met SIRS criteria in ED with leukocytosis, tachypnea and tachycardia. Although sepsis less likely.  Ruled out.   AKI on CKD 3A  SCr is 1.39 on admission.  Peaked at 2.64.  Now trending down to 2.4. Diuresis on hold. No obstruction on CT abdomen. Monitor after cath.   Type II DM  A1c was 6.9% in September 2025  Continue sliding scale insulin .   HTN  Continue Coreg  and hydralazine  as tolerated   Subjective: No nausea no vomiting.  No abdominal pain.  Passing gas.  Physical Exam: Basilar crackles. S1-S2 present  present bowel sound. Trace edema.  Data Reviewed: I have Reviewed nursing notes, Vitals, and Lab results. Since last encounter, pertinent lab results CBC and BMP   . I have ordered test including CBC and BMP  . I have discussed pt's care plan and test results with cardiology  .   Disposition: Status is: Inpatient Remains inpatient appropriate because: Monitor for improvement in renal function  heparin  injection 5,000 Units Start: 10/21/24 0600   Family Communication: Discussed with family at bedside Level of care: Progressive   Vitals:   10/20/24 1546 10/20/24 1600 10/20/24 1700 10/20/24 1800  BP: 114/61 (!) 107/58 (!) 109/56 (!) 109/58  Pulse: 77 77 81 82  Resp: 19 19 16 18   Temp: 97.9 F (36.6 C)     TempSrc: Oral     SpO2: 96% 94% 97% 98%  Weight:  Author: Yetta Blanch, MD 10/20/2024 6:55 PM  Please look on [email or web address] to find out who is on call.

## 2024-10-20 NOTE — Progress Notes (Signed)
 PHARMACY - ANTICOAGULATION CONSULT NOTE  Pharmacy Consult for Heparin  Indication: chest pain/ACS  Allergies  Allergen Reactions   Metformin  And Related Other (See Comments)    Kidney injury, GI upset, diarrhea   Amoxicillin  Diarrhea   Farxiga  [email or web address] ] Other (See Comments)    Yeast infections, recurrent    Patient Measurements: Weight: 71.9 kg (158 lb 8.2 oz)  Vital Signs: Temp: 98.1 F (36.7 C) (12/02 0321) Temp Source: Oral (12/02 0321) BP: 126/72 (12/02 0321) Pulse Rate: 83 (12/01 2320)  Labs: Recent Labs    10/17/24 1804 10/17/24 1931 10/18/24 0050 10/18/24 0234 10/18/24 1037 10/18/24 1912 10/19/24 0253 10/20/24 0313  HGB  --   --   --  13.1  --   --  13.3 13.0  HCT  --   --   --  41.2  --   --  41.5 39.6  PLT  --   --   --  190  --   --  202 181  HEPARINUNFRC  --   --    < >  --    < > 0.47 0.41 0.48  CREATININE  --   --    < > 2.10*  --  2.49* 2.64* 2.44*  TROPONINIHS 676* 703*  --   --   --   --   --   --    < > = values in this interval not displayed.    Estimated Creatinine Clearance: 21 mL/min (A) (by C-G formula based on SCr of 2.44 mg/dL (H)).   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   OU progressive, will medically need to have cataract surgery with lens will implantation so as to allow for complete clearance of the vitreous cavity in anticipation of likely vitrectomy in the future for progressive PDR despite previous PRP as delineated by the E DRS series of reports   Diabetes mellitus type 2 with complications (HCC)    diagnosed probably 2012 per patient   Diabetic retinopathy (HCC)    Hypertension    Rheumatoid arthritis (HCC)    Septic olecranon bursitis of right elbow 07/11/2021    Medications:  Medications Prior to Admission  Medication Sig Dispense Refill Last Dose/Taking   aspirin  EC 81 MG tablet Take 1 tablet (81 mg total) by mouth  daily. 90 tablet 3    atorvastatin  (LIPITOR) 40 MG tablet Take 1 tablet (40 mg total) by mouth daily. 90 tablet 3    carvedilol  (COREG ) 12.5 MG tablet Take 1 tablet (12.5 mg total) by mouth 2 (two) times daily. 180 tablet 1    ENTRESTO  97-103 MG TAKE 1 TABLET BY MOUTH TWICE DAILY 60 tablet 6    ferrous gluconate  (FERGON) 324 MG tablet Take 1 tablet (324 mg total) by mouth 2 (two) times daily with a meal. 180 tablet 1    furosemide  (LASIX ) 40 MG tablet Take 1 tablet (40 mg total) by mouth daily. Take extra dose for weight gain of 3lb in 1dy or 5lb in 1 week 90 tablet 3    glipiZIDE  (GLUCOTROL ) 5 MG tablet Take 1 tablet (5 mg total) by mouth 2 (two) times daily before a meal. 60 tablet 2    hydrALAZINE  (APRESOLINE ) 100 MG tablet Take 1 tablet (100 mg total) by mouth 3 (three) times daily. 270 tablet 3    isosorbide  mononitrate (IMDUR ) 60 MG 24 hr tablet Take 1 tablet (  60 mg total) by mouth daily. 30 tablet 11    latanoprost (XALATAN) 0.005 % ophthalmic solution Place 1 drop into both eyes at bedtime.      leflunomide  (ARAVA ) 20 MG tablet Take 1 tablet (20 mg total) by mouth daily. 90 tablet 0    linagliptin  (TRADJENTA ) 5 MG TABS tablet Take 1 tablet (5 mg total) by mouth daily. 90 tablet 1    methocarbamol  (ROBAXIN ) 500 MG tablet Take 1 tablet (500 mg total) by mouth at bedtime as needed for muscle spasms. 30 tablet 0    potassium chloride  SA (KLOR-CON  M) 20 MEQ tablet Take 1 tablet (20 mEq total) by mouth 2 (two) times daily. 180 tablet 1    spironolactone  (ALDACTONE ) 50 MG tablet Take 1 tablet (50 mg total) by mouth daily. 60 tablet 3    Vitamin D , Ergocalciferol , (DRISDOL ) 1.25 MG (50000 UNIT) CAPS capsule Take 1 capsule (50,000 Units total) by mouth every 7 (seven) days. 12 capsule 2     Assessment: 68 y.o. female admitted with CHF, elevated troponin and possible ACS, for heparin .  Heparin  started earlier in the day on 11/29 then D/C'd and restarted later in the day of 11/29 due to ECHO  showing WMAs.  Heparin  level 0.48 therapeutic on 1150 units/hr. Hemoglobin and platelets are stable. No issues with heparin  infusion or signs/symptoms of bleeding.  Goal of Therapy:  Heparin  level 0.3-0.7 units/ml Monitor platelets by anticoagulation protocol: Yes   Plan:  Continue heparin  infusion at 1150 units/hr  Daily CBC and heparin  level Monitor for bleeding  Dionicia Canavan, PharmD, RPh PGY1 Acute Care Pharmacy Resident [facility]  10/20/2024 7:12 AM

## 2024-10-20 NOTE — Interval H&P Note (Signed)
 History and Physical Interval Note:  10/20/2024 10:18 AM  Maria Foster  has presented today for surgery, with the diagnosis of NSTEMI.  The various methods of treatment have been discussed with the patient and family. After consideration of risks, benefits and other options for treatment, the patient has consented to  Procedure(s): RIGHT/LEFT HEART CATH AND CORONARY ANGIOGRAPHY (N/A) as a surgical intervention.  The patient's history has been reviewed, patient examined, no change in status, stable for surgery.  I have reviewed the patient's chart and labs.  Questions were answered to the patient's satisfaction.     Alm Clay

## 2024-10-20 NOTE — Progress Notes (Signed)
 Mobility Specialist Progress Note;   10/20/24 0916  Mobility  Activity Ambulated with assistance  Level of Assistance Contact guard assist, steadying assist  Assistive Device Front wheel walker  Distance Ambulated (ft) 150 ft  Activity Response Tolerated well  Mobility Referral Yes  Mobility visit 1 Mobility  Mobility Specialist Start Time (ACUTE ONLY) [id number]  Mobility Specialist Stop Time (ACUTE ONLY) 0927  Mobility Specialist Time Calculation (min) (ACUTE ONLY) 11 min   Pt agreeable to mobility. Requested assistance to BR at [facility]. Required light MinG assistance during ambulation. VSS on RA throughout and no c/o when asked. Pt returned back to bed and left with all needs met, call bell in reach.   Lauraine Erm Mobility Specialist Please contact via SecureChat or [employer] [phone]

## 2024-10-20 NOTE — Plan of Care (Signed)

## 2024-10-20 NOTE — Progress Notes (Signed)
 While preparing to administer evening medications, observed that Pt's BP had dropped to 87/53 (64). Pt had recently ambulated to bathroom. Pt asymptomatic with strong pulses and appears well-perfused, denies SOB or lightheaded/dizziness. Brachial and radial arterial sites are level 0. Obtained manual BP to verify: 85/55. Paged Dr. Roxanne and updated. Orders to monitor BP Q30, keep pt on bedrest, and hold 2200 hydralazine .

## 2024-10-20 NOTE — Care Management Important Message (Signed)
 Important Message  Patient Details  Name: Maria Foster MRN: [id number] Date of Birth: 1956/10/03   Important Message Given:  Yes - Medicare IM     Jennie Laneta Dragon 10/20/2024, 4:02 PM

## 2024-10-21 DIAGNOSIS — I214 Non-ST elevation (NSTEMI) myocardial infarction: Secondary | ICD-10-CM | POA: Diagnosis not present

## 2024-10-21 DIAGNOSIS — I5033 Acute on chronic diastolic (congestive) heart failure: Secondary | ICD-10-CM | POA: Diagnosis not present

## 2024-10-21 DIAGNOSIS — N1831 Chronic kidney disease, stage 3a: Secondary | ICD-10-CM | POA: Diagnosis not present

## 2024-10-21 DIAGNOSIS — I493 Ventricular premature depolarization: Secondary | ICD-10-CM | POA: Diagnosis not present

## 2024-10-21 DIAGNOSIS — N179 Acute kidney failure, unspecified: Secondary | ICD-10-CM | POA: Diagnosis not present

## 2024-10-21 LAB — CBC
HCT: 34.2 % — ABNORMAL LOW (ref 36.0–46.0)
Hemoglobin: 11.2 g/dL — ABNORMAL LOW (ref 12.0–15.0)
MCH: 28.7 pg (ref 26.0–34.0)
MCHC: 32.7 g/dL (ref 30.0–36.0)
MCV: 87.7 fL (ref 80.0–100.0)
Platelets: 146 K/uL — ABNORMAL LOW (ref 150–400)
RBC: 3.9 MIL/uL (ref 3.87–5.11)
RDW: 13.5 % (ref 11.5–15.5)
WBC: 11.4 K/uL — ABNORMAL HIGH (ref 4.0–10.5)
nRBC: 0 % (ref 0.0–0.2)

## 2024-10-21 LAB — BASIC METABOLIC PANEL WITH GFR
Anion gap: 12 (ref 5–15)
BUN: 54 mg/dL — ABNORMAL HIGH (ref 8–23)
CO2: 17 mmol/L — ABNORMAL LOW (ref 22–32)
Calcium: 8.4 mg/dL — ABNORMAL LOW (ref 8.9–10.3)
Chloride: 105 mmol/L (ref 98–111)
Creatinine, Ser: 2.11 mg/dL — ABNORMAL HIGH (ref 0.44–1.00)
GFR, Estimated: 25 mL/min — ABNORMAL LOW (ref >60)
Glucose, Bld: 120 mg/dL — ABNORMAL HIGH (ref 70–99)
Potassium: 4.1 mmol/L (ref 3.5–5.1)
Sodium: 134 mmol/L — ABNORMAL LOW (ref 135–145)

## 2024-10-21 LAB — GLUCOSE, CAPILLARY
Glucose-Capillary: 131 mg/dL — ABNORMAL HIGH (ref 70–99)
Glucose-Capillary: 183 mg/dL — ABNORMAL HIGH (ref 70–99)
Glucose-Capillary: 128 mg/dL — ABNORMAL HIGH (ref 70–99)
Glucose-Capillary: 160 mg/dL — ABNORMAL HIGH (ref 70–99)

## 2024-10-21 MED ORDER — HYDRALAZINE HCL 50 MG PO TABS: 75.0000 mg | ORAL_TABLET | Freq: Three times a day (TID) | ORAL | Status: DC

## 2024-10-21 MED ORDER — CARVEDILOL 12.5 MG PO TABS
12.5000 mg | ORAL_TABLET | Freq: Two times a day (BID) | ORAL | Status: DC
  Administered 2024-10-21 – 2024-10-22 (×2): 12.5 mg via ORAL
  Filled 2024-10-21 (×2): qty 1

## 2024-10-21 MED ORDER — ALUM & MAG HYDROXIDE-SIMETH 200-200-20 MG/5ML PO SUSP
15.0000 mL | Freq: Once | ORAL | Status: AC
  Administered 2024-10-21: 15 mL via ORAL
  Filled 2024-10-21: qty 30

## 2024-10-21 MED ORDER — HYDRALAZINE HCL 50 MG PO TABS
75.0000 mg | ORAL_TABLET | Freq: Three times a day (TID) | ORAL | Status: DC
  Administered 2024-10-21 – 2024-10-22 (×3): 75 mg via ORAL
  Filled 2024-10-21 (×3): qty 1

## 2024-10-21 NOTE — Progress Notes (Signed)
 PROGRESS NOTE  Maria Foster FMW:[id number] DOB: 20-Oct-1956 DOA: 10/16/2024 PCP: Bulah Alm RAMAN, PA-C   LOS: 4 days   Brief Narrative / Interim history: 68 year old female with DM2, HTN, OSA intolerant of CPAP, RA, CKD 3A, chronic systolic CHF who comes into the hospital with nausea, vomiting, fatigue.  She was feeling well up until her Thanksgiving dinner next morning she woke up with multiple episodes of vomiting.  She continues to feel sick, and eventually presented to the ER.  There were initial concerns about sepsis based on elevated lactic acid, was given a lot of fluids and then developed hypoxic respiratory failure.  CT angiogram was negative for PE but was concern for pulmonary edema.  Subjective / 24h Interval events: Doing well today, feeling a whole lot better.  Assesement and Plan: Principal problem Acute on chronic combined CHF, acute hypoxemia -patient is admitted to the hospital with increased work of breathing, felt to be fluid overloaded.  She was initially placed on diuresis with IV Lasix , but developed AKI and is now on hold.  Her hypoxemia resolved and currently she is on room air.  Underwent a 2D echocardiogram which showed LVEF 40% with RWMA left ventricle, apical anteroseptal and anterior wall.  Cardiology consulted, appreciate input.  Right heart cath 12/2 showed euvolemia at the time of the study - Continue medications as per cardiology, currently on Coreg , hydralazine , Imdur .  Potential discharge home tomorrow  Active problems CAD, elevated troponin, non-STEMI -started initially on aspirin  and heparin , underwent cardiac catheterization 12/2 which showed angiographically stable triple-vessel disease, ongoing medical management was recommended.  Colitis, nausea, vomiting-CT scan of the abdomen showed colitis at the splenic flexure.  Has been started on Augmentin, completed 7-day course.  Follow-up with PCP and GI for potential colonoscopy once acute episode  resolves  Acute kidney injury on CKD 3A -creatinine was 1.4 on admission, peaked at 2.6 after IV Lasix .  Diuresis was held, creatinine currently 2.1  DM2-continue sliding scale  Essential hypertension-continue Coreg , hydralazine   Scheduled Meds:  amoxicillin -clavulanate  1 tablet Oral Q12H   aspirin  EC  81 mg Oral Daily   atorvastatin   40 mg Oral Daily   carvedilol   12.5 mg Oral BID WC   feeding supplement  237 mL Oral BID BM   heparin  injection (subcutaneous)  5,000 Units Subcutaneous Q8H   hydrALAZINE   75 mg Oral TID   insulin  aspart  0-15 Units Subcutaneous TID WC   insulin  aspart  0-5 Units Subcutaneous QHS   insulin  aspart  3 Units Subcutaneous TID WC   isosorbide  mononitrate  60 mg Oral Daily   saccharomyces boulardii  250 mg Oral BID   sodium chloride  flush  3 mL Intravenous Q12H   Continuous Infusions:  sodium chloride      PRN Meds:.sodium chloride , acetaminophen  **OR** acetaminophen , ipratropium-albuterol, oxyCODONE , senna, sodium chloride  flush  Current Outpatient Medications  Medication Instructions   aspirin  EC 81 mg, Oral, Daily   atorvastatin  (LIPITOR) 40 mg, Oral, Daily   carvedilol  (COREG ) 12.5 mg, Oral, 2 times daily   ENTRESTO  97-103 MG 1 tablet, Oral, 2 times daily   ferrous gluconate  (FERGON) 324 mg, Oral, 2 times daily with meals   furosemide  (LASIX ) 40 mg, Oral, Daily, Take extra dose for weight gain of 3lb in 1dy or 5lb in 1 week   glipiZIDE  (GLUCOTROL ) 5 mg, Oral, 2 times daily before meals   hydrALAZINE  (APRESOLINE ) 100 mg, Oral, 3 times daily   isosorbide  mononitrate (IMDUR ) 60 mg, Oral, Daily  latanoprost (XALATAN) 0.005 % ophthalmic solution 1 drop, Daily at bedtime   leflunomide  (ARAVA ) 20 mg, Oral, Daily   linagliptin  (TRADJENTA ) 5 mg, Oral, Daily   methocarbamol  (ROBAXIN ) 500 mg, Oral, At bedtime PRN   potassium chloride  SA (KLOR-CON  M) 20 MEQ tablet 20 mEq, Oral, 2 times daily   spironolactone  (ALDACTONE ) 50 mg, Oral, Daily   Vitamin D   (Ergocalciferol ) (DRISDOL ) 50,000 Units, Oral, Every 7 days    Diet Orders (From admission, onward)     Start     Ordered   10/20/24 1136  Diet Heart Room service appropriate? Yes; Fluid consistency: Thin  Diet effective now       Question Answer Comment  Room service appropriate? Yes   Fluid consistency: Thin      10/20/24 1135            DVT prophylaxis: heparin  injection 5,000 Units Start: 10/21/24 0600   Lab Results  Component Value Date   PLT 146 (L) 10/21/2024      Code Status: Full Code  Family Communication: no family at bedside   Status is: Inpatient Remains inpatient appropriate because: severity of illness   Level of care: Progressive  Consultants:  Cardiology  Objective: Vitals:   10/21/24 0400 10/21/24 0430 10/21/24 0519 10/21/24 0801  BP: (!) 109/53 (!) 96/53  127/69  Pulse: 75 75  81  Resp: 17 17 19 15   Temp:    98.1 F (36.7 C)  TempSrc:      SpO2: 95% 95%  97%  Weight:   73.3 kg     Intake/Output Summary (Last 24 hours) at 10/21/2024 1107 Last data filed at 10/21/2024 0827 Gross per 24 hour  Intake 740 ml  Output --  Net 740 ml   Wt Readings from Last 3 Encounters:  10/21/24 73.3 kg  10/12/24 72.9 kg  08/07/24 72.1 kg    Examination:  Constitutional: NAD Eyes: no scleral icterus ENMT: Mucous membranes are moist.  Neck: normal, supple Respiratory: clear to auscultation bilaterally, no wheezing, no crackles.  Cardiovascular: Regular rate and rhythm, no murmurs / rubs / gallops. No LE edema.  Abdomen: non distended, no tenderness. Bowel sounds positive.  Musculoskeletal: no clubbing / cyanosis.   Data Reviewed: I have independently reviewed following labs and imaging studies   CBC Recent Labs  Lab 10/17/24 0858 10/18/24 0234 10/19/24 0253 10/20/24 0313 10/20/24 1050 10/20/24 1054 10/20/24 1055 10/20/24 1100 10/21/24 0328  WBC 9.7 13.1* 10.8* 10.7*  --   --   --   --  11.4*  HGB 12.0 13.1 13.3 13.0 11.9* 11.9*  11.6* 12.2 11.2*  HCT 37.8 41.2 41.5 39.6 35.0* 35.0* 34.0* 36.0 34.2*  PLT 181 190 202 181  --   --   --   --  146*  MCV 90.4 90.4 89.1 87.8  --   --   --   --  87.7  MCH 28.7 28.7 28.5 28.8  --   --   --   --  28.7  MCHC 31.7 31.8 32.0 32.8  --   --   --   --  32.7  RDW 13.1 13.2 13.2 13.2  --   --   --   --  13.5    Recent Labs  Lab 10/16/24 1931 10/16/24 2338 10/17/24 0046 10/17/24 0122 10/17/24 0256 10/17/24 0328 10/17/24 0622 10/18/24 0234 10/18/24 1912 10/19/24 0253 10/20/24 0313 10/20/24 1050 10/20/24 1054 10/20/24 1055 10/20/24 1100 10/21/24 0328  NA 140  --    < >  --    < >  --   --  136 130* 130* 134* 136 137 134* 136 134*  K 4.2  --    < >  --    < >  --   --  4.6 4.0 4.8 4.2 3.8 3.9 3.9 3.9 4.1  CL 102  --   --   --    < >  --   --  102 96* 98 104  --   --   --   --  105  CO2 21*  --   --   --    < >  --   --  18* 21* 22 20*  --   --   --   --  17*  GLUCOSE 263*  --   --   --    < >  --   --  198* 241* 279* 166*  --   --   --   --  120*  BUN 36*  --   --   --    < >  --   --  43* 53* 58* 58*  --   --   --   --  54*  CREATININE 1.61*  --   --   --    < >  --   --  2.10* 2.49* 2.64* 2.44*  --   --   --   --  2.11*  CALCIUM  10.0  --   --   --    < >  --   --  9.2 8.4* 8.7* 8.5*  --   --   --   --  8.4*  AST 17  --   --   --   --   --   --   --  21  --   --   --   --   --   --   --   ALT 9  --   --   --   --   --   --   --  13  --   --   --   --   --   --   --   ALKPHOS 142*  --   --   --   --   --   --   --  92  --   --   --   --   --   --   --   BILITOT 0.6  --   --   --   --   --   --   --  0.8  --   --   --   --   --   --   --   ALBUMIN 4.4  --   --   --   --   --   --   --  3.2*  --   --   --   --   --   --   --   MG  --   --   --   --   --   --  2.0  --   --  2.0  --   --   --   --   --   --   PROCALCITON  --   --   --   --   --   --  0.10  --   --   --   --   --   --   --   --   --  LATICACIDVEN  --  2.2*  --  2.2*  --  1.8  --   --   --   --   --   --   --    --   --   --    < > = values in this interval not displayed.    ------------------------------------------------------------------------------------------------------------------ Recent Labs    10/19/24 0253  CHOL 115  HDL 48  LDLCALC 55  TRIG 61  CHOLHDL 2.4    Lab Results  Component Value Date   HGBA1C 6.9 (H) 07/23/2024   ------------------------------------------------------------------------------------------------------------------ No results for input(s): TSH, T4TOTAL, T3FREE, THYROIDAB in the last 72 hours.  Invalid input(s): FREET3  Cardiac Enzymes No results for input(s): CKMB, TROPONINI, MYOGLOBIN in the last 168 hours.  Invalid input(s): CK ------------------------------------------------------------------------------------------------------------------    Component Value Date/Time   BNP 348.7 (H) 11/09/2022 1215   BNP 1,268.7 (H) 10/31/2022 0745    CBG: Recent Labs  Lab 10/20/24 1306 10/20/24 1704 10/20/24 2106 10/21/24 0618 10/21/24 1100  GLUCAP 150* 182* 177* 131* 183*    Recent Results (from the past 240 hours)  Resp panel by RT-PCR (RSV, Flu A&B, Covid) Anterior Nasal Swab     Status: None   Collection Time: 10/16/24  7:31 PM   Specimen: Anterior Nasal Swab  Result Value Ref Range Status   SARS Coronavirus 2 by RT PCR NEGATIVE NEGATIVE Final    Comment: (NOTE) SARS-CoV-2 target nucleic acids are NOT DETECTED.  The SARS-CoV-2 RNA is generally detectable in upper respiratory specimens during the acute phase of infection. The lowest concentration of SARS-CoV-2 viral copies this assay can detect is 138 copies/mL. A negative result does not preclude SARS-Cov-2 infection and should not be used as the sole basis for treatment or other patient management decisions. A negative result may occur with  improper specimen collection/handling, submission of specimen other than nasopharyngeal swab, presence of viral mutation(s) within  the areas targeted by this assay, and inadequate number of viral copies(<138 copies/mL). A negative result must be combined with clinical observations, patient history, and epidemiological information. The expected result is Negative.  Fact Sheet for Patients:  bloggercourse.com  Fact Sheet for Healthcare Providers:  seriousbroker.it  This test is no t yet approved or cleared by the [place]  FDA and  has been authorized for detection and/or diagnosis of SARS-CoV-2 by FDA under an Emergency Use Authorization (EUA). This EUA will remain  in effect (meaning this test can be used) for the duration of the COVID-19 declaration under Section 564(b)(1) of the Act, 21 U.S.C.section 360bbb-3(b)(1), unless the authorization is terminated  or revoked sooner.       Influenza A by PCR NEGATIVE NEGATIVE Final   Influenza B by PCR NEGATIVE NEGATIVE Final    Comment: (NOTE) The Xpert Xpress SARS-CoV-2/FLU/RSV plus assay is intended as an aid in the diagnosis of influenza from Nasopharyngeal swab specimens and should not be used as a sole basis for treatment. Nasal washings and aspirates are unacceptable for Xpert Xpress SARS-CoV-2/FLU/RSV testing.  Fact Sheet for Patients: bloggercourse.com  Fact Sheet for Healthcare Providers: seriousbroker.it  This test is not yet approved or cleared by the [place]  FDA and has been authorized for detection and/or diagnosis of SARS-CoV-2 by FDA under an Emergency Use Authorization (EUA). This EUA will remain in effect (meaning this test can be used) for the duration of the COVID-19 declaration under Section 564(b)(1) of the Act, 21 U.S.C. section 360bbb-3(b)(1), unless the authorization is terminated or revoked.  Resp Syncytial Virus by PCR NEGATIVE NEGATIVE Final    Comment: (NOTE) Fact Sheet for  Patients: bloggercourse.com  Fact Sheet for Healthcare Providers: seriousbroker.it  This test is not yet approved or cleared by the [place]  FDA and has been authorized for detection and/or diagnosis of SARS-CoV-2 by FDA under an Emergency Use Authorization (EUA). This EUA will remain in effect (meaning this test can be used) for the duration of the COVID-19 declaration under Section 564(b)(1) of the Act, 21 U.S.C. section 360bbb-3(b)(1), unless the authorization is terminated or revoked.  Performed at [facility], [address] [place], [place] [address]   Blood culture (routine x 2)     Status: None (Preliminary result)   Collection Time: 10/16/24 11:58 PM   Specimen: BLOOD  Result Value Ref Range Status   Specimen Description   Final    BLOOD RIGHT ANTECUBITAL Performed at [facility], [address], [place], [place] [address]    Special Requests   Final    BOTTLES DRAWN AEROBIC AND ANAEROBIC Blood Culture results may not be optimal due to an inadequate volume of blood received in culture bottles Performed at [facility], [address] [place], [place] [address]    Culture   Final    NO GROWTH 4 DAYS Performed at [facility], [address] [place], [place] [address]    Report Status PENDING  Incomplete  Blood culture (routine x 2)     Status: None (Preliminary result)   Collection Time: 10/16/24 11:58 PM   Specimen: BLOOD  Result Value Ref Range Status   Specimen Description   Final    BLOOD LEFT ANTECUBITAL Performed at [facility], [address], [place], [place] [address]    Special Requests   Final    BOTTLES DRAWN AEROBIC AND ANAEROBIC Blood Culture results may not be optimal due to an inadequate volume of blood received in culture bottles Performed at [facility], [address]  [place], [place] [address]    Culture   Final    NO GROWTH 4 DAYS Performed at [facility], [address], [place], [place] [address]    Report Status PENDING  Incomplete     Radiology Studies: No results found.    Nilda Fendt, MD, PhD [facility]  Between 7 am - 7 pm I am available, please contact me via Amion (for emergencies) or Securechat (non urgent messages)  Between 7 pm - 7 am I am not available, please contact night coverage MD/APP via Amion

## 2024-10-21 NOTE — Progress Notes (Addendum)
 Progress Note  Patient Name: Maria Foster Date of Encounter: 10/21/2024 [facility] Cardiologist: Annabella Scarce, MD   Interval Summary    Feels well this morning. Had some low BPs last evening and overnight.   Vital Signs Vitals:   10/21/24 0400 10/21/24 0430 10/21/24 0519 10/21/24 0801  BP: (!) 109/53 (!) 96/53  127/69  Pulse: 75 75  81  Resp: 17 17 19 15   Temp:    98.1 F (36.7 C)  TempSrc:      SpO2: 95% 95%  97%  Weight:   73.3 kg     Intake/Output Summary (Last 24 hours) at 10/21/2024 0958 Last data filed at 10/21/2024 0827 Gross per 24 hour  Intake 740 ml  Output --  Net 740 ml      10/21/2024    5:19 AM 10/20/2024    6:28 AM 10/19/2024    5:59 AM  Last 3 Weights  Weight (lbs) 161 lb 11.2 oz 158 lb 8.2 oz 160 lb 0.9 oz  Weight (kg) 73.347 kg 71.9 kg 72.6 kg      Telemetry/ECG   Sinus Rhythm - Personally Reviewed  Physical Exam  GEN: No acute distress.   Neck: No JVD Cardiac: RRR, no murmurs, rubs, or gallops.  Respiratory: Clear to auscultation bilaterally. GI: Soft, nontender, non-distended  MS: No edema VAS: right radial cath site stable   Assessment & Plan   68 year old woman with severe multivessel CAD and history of HFrEF with normalized systolic function on medical therapy, returns with complaints of abdominal pain nausea and vomiting and is found to have worsening left ventricular systolic function with regional wall motion abnormalities primarily involving the LAD distribution. Additional medical problems include DM type II, HTN, OSA (not on CPAP), CKD 3 AA, PAD, rheumatoid arthritis.   NSTEMI CAD Suspected Takotsubo cardiomyopathy -- Cardiac catheterization 2022 with known CTO of RCA and high-grade disease of OM and circumflex treated medically -- hsTn 676>>703 -- Echo 11/30 with LVEF of 40%, severe hypokinesis of the LV, mid apical inferior septal wall and inferior wall -- Cardiac catheterization 12/2 with CTO of RCA with  left-to-right collaterals filling via PDA, 100% occlusion of lateral branch of first OM, segmental 80% mid AV groove circumflex lesion.  Recommendations for continued medical therapy. -- Continue aspirin , atorvastatin  40mg  daily -- GDMT: BP soft overnight, on carvedilol  12.5 mg twice daily, reduce hydralazine  to 75 mg 3 times daily, Imdur  60mg  daily. Entresto /spiro held with AKI; intolerant of SGLT2i  PVCs -- now resolved on telemetry -- continue coreg  12.5mg  BID   AKI on CKD 3a -- Cr 1.3 on admission, peaked at 2.64, now improved to 2.1  Abdominal pain Nausea-vomiting -- CT with possible colitis, no SBO; improved   For questions or updates, please contact [facility] Please consult [email or web address] for contact info under   Signed, Manuelita Rummer, NP   ATTENDING ATTESTATION:  After conducting a review of all available clinical information with the care team, interviewing the patient, and performing a physical exam, I agree with the findings and plan described in this note.   GEN: No acute distress, AO x 3 HEENT:  MMM, no JVD, no scleral icterus Cardiac: RRR, no murmurs, rubs, or gallops.  Respiratory: Clear to auscultation bilaterally. GI: Soft, nontender, non-distended  MS: No edema; No deformity. Neuro:  Nonfocal  Vasc:  +2 radial pulses  Coronary angiography yesterday demonstrated unchanged burden of disease with CTO of the right coronary artery and high-grade obtuse marginal disease  with no significant LAD disease.  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  For this reason we are optimizing her regimen.  Continue Coreg  12.5 mg twice daily, hydralazine  75 mg 3 times daily, Imdur  60 mg daily.  Patient's creatinine is improving.  Will plan on restarting Entresto  and spironolactone  prior to discharge.  Patient is intolerant of SGLT2 inhibitors.  I am hopeful that patient can be discharged tomorrow with close hospital  follow-up.  Lurena Red, MD Pager [phone]

## 2024-10-21 NOTE — Plan of Care (Signed)
  Problem: Education: Goal: Knowledge of General Education information will improve Description: Including pain rating scale, medication(s)/side effects and non-pharmacologic comfort measures Outcome: Progressing   Problem: Clinical Measurements: Goal: Ability to maintain clinical measurements within normal limits will improve Outcome: Progressing Goal: Respiratory complications will improve Outcome: Progressing Goal: Cardiovascular complication will be avoided Outcome: Progressing   Problem: Activity: Goal: Risk for activity intolerance will decrease Outcome: Progressing   Problem: Coping: Goal: Level of anxiety will decrease Outcome: Progressing   Problem: Elimination: Goal: Will not experience complications related to bowel motility Outcome: Progressing Goal: Will not experience complications related to urinary retention Outcome: Progressing   Problem: Pain Managment: Goal: General experience of comfort will improve and/or be controlled Outcome: Progressing   Problem: Safety: Goal: Ability to remain free from injury will improve Outcome: Progressing   Problem: Skin Integrity: Goal: Risk for impaired skin integrity will decrease Outcome: Progressing

## 2024-10-21 NOTE — Plan of Care (Signed)
   Problem: Education: Goal: Knowledge of General Education information will improve Description: Including pain rating scale, medication(s)/side effects and non-pharmacologic comfort measures Outcome: Progressing   Problem: Clinical Measurements: Goal: Will remain free from infection Outcome: Progressing   Problem: Clinical Measurements: Goal: Respiratory complications will improve Outcome: Progressing

## 2024-10-22 ENCOUNTER — Other Ambulatory Visit (HOSPITAL_COMMUNITY): Payer: Self-pay

## 2024-10-22 DIAGNOSIS — I5033 Acute on chronic diastolic (congestive) heart failure: Secondary | ICD-10-CM | POA: Diagnosis not present

## 2024-10-22 LAB — GLUCOSE, CAPILLARY
Glucose-Capillary: 113 mg/dL — ABNORMAL HIGH (ref 70–99)
Glucose-Capillary: 164 mg/dL — ABNORMAL HIGH (ref 70–99)

## 2024-10-22 LAB — CULTURE, BLOOD (ROUTINE X 2)
Culture: NO GROWTH
Culture: NO GROWTH

## 2024-10-22 LAB — COMPREHENSIVE METABOLIC PANEL WITH GFR
ALT: 13 U/L (ref 0–44)
AST: 13 U/L — ABNORMAL LOW (ref 15–41)
Albumin: 2.4 g/dL — ABNORMAL LOW (ref 3.5–5.0)
Alkaline Phosphatase: 59 U/L (ref 38–126)
Anion gap: 10 (ref 5–15)
BUN: 48 mg/dL — ABNORMAL HIGH (ref 8–23)
CO2: 17 mmol/L — ABNORMAL LOW (ref 22–32)
Calcium: 7.9 mg/dL — ABNORMAL LOW (ref 8.9–10.3)
Chloride: 105 mmol/L (ref 98–111)
Creatinine, Ser: 1.92 mg/dL — ABNORMAL HIGH (ref 0.44–1.00)
GFR, Estimated: 28 mL/min — ABNORMAL LOW (ref >60)
Glucose, Bld: 123 mg/dL — ABNORMAL HIGH (ref 70–99)
Potassium: 3.7 mmol/L (ref 3.5–5.1)
Sodium: 132 mmol/L — ABNORMAL LOW (ref 135–145)
Total Bilirubin: 1 mg/dL (ref 0.0–1.2)
Total Protein: 5.6 g/dL — ABNORMAL LOW (ref 6.5–8.1)

## 2024-10-22 LAB — CBC
HCT: 31.3 % — ABNORMAL LOW (ref 36.0–46.0)
Hemoglobin: 10.3 g/dL — ABNORMAL LOW (ref 12.0–15.0)
MCH: 28.8 pg (ref 26.0–34.0)
MCHC: 32.9 g/dL (ref 30.0–36.0)
MCV: 87.4 fL (ref 80.0–100.0)
Platelets: 138 K/uL — ABNORMAL LOW (ref 150–400)
RBC: 3.58 MIL/uL — ABNORMAL LOW (ref 3.87–5.11)
RDW: 13.6 % (ref 11.5–15.5)
WBC: 12.7 K/uL — ABNORMAL HIGH (ref 4.0–10.5)
nRBC: 0 % (ref 0.0–0.2)

## 2024-10-22 LAB — LIPOPROTEIN A (LPA): Lipoprotein (a): 132.5 nmol/L — ABNORMAL HIGH (ref <75.0)

## 2024-10-22 LAB — MAGNESIUM: Magnesium: 2.1 mg/dL (ref 1.7–2.4)

## 2024-10-22 MED ORDER — HYDRALAZINE HCL 25 MG PO TABS
75.0000 mg | ORAL_TABLET | Freq: Three times a day (TID) | ORAL | 0 refills | Status: DC
  Filled 2024-10-22: qty 270, 30d supply, fill #0

## 2024-10-22 MED ORDER — AMOXICILLIN-POT CLAVULANATE 500-125 MG PO TABS
1.0000 | ORAL_TABLET | Freq: Two times a day (BID) | ORAL | 0 refills | Status: AC
  Filled 2024-10-22: qty 4, 2d supply, fill #0

## 2024-10-22 NOTE — Progress Notes (Signed)
 Discharge   Patient expressed verbal understanding of discharge POC.   Patient given time to ask any questions.  Additional education included in AVS.  Alert oriented in good spirits.   Tele and PIV removed. CCMD/Dwayne. Pressure dressings intact.    Transferring to lounge TOC meds in process.  Ride on the way.

## 2024-10-22 NOTE — Plan of Care (Signed)
  Problem: Education: Goal: Knowledge of General Education information will improve Description: Including pain rating scale, medication(s)/side effects and non-pharmacologic comfort measures Outcome: Progressing   Problem: Clinical Measurements: Goal: Ability to maintain clinical measurements within normal limits will improve Outcome: Progressing Goal: Diagnostic test results will improve Outcome: Progressing Goal: Respiratory complications will improve Outcome: Progressing Goal: Cardiovascular complication will be avoided Outcome: Progressing   Problem: Activity: Goal: Risk for activity intolerance will decrease Outcome: Progressing   Problem: Nutrition: Goal: Adequate nutrition will be maintained Outcome: Progressing   Problem: Coping: Goal: Level of anxiety will decrease Outcome: Progressing   Problem: Elimination: Goal: Will not experience complications related to urinary retention Outcome: Progressing   Problem: Pain Managment: Goal: General experience of comfort will improve and/or be controlled Outcome: Progressing   Problem: Safety: Goal: Ability to remain free from injury will improve Outcome: Progressing   Problem: Skin Integrity: Goal: Risk for impaired skin integrity will decrease Outcome: Progressing   Problem: Elimination: Goal: Will not experience complications related to bowel motility Outcome: Not Progressing Note: Pt still having incontinent episodes of type 7 stool related to ABX usage.

## 2024-10-22 NOTE — TOC Transition Note (Signed)
 Transition of Care (TOC) - Discharge Note Rayfield Gobble RN, BSN Inpatient Care Management Unit 4E- RN Case Manager See Treatment Team for direct phone #   Patient Details  Name: Maria Foster MRN: [id number] Date of Birth: 1956/11/08  Transition [facility]) CM/SW Contact:  Gobble Rayfield Hurst, RN Phone Number: 10/22/2024, 1:05 PM   Clinical Narrative:    Pt stable for transition home today, No HH or DME needs noted. Has ride that is coming to transport home, meds being filled at [facility] pharmacy to pick up prior to leaving.   No IP CM needs noted.    Final next level of care: Home/Self Care Barriers to Discharge: No Barriers Identified   Patient Goals and CMS Choice Patient states their goals for this hospitalization and ongoing recovery are:: return home   Choice offered to / list presented to : NA      Discharge Placement               Home        Discharge Plan and Services Additional resources added to the After Visit Summary for     Discharge Planning Services: CM Consult Post Acute Care Choice: NA          DME Arranged: N/A DME Agency: NA       HH Arranged: NA HH Agency: NA        Social Drivers of Health (SDOH) Interventions SDOH Screenings   Food Insecurity: No Food Insecurity (10/17/2024)  Housing: Low Risk  (10/19/2024)  Transportation Needs: No Transportation Needs (10/17/2024)  Utilities: Not At Risk (10/17/2024)  Alcohol Screen: Low Risk  (12/10/2023)  Depression (PHQ2-9): Low Risk  (08/07/2024)  Financial Resource Strain: Low Risk  (12/10/2023)  Physical Activity: Insufficiently Active (12/10/2023)  Social Connections: Moderately Integrated (10/19/2024)  Stress: No Stress Concern Present (12/10/2023)  Tobacco Use: Medium Risk (10/17/2024)  Health Literacy: Adequate Health Literacy (07/24/2024)     Readmission Risk Interventions    10/22/2024    1:05 PM  Readmission Risk Prevention Plan  Transportation Screening Complete  HRI or  Home Care Consult Complete  Social Work Consult for Recovery Care Planning/Counseling Complete  Palliative Care Screening Not Applicable  Medication Review Oceanographer) Complete

## 2024-10-22 NOTE — Progress Notes (Signed)
 Mobility Specialist Progress Note;   10/22/24 1033  Mobility  Activity Ambulated with assistance  Level of Assistance Standby assist, set-up cues, supervision of patient - no hands on  Assistive Device Front wheel walker  Distance Ambulated (ft) 200 ft  Activity Response Tolerated well  Mobility Referral Yes  Mobility visit 1 Mobility  Mobility Specialist Start Time (ACUTE ONLY) 1033  Mobility Specialist Stop Time (ACUTE ONLY) 1042  Mobility Specialist Time Calculation (min) (ACUTE ONLY) 9 min   Pt eager for mobility. Required no physical assistance during ambulation, SV for safety. VSS on RA throughout and no c/o when asked. States she is feeling much better today. Pt returned back to bed and left with all needs met. Speaking w/ MD.   Lauraine Erm Mobility Specialist Please contact via SecureChat or Rehab Office [phone]

## 2024-10-22 NOTE — Discharge Summary (Signed)
 Physician Discharge Summary  Maria Foster FMW:[id number] DOB: 21-Jul-1956 DOA: 10/16/2024  PCP: Bulah Alm RAMAN, PA-C  Admit date: 10/16/2024 Discharge date: 10/22/2024  Admitted From: home Disposition:  home  Recommendations for Outpatient Follow-up:  Follow up with PCP in 1-2 weeks Please obtain BMP/CBC in one week Consider resumption of Entresto  and diuretics at the next visit based on renal function and fluid status  Home Health: none Equipment/Devices: none  Discharge Condition: stable CODE STATUS: Full code Diet Orders (From admission, onward)     Start     Ordered   10/20/24 1136  Diet Heart Room service appropriate? Yes; Fluid consistency: Thin  Diet effective now       Question Answer Comment  Room service appropriate? Yes   Fluid consistency: Thin      10/20/24 1135           Brief Narrative / Interim history: 68 year old female with DM2, HTN, OSA intolerant of CPAP, RA, CKD 3A, chronic systolic CHF who comes into the hospital with nausea, vomiting, fatigue.  She was feeling well up until her Thanksgiving dinner next morning she woke up with multiple episodes of vomiting.  She continues to feel sick, and eventually presented to the ER.  There were initial concerns about sepsis based on elevated lactic acid, was given a lot of fluids and then developed hypoxic respiratory failure.  CT angiogram was negative for PE but was concern for pulmonary edema.  Hospital Course / Discharge diagnoses: Principal problem Acute on chronic combined CHF, acute hypoxemia -patient is admitted to the hospital with increased work of breathing, she was initially placed on IV Lasix  but developed acute kidney injury and this was on hold.  She underwent a 2D echocardiogram which showed LVEF 40% with RWMA left ventricle, apical anteroseptal and anterior wall.  Cardiology consulted and followed patient while hospitalized.  Due to elevated troponin and concern for non-STEMI, she underwent  a left and right heart cath.  Right heart cath 12/2 actually showed euvolemia at the time of the study. Continue medications as per cardiology, currently on Coreg , hydralazine , Imdur .  Due to AKI, cardiology recommends holding Entresto  and diuretics.   Active problems CAD, elevated troponin, non-STEMI -started initially on aspirin  and heparin , underwent cardiac catheterization 12/2 which showed angiographically stable triple-vessel disease, ongoing medical management was recommended.  She is to resume her home medications Colitis, nausea, vomiting-CT scan of the abdomen showed colitis at the splenic flexure.  Has been started on Augmentin, complete 7-day course, 2 days remaining.  Follow-up with PCP and GI for potential colonoscopy once acute episode resolves Acute kidney injury on CKD 3A -creatinine was 1.4 on admission, peaked at 2.6 after IV Lasix .  Diuresis was held, creatinine currently gradually improving.  Hold Entresto /diuretics per cardiology, recommend outpatient follow-up within a week for repeat blood work and consideration for resumption DM2-continue home medications Essential hypertension-continue Coreg , hydralazine  as below  Sepsis ruled out   Discharge Instructions  Discharge Instructions     AMB referral to Phase II Cardiac Rehabilitation   Complete by: As directed    Diagnosis:  Heart Failure (see criteria below if ordering Phase II) Type II MI     Heart Failure Type: Chronic Systolic & Diastolic   After initial evaluation and assessments completed: Virtual Based Care may be provided alone or in conjunction with Phase 2 Cardiac Rehab based on patient barriers.: Yes   Intensive Cardiac Rehabilitation (ICR) [facility] location only OR Traditional Cardiac Rehabilitation (TCR) *If criteria  for ICR are not met will enroll in TCR ([facility] only): Yes      Allergies as of 10/22/2024       Reactions   Metformin  And Related Other (See Comments)   Kidney injury, GI upset, diarrhea    Amoxicillin  Diarrhea   Farxiga  [dapagliflozin ] Other (See Comments)   Yeast infections, recurrent        Medication List     STOP taking these medications    Entresto  97-103 MG Generic drug: sacubitril -valsartan    furosemide  40 MG tablet Commonly known as: LASIX    potassium chloride  SA 20 MEQ tablet Commonly known as: KLOR-CON  M   spironolactone  50 MG tablet Commonly known as: ALDACTONE        TAKE these medications    amoxicillin -clavulanate 500-125 MG tablet Commonly known as: AUGMENTIN Take 1 tablet by mouth every 12 (twelve) hours for 2 days.   aspirin  EC 81 MG tablet Take 1 tablet (81 mg total) by mouth daily.   atorvastatin  40 MG tablet Commonly known as: LIPITOR Take 1 tablet (40 mg total) by mouth daily.   carvedilol  12.5 MG tablet Commonly known as: COREG  Take 1 tablet (12.5 mg total) by mouth 2 (two) times daily.   ferrous gluconate  324 MG tablet Commonly known as: FERGON Take 1 tablet (324 mg total) by mouth 2 (two) times daily with a meal.   glipiZIDE  5 MG tablet Commonly known as: GLUCOTROL  Take 1 tablet (5 mg total) by mouth 2 (two) times daily before a meal.   hydrALAZINE  25 MG tablet Commonly known as: APRESOLINE  Take 3 tablets (75 mg total) by mouth 3 (three) times daily. What changed:  medication strength how much to take   isosorbide  mononitrate 60 MG 24 hr tablet Commonly known as: IMDUR  Take 1 tablet (60 mg total) by mouth daily.   latanoprost 0.005 % ophthalmic solution Commonly known as: XALATAN Place 1 drop into both eyes at bedtime.   leflunomide  20 MG tablet Commonly known as: ARAVA  Take 1 tablet (20 mg total) by mouth daily.   linagliptin  5 MG Tabs tablet Commonly known as: Tradjenta  Take 1 tablet (5 mg total) by mouth daily.   methocarbamol  500 MG tablet Commonly known as: ROBAXIN  Take 1 tablet (500 mg total) by mouth at bedtime as needed for muscle spasms.   Vitamin D  (Ergocalciferol ) 1.25 MG (50000 UNIT) Caps  capsule Commonly known as: DRISDOL  Take 1 capsule (50,000 Units total) by mouth every 7 (seven) days.       Consultations: Cardiology   Procedures/Studies:  CARDIAC CATHETERIZATION Result Date: 10/20/2024 Table formatting from the original result was not included. Images from the original result were not included.   Ost RCA to Prox RCA lesion is 90% stenosed.   Prox RCA to Dist RCA lesion is 100% stenosed.   Mid LM to Ost LAD lesion is 25% stenosed.   Prox LAD lesion is 20% stenosed.   1st Mrg lesion is 60% stenosed.   Lat 1st Mrg lesion is 100% stenosed.   Prox Cx to Dist Cx lesion is 80% stenosed.   Hemodynamic findings consistent with mild pulmonary hypertension.   There is no aortic valve stenosis.   Anticipated discharge date to be determined.   Recommend Aspirin  81mg  daily for moderate CAD. Dominance: Co-dominant   Angiographically Stable Three-Vessel Disease: - 100% RCA CTO with left-to-right collaterals filling the PDA - 100% lateral branch of 1st Mrg (OM1) - Segmental 80% mid/AV groove LCx - Minimal disease in large RI and LAD.  Mild Pulmonary Venous Hypertension with mean PAP of 26 mmHg and PCWP of 20 mmHg, LVEDP of 16 mmHg. Fick Cardiac Output and Index 3.68-2.09-moderately reduced.  RECOMMENDATIONS Continue to titrate GDMT for combined systolic and diastolic heart failure as well as diffuse CAD Appears to be almost euvolemic based on her right heart cath numbers, No new changes to explain reduced EF. Alm Clay, MD  CT ABDOMEN PELVIS WO CONTRAST Result Date: 10/18/2024 CLINICAL DATA:  Bowel obstruction EXAM: CT ABDOMEN AND PELVIS WITHOUT CONTRAST TECHNIQUE: Multidetector CT imaging of the abdomen and pelvis was performed following the standard protocol without IV contrast. RADIATION DOSE REDUCTION: This exam was performed according to the departmental dose-optimization program which includes automated exposure control, adjustment of the mA and/or kV according to patient size and/or use  of iterative reconstruction technique. COMPARISON:  CT abdomen and pelvis dated 10/29/2022 FINDINGS: Lower chest: Slightly decreased bibasilar ground-glass opacities and consolidations. No pleural effusion or pneumothorax demonstrated. Partially imaged heart size is normal. Hepatobiliary: No focal hepatic lesions. No intra or extrahepatic biliary ductal dilation. Vicariously excreted contrast material within the gallbladder. Pancreas: No focal lesions or main ductal dilation. Spleen: Normal in size without focal abnormality. Adrenals/Urinary Tract: No adrenal nodules. No suspicious renal mass, calculi or hydronephrosis. Excreted contrast material within the urinary bladder. Stomach/Bowel: Normal appearance of the stomach. Enteric contrast material reaches the level of the distal transverse colon. Mild focal mural thickening of the underdistended splenic flexure. Redundant sigmoid colon. Moderate volume stool in the rectum. Normal appendix. Vascular/Lymphatic: Aortic atherosclerosis. No enlarged abdominal or pelvic lymph nodes. Reproductive: No adnexal masses. Other: No free fluid, fluid collection, or free air. Musculoskeletal: No acute or abnormal lytic or blastic osseous lesions. Multilevel degenerative changes of the partially imaged thoracic and lumbar spine. IMPRESSION: 1. No evidence of bowel obstruction. Enteric contrast material reaches the level of the distal transverse colon. 2. Mild focal mural thickening of the underdistended splenic flexure, which may be due to underdistention or mild colitis. Consider correlation with colonoscopy to exclude underlying mass lesion in this area if one has not been performed recently. 3. Slightly decreased bibasilar ground-glass opacities and consolidations. 4.  Aortic Atherosclerosis (ICD10-I70.0). Electronically Signed   By: Limin  Xu M.D.   On: 10/18/2024 19:17   ECHOCARDIOGRAM LIMITED Result Date: 10/18/2024    ECHOCARDIOGRAM LIMITED REPORT   Patient Name:    Maria Foster Date of Exam: 10/18/2024 Medical Rec #:  [id number]       Height:       63.0 in Accession #:    [id number]      Weight:       156.2 lb Date of Birth:  Jul 06, 1956       BSA:          1.741 m Patient Age:    68 years        BP:           144/72 mmHg Patient Gender: F               HR:           87 bpm. Exam Location:  Inpatient Procedure: Limited Echo and Intracardiac Opacification Agent (Both Spectral and            Color Flow Doppler were utilized during procedure). Indications:    acute systolic chf  History:        Patient has prior history of Echocardiogram examinations, most  recent 10/17/2024. Chronic kidney disease,                 Signs/Symptoms:elevated troponin; Risk Factors:Diabetes,                 Hypertension, Sleep Apnea, Dyslipidemia and Former Smoker.  Sonographer:    Tinnie Barefoot RDCS Referring Phys: [id number] TRACI R TURNER IMPRESSIONS  1. Limited study fro LV wall motion after Definity contrast.  2. There is no left ventricular thrombus. Left ventricular ejection fraction, by estimation, is 40%. The left ventricle has mild to moderately decreased function. There is moderate hypokinesis of the left ventricular, mid-apical anteroseptal wall and anterior wall. There is severe hypokinesis of the left ventricular, mid-apical inferoseptal wall and inferior wall. FINDINGS  Left Ventricle: There is no left ventricular thrombus. Left ventricular ejection fraction, by estimation, is 40%. The left ventricle has mild to moderately decreased function. Moderate hypokinesis of the left ventricular, mid-apical anteroseptal wall and anterior wall. Severe hypokinesis of the left ventricular, mid-apical inferoseptal wall and inferior wall. Definity contrast agent was given IV to delineate the left ventricular endocardial borders.  LV Wall Scoring: The mid and distal anterior wall, mid and distal anterior septum, mid and distal inferior wall, mid inferolateral segment, mid inferoseptal  segment, and apex are hypokinetic. The antero-lateral wall, basal anteroseptal segment, basal inferolateral segment, apical lateral segment, basal anterior segment, basal inferior segment, and basal inferoseptal segment are normal.  LV Volumes (MOD) LV vol d, MOD A2C: 123.3 ml LV vol d, MOD A4C: 130.4 ml LV vol s, MOD A2C: 73.1 ml LV vol s, MOD A4C: 78.6 ml LV SV MOD A2C:     50.2 ml LV SV MOD A4C:     130.4 ml LV SV MOD BP:      52.3 ml Jerel Croitoru MD Electronically signed by Jerel Balding MD Signature Date/Time: 10/18/2024/3:12:29 PM    Final    ECHOCARDIOGRAM LIMITED Result Date: 10/17/2024    ECHOCARDIOGRAM LIMITED REPORT   Patient Name:   Maria Foster Date of Exam: 10/17/2024 Medical Rec #:  [id number]       Height:       63.0 in Accession #:    [id number]      Weight:       157.4 lb Date of Birth:  1955/12/02       BSA:          1.747 m Patient Age:    68 years        BP:           163/82 mmHg Patient Gender: F               HR:           86 bpm. Exam Location:  Inpatient Procedure: Limited Echo, Cardiac Doppler and Color Doppler (Both Spectral and            Color Flow Doppler were utilized during procedure). Indications:    CHF- Acute Diastolic I50.31, Elevated Troponin  History:        Patient has prior history of Echocardiogram examinations, most                 recent 07/04/2024. CHF; Risk Factors:Hypertension, Sleep Apnea,                 Diabetes, Former Smoker and elevated troponin.  Sonographer:    Koleen Popper RDCS Referring Phys: [id number] TIMOTHY S OPYD IMPRESSIONS  1. The left ventricle demonstrates regional  wall motion abnormalities (see scoring diagram/findings for description). There is mild left ventricular hypertrophy of the basal-septal segment. Left ventricular diastolic function could not be evaluated. There is severe hypokinesis of the left ventricular, mid-apical anteroseptal wall, inferoseptal wall, anterior wall and inferior wall. There is severe hypokinesis of the left  ventricular, apical segment.  2. Right ventricular systolic function was not well visualized. Tricuspid regurgitation signal is inadequate for assessing PA pressure.  3. Mild to moderate mitral valve regurgitation.  4. The aortic valve is tricuspid. Aortic valve regurgitation is mild to moderate. Aortic valve sclerosis/calcification is present, without any evidence of aortic stenosis.  5. Recommend repeat limited study wth definity contrast to assess EF and wall motion abnormalities further FINDINGS  Left Ventricle: The left ventricle demonstrates regional wall motion abnormalities. Severe hypokinesis of the left ventricular, mid-apical anteroseptal wall, inferoseptal wall, anterior wall and inferior wall. There is mild left ventricular hypertrophy of the basal-septal segment. Left ventricular diastolic function could not be evaluated. Right Ventricle: Right ventricular systolic function was not well visualized. Tricuspid regurgitation signal is inadequate for assessing PA pressure. Mitral Valve: Mild to moderate mitral valve regurgitation. Tricuspid Valve: Tricuspid valve regurgitation is trivial. Aortic Valve: The aortic valve is tricuspid. Aortic valve regurgitation is mild to moderate. Aortic valve sclerosis/calcification is present, without any evidence of aortic stenosis. Additional Comments: Spectral Doppler performed. Color Doppler performed.  LEFT VENTRICLE PLAX 2D LVIDd:         4.70 cm      Diastology LVIDs:         3.50 cm      LV e' medial:  4.35 cm/s LV PW:         0.90 cm      LV e' lateral: 5.77 cm/s LV IVS:        1.20 cm LVOT diam:     1.90 cm LV SV:         57 LV SV Index:   33 LVOT Area:     2.84 cm  LV Volumes (MOD) LV vol d, MOD A4C: 148.0 ml LV vol s, MOD A4C: 75.4 ml LV SV MOD A4C:     148.0 ml RIGHT VENTRICLE         IVC TAPSE (M-mode): 2.5 cm  IVC diam: 1.50 cm LEFT ATRIUM           Index LA diam:      3.70 cm 2.12 cm/m LA Vol (A4C): 30.2 ml 17.29 ml/m  AORTIC VALVE LVOT Vmax:   91.20  cm/s LVOT Vmean:  59.700 cm/s LVOT VTI:    0.201 m  AORTA Ao Root diam: 2.70 cm MR Peak grad: 119.2 mmHg MR Vmax:      546.00 cm/s SHUNTS                           Systemic VTI:  0.20 m                           Systemic Diam: 1.90 cm Wilbert Bihari MD Electronically signed by Wilbert Bihari MD Signature Date/Time: 10/17/2024/4:24:43 PM    Final    CT Angio Chest PE W and/or Wo Contrast Result Date: 10/17/2024 EXAM: CTA CHEST 10/17/2024 01:12:53 AM TECHNIQUE: CTA of the chest was performed without and with the administration of 75 mL of iohexol  (OMNIPAQUE ) 350 MG/ML injection. Multiplanar reformatted images are provided for review. MIP images are provided  for review. Automated exposure control, iterative reconstruction, and/or weight based adjustment of the mA/kV was utilized to reduce the radiation dose to as low as reasonably achievable. COMPARISON: Comparison with 07/03/2024. CLINICAL HISTORY: Pulmonary embolism (PE) suspected, high prob. FINDINGS: PULMONARY ARTERIES: Pulmonary arteries are adequately opacified for evaluation. No acute pulmonary embolus. Main pulmonary artery is normal in caliber. MEDIASTINUM: Coronary artery atherosclerotic calcification. Aortic atherosclerotic calcification. There is no acute abnormality of the thoracic aorta. The pericardium demonstrates no acute abnormality. LYMPH NODES: No mediastinal, hilar or axillary lymphadenopathy. LUNGS AND PLEURA: Diffuse bronchial wall thickening and interlobular septal thickening in both lungs greater on the right. Patchy nodular ground glass opacities in the right middle lobe with more confluent consolidation in the right lower lobe. Ground glass opacities in the left lower lobe. Findings have progressed compared to 07/03/2024. Small right pleural effusion. Similar to prior. No pneumothorax. UPPER ABDOMEN: Limited images of the upper abdomen are unremarkable. SOFT TISSUES AND BONES: Fusion of anterior osteophytes in the mid and lower thoracic  spine. No acute soft tissue abnormality. IMPRESSION: 1. No pulmonary embolism. 2. Findings favor worsening pulmonary edema superimposed on background of emphysema when compared with 8 / 15 / 25. Multifocal pneumonia could appear similarly. 3. Small right pleural effusion, similar to prior. Electronically signed by: Norman Gatlin MD 10/17/2024 01:25 AM EST RP Workstation: [id number]   DG Chest Portable 1 View Result Date: 10/16/2024 CLINICAL DATA:  Chest pain EXAM: PORTABLE CHEST 1 VIEW COMPARISON:  Chest x-ray 07/04/2024.  CT of the chest 07/03/2024. FINDINGS: There are diffuse widespread interstitial opacities throughout both lungs, right greater than left. There are additional mild patchy airspace opacities scattered throughout the right lung. The costophrenic angles are clear. The heart is mildly enlarged, unchanged. No pneumothorax or acute fracture. IMPRESSION: Diffuse widespread interstitial opacities throughout both lungs, right greater than left. There are additional mild patchy airspace opacities scattered throughout the right lung. Findings are concerning for multifocal pneumonia. Edema is not excluded. Electronically Signed   By: Greig Pique M.D.   On: 10/16/2024 23:54     Subjective: - no chest pain, shortness of breath, no abdominal pain, nausea or vomiting.   Discharge Exam: BP 130/65 (BP Location: Left Arm)   Pulse 86   Temp 99.2 F (37.3 C) (Oral)   Resp 14   Wt 72.8 kg   SpO2 94%   BMI 28.43 kg/m   General: Pt is alert, awake, not in acute distress Cardiovascular: RRR, S1/S2 +, no rubs, no gallops Respiratory: CTA bilaterally, no wheezing, no rhonchi Abdominal: Soft, NT, ND, bowel sounds + Extremities: no edema, no cyanosis    The results of significant diagnostics from this hospitalization (including imaging, microbiology, ancillary and laboratory) are listed below for reference.     Microbiology: Recent Results (from the past 240 hours)  Resp panel by RT-PCR  (RSV, Flu A&B, Covid) Anterior Nasal Swab     Status: None   Collection Time: 10/16/24  7:31 PM   Specimen: Anterior Nasal Swab  Result Value Ref Range Status   SARS Coronavirus 2 by RT PCR NEGATIVE NEGATIVE Final    Comment: (NOTE) SARS-CoV-2 target nucleic acids are NOT DETECTED.  The SARS-CoV-2 RNA is generally detectable in upper respiratory specimens during the acute phase of infection. The lowest concentration of SARS-CoV-2 viral copies this assay can detect is 138 copies/mL. A negative result does not preclude SARS-Cov-2 infection and should not be used as the sole basis for treatment or other patient management decisions.  A negative result may occur with  improper specimen collection/handling, submission of specimen other than nasopharyngeal swab, presence of viral mutation(s) within the areas targeted by this assay, and inadequate number of viral copies(<138 copies/mL). A negative result must be combined with clinical observations, patient history, and epidemiological information. The expected result is Negative.  Fact Sheet for Patients:  bloggercourse.com  Fact Sheet for Healthcare Providers:  seriousbroker.it  This test is no t yet approved or cleared by the [place]  FDA and  has been authorized for detection and/or diagnosis of SARS-CoV-2 by FDA under an Emergency Use Authorization (EUA). This EUA will remain  in effect (meaning this test can be used) for the duration of the COVID-19 declaration under Section 564(b)(1) of the Act, 21 U.S.C.section 360bbb-3(b)(1), unless the authorization is terminated  or revoked sooner.       Influenza A by PCR NEGATIVE NEGATIVE Final   Influenza B by PCR NEGATIVE NEGATIVE Final    Comment: (NOTE) The Xpert Xpress SARS-CoV-2/FLU/RSV plus assay is intended as an aid in the diagnosis of influenza from Nasopharyngeal swab specimens and should not be used as a sole basis for  treatment. Nasal washings and aspirates are unacceptable for Xpert Xpress SARS-CoV-2/FLU/RSV testing.  Fact Sheet for Patients: bloggercourse.com  Fact Sheet for Healthcare Providers: seriousbroker.it  This test is not yet approved or cleared by the [place]  FDA and has been authorized for detection and/or diagnosis of SARS-CoV-2 by FDA under an Emergency Use Authorization (EUA). This EUA will remain in effect (meaning this test can be used) for the duration of the COVID-19 declaration under Section 564(b)(1) of the Act, 21 U.S.C. section 360bbb-3(b)(1), unless the authorization is terminated or revoked.     Resp Syncytial Virus by PCR NEGATIVE NEGATIVE Final    Comment: (NOTE) Fact Sheet for Patients: bloggercourse.com  Fact Sheet for Healthcare Providers: seriousbroker.it  This test is not yet approved or cleared by the [place]  FDA and has been authorized for detection and/or diagnosis of SARS-CoV-2 by FDA under an Emergency Use Authorization (EUA). This EUA will remain in effect (meaning this test can be used) for the duration of the COVID-19 declaration under Section 564(b)(1) of the Act, 21 U.S.C. section 360bbb-3(b)(1), unless the authorization is terminated or revoked.  Performed at [facility], [address] [place], [place] [address]   Blood culture (routine x 2)     Status: None   Collection Time: 10/16/24 11:58 PM   Specimen: BLOOD  Result Value Ref Range Status   Specimen Description   Final    BLOOD RIGHT ANTECUBITAL Performed at [facility], [address] [place], [place] [address]    Special Requests   Final    BOTTLES DRAWN AEROBIC AND ANAEROBIC Blood Culture results may not be optimal due to an inadequate volume of blood received in culture bottles Performed at [facility], [address] [place], [place] [address]    Culture   Final    NO GROWTH 5 DAYS Performed at [facility], [address], [place], [place] [address]    Report Status 10/22/2024 FINAL  Final  Blood culture (routine x 2)     Status: None   Collection Time: 10/16/24 11:58 PM   Specimen: BLOOD  Result Value Ref Range Status   Specimen Description   Final    BLOOD LEFT ANTECUBITAL Performed at [facility], [address], [place], [place] [address]    Special Requests  Final    BOTTLES DRAWN AEROBIC AND ANAEROBIC Blood Culture results may not be optimal due to an inadequate volume of blood received in culture bottles Performed at [facility], [address], [place], [place] [address]    Culture   Final    NO GROWTH 5 DAYS Performed at [facility], [address] [place], [place] [address]    Report Status 10/22/2024 FINAL  Final     Labs: Basic Metabolic Panel: Recent Labs  Lab 10/17/24 0622 10/18/24 0234 10/18/24 1912 10/19/24 0253 10/20/24 0313 10/20/24 1050 10/20/24 1054 10/20/24 1055 10/20/24 1100 10/21/24 0328 10/22/24 0344  NA  --    < > 130* 130* 134*   < > 137 134* 136 134* 132*  K  --    < > 4.0 4.8 4.2   < > 3.9 3.9 3.9 4.1 3.7  CL  --    < > 96* 98 104  --   --   --   --  105 105  CO2  --    < > 21* 22 20*  --   --   --   --  17* 17*  GLUCOSE  --    < > 241* 279* 166*  --   --   --   --  120* 123*  BUN  --    < > 53* 58* 58*  --   --   --   --  54* 48*  CREATININE  --    < > 2.49* 2.64* 2.44*  --   --   --   --  2.11* 1.92*  CALCIUM   --    < > 8.4* 8.7* 8.5*  --   --   --   --  8.4* 7.9*  MG 2.0  --   --  2.0  --   --   --   --   --   --  2.1   < > = values in this interval not displayed.   Liver Function Tests: Recent Labs  Lab 10/16/24 1931 10/18/24 1912 10/22/24 0344  AST 17 21 13*  ALT 9 13 13   ALKPHOS 142* 92 59  BILITOT 0.6 0.8 1.0  PROT 8.4* 7.0 5.6*  ALBUMIN 4.4 3.2* 2.4*    CBC: Recent Labs  Lab 10/18/24 0234 10/19/24 0253 10/20/24 0313 10/20/24 1050 10/20/24 1054 10/20/24 1055 10/20/24 1100 10/21/24 0328 10/22/24 0344  WBC 13.1* 10.8* 10.7*  --   --   --   --  11.4* 12.7*  HGB 13.1 13.3 13.0   < > 11.9* 11.6* 12.2 11.2* 10.3*  HCT 41.2 41.5 39.6   < > 35.0* 34.0* 36.0 34.2* 31.3*  MCV 90.4 89.1 87.8  --   --   --   --  87.7 87.4  PLT 190 202 181  --   --   --   --  146* 138*   < > = values in this interval not displayed.   CBG: Recent Labs  Lab 10/21/24 0618 10/21/24 1100 10/21/24 1555 10/21/24 2107 10/22/24 0632  GLUCAP 131* 183* 128* 160* 113*   Hgb A1c No results for input(s): HGBA1C in the last 72 hours. Lipid Profile No results for input(s): CHOL, HDL, LDLCALC, TRIG, CHOLHDL, LDLDIRECT in the last 72 hours. Thyroid  function studies No results for input(s): TSH, T4TOTAL, T3FREE, THYROIDAB in the last 72 hours.  Invalid input(s): FREET3 Urinalysis    Component Value Date/Time   COLORURINE YELLOW  10/16/2024 1931   APPEARANCEUR HAZY (A) 10/16/2024 1931   LABSPEC 1.016 10/16/2024 1931   LABSPEC 1.010 11/09/2022 1211   PHURINE 5.0 10/16/2024 1931   GLUCOSEU 500 (A) 10/16/2024 1931   HGBUR TRACE (A) 10/16/2024 1931   BILIRUBINUR NEGATIVE 10/16/2024 1931   BILIRUBINUR negative 05/11/2024 1522   KETONESUR 15 (A) 10/16/2024 1931   PROTEINUR 30 (A) 10/16/2024 1931   UROBILINOGEN 0.2 05/11/2024 1522   UROBILINOGEN 1.0 10/17/2009 1021   NITRITE NEGATIVE 10/16/2024 1931   LEUKOCYTESUR MODERATE (A) 10/16/2024 1931    FURTHER DISCHARGE INSTRUCTIONS:   Get Medicines reviewed and adjusted: Please take all your medications with you for your next visit with your Primary MD   Laboratory/radiological data: Please request your Primary MD to go over all hospital tests and procedure/radiological results at the follow up, please ask your Primary MD to get all Hospital records sent to his/her office.   In some  cases, they will be blood work, cultures and biopsy results pending at the time of your discharge. Please request that your primary care M.D. goes through all the records of your hospital data and follows up on these results.   Also Note the following: If you experience worsening of your admission symptoms, develop shortness of breath, life threatening emergency, suicidal or homicidal thoughts you must seek medical attention immediately by calling 911 or calling your MD immediately  if symptoms less severe.   You must read complete instructions/literature along with all the possible adverse reactions/side effects for all the Medicines you take and that have been prescribed to you. Take any new Medicines after you have completely understood and accpet all the possible adverse reactions/side effects.    Do not drive when taking Pain medications or sleeping medications (Benzodaizepines)   Do not take more than prescribed Pain, Sleep and Anxiety Medications. It is not advisable to combine anxiety,sleep and pain medications without talking with your primary care practitioner   Special Instructions: If you have smoked or chewed Tobacco  in the last 2 yrs please stop smoking, stop any regular Alcohol  and or any Recreational drug use.   Wear Seat belts while driving.   Please note: You were cared for by a hospitalist during your hospital stay. Once you are discharged, your primary care physician will handle any further medical issues. Please note that NO REFILLS for any discharge medications will be authorized once you are discharged, as it is imperative that you return to your primary care physician (or establish a relationship with a primary care physician if you do not have one) for your post hospital discharge needs so that they can reassess your need for medications and monitor your lab values.  Time coordinating discharge: 35 minutes  SIGNED:  Nilda Fendt, MD, PhD 10/22/2024, 11:45 AM

## 2024-10-22 NOTE — Progress Notes (Addendum)
 Progress Note  Patient Name: Maria Foster Date of Encounter: 10/22/2024 [facility] Cardiologist: Annabella Scarce, MD   Interval Summary    Feeling well, hopeful to DC home.   Vital Signs Vitals:   10/21/24 2313 10/22/24 0346 10/22/24 0634 10/22/24 0737  BP: (!) 127/54 (!) 114/56  130/65  Pulse: 88 84  86  Resp: 20 20 13 14   Temp: 99.2 F (37.3 C) 98.8 F (37.1 C)  99.2 F (37.3 C)  TempSrc: Oral Oral  Oral  SpO2: 93% 94%  94%  Weight:   72.8 kg     Intake/Output Summary (Last 24 hours) at 10/22/2024 1026 Last data filed at 10/22/2024 0815 Gross per 24 hour  Intake 480 ml  Output --  Net 480 ml      10/22/2024    6:34 AM 10/21/2024    5:19 AM 10/20/2024    6:28 AM  Last 3 Weights  Weight (lbs) 160 lb 8 oz 161 lb 11.2 oz 158 lb 8.2 oz  Weight (kg) 72.802 kg 73.347 kg 71.9 kg      Telemetry/ECG   Sinus Rhythm - Personally Reviewed  Physical Exam  GEN: No acute distress.   Neck: No JVD Cardiac: RRR, no murmurs, rubs, or gallops.  Respiratory: Clear to auscultation bilaterally. GI: Soft, nontender, non-distended  MS: No edema  Assessment & Plan   68 year old woman with severe multivessel CAD and history of HFrEF with normalized systolic function on medical therapy, returns with complaints of abdominal pain nausea and vomiting and is found to have worsening left ventricular systolic function with regional wall motion abnormalities primarily involving the LAD distribution. Additional medical problems include DM type II, HTN, OSA (not on CPAP), CKD 3 AA, PAD, rheumatoid arthritis.    NSTEMI CAD Suspected Takotsubo cardiomyopathy -- Cardiac catheterization 2022 with known CTO of RCA and high-grade disease of OM and circumflex treated medically -- hsTn [id number] -- Echo 11/30 with LVEF of 40%, severe hypokinesis of the LV, mid apical inferior septal wall and inferior wall -- Cardiac catheterization 12/2 with CTO of RCA with left-to-right collaterals  filling via PDA, 100% occlusion of lateral branch of first OM, segmental 80% mid AV groove circumflex lesion.  Recommendations for continued medical therapy. -- Continue aspirin , atorvastatin  40mg  daily -- GDMT: continue carvedilol  12.5 mg twice daily, hydralazine  to 75 mg 3 times daily, Imdur  60mg  daily. Entresto /spiro held with AKI; intolerant of SGLT2i   PVCs -- now resolved on telemetry -- continue coreg  12.5mg  BID    AKI on CKD 3a -- Cr 1.3 on admission, peaked at 2.64, now improved to 1.92   Abdominal pain Nausea-vomiting -- CT with possible colitis, no SBO; improved   Will arrange outpatient follow up  For questions or updates, please contact [facility] Please consult [email or web address] for contact info under      Signed, Manuelita Rummer, NP   ATTENDING ATTESTATION:  After conducting a review of all available clinical information with the care team, interviewing the patient, and performing a physical exam, I agree with the findings and plan described in this note.   GEN: No acute distress, AO x 3 HEENT:  MMM, no JVD, no scleral icterus Cardiac: RRR, no murmurs, rubs, or gallops.  Respiratory: Clear to auscultation bilaterally. GI: Soft, nontender, non-distended  MS: No edema; No deformity. Neuro:  Nonfocal  Vasc:  +2 radial pulses  Patient doing well after starting goal-directed medical therapy for Takotsubo cardiomyopathy.  Creatinine is still modestly elevated and  so we will defer reinstituting Entresto  and spironolactone  for now we will plan this as an outpatient.  Patient is ambulating without issues.  She denies any shortness of breath.  She is euvolemic on exam.  Okay for discharge from cardiovascular perspective.  Will arrange cardiology follow-up.  Will sign off.  Call with questions.  Lurena Red, MD Pager [phone]

## 2024-10-23 ENCOUNTER — Telehealth: Payer: Self-pay

## 2024-10-23 NOTE — Transitions of Care (Post Inpatient/ED Visit) (Signed)
   10/23/2024  Name: Maria Foster MRN: [id number] DOB: [birth year]  Today's TOC FU Call Status: Today's TOC FU Call Status:: Unsuccessful Call (1st Attempt) Unsuccessful Call (1st Attempt) Date: 10/23/24  Attempted to reach the patient regarding the most recent Inpatient/ED visit.  Follow Up Plan: Additional outreach attempts will be made to reach the patient to complete the Transitions of Care (Post Inpatient/ED visit) call.   Arvin Seip RN, BSN, CCM [employer], Population Health Case Manager Phone: [phone]

## 2024-10-26 ENCOUNTER — Telehealth (HOSPITAL_COMMUNITY): Payer: Self-pay

## 2024-10-26 ENCOUNTER — Telehealth (HOSPITAL_COMMUNITY): Payer: Self-pay | Admitting: *Deleted

## 2024-10-26 DIAGNOSIS — I214 Non-ST elevation (NSTEMI) myocardial infarction: Secondary | ICD-10-CM

## 2024-10-26 NOTE — Telephone Encounter (Signed)
Called patient to see if she is interested in the Cardiac Rehab Program. Patient expressed interest. Explained scheduling process and went over insurance process, patient verbalized understanding. Will contact patient for scheduling once f/u has been completed. °

## 2024-10-26 NOTE — Telephone Encounter (Signed)
 Called pt and discussed Takotsubo/MI, increasing walking daily, daily wts, low sodium and low carb diet, and CRPII. Pt voiced reception. She sts she is weighing daily and beginning to walk. She is avoiding sodium. She is interested in CRPII at [facility].

## 2024-11-09 ENCOUNTER — Ambulatory Visit (HOSPITAL_BASED_OUTPATIENT_CLINIC_OR_DEPARTMENT_OTHER): Admitting: Family

## 2024-11-09 ENCOUNTER — Encounter (HOSPITAL_BASED_OUTPATIENT_CLINIC_OR_DEPARTMENT_OTHER): Payer: Self-pay | Admitting: Family

## 2024-11-09 ENCOUNTER — Other Ambulatory Visit (HOSPITAL_COMMUNITY): Payer: Self-pay

## 2024-11-09 VITALS — BP 162/72 | HR 71 | Ht 63.0 in | Wt 157.0 lb

## 2024-11-09 DIAGNOSIS — I25118 Atherosclerotic heart disease of native coronary artery with other forms of angina pectoris: Secondary | ICD-10-CM | POA: Diagnosis not present

## 2024-11-09 DIAGNOSIS — I10 Essential (primary) hypertension: Secondary | ICD-10-CM | POA: Diagnosis not present

## 2024-11-09 DIAGNOSIS — I5042 Chronic combined systolic (congestive) and diastolic (congestive) heart failure: Secondary | ICD-10-CM | POA: Diagnosis not present

## 2024-11-09 DIAGNOSIS — E785 Hyperlipidemia, unspecified: Secondary | ICD-10-CM | POA: Diagnosis not present

## 2024-11-09 LAB — BASIC METABOLIC PANEL WITH GFR
BUN/Creatinine Ratio: 9 — ABNORMAL LOW (ref 12–28)
BUN: 10 mg/dL (ref 8–27)
CO2: 20 mmol/L (ref 20–29)
Calcium: 8.9 mg/dL (ref 8.7–10.3)
Chloride: 108 mmol/L — ABNORMAL HIGH (ref 96–106)
Creatinine, Ser: 1.09 mg/dL — ABNORMAL HIGH (ref 0.57–1.00)
Glucose: 136 mg/dL — ABNORMAL HIGH (ref 70–99)
Potassium: 3.8 mmol/L (ref 3.5–5.2)
Sodium: 142 mmol/L (ref 134–144)
eGFR: 55 mL/min/1.73 — ABNORMAL LOW

## 2024-11-09 LAB — CBC
Hematocrit: 32.7 % — ABNORMAL LOW (ref 34.0–46.6)
Hemoglobin: 10.2 g/dL — ABNORMAL LOW (ref 11.1–15.9)
MCH: 29.1 pg (ref 26.6–33.0)
MCHC: 31.2 g/dL — ABNORMAL LOW (ref 31.5–35.7)
MCV: 93 fL (ref 79–97)
Platelets: 225 x10E3/uL (ref 150–450)
RBC: 3.51 x10E6/uL — ABNORMAL LOW (ref 3.77–5.28)
RDW: 12.8 % (ref 11.7–15.4)
WBC: 8.7 x10E3/uL (ref 3.4–10.8)

## 2024-11-09 MED ORDER — HYDRALAZINE HCL 25 MG PO TABS
75.0000 mg | ORAL_TABLET | Freq: Three times a day (TID) | ORAL | 2 refills | Status: DC
  Filled 2024-11-09: qty 270, 30d supply, fill #0

## 2024-11-09 NOTE — Progress Notes (Signed)
 "  Advanced Hypertension Clinic Assessment:    Date:  11/09/2024   ID:  Maria Foster, DOB June 07, 1956, MRN [id number]  PCP:  Bulah Alm RAMAN, PA-C  Cardiologist:  Annabella Scarce, MD  Nephrologist:  Referring MD: Bulah Alm RAMAN, PA-C   CC: Hypertension  History of Present Illness:    Maria Foster is a 68 y.o. female with a hx of diabetes, hypertension, CKD 3, CAD, chronic systolic and diastolic heart failure, tobacco use, rheumatoid arthritis, renal artery stenosis,  prior tobacco use, OSA (did not tolerate CPAP), PAD here to follow up in the Advanced Hypertension Clinic.   Prior admission 11/2021 new onset heart failure echo LVEF 35-40% with moderate LV dysfunction and global hypokinesis with grade 2 diastolic dysfunction.  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  She had mild to moderate AI.  She was diuresed underwent LHC 12/2020 revealing three-vessel CAD and a totally occluded RCA with left-to-right collaterals.  She 80% mid circumflex stenosis and nonobstructive disease in the LAD, LVEF by LHC to 40%.  She was medically managed given her lack of angina.  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  Repeat echo 04/2021 LVEF 55%. TEE 09/2021 LVEF 55% with mild rheumatic mitral disease and mild to moderate MR. CT 10/2022 with normal adrenal glands (no evidence of pheochromocytoma). Renal duplex 03/18/23 with left renal artery with 1-59% stenosis. Repeat study in one year recommended.  BP control has been difficult with multiple medication changes and episodes of nonadherence.  At visit 03/17/24 Spironolactone  dose increased to 50mg  daily.  Last seen in Advanced Hypertension Clinic 06/23/24 by Kristin Alvstad, RPH. Her BP was uncontrolled and Carvedilol  increased to 25mg  BID.   Admitted 11/28-12/4/25 with acute on chronic CHF and NSTEMI with 3 vessel coronary artery disease recommended for medical management.  Takutsubo cardiomyopathy suspected, no clear cause noted. Entresto /diuretic held due to AKI. Discharge weight 160 lbs.   Presents today for follow up independently. Lives at home with her husband. She is gradually returning to activity and feels her energy and activity level are back to 50%. Reports her breathing is pretty good. No orthopnea, PND and notes her sleep is much better then it was. She tries to walk 30 minutes in her home each day. She has now also been walking at the store for exercise.  BP this morning at home 138. She checks her BP every other day and her weight daily. Reports her weight at home has been stable. Reports no lightheadedness, dizziness.   Right radial catheterization site is healing well. She does not think she is holding onto any excess fluid.   Previous antihypertensives: Amlodipine  Clonidine   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   OU progressive, will medically need to have cataract surgery with lens will implantation so as to allow for complete clearance of the vitreous cavity in anticipation of likely vitrectomy in the future for progressive PDR despite previous PRP as delineated by the E DRS series of reports   Diabetes mellitus type 2 with complications (HCC)    diagnosed probably 2012 per patient   Diabetic retinopathy (HCC)    Hypertension    Rheumatoid arthritis (HCC)    Septic olecranon bursitis of right elbow 07/11/2021    Past Surgical History:  Procedure Laterality Date   INCISION AND DRAINAGE ABSCESS N/A 07/11/2021   Procedure:  INCISION AND DRAINAGE RIGHT ELBOW;  Surgeon: Ernie Cough, MD;  Location: [facility] ORS;  Service: Orthopedics;  Laterality: N/A;   PRP Bilateral OD - 07/23/2017 OS - 08/06/2017   RIGHT/LEFT HEART CATH AND CORONARY ANGIOGRAPHY N/A 01/05/2021   Procedure: RIGHT/LEFT HEART CATH AND CORONARY ANGIOGRAPHY;  Surgeon: Cherrie Toribio SAUNDERS, MD;  Location: [facility]  INVASIVE CV LAB;  Service: Cardiovascular;  Laterality: N/A;   RIGHT/LEFT HEART CATH AND CORONARY ANGIOGRAPHY N/A 10/20/2024   Procedure: RIGHT/LEFT HEART CATH AND CORONARY ANGIOGRAPHY;  Surgeon: Anner Alm ORN, MD;  Location: [facility] INVASIVE CV LAB;  Service: Cardiovascular;  Laterality: N/A;   TEE WITHOUT CARDIOVERSION N/A 10/09/2021   Procedure: TRANSESOPHAGEAL ECHOCARDIOGRAM (TEE);  Surgeon: Cherrie Toribio SAUNDERS, MD;  Location: [facility] ENDOSCOPY;  Service: Cardiovascular;  Laterality: N/A;    Current Medications: Current Meds  Medication Sig   aspirin  EC 81 MG tablet Take 1 tablet (81 mg total) by mouth daily.   atorvastatin  (LIPITOR) 40 MG tablet Take 1 tablet (40 mg total) by mouth daily.   carvedilol  (COREG ) 12.5 MG tablet Take 1 tablet (12.5 mg total) by mouth 2 (two) times daily.   glipiZIDE  (GLUCOTROL ) 5 MG tablet Take 1 tablet (5 mg total) by mouth 2 (two) times daily before a meal.   hydrALAZINE  (APRESOLINE ) 25 MG tablet Take 3 tablets (75 mg total) by mouth 3 (three) times daily.   isosorbide  mononitrate (IMDUR ) 60 MG 24 hr tablet Take 1 tablet (60 mg total) by mouth daily.   latanoprost (XALATAN) 0.005 % ophthalmic solution Place 1 drop into both eyes at bedtime.   linagliptin  (TRADJENTA ) 5 MG TABS tablet Take 1 tablet (5 mg total) by mouth daily.   methocarbamol  (ROBAXIN ) 500 MG tablet Take 1 tablet (500 mg total) by mouth at bedtime as needed for muscle spasms.   Vitamin D , Ergocalciferol , (DRISDOL ) 1.25 MG (50000 UNIT) CAPS capsule Take 1 capsule (50,000 Units total) by mouth every 7 (seven) days.     Allergies:   Metformin  and related, Amoxicillin , and Farxiga  [dapagliflozin ]   Social History   Socioeconomic History   Marital status: Married    Spouse name: Not on file   Number of children: Not on file   Years of education: Not on file   Highest education level: Not on file  Occupational History   Occupation: retired  Tobacco Use   Smoking status: Former    Current  packs/day: 0.00    Average packs/day: 0.5 packs/day for 15.0 years (7.5 ttl pk-yrs)    Types: Cigarettes    Start date: 09/2007    Quit date: 09/2022    Years since quitting: 2.1    Passive exposure: Past   Smokeless tobacco: Never   Tobacco comments:    Quit 10/28/2022  Vaping Use   Vaping status: Never Used  Substance and Sexual Activity   Alcohol use: Not Currently   Drug use: No   Sexual activity: Not on file  Other Topics Concern   Not on file  Social History Narrative   Not on file   Social Drivers of Health   Tobacco Use: Medium Risk (11/09/2024)   Patient History    Smoking Tobacco Use: Former    Smokeless Tobacco Use: Never    Passive Exposure: Past  Physicist, Medical Strain: Low Risk (12/10/2023)   Overall Financial Resource Strain (CARDIA)    Difficulty of Paying Living Expenses: Not hard at all  Food Insecurity: No Food Insecurity (10/17/2024)   Epic    Worried About  Running Out of Food in the Last Year: Never true    Ran Out of Food in the Last Year: Never true  Transportation Needs: No Transportation Needs (10/17/2024)   Epic    Lack of Transportation (Medical): No    Lack of Transportation (Non-Medical): No  Physical Activity: Insufficiently Active (12/10/2023)   Exercise Vital Sign    Days of Exercise per Week: 7 days    Minutes of Exercise per Session: 20 min  Stress: No Stress Concern Present (12/10/2023)   [employer] of Occupational Health - Occupational Stress Questionnaire    Feeling of Stress : Not at all  Social Connections: Moderately Integrated (10/19/2024)   Social Connection and Isolation Panel    Frequency of Communication with Friends and Family: More than three times a week    Frequency of Social Gatherings with Friends and Family: More than three times a week    Attends Religious Services: Never    Database Administrator or Organizations: Yes    Attends Banker Meetings: 1 to 4 times per year    Marital Status:  Married  Depression (PHQ2-9): Low Risk (08/07/2024)   Depression (PHQ2-9)    PHQ-2 Score: 0  Alcohol Screen: Low Risk (12/10/2023)   Alcohol Screen    Last Alcohol Screening Score (AUDIT): 0  Housing: Low Risk (10/19/2024)   Epic    Unable to Pay for Housing in the Last Year: No    Number of Times Moved in the Last Year: 0    Homeless in the Last Year: No  Utilities: Not At Risk (10/17/2024)   Epic    Threatened with loss of utilities: No  Health Literacy: Adequate Health Literacy (07/24/2024)   B1300 Health Literacy    Frequency of need for help with medical instructions: Rarely     Family History: The patient's family history includes Diabetes in her mother; Heart Problems in her sister; Heart attack in her mother; Heart failure in her mother, sister, and sister; Hypertension in her daughter and mother. There is no history of Amblyopia, Blindness, Cataracts, Glaucoma, Macular degeneration, Retinal detachment, Strabismus, or Retinitis pigmentosa.  ROS:   Please see the history of present illness.     All other systems reviewed and are negative.  EKGs/Labs/Other Studies Reviewed:         Recent Labs: 10/16/2024: Pro Brain Natriuretic Peptide 4,214.0 10/22/2024: ALT 13; BUN 48; Creatinine, Ser 1.92; Hemoglobin 10.3; Magnesium  2.1; Platelets 138; Potassium 3.7; Sodium 132   Recent Lipid Panel    Component Value Date/Time   CHOL 115 10/19/2024 0253   CHOL 124 12/16/2023 1205   TRIG 61 10/19/2024 0253   HDL 48 10/19/2024 0253   HDL 48 12/16/2023 1205   CHOLHDL 2.4 10/19/2024 0253   VLDL 12 10/19/2024 0253   LDLCALC 55 10/19/2024 0253   LDLCALC 63 12/16/2023 1205    Physical Exam:   VS:  BP (!) 162/72   Pulse 71   Ht 5' 3 (1.6 m)   Wt 157 lb (71.2 kg)   SpO2 97%   BMI 27.81 kg/m  , BMI Body mass index is 27.81 kg/m.  Vitals:   11/09/24 1419  BP: (!) 162/72  Pulse: 71  Height: 5' 3 (1.6 m)  Weight: 157 lb (71.2 kg)  SpO2: 97%  BMI (Calculated): 27.82     GENERAL:  Well appearing HEENT: Pupils equal round and reactive, fundi not visualized, oral mucosa unremarkable NECK:  No jugular venous distention, waveform within  normal limits, carotid upstroke brisk and symmetric, no bruits, no thyromegaly LYMPHATICS:  No cervical adenopathy LUNGS:  Clear to auscultation bilaterally HEART:  RRR.  PMI not displaced or sustained,S1 and S2 within normal limits, no S3, no S4, no clicks, no rubs, murmurs ABD:  Flat, positive bowel sounds normal in frequency in pitch, no bruits, no rebound, no guarding, no midline pulsatile mass, no hepatomegaly, no splenomegaly EXT:  2 plus pulses throughout, no edema, no cyanosis no clubbing SKIN:  No rashes no nodules NEURO:  Cranial nerves II through XII grossly intact, motor grossly intact throughout PSYCH:  Cognitively intact, oriented to person place and time   ASSESSMENT/PLAN:    Hypertension- BP not at goal in clinic but reports home readings 130's.  Entresto  and spironolactone  stopped during recent admission with AKI. BMP today for monitoring.  Continue coreg  12.5mg  BID, hydralazine  75mg  TID, Imdur  60mg  daily.  If BP persistently elevated and unable to resume Entresto , plan to increase Hydralazine  to 100mg  TID.    Chronic systolic and diastolic heart failure- Echo during recent admission LVEF 40% concern for takutsubo with LHC with stable three-vessel disease. Appears euvolemic on exam. NYHA: grade I-II. Repeat echo in 3 months to reassess LVEF Continue GDMT coreg  12.5mg  BID, Hydralazine  75mg  TID, Imdur  60mg  daily.  Entresto  , spiro stopped during recent admission, as above.  CAD / HLD, LDL goal <70 - Stable with no anginal symptoms. No indication for ischemic evaluation. LHC during recent admission stable three vessel disease recommended for medical management. R radial cath site healed appropriately with no ecchymosis.  GDMT: aspirin  EC 81 mg, atorvastatin  40 mg daily, carvedilol  12.5 mg BID, and Imdur  60 mg  daily.  Recommend aiming for 150 minutes of moderate intensity activity per week and following a heart healthy diet.    Screening for Secondary Hypertension:     02/20/2023    3:11 PM  Causes  Drugs/Herbals Screened     - Comments Limits sodium intake.  Quit smoking 2023.  Drinks 3 cups of coffee daily.  No NSAIDS  Renovascular HTN Screened     - Comments Check renal Dopplers  Sleep Apnea Screened     - Comments No symptoms  Thyroid  Disease Screened  Hyperaldosteronism Screened     - Comments Check renin and aldosterone  Pheochromocytoma N/A  Cushing's Syndrome N/A  Hyperparathyroidism Screened  Coarctation of the Aorta Screened     - Comments BP symmetric  Compliance Screened    Relevant Labs/Studies:    Latest Ref Rng & Units 10/22/2024    3:44 AM 10/21/2024    3:28 AM 10/20/2024   11:00 AM  Basic Labs  Sodium 135 - 145 mmol/L 132  134  136   Potassium 3.5 - 5.1 mmol/L 3.7  4.1  3.9   Creatinine 0.44 - 1.00 mg/dL 8.07  7.88         Latest Ref Rng & Units 12/21/2020   12:16 PM 10/18/2009    3:15 AM  Thyroid    TSH 0.350 - 4.500 uIU/mL 1.514  0.688 Test methodology is 3rd generation TSH                 03/18/2023   10:24 AM  Renovascular   Renal Artery US  Completed Yes       Disposition:    FU with MD/APP/PharmD in 12/2024   Medication Adjustments/Labs and Tests Ordered: Current medicines are reviewed at length with the patient today.  Concerns regarding medicines are outlined above.  No orders of the defined types were placed in this encounter.  No orders of the defined types were placed in this encounter.    Signed, Reche GORMAN Finder, NP  11/09/2024 2:37 PM    [facility] "

## 2024-11-09 NOTE — Patient Instructions (Addendum)
 Medication Instructions:  Continue your current medications.   *If you need a refill on your cardiac medications before your next appointment, please call your pharmacy*  Lab Work: Your physician recommends that you return for lab work today: BMP, CBC  If you have labs (blood work) drawn today and your tests are completely normal, you will receive your results only by: MyChart Message (if you have MyChart) OR A paper copy in the mail If you have any lab test that is abnormal or we need to change your treatment, we will call you to review the results.  Testing/Procedures: Your cardiac catheterization showed no new blockages - which is great!  Heart muscle was a little but weak on your echocardiogram in the hospital. A normal LVEF (LV ejection fraction) is 50-65% and yours was 40%. This is mildly reduced. Your Hydralazine , Carvedilol , Isosorbide  help to treat this.   Your physician has requested that you have an echocardiogram in 3 months. This will let us  reassess your heart muscle function. Echocardiography is a painless test that uses sound waves to create images of your heart. It provides your doctor with information about the size and shape of your heart and how well your hearts chambers and valves are working. This procedure takes approximately one hour. There are no restrictions for this procedure. Please do NOT wear cologne, perfume, aftershave, or lotions (deodorant is allowed). Please arrive 15 minutes prior to your appointment time.  Please note: We ask at that you not bring children with you during ultrasound (echo/ vascular) testing. Due to room size and safety concerns, children are not allowed in the ultrasound rooms during exams. Our front office staff cannot provide observation of children in our lobby area while testing is being conducted. An adult accompanying a patient to their appointment will only be allowed in the ultrasound room at the discretion of the ultrasound  technician under special circumstances. We apologize for any inconvenience.   Follow-Up: At [facility], you and your health needs are our priority.  As part of our continuing mission to provide you with exceptional heart care, our providers are all part of one team.  This team includes your primary Cardiologist (physician) and Advanced Practice Providers or APPs (Physician Assistants and Nurse Practitioners) who all work together to provide you with the care you need, when you need it.  Your next appointment:   Follow up in February 2026 with Kristin Alvstad, PharmD  We recommend signing up for the patient portal called MyChart.  Sign up information is provided on this After Visit Summary.  MyChart is used to connect with patients for Virtual Visits (Telemedicine).  Patients are able to view lab/test results, encounter notes, upcoming appointments, etc.  Non-urgent messages can be sent to your provider as well.   To learn more about what you can do with MyChart, go to [email or web address].   Other Instructions  If your blood pressure at home is always more than 140 for the top number, call me.   Recommend weighing daily and keeping a log. Please call our office if you have weight gain of 2 pounds overnight or 5 pounds in 1 week.   Date  Time Weight

## 2024-11-10 ENCOUNTER — Ambulatory Visit (HOSPITAL_BASED_OUTPATIENT_CLINIC_OR_DEPARTMENT_OTHER): Payer: Self-pay | Admitting: Family

## 2024-11-10 ENCOUNTER — Other Ambulatory Visit (HOSPITAL_COMMUNITY): Payer: Self-pay

## 2024-11-10 DIAGNOSIS — N1831 Chronic kidney disease, stage 3a: Secondary | ICD-10-CM

## 2024-11-10 DIAGNOSIS — I5042 Chronic combined systolic (congestive) and diastolic (congestive) heart failure: Secondary | ICD-10-CM

## 2024-11-10 DIAGNOSIS — Z79899 Other long term (current) drug therapy: Secondary | ICD-10-CM

## 2024-11-10 DIAGNOSIS — I10 Essential (primary) hypertension: Secondary | ICD-10-CM

## 2024-11-10 MED ORDER — SACUBITRIL-VALSARTAN 24-26 MG PO TABS: 1.0000 | ORAL_TABLET | Freq: Two times a day (BID) | ORAL | 4 refills | Status: DC

## 2024-11-10 MED ORDER — SACUBITRIL-VALSARTAN 24-26 MG PO TABS
1.0000 | ORAL_TABLET | Freq: Two times a day (BID) | ORAL | 4 refills | Status: AC
  Filled 2024-11-10: qty 60, 30d supply, fill #0
  Filled 2024-12-08: qty 60, 30d supply, fill #1

## 2024-11-10 NOTE — Telephone Encounter (Signed)
-----   Message from Reche Finder, NP sent at 11/10/2024  7:34 AM EST ----- Kidney function improving from previous. Sodium, CO2, and calcium  have normalized. This is a good result! To help strengthen kidney function and improve blood pressure, recommend resume Entresto  at  reduced dose of 24-26mg  BID. Repeat BMP in 2 weeks for monitoring.

## 2024-11-10 NOTE — Telephone Encounter (Signed)
 The patient has been notified of the result and verbalized understanding.  All questions (if any) were answered.  Pt aware we sent the rx for entresto  24-26 mg bid to [facility] and she will need to return for lab work to recheck a BMET in 2 weeks for monitoring.   Pt verbalized understanding and agrees with this plan.

## 2024-11-10 NOTE — Addendum Note (Signed)
 Addended by: GLADIS PORTER HERO on: 11/10/2024 04:11 PM   Modules accepted: Orders

## 2024-11-10 NOTE — Telephone Encounter (Signed)
 Left message for the pt to call back for results.  Will go ahead and place the repeat BMET in 2 weeks in the system, as well as send in an updated script of the pts Entresto  24-26 mg BID to her preferred Pharmacy on file.  Reche Finder, NP did send results and plan to the pts active mychart account to review.  Will monitor for review.

## 2024-11-11 ENCOUNTER — Other Ambulatory Visit (HOSPITAL_COMMUNITY): Payer: Self-pay

## 2024-11-11 ENCOUNTER — Other Ambulatory Visit: Payer: Self-pay

## 2024-11-26 LAB — BASIC METABOLIC PANEL WITH GFR
BUN/Creatinine Ratio: 14 (ref 12–28)
BUN: 15 mg/dL (ref 8–27)
CO2: 23 mmol/L (ref 20–29)
Calcium: 8.8 mg/dL (ref 8.7–10.3)
Chloride: 106 mmol/L (ref 96–106)
Creatinine, Ser: 1.06 mg/dL — ABNORMAL HIGH (ref 0.57–1.00)
Glucose: 132 mg/dL — ABNORMAL HIGH (ref 70–99)
Potassium: 4.1 mmol/L (ref 3.5–5.2)
Sodium: 142 mmol/L (ref 134–144)
eGFR: 57 mL/min/1.73 — ABNORMAL LOW

## 2024-11-27 ENCOUNTER — Other Ambulatory Visit (HOSPITAL_COMMUNITY): Payer: Self-pay

## 2024-11-27 MED ORDER — HYDRALAZINE HCL 25 MG PO TABS
75.0000 mg | ORAL_TABLET | Freq: Three times a day (TID) | ORAL | 2 refills | Status: AC
  Filled 2024-11-27: qty 270, 30d supply, fill #0

## 2024-11-27 NOTE — Addendum Note (Signed)
 Addended by: GLADIS PORTER HERO on: 11/27/2024 02:14 PM   Modules accepted: Orders

## 2024-11-27 NOTE — Telephone Encounter (Signed)
-----   Message from Larraine Salt sent at 11/27/2024  1:05 PM EST -----

## 2024-11-27 NOTE — Telephone Encounter (Signed)
 The patient has been notified of the result and verbalized understanding.  All questions (if any) were answered. Gladis Porter HERO, LPN 8/0/7973 7:86 PM    Vannie Reche RAMAN, NP to Maria Foster     11/26/24 10:31 AM Stable kidney function. Good result! Continue current medications and follow up as scheduled.

## 2024-11-27 NOTE — Telephone Encounter (Signed)
Pt returning call for results. Please advise.  

## 2024-12-28 ENCOUNTER — Ambulatory Visit (HOSPITAL_BASED_OUTPATIENT_CLINIC_OR_DEPARTMENT_OTHER): Admitting: Pharmacist Clinician (PhC)/ Clinical Pharmacy Specialist

## 2024-12-28 ENCOUNTER — Ambulatory Visit: Admitting: Pharmacist Clinician (PhC)/ Clinical Pharmacy Specialist

## 2025-01-12 ENCOUNTER — Ambulatory Visit: Admitting: Internal Medicine

## 2025-01-19 ENCOUNTER — Ambulatory Visit

## 2025-01-27 ENCOUNTER — Ambulatory Visit (HOSPITAL_COMMUNITY)
